# Patient Record
Sex: Female | Born: 1940 | Race: White | Hispanic: No | Smoking: Never smoker
Health system: Southern US, Community
[De-identification: ages and names within clinical notes are randomized; demographics above are authoritative.]

## PROBLEM LIST (undated history)

## (undated) DIAGNOSIS — Z973 Presence of spectacles and contact lenses: Secondary | ICD-10-CM

## (undated) DIAGNOSIS — E119 Type 2 diabetes mellitus without complications: Secondary | ICD-10-CM

## (undated) DIAGNOSIS — R519 Headache, unspecified: Secondary | ICD-10-CM

## (undated) DIAGNOSIS — K5909 Other constipation: Secondary | ICD-10-CM

## (undated) DIAGNOSIS — G8929 Other chronic pain: Secondary | ICD-10-CM

## (undated) DIAGNOSIS — N8110 Cystocele, unspecified: Secondary | ICD-10-CM

## (undated) DIAGNOSIS — J4540 Moderate persistent asthma, uncomplicated: Secondary | ICD-10-CM

## (undated) DIAGNOSIS — K449 Diaphragmatic hernia without obstruction or gangrene: Secondary | ICD-10-CM

## (undated) DIAGNOSIS — N812 Incomplete uterovaginal prolapse: Secondary | ICD-10-CM

## (undated) DIAGNOSIS — K219 Gastro-esophageal reflux disease without esophagitis: Secondary | ICD-10-CM

## (undated) DIAGNOSIS — R51 Headache: Secondary | ICD-10-CM

## (undated) DIAGNOSIS — M1990 Unspecified osteoarthritis, unspecified site: Secondary | ICD-10-CM

## (undated) DIAGNOSIS — Z9109 Other allergy status, other than to drugs and biological substances: Secondary | ICD-10-CM

## (undated) DIAGNOSIS — F32A Depression, unspecified: Secondary | ICD-10-CM

## (undated) DIAGNOSIS — S22080A Wedge compression fracture of T11-T12 vertebra, initial encounter for closed fracture: Secondary | ICD-10-CM

## (undated) DIAGNOSIS — J3089 Other allergic rhinitis: Secondary | ICD-10-CM

## (undated) DIAGNOSIS — J45909 Unspecified asthma, uncomplicated: Secondary | ICD-10-CM

## (undated) DIAGNOSIS — F329 Major depressive disorder, single episode, unspecified: Secondary | ICD-10-CM

## (undated) DIAGNOSIS — Z8669 Personal history of other diseases of the nervous system and sense organs: Secondary | ICD-10-CM

## (undated) DIAGNOSIS — T7840XA Allergy, unspecified, initial encounter: Secondary | ICD-10-CM

## (undated) DIAGNOSIS — E785 Hyperlipidemia, unspecified: Secondary | ICD-10-CM

## (undated) DIAGNOSIS — G43909 Migraine, unspecified, not intractable, without status migrainosus: Secondary | ICD-10-CM

## (undated) DIAGNOSIS — M8080XA Other osteoporosis with current pathological fracture, unspecified site, initial encounter for fracture: Secondary | ICD-10-CM

## (undated) DIAGNOSIS — J309 Allergic rhinitis, unspecified: Secondary | ICD-10-CM

## (undated) DIAGNOSIS — I10 Essential (primary) hypertension: Secondary | ICD-10-CM

## (undated) DIAGNOSIS — E039 Hypothyroidism, unspecified: Secondary | ICD-10-CM

## (undated) DIAGNOSIS — H3530 Unspecified macular degeneration: Secondary | ICD-10-CM

## (undated) HISTORY — DX: Gastro-esophageal reflux disease without esophagitis: K21.9

## (undated) HISTORY — PX: LAPAROSCOPY ABDOMEN DIAGNOSTIC: PRO50

## (undated) HISTORY — DX: Depression, unspecified: F32.A

## (undated) HISTORY — DX: Headache, unspecified: R51.9

## (undated) HISTORY — DX: Allergic rhinitis, unspecified: J30.9

## (undated) HISTORY — DX: Type 2 diabetes mellitus without complications: E11.9

## (undated) HISTORY — DX: Allergy, unspecified, initial encounter: T78.40XA

## (undated) HISTORY — DX: Unspecified osteoarthritis, unspecified site: M19.90

## (undated) HISTORY — DX: Hypothyroidism, unspecified: E03.9

## (undated) HISTORY — DX: Headache: R51

## (undated) HISTORY — PX: BACK SURGERY: SHX140

## (undated) HISTORY — DX: Unspecified asthma, uncomplicated: J45.909

## (undated) HISTORY — DX: Other osteoporosis with current pathological fracture, unspecified site, initial encounter for fracture: M80.80XA

## (undated) HISTORY — PX: COLONOSCOPY: SHX174

## (undated) HISTORY — DX: Other chronic pain: G89.29

## (undated) HISTORY — DX: Diaphragmatic hernia without obstruction or gangrene: K44.9

## (undated) HISTORY — DX: Essential (primary) hypertension: I10

## (undated) HISTORY — DX: Other allergy status, other than to drugs and biological substances: Z91.09

## (undated) HISTORY — DX: Hyperlipidemia, unspecified: E78.5

## (undated) HISTORY — DX: Major depressive disorder, single episode, unspecified: F32.9

## (undated) HISTORY — DX: Personal history of other diseases of the nervous system and sense organs: Z86.69

## (undated) HISTORY — PX: EYE SURGERY: SHX253

## (undated) HISTORY — DX: Wedge compression fracture of t11-T12 vertebra, initial encounter for closed fracture: S22.080A

---

## 1960-02-23 HISTORY — PX: TONSILLECTOMY AND ADENOIDECTOMY: SUR1326

## 1976-02-23 HISTORY — PX: DILATION AND CURETTAGE OF UTERUS: SHX78

## 1999-03-16 ENCOUNTER — Encounter: Payer: Self-pay | Admitting: Internal Medicine

## 1999-03-16 ENCOUNTER — Other Ambulatory Visit: Admission: RE | Admit: 1999-03-16 | Discharge: 1999-03-16 | Payer: Self-pay | Admitting: Internal Medicine

## 1999-03-16 ENCOUNTER — Encounter: Admission: RE | Admit: 1999-03-16 | Discharge: 1999-03-16 | Payer: Self-pay | Admitting: Internal Medicine

## 2000-03-17 ENCOUNTER — Encounter: Admission: RE | Admit: 2000-03-17 | Discharge: 2000-03-17 | Payer: Self-pay | Admitting: Internal Medicine

## 2000-03-17 ENCOUNTER — Encounter: Payer: Self-pay | Admitting: Internal Medicine

## 2000-10-21 ENCOUNTER — Encounter: Admission: RE | Admit: 2000-10-21 | Discharge: 2000-10-21 | Payer: Self-pay | Admitting: Internal Medicine

## 2000-10-21 ENCOUNTER — Encounter: Payer: Self-pay | Admitting: Internal Medicine

## 2001-02-22 HISTORY — PX: CATARACT EXTRACTION W/ INTRAOCULAR LENS IMPLANT: SHX1309

## 2001-02-22 HISTORY — PX: CATARACT EXTRACTION: SUR2

## 2001-03-27 ENCOUNTER — Encounter: Payer: Self-pay | Admitting: Internal Medicine

## 2001-03-27 ENCOUNTER — Encounter: Admission: RE | Admit: 2001-03-27 | Discharge: 2001-03-27 | Payer: Self-pay | Admitting: Internal Medicine

## 2002-02-22 DIAGNOSIS — E119 Type 2 diabetes mellitus without complications: Secondary | ICD-10-CM

## 2002-02-22 HISTORY — DX: Type 2 diabetes mellitus without complications: E11.9

## 2002-05-09 ENCOUNTER — Encounter: Admission: RE | Admit: 2002-05-09 | Discharge: 2002-05-09 | Payer: Self-pay | Admitting: Internal Medicine

## 2002-05-09 ENCOUNTER — Encounter: Payer: Self-pay | Admitting: Internal Medicine

## 2002-11-06 ENCOUNTER — Encounter: Admission: RE | Admit: 2002-11-06 | Discharge: 2003-02-04 | Payer: Self-pay | Admitting: Internal Medicine

## 2003-05-14 ENCOUNTER — Encounter: Admission: RE | Admit: 2003-05-14 | Discharge: 2003-05-14 | Payer: Self-pay | Admitting: Internal Medicine

## 2004-06-18 ENCOUNTER — Other Ambulatory Visit: Admission: RE | Admit: 2004-06-18 | Discharge: 2004-06-18 | Payer: Self-pay | Admitting: Internal Medicine

## 2004-07-03 ENCOUNTER — Encounter: Admission: RE | Admit: 2004-07-03 | Discharge: 2004-07-03 | Payer: Self-pay | Admitting: Internal Medicine

## 2005-08-05 ENCOUNTER — Encounter: Admission: RE | Admit: 2005-08-05 | Discharge: 2005-08-05 | Payer: Self-pay | Admitting: Internal Medicine

## 2006-09-23 ENCOUNTER — Encounter: Admission: RE | Admit: 2006-09-23 | Discharge: 2006-09-23 | Payer: Self-pay | Admitting: Internal Medicine

## 2007-06-06 ENCOUNTER — Other Ambulatory Visit: Admission: RE | Admit: 2007-06-06 | Discharge: 2007-06-06 | Payer: Self-pay | Admitting: Internal Medicine

## 2007-09-25 ENCOUNTER — Encounter: Admission: RE | Admit: 2007-09-25 | Discharge: 2007-09-25 | Payer: Self-pay | Admitting: Internal Medicine

## 2007-12-08 ENCOUNTER — Encounter: Admission: RE | Admit: 2007-12-08 | Discharge: 2007-12-08 | Payer: Self-pay | Admitting: Allergy and Immunology

## 2008-01-23 LAB — PULMONARY FUNCTION TEST

## 2008-05-23 HISTORY — PX: OTHER SURGICAL HISTORY: SHX169

## 2008-09-13 ENCOUNTER — Other Ambulatory Visit: Admission: RE | Admit: 2008-09-13 | Discharge: 2008-09-13 | Payer: Self-pay | Admitting: Internal Medicine

## 2008-11-15 ENCOUNTER — Encounter: Admission: RE | Admit: 2008-11-15 | Discharge: 2008-11-15 | Payer: Self-pay | Admitting: Internal Medicine

## 2010-07-30 LAB — LIPID PANEL
Cholesterol: 254 mg/dL — AB (ref 0–200)
Direct LDL: 170
HDL: 44 mg/dL (ref 35–70)
Triglycerides: 129

## 2010-12-16 LAB — VITAMIN D 25 HYDROXY (VIT D DEFICIENCY, FRACTURES): Vit D, 25-Hydroxy: 28.2

## 2010-12-16 LAB — COMPREHENSIVE METABOLIC PANEL
ALT: 18 U/L (ref 7–35)
AST: 20 U/L
Alkaline Phosphatase: 65 U/L
Total Bilirubin: 0.6 mg/dL

## 2010-12-16 LAB — TSH: TSH: 5.91

## 2010-12-16 LAB — FERRITIN: Ferritin: 57.1 ng/mL (ref 9.0–150.0)

## 2010-12-16 LAB — CBC
Hemoglobin: 14.9 g/dL (ref 12.0–16.0)
WBC: 5
platelet count: 146

## 2010-12-16 LAB — VITAMIN B12: Vitamin B12 Deficiency Panel: 496

## 2010-12-17 ENCOUNTER — Other Ambulatory Visit: Payer: Self-pay | Admitting: Family Medicine

## 2010-12-24 ENCOUNTER — Ambulatory Visit
Admission: RE | Admit: 2010-12-24 | Discharge: 2010-12-24 | Disposition: A | Discharge status: Home / Self Care | Payer: Medicare Other | Source: Ambulatory Visit | Attending: Family Medicine | Admitting: Family Medicine

## 2010-12-24 ENCOUNTER — Other Ambulatory Visit: Payer: Self-pay | Admitting: Family Medicine

## 2011-02-23 DIAGNOSIS — Z8719 Personal history of other diseases of the digestive system: Secondary | ICD-10-CM

## 2011-02-23 HISTORY — DX: Personal history of other diseases of the digestive system: Z87.19

## 2011-03-02 ENCOUNTER — Telehealth: Payer: Self-pay | Admitting: Internal Medicine

## 2011-03-16 NOTE — Telephone Encounter (Signed)
Just received these and they have been put on Dr Regino Schultze desk for review. I have advised [name] at [place] of this.

## 2011-04-02 ENCOUNTER — Encounter: Payer: Self-pay | Admitting: Internal Medicine

## 2011-04-21 LAB — HEMOGLOBIN A1C: A1c: 5.9

## 2011-04-21 LAB — GLUCOSE, FASTING: Glucose: 127

## 2011-04-21 LAB — CREATININE, SERUM: Creat: 0.96

## 2011-05-19 ENCOUNTER — Encounter: Payer: Self-pay | Admitting: *Deleted

## 2011-06-01 ENCOUNTER — Ambulatory Visit: Payer: Medicare Other | Admitting: Internal Medicine

## 2011-06-04 ENCOUNTER — Ambulatory Visit (INDEPENDENT_AMBULATORY_CARE_PROVIDER_SITE_OTHER): Payer: Medicare Other | Admitting: Internal Medicine

## 2011-06-04 ENCOUNTER — Encounter: Payer: Self-pay | Admitting: Internal Medicine

## 2011-06-04 VITALS — BP 132/68 | HR 60 | Ht 69.0 in | Wt 179.0 lb

## 2011-06-04 DIAGNOSIS — K219 Gastro-esophageal reflux disease without esophagitis: Secondary | ICD-10-CM

## 2011-06-04 DIAGNOSIS — R1319 Other dysphagia: Secondary | ICD-10-CM

## 2011-06-04 DIAGNOSIS — R933 Abnormal findings on diagnostic imaging of other parts of digestive tract: Secondary | ICD-10-CM

## 2011-06-04 NOTE — Progress Notes (Signed)
Jordan Patterson 10/12/1940 MRN [id number]    History of Present Illness:  This is a 70-year-old white female with chronic cough and difficulty swallowing, predominantly solids such as meat, bread and pasta. This has been going on for several years. She denies odynophagia. She is having food regurgitation at night and burning in her throat. She underwent an upper endoscopy by Jordan Patterson in October 2010 and it was a normal exam. A barium esophagram at that time showed blunted primary esophageal stripping, prominent tertiary contractions, also gastroesophageal reflux to the level of the upper thoracic esophagus. A 13 mm tablet lodged in the upper thoracic esophagus. She was at one point on Nexium 40 mg twice a day but discontinued it. She subsequently underwent an upper GI series in October 2012 which again showed a nonspecific esophageal motility disorder with occasional disruption of primary wave. There were no tertiary contractions. She had a small hiatal hernia and mild smooth narrowing at the GE junction. The 13 mm tablet passed this time without difficulty.   Past Medical History  Diagnosis Date  . Hiatal hernia   . GERD (gastroesophageal reflux disease)   . Hypertension   . Asthma   . Hyperlipidemia   . Depression   . Hypothyroidism   . Osteopenia   . Diabetes mellitus   . Chronic headaches    Past Surgical History  Procedure Date  . Dilation and curettage of uterus 1978  . Tonsillectomy and adenoidectomy 1962  . Laparoscopy abdomen diagnostic     To R/O endometriosis  . Cardiac catheterization     reports that she has never smoked. She has never used smokeless tobacco. She reports that she does not drink alcohol or use illicit drugs. family history includes Diabetes in her father; Hypertension in her mother; and Mitral valve prolapse in her father and mother.  There is no history of Colon cancer. Allergies  Allergen Reactions  . Ace Inhibitors Cough  . Amlodipine Cough  .  Lyrica   . Tegretol (Carbamazepine)     Dizziness, headache  . Wellbutrin (Bupropion Hcl)   . Cymbalta (Duloxetine Hcl) Palpitations    And headaches        Review of Systems: Denies shortness of breath worse meds. No change in bowel habits  The remainder of the 10 point ROS is negative except as outlined in H&P   Physical Exam: General appearance  Well developed, in no distress. Eyes- non icteric. HEENT nontraumatic, normocephalic. Normal voice occasional cough Mouth no lesions, tongue papillated, no cheilosis. Neck supple without adenopathy, thyroid not enlarged, no carotid bruits, no JVD. Lungs Clear to auscultation bilaterally. Cor normal S1, normal S2, regular rhythm, no murmur,  quiet precordium. Abdomen: Soft nontender with normoactive bowel sounds. No scars. Liver edge at costal margin. Rectal: Not done. Extremities no pedal edema. Skin no lesions. Neurological alert and oriented x 3. Psychological normal mood and affect.  Assessment and Plan:  Problem #1 Chronic low-grade dysphagia, predominantly to solids. Barium studies on 2 occasions showed esophageal dysmotility and most recently some tapering of the distal esophagus either due to spasm or mild esophageal stricture.The esophagram does not suggest achalasia She thinks that her symptoms are getting worse and more frequent. We will proceed with an upper endoscopy and esophageal dilation. We will obtain biopsies to rule out Barrett's esophagus. Some of her symptoms are suggestive of LPR. That may have to be evaluated as well. She may eventually have to go back on high-dose PPIs which she was   reluctant to take long-term. I have instructed her on antireflux measures.   06/04/2011 Jordan Patterson  

## 2011-06-04 NOTE — Patient Instructions (Signed)
You have been scheduled for an endoscopy with propofol. Please follow written instructions given to you at your visit today. CC: Dr Merri Brunette

## 2011-06-25 ENCOUNTER — Encounter (HOSPITAL_COMMUNITY)
Admission: RE | Disposition: A | Discharge status: Hospice | Payer: Self-pay | Source: Ambulatory Visit | Attending: Internal Medicine

## 2011-06-25 ENCOUNTER — Ambulatory Visit (HOSPITAL_COMMUNITY)
Admission: RE | Admit: 2011-06-25 | Discharge: 2011-06-25 | Disposition: A | Discharge status: Hospice | Payer: Medicare Other | Source: Ambulatory Visit | Attending: Internal Medicine | Admitting: Internal Medicine

## 2011-06-25 ENCOUNTER — Encounter (HOSPITAL_COMMUNITY): Payer: Self-pay | Admitting: *Deleted

## 2011-06-25 DIAGNOSIS — E785 Hyperlipidemia, unspecified: Secondary | ICD-10-CM | POA: Insufficient documentation

## 2011-06-25 DIAGNOSIS — R1310 Dysphagia, unspecified: Secondary | ICD-10-CM | POA: Insufficient documentation

## 2011-06-25 DIAGNOSIS — R1319 Other dysphagia: Secondary | ICD-10-CM

## 2011-06-25 DIAGNOSIS — J45909 Unspecified asthma, uncomplicated: Secondary | ICD-10-CM | POA: Insufficient documentation

## 2011-06-25 DIAGNOSIS — K449 Diaphragmatic hernia without obstruction or gangrene: Secondary | ICD-10-CM | POA: Insufficient documentation

## 2011-06-25 DIAGNOSIS — E039 Hypothyroidism, unspecified: Secondary | ICD-10-CM | POA: Insufficient documentation

## 2011-06-25 DIAGNOSIS — M949 Disorder of cartilage, unspecified: Secondary | ICD-10-CM | POA: Insufficient documentation

## 2011-06-25 DIAGNOSIS — K219 Gastro-esophageal reflux disease without esophagitis: Secondary | ICD-10-CM | POA: Insufficient documentation

## 2011-06-25 DIAGNOSIS — K224 Dyskinesia of esophagus: Secondary | ICD-10-CM | POA: Insufficient documentation

## 2011-06-25 DIAGNOSIS — M899 Disorder of bone, unspecified: Secondary | ICD-10-CM | POA: Insufficient documentation

## 2011-06-25 DIAGNOSIS — E119 Type 2 diabetes mellitus without complications: Secondary | ICD-10-CM | POA: Insufficient documentation

## 2011-06-25 DIAGNOSIS — I10 Essential (primary) hypertension: Secondary | ICD-10-CM | POA: Insufficient documentation

## 2011-06-25 HISTORY — DX: Unspecified macular degeneration: H35.30

## 2011-06-25 HISTORY — PX: SAVORY DILATION: SHX5439

## 2011-06-25 HISTORY — PX: ESOPHAGOGASTRODUODENOSCOPY: SHX5428

## 2011-06-25 SURGERY — EGD (ESOPHAGOGASTRODUODENOSCOPY)
Anesthesia: Moderate Sedation

## 2011-06-25 MED ORDER — MIDAZOLAM HCL 10 MG/2ML IJ SOLN
INTRAMUSCULAR | Status: DC | PRN
  Administered 2011-06-25 (×2): 2 mg via INTRAVENOUS

## 2011-06-25 MED ORDER — FENTANYL CITRATE 0.05 MG/ML IJ SOLN
INTRAMUSCULAR | Status: AC
  Filled 2011-06-25: qty 2

## 2011-06-25 MED ORDER — SODIUM CHLORIDE 0.9 % IV SOLN
INTRAVENOUS | Status: DC
  Administered 2011-06-25: 08:00:00 via INTRAVENOUS

## 2011-06-25 MED ORDER — MIDAZOLAM HCL 10 MG/2ML IJ SOLN
INTRAMUSCULAR | Status: AC
  Filled 2011-06-25: qty 2

## 2011-06-25 MED ORDER — BUTAMBEN-TETRACAINE-BENZOCAINE 2-2-14 % EX AERO
INHALATION_SPRAY | CUTANEOUS | Status: DC | PRN
  Administered 2011-06-25: 2 via TOPICAL

## 2011-06-25 MED ORDER — METOCLOPRAMIDE HCL 5 MG PO TABS: 5.0000 mg | ORAL_TABLET | Freq: Every evening | ORAL | Status: DC | PRN

## 2011-06-25 MED ORDER — FENTANYL NICU IV SYRINGE 50 MCG/ML
INJECTION | INTRAMUSCULAR | Status: DC | PRN
  Administered 2011-06-25 (×2): 25 ug via INTRAVENOUS

## 2011-06-25 NOTE — Op Note (Signed)
[facility] [address] [place], [place]  [address]  ENDOSCOPY PROCEDURE REPORT  PATIENT:  Jordan Patterson, Jordan Patterson  MR#:  [id number] BIRTHDATE:  Jan 27, 1941, 70 yrs. old  GENDER:  female  ENDOSCOPIST:  Hedwig Morton. [name], MD Referred by:  Merri Brunette, M.D.  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  The EG-[id number] ([id number]) endoscope was introduced through the mouth and advanced to the second portion of the duodenum, without limitations.  The instrument was slowly withdrawn as the mucosa was fully examined. <<PROCEDUREIMAGES>>  nl esophagus (see image7). no evidence of a stricture,  irregular Z-line. With standard forceps, a biopsy was obtained and sent to pathology (see image6). Savary dilation over a guidewire 17 mm dilator passed over the guidewire  Otherwise the examination was normal (see image1, image2, image3, image4, and image5). Retroflexed views revealed no abnormalities.    The scope was then withdrawn from the patient and the procedure completed.  COMPLICATIONS:  None  ENDOSCOPIC IMPRESSION: 1) Nl esophagus 2) Irregular Z-line 3) Otherwise normal examination s/p passage of 17 mm Savary dilator RECOMMENDATIONS: 1) Await biopsy results 2) Anti-reflux regimen to be follow dysphagia caused by dismotility and by reflux, will switch Prilosec to hs dose, also consider Reglan 5 mg hs  REPEAT EXAM:  In 0 year(s)  for.  ______________________________ Hedwig Morton. [name], MD  CC:  n. eSIGNED:   Hedwig Morton. Blonnie Maske at 06/25/2011 09:07 AM  [name], [phone]

## 2011-06-25 NOTE — H&P (View-Only) (Signed)
Jordan Patterson 1940/03/28 MRN [id number]    History of Present Illness:  This is a 71 year old white female with chronic cough and difficulty swallowing, predominantly solids such as meat, bread and pasta. This has been going on for several years. She denies odynophagia. She is having food regurgitation at night and burning in her throat. She underwent an upper endoscopy by Dr. Laural Benes in October 2010 and it was a normal exam. A barium esophagram at that time showed blunted primary esophageal stripping, prominent tertiary contractions, also gastroesophageal reflux to the level of the upper thoracic esophagus. A 13 mm tablet lodged in the upper thoracic esophagus. She was at one point on Nexium 40 mg twice a day but discontinued it. She subsequently underwent an upper GI series in October 2012 which again showed a nonspecific esophageal motility disorder with occasional disruption of primary wave. There were no tertiary contractions. She had a small hiatal hernia and mild smooth narrowing at the GE junction. The 13 mm tablet passed this time without difficulty.   Past Medical History  Diagnosis Date  . Hiatal hernia   . GERD (gastroesophageal reflux disease)   . Hypertension   . Asthma   . Hyperlipidemia   . Depression   . Hypothyroidism   . Osteopenia   . Diabetes mellitus   . Chronic headaches    Past Surgical History  Procedure Date  . Dilation and curettage of uterus 1978  . Tonsillectomy and adenoidectomy 1962  . Laparoscopy abdomen diagnostic     To R/O endometriosis  . Cardiac catheterization     reports that she has never smoked. She has never used smokeless tobacco. She reports that she does not drink alcohol or use illicit drugs. family history includes Diabetes in her father; Hypertension in her mother; and Mitral valve prolapse in her father and mother.  There is no history of Colon cancer. Allergies  Allergen Reactions  . Ace Inhibitors Cough  . Amlodipine Cough  .  Lyrica   . Tegretol (Carbamazepine)     Dizziness, headache  . Wellbutrin (Bupropion Hcl)   . Cymbalta (Duloxetine Hcl) Palpitations    And headaches        Review of Systems: Denies shortness of breath worse meds. No change in bowel habits  The remainder of the 10 point ROS is negative except as outlined in H&P   Physical Exam: General appearance  Well developed, in no distress. Eyes- non icteric. HEENT nontraumatic, normocephalic. Normal voice occasional cough Mouth no lesions, tongue papillated, no cheilosis. Neck supple without adenopathy, thyroid not enlarged, no carotid bruits, no JVD. Lungs Clear to auscultation bilaterally. Cor normal S1, normal S2, regular rhythm, no murmur,  quiet precordium. Abdomen: Soft nontender with normoactive bowel sounds. No scars. Liver edge at costal margin. Rectal: Not done. Extremities no pedal edema. Skin no lesions. Neurological alert and oriented x 3. Psychological normal mood and affect.  Assessment and Plan:  Problem #1 Chronic low-grade dysphagia, predominantly to solids. Barium studies on 2 occasions showed esophageal dysmotility and most recently some tapering of the distal esophagus either due to spasm or mild esophageal stricture.The esophagram does not suggest achalasia She thinks that her symptoms are getting worse and more frequent. We will proceed with an upper endoscopy and esophageal dilation. We will obtain biopsies to rule out Barrett's esophagus. Some of her symptoms are suggestive of LPR. That may have to be evaluated as well. She may eventually have to go back on high-dose PPIs which she was  reluctant to take long-term. I have instructed her on antireflux measures.   06/04/2011 Jordan Patterson

## 2011-06-25 NOTE — Discharge Instructions (Signed)
Start using Omeprazole at bedtime , also start taking Reglan 5 mg at bedtime  Elevate head of bed at night

## 2011-06-25 NOTE — Interval H&P Note (Signed)
History and Physical Interval Note:  06/25/2011 8:39 AM  Jordan Patterson  has presented today for surgery, with the diagnosis of gerd, dysphagia  The various methods of treatment have been discussed with the patient and family. After consideration of risks, benefits and other options for treatment, the patient has consented to  Procedure(s) (LRB): ESOPHAGOGASTRODUODENOSCOPY (EGD) (N/A) SAVORY DILATION (N/A) as a surgical intervention .  The patients' history has been reviewed, patient examined, no change in status, stable for surgery.  I have reviewed the patients' chart and labs.  Questions were answered to the patient's satisfaction.     [name]

## 2011-06-28 ENCOUNTER — Encounter (HOSPITAL_COMMUNITY): Payer: Self-pay | Admitting: Internal Medicine

## 2011-06-29 ENCOUNTER — Encounter: Payer: Self-pay | Admitting: Internal Medicine

## 2011-07-13 ENCOUNTER — Encounter: Payer: Self-pay | Admitting: *Deleted

## 2011-08-03 ENCOUNTER — Ambulatory Visit (INDEPENDENT_AMBULATORY_CARE_PROVIDER_SITE_OTHER): Payer: Medicare Other | Admitting: Internal Medicine

## 2011-08-03 ENCOUNTER — Encounter: Payer: Self-pay | Admitting: Internal Medicine

## 2011-08-03 VITALS — BP 132/64 | HR 60 | Ht 69.0 in | Wt 176.0 lb

## 2011-08-03 DIAGNOSIS — K219 Gastro-esophageal reflux disease without esophagitis: Secondary | ICD-10-CM

## 2011-08-03 NOTE — Progress Notes (Signed)
Jordan Patterson 03/08/1940 MRN [id number]  History of Present Illness:  This is a 71 year old white female with chronic gastroesophageal reflux and nonspecific esophageal dysmotility on a barium esophagram in 2010 as well as on a recent upper GI series done in October 2012. She just underwent an upper endoscopy and esophageal dilatation for dysphagia and was found to have an irregular Z line. Biopsies showed reflux related injury. There were a few eosinophils on the biopsies. She was dilated with a 17 mm dilator. She feels much improved today although not completely well. She takes Prilosec 20 mg every morning but has not tried Reglan 5 mg. There has been occasional dysphagia related to esophageal dysmotility.   Past Medical History  Diagnosis Date  . Hiatal hernia   . GERD (gastroesophageal reflux disease)   . Hypertension   . Asthma   . Hyperlipidemia   . Depression   . Hypothyroidism   . Osteopenia   . Diabetes mellitus   . Chronic headaches   . Macular degeneration    Past Surgical History  Procedure Date  . Dilation and curettage of uterus 1978  . Tonsillectomy and adenoidectomy 1962  . Laparoscopy abdomen diagnostic     To R/O endometriosis  . Esophagogastroduodenoscopy 06/25/2011    Procedure: ESOPHAGOGASTRODUODENOSCOPY (EGD);  Surgeon: Hart Carwin, MD;  Location: [facility];  Service: Endoscopy;  Laterality: N/A;  no Xray  . Savory dilation 06/25/2011    Procedure: SAVORY DILATION;  Surgeon: Hart Carwin, MD;  Location: [facility];  Service: Endoscopy;  Laterality: N/A;    reports that she has never smoked. She has never used smokeless tobacco. She reports that she does not drink alcohol or use illicit drugs. family history includes Diabetes in her father; Hypertension in her mother; and Mitral valve prolapse in her father and mother.  There is no history of Colon cancer. Allergies  Allergen Reactions  . Ace Inhibitors Cough  . Amlodipine Cough  . Pregabalin   .  Tegretol (Carbamazepine)     Dizziness, headache  . Wellbutrin (Bupropion Hcl)   . Cymbalta (Duloxetine Hcl) Palpitations    And headaches        Review of Systems: Negative for odynophagia or shortness of breath or chest pain  The remainder of the 10 point ROS is negative except as outlined in H&P   Physical Exam: General appearance  Well developed, in no distress. Psychological normal mood and affect.  Assessment and Plan:  Problem #1 Chronic gastroesophageal reflux disease with mild esophageal dysmotility and small hiatal hernia. There is no evidence of Barrett's esophagus. She is improved on Prilosec 20 mg daily. She will try Reglan before meals and continue on antireflux measures. We have discussed the possibility of Nissen fundoplication. She is not interested at this time. I suggest she increase Prilosec to 2 a day as needed. I would like to see her in 6 months.  Problem #2 Colorectal screening. I suggest a screening colonoscopy at her convenience.  08/03/2011 [name]

## 2011-08-03 NOTE — Patient Instructions (Addendum)
We have given you samples of Align. This puts good bacteria back into your colon. You should take 1 capsule by mouth once daily. If this works well for you, it can be purchased over the counter. CC: Dr Merri Brunette

## 2011-09-27 ENCOUNTER — Other Ambulatory Visit: Payer: Self-pay | Admitting: *Deleted

## 2011-09-27 MED ORDER — METOCLOPRAMIDE HCL 5 MG PO TABS: 5.0000 mg | ORAL_TABLET | Freq: Every evening | ORAL | Status: DC | PRN

## 2011-10-21 ENCOUNTER — Ambulatory Visit (INDEPENDENT_AMBULATORY_CARE_PROVIDER_SITE_OTHER): Payer: Medicare Other | Admitting: Family Medicine

## 2011-10-21 ENCOUNTER — Encounter: Payer: Self-pay | Admitting: Family Medicine

## 2011-10-21 ENCOUNTER — Ambulatory Visit: Payer: Medicare Other | Admitting: Internal Medicine

## 2011-10-21 VITALS — BP 142/90 | HR 76 | Temp 98.4°F | Ht 69.0 in | Wt 166.8 lb

## 2011-10-21 DIAGNOSIS — M8580 Other specified disorders of bone density and structure, unspecified site: Secondary | ICD-10-CM

## 2011-10-21 DIAGNOSIS — E785 Hyperlipidemia, unspecified: Secondary | ICD-10-CM

## 2011-10-21 DIAGNOSIS — M129 Arthropathy, unspecified: Secondary | ICD-10-CM

## 2011-10-21 DIAGNOSIS — M8080XA Other osteoporosis with current pathological fracture, unspecified site, initial encounter for fracture: Secondary | ICD-10-CM | POA: Insufficient documentation

## 2011-10-21 DIAGNOSIS — M899 Disorder of bone, unspecified: Secondary | ICD-10-CM

## 2011-10-21 DIAGNOSIS — E039 Hypothyroidism, unspecified: Secondary | ICD-10-CM

## 2011-10-21 DIAGNOSIS — F32A Depression, unspecified: Secondary | ICD-10-CM | POA: Insufficient documentation

## 2011-10-21 DIAGNOSIS — E1169 Type 2 diabetes mellitus with other specified complication: Secondary | ICD-10-CM | POA: Insufficient documentation

## 2011-10-21 DIAGNOSIS — I10 Essential (primary) hypertension: Secondary | ICD-10-CM

## 2011-10-21 DIAGNOSIS — F329 Major depressive disorder, single episode, unspecified: Secondary | ICD-10-CM

## 2011-10-21 DIAGNOSIS — K219 Gastro-esophageal reflux disease without esophagitis: Secondary | ICD-10-CM

## 2011-10-21 DIAGNOSIS — K449 Diaphragmatic hernia without obstruction or gangrene: Secondary | ICD-10-CM | POA: Insufficient documentation

## 2011-10-21 DIAGNOSIS — M1990 Unspecified osteoarthritis, unspecified site: Secondary | ICD-10-CM | POA: Insufficient documentation

## 2011-10-21 DIAGNOSIS — E119 Type 2 diabetes mellitus without complications: Secondary | ICD-10-CM | POA: Insufficient documentation

## 2011-10-21 MED ORDER — OLMESARTAN MEDOXOMIL-HCTZ 40-25 MG PO TABS: 1.0000 | ORAL_TABLET | Freq: Every day | ORAL | Status: DC

## 2011-10-21 NOTE — Assessment & Plan Note (Signed)
Sounds like diet controlled. Recheck when returns fasting prior to CPE.

## 2011-10-21 NOTE — Assessment & Plan Note (Signed)
Stable on omeprazole and align.  Continue.

## 2011-10-21 NOTE — Assessment & Plan Note (Signed)
Good control as evidenced by recent blood work. Have requested records from prior PCP. Check A1c next visit. ophtho exam 02/2011.  Foot exam today.

## 2011-10-21 NOTE — Patient Instructions (Signed)
Return at your convenience (approx 3 mo) fasting for blood work, afterwards for [employer] visit. Good to meet you today, call us with quesitons. We will request records from Dr. Katrinka Blazing.

## 2011-10-21 NOTE — Assessment & Plan Note (Signed)
Check TSH next blood draw. 

## 2011-10-21 NOTE — Assessment & Plan Note (Signed)
Chronic, stable. Continue med.  Shows great numbers at home, some lows.  Recently changed from coreg 6.25mg  bid to 3.125mg  bid.

## 2011-10-21 NOTE — Progress Notes (Signed)
Subjective:    Patient ID: Jordan Patterson, female    DOB: Mar 07, 1940, 71 y.o.   MRN: [id number]  HPI CC: new pt to establish  Prior saw Dr. Severiano Gilbert at [facility].  Wanted site closer to home.  DM - 02/2011 - Dr. Sherryle Lis eye exam.  Unsure last A1c.  Brings log of sugars ranging <120, most low 100s.  Pneumonia shot - doesn't think has received.  Does get flu shots.    HTN - benicar HCT and coreg.  Doing well.    Macular degeneration - [facility].  On [employer], losing a few pounds.  Preventative: Last CPE unsure, doesn't think had physical recently.  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  Past histories reviewed and updated if relevant as below. Patient Active Problem List  Diagnosis  . Esophageal reflux  . Other dysphagia   Past Medical History  Diagnosis Date  . Hiatal hernia   . GERD (gastroesophageal reflux disease)     ([place])  . Hypertension   . History of asthma   . Hyperlipidemia     mild, diet controlled  . Depression   . Hypothyroidism   . Osteopenia   . Diabetes type 2, controlled 2004    diet controlled  . Macular degeneration   . Arthritis     hands and knees  . Environmental allergies     dust,mold,mildew  . Hx of migraines   . Chronic headaches    Past Surgical History  Procedure Date  . Dilation and curettage of uterus 1978  . Tonsillectomy and adenoidectomy 1962  . Laparoscopy abdomen diagnostic     To R/O endometriosis  . Esophagogastroduodenoscopy 06/25/2011    Procedure: ESOPHAGOGASTRODUODENOSCOPY (EGD);  Surgeon: Hart Carwin, MD;  Location: [facility];  Service: Endoscopy;  Laterality: N/A;  no Xray  . Savory dilation 06/25/2011    Procedure: SAVORY DILATION;  Surgeon: Hart Carwin, MD;  Location: [facility];  Service: Endoscopy;  Laterality: N/A;  . Cataract extraction 2003    bilateral   History  Substance Use Topics  . Smoking status: Never Smoker   . Smokeless tobacco: Never Used  . Alcohol Use: No   Family History  Problem Relation Age of Onset  . Mitral valve prolapse Father   . Mitral valve prolapse Mother   . Hypertension Mother   . Diabetes Father   . Stroke Mother 73    after valve surgery  . Stroke Paternal Grandfather   . Coronary artery disease Maternal Grandfather   . Cancer Neg Hx    Allergies  Allergen Reactions  . Ace Inhibitors Cough  . Amlodipine Cough  . Pregabalin   . Tegretol (Carbamazepine)     Dizziness, headache  . Wellbutrin (Bupropion Hcl)   . Cymbalta (Duloxetine Hcl) Palpitations    And headaches   Current Outpatient Prescriptions on File Prior to Visit  Medication Sig Dispense Refill  . AMBULATORY NON FORMULARY MEDICATION [facility] As directed      . carvedilol (COREG) 6.25 MG tablet Take 3.125 mg by mouth 2 (two) times daily with a meal.       . Chlorpheniramine Maleate (ALLERGY PO) Take by mouth as directed.      . Cholecalciferol (VITAMIN D-3 PO) Take 4,000 mg by mouth  daily.      . Cinnamon 500 MG TABS Take 1 tablet by mouth 2 (two) times daily.       . Coenzyme Q10 (COQ10) 100 MG CAPS Take 1 capsule by mouth daily.      . cycloSPORINE (RESTASIS) 0.05 % ophthalmic emulsion Place 1 drop into both eyes 2 (two) times daily.       Marland Kitchen levothyroxine (SYNTHROID, LEVOTHROID) 88 MCG tablet Take 88 mcg by mouth daily.      Marland Kitchen loteprednol (LOTEMAX) 0.5 % ophthalmic suspension Apply 1 drop to eye as needed.       Marland Kitchen omeprazole (PRILOSEC OTC) 20 MG tablet Take 20 mg by mouth daily.      Gerarda Fraction Root 500 MG CAPS Take 1 capsule by mouth 2 (two) times daily.      Marland Kitchen DISCONTD: olmesartan-hydrochlorothiazide (BENICAR HCT) 40-25 MG per tablet Take 1 tablet by mouth daily.         Review of Systems  Constitutional:  Negative for fever, chills, activity change, appetite change, fatigue and unexpected weight change.  HENT: Negative for hearing loss and neck pain.   Eyes: Negative for visual disturbance.  Respiratory: Negative for cough, chest tightness, shortness of breath and wheezing.   Cardiovascular: Negative for chest pain, palpitations and leg swelling.  Gastrointestinal: Negative for nausea, vomiting, abdominal pain, diarrhea, constipation, blood in stool and abdominal distention.  Genitourinary: Negative for hematuria and difficulty urinating.  Musculoskeletal: Negative for myalgias and arthralgias.  Skin: Negative for rash.  Neurological: Negative for dizziness, seizures, syncope and headaches.  Hematological: Does not bruise/bleed easily.  Psychiatric/Behavioral: Negative for dysphoric mood. The patient is not nervous/anxious.        Objective:   Physical Exam  Nursing note and vitals reviewed. Constitutional: She is oriented to person, place, and time. She appears well-developed and well-nourished. No distress.  HENT:  Head: Normocephalic and atraumatic.  Right Ear: Hearing, tympanic membrane, external ear and ear canal normal.  Left Ear: Hearing, tympanic membrane, external ear and ear canal normal.  Nose: Nose normal.  Mouth/Throat: Oropharynx is clear and moist. No oropharyngeal exudate.  Eyes: Conjunctivae and EOM are normal. Pupils are equal, round, and reactive to light. No scleral icterus.  Neck: Normal range of motion. Neck supple. Carotid bruit is not present.  Cardiovascular: Normal rate, regular rhythm, normal heart sounds and intact distal pulses.   No murmur heard. Pulses:      Radial pulses are 2+ on the right side, and 2+ on the left side.  Pulmonary/Chest: Effort normal and breath sounds normal. No respiratory distress. She has no wheezes. She has no rales.  Abdominal: Soft. Bowel sounds are normal. She exhibits no distension and no mass. There is no tenderness. There is  no rebound and no guarding.  Musculoskeletal: Normal range of motion. She exhibits no edema.       Diabetic foot exam: Normal inspection No skin breakdown No calluses  Normal DP/PT pulses Normal sensation to light touch and monofilament Nails normal  Lymphadenopathy:    She has no cervical adenopathy.  Neurological: She is alert and oriented to person, place, and time.       CN grossly intact, station and gait intact  Skin: Skin is warm and dry. No rash noted.  Psychiatric: She has a normal mood and affect. Her behavior is normal. Judgment and thought content normal.       Assessment & Plan:

## 2011-10-21 NOTE — Assessment & Plan Note (Signed)
Takes vit D 4000 IU daily. Will request records from Dr. Katrinka Blazing, hopeful for DEXA there.

## 2011-11-07 ENCOUNTER — Encounter: Payer: Self-pay | Admitting: Family Medicine

## 2011-11-10 ENCOUNTER — Encounter: Payer: Self-pay | Admitting: Family Medicine

## 2011-11-15 ENCOUNTER — Other Ambulatory Visit: Payer: Self-pay | Admitting: *Deleted

## 2011-11-15 MED ORDER — LEVOTHYROXINE SODIUM 88 MCG PO TABS: 88.0000 ug | ORAL_TABLET | Freq: Every day | ORAL | Status: DC

## 2012-01-06 ENCOUNTER — Other Ambulatory Visit (INDEPENDENT_AMBULATORY_CARE_PROVIDER_SITE_OTHER): Payer: Medicare Other

## 2012-01-06 DIAGNOSIS — E785 Hyperlipidemia, unspecified: Secondary | ICD-10-CM

## 2012-01-06 DIAGNOSIS — E039 Hypothyroidism, unspecified: Secondary | ICD-10-CM

## 2012-01-06 DIAGNOSIS — E119 Type 2 diabetes mellitus without complications: Secondary | ICD-10-CM

## 2012-01-06 DIAGNOSIS — M8580 Other specified disorders of bone density and structure, unspecified site: Secondary | ICD-10-CM

## 2012-01-06 LAB — LIPID PANEL
Cholesterol: 277 mg/dL — ABNORMAL HIGH (ref 0–200)
HDL: 49.9 mg/dL (ref >39.00)
Total CHOL/HDL Ratio: 6
Triglycerides: 121 mg/dL (ref 0.0–149.0)
VLDL: 24.2 mg/dL (ref 0.0–40.0)

## 2012-01-06 LAB — LDL CHOLESTEROL, DIRECT: Direct LDL: 196.3 mg/dL

## 2012-01-06 LAB — TSH: TSH: 1.66 u[IU]/mL (ref 0.35–5.50)

## 2012-01-06 LAB — HEMOGLOBIN A1C: Hgb A1c MFr Bld: 6.2 % (ref 4.6–6.5)

## 2012-01-06 LAB — COMPREHENSIVE METABOLIC PANEL
ALT: 21 U/L (ref 0–35)
AST: 27 U/L (ref 0–37)
Albumin: 4.5 g/dL (ref 3.5–5.2)
Alkaline Phosphatase: 56 U/L (ref 39–117)
BUN: 25 mg/dL — ABNORMAL HIGH (ref 6–23)
CO2: 27 mEq/L (ref 19–32)
Calcium: 9.1 mg/dL (ref 8.4–10.5)
Chloride: 104 mEq/L (ref 96–112)
Creatinine, Ser: 0.9 mg/dL (ref 0.4–1.2)
GFR: 63.12 mL/min (ref >60.00)
Glucose, Bld: 132 mg/dL — ABNORMAL HIGH (ref 70–99)
Potassium: 4.8 mEq/L (ref 3.5–5.1)
Sodium: 140 mEq/L (ref 135–145)
Total Bilirubin: 0.5 mg/dL (ref 0.3–1.2)
Total Protein: 7.5 g/dL (ref 6.0–8.3)

## 2012-01-06 LAB — MICROALBUMIN / CREATININE URINE RATIO
Creatinine,U: 129.8 mg/dL
Microalb Creat Ratio: 0.9 mg/g (ref 0.0–30.0)
Microalb, Ur: 1.2 mg/dL (ref 0.0–1.9)

## 2012-01-07 LAB — VITAMIN D 25 HYDROXY (VIT D DEFICIENCY, FRACTURES): Vit D, 25-Hydroxy: 77 ng/mL (ref 30–89)

## 2012-01-14 ENCOUNTER — Encounter: Payer: Self-pay | Admitting: Family Medicine

## 2012-01-14 ENCOUNTER — Ambulatory Visit (INDEPENDENT_AMBULATORY_CARE_PROVIDER_SITE_OTHER): Payer: Medicare Other | Admitting: Family Medicine

## 2012-01-14 VITALS — BP 132/78 | HR 84 | Temp 98.1°F | Ht 68.0 in | Wt 169.0 lb

## 2012-01-14 DIAGNOSIS — E785 Hyperlipidemia, unspecified: Secondary | ICD-10-CM

## 2012-01-14 DIAGNOSIS — Z23 Encounter for immunization: Secondary | ICD-10-CM

## 2012-01-14 DIAGNOSIS — I10 Essential (primary) hypertension: Secondary | ICD-10-CM

## 2012-01-14 DIAGNOSIS — E119 Type 2 diabetes mellitus without complications: Secondary | ICD-10-CM

## 2012-01-14 DIAGNOSIS — E039 Hypothyroidism, unspecified: Secondary | ICD-10-CM

## 2012-01-14 DIAGNOSIS — M8580 Other specified disorders of bone density and structure, unspecified site: Secondary | ICD-10-CM

## 2012-01-14 DIAGNOSIS — Z0000 Encounter for general adult medical examination without abnormal findings: Secondary | ICD-10-CM

## 2012-01-14 NOTE — Assessment & Plan Note (Signed)
Poor control with diet.  Pt declines statin today.  Discussed importance of LDL control States she will think about statins in future.  Asks about coQ10.

## 2012-01-14 NOTE — Assessment & Plan Note (Signed)
Lab Results  Component Value Date   TSH 1.66 01/06/2012  good levothyroxine dose.

## 2012-01-14 NOTE — Patient Instructions (Addendum)
Call breast center to inquire about when next mammogram is due. Tetanus shot today. Strongly consider restarting crestor as your bad chol was too high.  Continue healthy diet. Decrease vitamin D to 2000 IU daily. Bring me a copy of living will. Return in 6 months to recheck blood pressure and reassess cough.

## 2012-01-14 NOTE — Assessment & Plan Note (Signed)
Well controlled. Continue treatment - likely prediabetic.

## 2012-01-14 NOTE — Assessment & Plan Note (Signed)
Chronic, stable. Self took off coreg, bp staying stable.  Continue only benicar hct.

## 2012-01-14 NOTE — Assessment & Plan Note (Signed)
rec decrease vit D to 2000 IU dialy.

## 2012-01-14 NOTE — Progress Notes (Signed)
Subjective:    Patient ID: Jordan Patterson, female    DOB: 05-29-1940, 71 y.o.   MRN: [id number]  HPI CC: medicare wellness visit  Continues working 3d/wk at [employer].  Currently with L conjunctival hemorrhage.  Wonders if slightly blurry from this.  Present for 2 days.  H/o macular degeneration and dry eyes.  Chronic cough for last several years.  HTN - stopped carvedilol because didn't feel needed.  Denies BP >140/90 at home.  Taking benicar hctz daily.    HLD - prior on crestor.  LDL 196.  DM - states good control of sugars.   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 Annia Friendly, per pt normal.   Mammogram - last in chart 2009.  Pt states done 2012.  Declines breast exam today. Pap - always normal pap smear/pelvic exams.  Prior received by PCP.  Requests deferred. Dexa 2010 osteopenia.  Taking vit D. Flu shot 11/2011 Pneumovax - declines Tetanus - today. Shingles shot - states had shingles.  Declines shingles shot. Advanced directives: has living will at home.  Daughter is HCPOA.  Denies depression/anxiety.  Sad because lonely after widowed.  Denies anhedonia. No falls in last year.  Hearing screen failed.  Has seen audiologist in past.  Did not need hearing aides at that time.  Told not severe hearing loss. Vision screen failed on L eye.  Medications and allergies reviewed and updated in chart.  Past histories reviewed and updated if relevant as below. Patient Active Problem List  Diagnosis  . Hiatal hernia  . GERD (gastroesophageal reflux disease)  . Hypertension  . Hyperlipidemia  . Depression  . Hypothyroidism  . Osteopenia  . Diabetes type 2, controlled  . Arthritis    Past Medical History  Diagnosis Date  . Hiatal hernia   . GERD (gastroesophageal reflux disease)     [name] EGD - mild esophageal dysmotility and small hiatal hernia  . Hypertension   . History of asthma   . Hyperlipidemia     mild, diet controlled  . Depression   . Hypothyroidism   . Osteopenia   . Diabetes type 2, controlled 2004    diet controlled  . Macular degeneration   . Arthritis     hands and knees  . Environmental allergies     dust,mold,mildew  . Hx of migraines   . Chronic headaches    Past Surgical History  Procedure Date  . Dilation and curettage of uterus 1978  . Tonsillectomy and adenoidectomy 1962  . Laparoscopy abdomen diagnostic     To R/O endometriosis  . Esophagogastroduodenoscopy 06/25/2011    Procedure: ESOPHAGOGASTRODUODENOSCOPY (EGD);  Surgeon: Hart Carwin, MD;  Location: [facility];  Service: Endoscopy;  Laterality: N/A;  no Xray  . Savory dilation 06/25/2011    Procedure: SAVORY DILATION;  Surgeon: Hart Carwin, MD;  Location: [facility];  Service: Endoscopy;  Laterality: N/A;  . Cataract extraction 2003    bilateral  . Dexa 2010    improved (initial DEXA mild osteopenia)  . Colonoscopy    History  Substance Use Topics  . Smoking status: Never Smoker   . Smokeless tobacco: Never Used  . Alcohol Use: No   Family History  Problem  Relation Age of Onset  . Mitral valve prolapse Father   . Mitral valve prolapse Mother   . Hypertension Mother   . Diabetes Father   . Stroke Mother 57    after valve surgery  . Stroke Paternal Grandfather   . Coronary artery disease Maternal Grandfather   . Cancer Neg Hx    Allergies  Allergen Reactions  . Ace Inhibitors Cough  . Amlodipine Cough  . Pregabalin   . Tegretol (Carbamazepine)     Dizziness, headache  . Wellbutrin (Bupropion Hcl)   . Cymbalta (Duloxetine Hcl) Palpitations    And headaches   Current Outpatient Prescriptions on File Prior to Visit  Medication Sig Dispense Refill   . AMBULATORY NON FORMULARY MEDICATION [facility] As directed      . Chlorpheniramine Maleate (ALLERGY PO) Take by mouth as directed.      . Cholecalciferol (VITAMIN D-3 PO) Take 4,000 mg by mouth daily.      . Cinnamon 500 MG TABS Take 1 tablet by mouth 2 (two) times daily.       . Coenzyme Q10 (COQ10) 100 MG CAPS Take 1 capsule by mouth daily.      . cycloSPORINE (RESTASIS) 0.05 % ophthalmic emulsion Place 1 drop into both eyes 2 (two) times daily.       Marland Kitchen levothyroxine (SYNTHROID, LEVOTHROID) 88 MCG tablet Take 1 tablet (88 mcg total) by mouth daily.  30 tablet  3  . loteprednol (LOTEMAX) 0.5 % ophthalmic suspension Apply 1 drop to eye as needed.       Marland Kitchen olmesartan-hydrochlorothiazide (BENICAR HCT) 40-25 MG per tablet Take 1 tablet by mouth daily.  30 tablet  11  . omeprazole (PRILOSEC OTC) 20 MG tablet Take 20 mg by mouth daily.      . Probiotic Product (ALIGN) 4 MG CAPS Take 1 capsule by mouth daily.      Gerarda Fraction Root 500 MG CAPS Take 1 capsule by mouth 2 (two) times daily.         Review of Systems  Constitutional: Negative for fever, chills, activity change, appetite change, fatigue and unexpected weight change.  HENT: Negative for hearing loss and neck pain.   Eyes: Negative for visual disturbance.  Respiratory: Negative for cough (chronic), chest tightness, shortness of breath and wheezing.   Cardiovascular: Negative for chest pain, palpitations and leg swelling.  Gastrointestinal: Negative for nausea, vomiting, abdominal pain, diarrhea, constipation, blood in stool and abdominal distention.  Genitourinary: Negative for hematuria and difficulty urinating.  Musculoskeletal: Negative for myalgias and arthralgias.  Skin: Negative for rash.  Neurological: Negative for dizziness, seizures, syncope and headaches.  Hematological: Negative for adenopathy.  Psychiatric/Behavioral: Negative for dysphoric mood. The patient is not nervous/anxious.        Objective:   Physical Exam   Nursing note and vitals reviewed. Constitutional: She is oriented to person, place, and time. She appears well-developed and well-nourished. No distress.  HENT:  Head: Normocephalic and atraumatic.  Right Ear: Hearing, tympanic membrane, external ear and ear canal normal.  Left Ear: Hearing, tympanic membrane, external ear and ear canal normal.  Nose: Nose normal.  Mouth/Throat: Oropharynx is clear and moist. No oropharyngeal exudate.  Eyes: Conjunctivae normal and EOM are normal. Pupils are equal, round, and reactive to light. No scleral icterus.  Neck: Normal range of motion. Neck supple. Carotid bruit is not present.  Cardiovascular: Normal rate, regular rhythm, normal heart sounds and intact distal pulses.   No murmur heard. Pulses:  Radial pulses are 2+ on the right side, and 2+ on the left side.  Pulmonary/Chest: Effort normal and breath sounds normal. No respiratory distress. She has no wheezes. She has no rales.       Breast exam - declined  Abdominal: Soft. Bowel sounds are normal. She exhibits no distension and no mass. There is no tenderness. There is no rebound and no guarding.  Genitourinary:       declined  Musculoskeletal: Normal range of motion. She exhibits no edema.  Lymphadenopathy:    She has no cervical adenopathy.  Neurological: She is alert and oriented to person, place, and time.       CN grossly intact, station and gait intact  Skin: Skin is warm and dry. No rash noted.  Psychiatric: She has a normal mood and affect. Her behavior is normal. Judgment and thought content normal.       Assessment & Plan:

## 2012-01-14 NOTE — Assessment & Plan Note (Signed)
,  I have personally reviewed the Medicare Annual Wellness questionnaire and have noted 1. The patient's medical and social history 2. Their use of alcohol, tobacco or illicit drugs 3. Their current medications and supplements 4. The patient's functional ability including ADL's, fall risks, home safety risks and hearing or visual impairment. 5. Diet and physical activity 6. Evidence for depression or mood disorders The patients weight, height, BMI have been recorded in the chart.  Hearing and vision has been addressed. I have made referrals, counseling and provided education to the patient based review of the above and I have provided the pt with a written personalized care plan for preventive services. See scanned questionairre. Advanced directives discussed: will bring me copy of living will.  HCPOA would be daughter.  Reviewed preventative protocols and updated unless pt declined.

## 2012-01-14 NOTE — Addendum Note (Signed)
Addended by: Josph Macho A on: 01/14/2012 04:17 PM   Modules accepted: Orders

## 2012-01-16 ENCOUNTER — Encounter: Payer: Self-pay | Admitting: Family Medicine

## 2012-01-21 ENCOUNTER — Ambulatory Visit (INDEPENDENT_AMBULATORY_CARE_PROVIDER_SITE_OTHER): Payer: Medicare Other | Admitting: Family Medicine

## 2012-01-21 ENCOUNTER — Encounter: Payer: Self-pay | Admitting: Family Medicine

## 2012-01-21 VITALS — BP 138/84 | HR 77 | Temp 98.0°F | Wt 169.2 lb

## 2012-01-21 DIAGNOSIS — J219 Acute bronchiolitis, unspecified: Secondary | ICD-10-CM

## 2012-01-21 DIAGNOSIS — J218 Acute bronchiolitis due to other specified organisms: Secondary | ICD-10-CM

## 2012-01-21 MED ORDER — AZITHROMYCIN 250 MG PO TABS: ORAL_TABLET | ORAL | Status: DC

## 2012-01-21 NOTE — Progress Notes (Signed)
Nature conservation officer at [facility] [address] [place] [address] Phone: [phone] Fax: [phone]  Date:  01/21/2012   Name:  Jordan Patterson   DOB:  [birth year]   MRN:  [id number] Gender: female Age: 71 y.o.  PCP:  Eustaquio Boyden, MD  Evaluating MD: Hannah Beat, MD   Chief Complaint: Cough   History of Present Illness:  Jordan Patterson is a 71 y.o. pleasant patient who presents with the following:  Coughing a lot, and started on sat evening and coughed all night on sat night. Tues got some mucinex might help a little, but does not really help a whole lot. Afebrile. No chest pain or shortness of breath. The patient has had a productive cough.  No nausea, vomiting, or diarrhea. No significant rash. No significant headache. No significant sore throat.   Patient Active Problem List  Diagnosis  . Hiatal hernia  . GERD (gastroesophageal reflux disease)  . Hypertension  . Hyperlipidemia  . Depression  . Hypothyroidism  . Osteopenia  . Diabetes type 2, controlled  . Arthritis  . Medicare annual wellness visit, subsequent    Past Medical History  Diagnosis Date  . Hiatal hernia   . GERD (gastroesophageal reflux disease)     [name] EGD - mild esophageal dysmotility and small hiatal hernia  . Hypertension   . History of asthma   . Hyperlipidemia     mild, diet controlled  . Depression   . Hypothyroidism   . Osteopenia   . Diabetes type 2, controlled 2004    diet controlled  . Macular degeneration   . Arthritis     hands and knees  . Environmental allergies     dust,mold,mildew  . Hx of migraines   . Chronic headaches     Past Surgical History  Procedure Date  . Dilation and curettage of uterus 1978  . Tonsillectomy and adenoidectomy 1962  . Laparoscopy abdomen diagnostic     To R/O endometriosis  . Esophagogastroduodenoscopy 06/25/2011    Procedure: ESOPHAGOGASTRODUODENOSCOPY (EGD);  Surgeon: Hart Carwin, MD;  Location: [facility];   Service: Endoscopy;  Laterality: N/A;  no Xray  . Savory dilation 06/25/2011    Procedure: SAVORY DILATION;  Surgeon: Hart Carwin, MD;  Location: [facility];  Service: Endoscopy;  Laterality: N/A;  . Cataract extraction 2003    bilateral  . Dexa 2010    improved (initial DEXA mild osteopenia)  . Colonoscopy     History  Substance Use Topics  . Smoking status: Never Smoker   . Smokeless tobacco: Never Used  . Alcohol Use: No    Family History  Problem Relation Age of Onset  . Mitral valve prolapse Father   . Mitral valve prolapse Mother   . Hypertension Mother   . Diabetes Father   . Stroke Mother 30    after valve surgery  . Stroke Paternal Grandfather   . Coronary artery disease Maternal Grandfather   . Cancer Neg Hx     Allergies  Allergen Reactions  . Ace Inhibitors Cough  . Amlodipine Cough  . Pregabalin   . Tegretol (Carbamazepine)     Dizziness, headache  . Wellbutrin (Bupropion Hcl)   . Cymbalta (Duloxetine Hcl) Palpitations    And headaches    Medication list has been reviewed and updated.  Outpatient Prescriptions Prior to Visit  Medication Sig Dispense Refill  . AMBULATORY NON FORMULARY MEDICATION [facility] As directed      .  Chlorpheniramine Maleate (ALLERGY PO) Take by mouth as directed.      . Cholecalciferol (VITAMIN D-3 PO) Take 4,000 mg by mouth daily.      . Cinnamon 500 MG TABS Take 1 tablet by mouth 2 (two) times daily.       . Coenzyme Q10 (COQ10) 100 MG CAPS Take 1 capsule by mouth daily.      . cycloSPORINE (RESTASIS) 0.05 % ophthalmic emulsion Place 1 drop into both eyes 2 (two) times daily.       Marland Kitchen levothyroxine (SYNTHROID, LEVOTHROID) 88 MCG tablet Take 1 tablet (88 mcg total) by mouth daily.  30 tablet  3  . loteprednol (LOTEMAX) 0.5 % ophthalmic suspension Apply 1 drop to eye as needed.       Marland Kitchen olmesartan-hydrochlorothiazide (BENICAR HCT) 40-25 MG per tablet Take 1 tablet by mouth daily.  30 tablet  11  . omeprazole (PRILOSEC OTC)  20 MG tablet Take 20 mg by mouth daily.      . Probiotic Product (ALIGN) 4 MG CAPS Take 1 capsule by mouth daily.      Gerarda Fraction Root 500 MG CAPS Take 1 capsule by mouth 2 (two) times daily.       Last reviewed on 01/21/2012 11:41 AM by Shon Millet, CMA  Review of Systems:  ROS: GEN: Acute illness details above GI: Tolerating PO intake GU: maintaining adequate hydration and urination Pulm: No SOB Interactive and getting along well at home.  Otherwise, ROS is as per the HPI.   Physical Examination: Filed Vitals:   01/21/12 1138  BP: 138/84  Pulse: 77  Temp: 98 F (36.7 C)  TempSrc: Oral  Weight: 169 lb 4 oz (76.771 kg)    There is no height on file to calculate BMI. Ideal Body Weight:     GEN: A and O x 3. WDWN. NAD.    ENT: Nose clear, ext NML.  No LAD.  No JVD.  TM's clear. Oropharynx clear.  PULM: Normal WOB, no distress. No crackles, wheezes, rhonchi. CV: RRR, no M/G/R, No rubs, No JVD.   EXT: warm and well-perfused, No c/c/e. PSYCH: Pleasant and conversant.   Assessment and Plan:  1. Bronchiolitis, acute     Acute bronchitis: discussed plan of care. Given length of symptoms and overall history, will treat with ABX in this case. Continue with additional supportive care, cough medications, liquids, sleep, steam / vaporizer.  Orders Today:  No orders of the defined types were placed in this encounter.    Updated Medication List: (Includes new medications, updates to list, dose adjustments) Meds ordered this encounter  Medications  . azithromycin (ZITHROMAX) 250 MG tablet    Sig: 2 tabs po on day 1, then 1 tab po for 4 days    Dispense:  6 tablet    Refill:  0    Medications Discontinued: There are no discontinued medications.   Hannah Beat, MD

## 2012-03-09 ENCOUNTER — Encounter: Payer: Self-pay | Admitting: Family Medicine

## 2012-03-09 ENCOUNTER — Ambulatory Visit (INDEPENDENT_AMBULATORY_CARE_PROVIDER_SITE_OTHER): Payer: Medicare Other | Admitting: Family Medicine

## 2012-03-09 VITALS — BP 132/80 | HR 76 | Temp 98.3°F | Wt 169.5 lb

## 2012-03-09 DIAGNOSIS — R05 Cough: Secondary | ICD-10-CM

## 2012-03-09 DIAGNOSIS — R059 Cough, unspecified: Secondary | ICD-10-CM

## 2012-03-09 DIAGNOSIS — J4540 Moderate persistent asthma, uncomplicated: Secondary | ICD-10-CM | POA: Insufficient documentation

## 2012-03-09 MED ORDER — FLUTICASONE PROPIONATE 50 MCG/ACT NA SUSP: 2.0000 | Freq: Every day | NASAL | Status: DC

## 2012-03-09 NOTE — Progress Notes (Signed)
  Subjective:    Patient ID: Jordan Patterson, female    DOB: 1940-02-29, 72 y.o.   MRN: [id number]  HPI CC: cough/allergies?  Several month history of cough.  1 wk ago suffered "allergy attack".  Lots of sneezing, blowing nose, rhinorrhea.  Clear mucous.  Mild nasal congestion.  Occasional productive cough mostly clear, some yellow sputum.  Cough possibly aggravated by food.  Not much PNDrainage.  No fevers/chills, abd pain, nausea/vomiting, indigestion, no endorsed heartburn.  No headache, ear or tooth pain, sinus pressure.  Has started taking zyrtec which helps.  Also taking mucinex DM twice daily.  Seen by Dr. Patsy Lager 6 wks ago with dx bronchitis and treated with zpack, unsure if helped.  On benicar HCT daily as well as omeprazole 20mg  daily for GERD, occasionally bid.  Told has silent reflux.  No sick contacts at home. No smokers at home. No h/o asthma.  Inhalers haven't helped in past for cough. H/o environmental allergies - dust, mold, pollen.  Significant second hand smoke exposure.  CXR done 2009 - hyperinflation.  Past Medical History  Diagnosis Date  . Hiatal hernia   . GERD (gastroesophageal reflux disease)     [name] EGD - mild esophageal dysmotility and small hiatal hernia  . Hypertension   . History of asthma   . Hyperlipidemia     mild, diet controlled  . Depression   . Hypothyroidism   . Osteopenia   . Diabetes type 2, controlled 2004    diet controlled  . Macular degeneration   . Arthritis     hands and knees  . Environmental allergies     dust,mold,mildew  . Hx of migraines   . Chronic headaches      Review of Systems Per HPI    Objective:   Physical Exam  Nursing note and vitals reviewed. Constitutional: She appears well-developed and well-nourished. No distress.  HENT:  Head: Normocephalic and atraumatic.  Right Ear: Hearing, external ear and ear canal normal.  Left Ear: Hearing, external ear and ear canal normal.  Nose: Mucosal edema and  rhinorrhea present.  Mouth/Throat: Uvula is midline, oropharynx is clear and moist and mucous membranes are normal. No oropharyngeal exudate, posterior oropharyngeal edema, posterior oropharyngeal erythema or tonsillar abscesses.       Evidently congested Cerumen in bilateral ear canals  Eyes: Conjunctivae normal and EOM are normal. Pupils are equal, round, and reactive to light. No scleral icterus.       Injected conjunctiva bilaterally  Neck: Normal range of motion. Neck supple.  Cardiovascular: Normal rate, regular rhythm, normal heart sounds and intact distal pulses.   No murmur heard. Pulmonary/Chest: Effort normal and breath sounds normal. No respiratory distress. She has no wheezes. She has no rales.  Lymphadenopathy:    She has no cervical adenopathy.  Skin: Skin is warm and dry. No rash noted.       Assessment & Plan:

## 2012-03-09 NOTE — Patient Instructions (Signed)
I think this is more an allergic cough - continue zyrtec, mucinex, and start nasal saline. I've prescribed nasal steroid and sent to your pharmacy.  watch for nose bleeds on this medicine. Increase omeprazole to 20mg  twice daily for a week to see if any improvement. Let me know if not improving for xray.

## 2012-03-09 NOTE — Assessment & Plan Note (Signed)
Anticipate allergy related.  Discussed this. Treat with INS and continued mucinex/zyrtec and start nasal saline. rec increase prilosec to 20mg  bid for next week to see if improvement with this (?GERD related). Alternatively consider chronic bronchitis (COPD) vs other. If not improved with this, return for CXR.  Pt agrees with plan. A total of 25 minutes were spent face-to-face with the patient during this encounter and over half of that time was spent on counseling and coordination of care

## 2012-03-10 ENCOUNTER — Ambulatory Visit: Payer: Medicare Other | Admitting: Family Medicine

## 2012-03-25 LAB — HM DIABETES EYE EXAM

## 2012-03-28 ENCOUNTER — Encounter: Payer: Self-pay | Admitting: Family Medicine

## 2012-03-28 ENCOUNTER — Ambulatory Visit (INDEPENDENT_AMBULATORY_CARE_PROVIDER_SITE_OTHER)
Admission: RE | Admit: 2012-03-28 | Discharge: 2012-03-28 | Disposition: A | Discharge status: Home / Self Care | Payer: Medicare Other | Source: Ambulatory Visit | Attending: Family Medicine | Admitting: Family Medicine

## 2012-03-28 ENCOUNTER — Ambulatory Visit (INDEPENDENT_AMBULATORY_CARE_PROVIDER_SITE_OTHER): Payer: Medicare Other | Admitting: Family Medicine

## 2012-03-28 VITALS — BP 120/62 | HR 88 | Temp 98.2°F | Wt 170.5 lb

## 2012-03-28 DIAGNOSIS — R059 Cough, unspecified: Secondary | ICD-10-CM

## 2012-03-28 DIAGNOSIS — R05 Cough: Secondary | ICD-10-CM

## 2012-03-28 MED ORDER — BENZONATATE 100 MG PO CAPS: 100.0000 mg | ORAL_CAPSULE | Freq: Two times a day (BID) | ORAL | Status: DC | PRN

## 2012-03-28 NOTE — Patient Instructions (Addendum)
Xray looking ok today. Let's keep an eye on this cough as well as the fever. May use tylenol as needed, push fluids and rest. Continue over the counter cough medicines.  Try tessalon perls for cough - swallow, don't chew Return in 2-3 weeks once cough is better for spirometry (pre and post albuterol). Watch for persistent or worsening fever, cough that's getting more productive or any other concerns.

## 2012-03-28 NOTE — Assessment & Plan Note (Addendum)
Now with new worsening of cough associated with low grade fever last night. Lungs clear, but regardless will check CXR - clear on my read. Persistent cough - ? Asthma vs chronic bronchitis related.  Never smoker.   Check spirometry vs referral to pulm for further eval - pt requests spirometry eval.  Will return in 2-3 wks for this. Anticipate viral URI superimposed on chronic cough - treat with tessalon perls

## 2012-03-28 NOTE — Progress Notes (Signed)
  Subjective:    Patient ID: Jordan Patterson, female    DOB: 12-Aug-1940, 72 y.o.   MRN: [id number]  HPI CC: chills  See prior note for details regarding persistent cough.  Briefly, thought related to GERD vs allergic rhinitis so omeprazole was increased to 20mg  bid as well as continued zyrtec, and flonase.  Did not really help cough.  Yesterday afternoon started with chills, last night Tmax 101.  Persistent cough present.  Over last day cough has worsened.  Minimal production of sputum.  + sneezing and rhinorrhea.  No abd pain, n/v, head congestion, PNdrainage, ear or tooth pain, really no significant chest congestion either.  No body aches or muscle aches.  Self treated with tylenol at 6am.  Still feeling ill.  Did receive flu shot this year.  Works at [employer] so around sick ppl. No smokers at home.  Past Medical History  Diagnosis Date  . Hiatal hernia   . GERD (gastroesophageal reflux disease)     [name] EGD - mild esophageal dysmotility and small hiatal hernia  . Hypertension   . History of asthma   . Hyperlipidemia     mild, diet controlled  . Depression   . Hypothyroidism   . Osteopenia   . Diabetes type 2, controlled 2004    diet controlled  . Macular degeneration   . Arthritis     hands and knees  . Environmental allergies     dust,mold,mildew  . Hx of migraines   . Chronic headaches      Review of Systems Per HPI    Objective:   Physical Exam  Nursing note and vitals reviewed. Constitutional: She appears well-developed and well-nourished. No distress.  HENT:  Head: Normocephalic and atraumatic.  Right Ear: Hearing, tympanic membrane, external ear and ear canal normal.  Left Ear: Hearing, tympanic membrane, external ear and ear canal normal.  Nose: No mucosal edema or rhinorrhea.  Mouth/Throat: Uvula is midline, oropharynx is clear and moist and mucous membranes are normal. No oropharyngeal exudate, posterior oropharyngeal edema, posterior oropharyngeal  erythema or tonsillar abscesses.       Cerumen in canals No significant PNDrainage appreciated  Eyes: Conjunctivae normal and EOM are normal. Pupils are equal, round, and reactive to light. No scleral icterus.  Neck: Normal range of motion. Neck supple.  Cardiovascular: Normal rate, regular rhythm, normal heart sounds and intact distal pulses.   No murmur heard. Pulmonary/Chest: Effort normal and breath sounds normal. No respiratory distress. She has no wheezes. She has no rales.       Lungs clear, slight rhonchi RML Rare cough present on exam  Lymphadenopathy:    She has no cervical adenopathy.  Skin: Skin is warm and dry. No rash noted.       Assessment & Plan:

## 2012-03-29 ENCOUNTER — Encounter: Payer: Self-pay | Admitting: Family Medicine

## 2012-03-29 DIAGNOSIS — R059 Cough, unspecified: Secondary | ICD-10-CM

## 2012-03-29 DIAGNOSIS — R05 Cough: Secondary | ICD-10-CM

## 2012-03-31 ENCOUNTER — Encounter: Payer: Self-pay | Admitting: Family Medicine

## 2012-04-02 ENCOUNTER — Encounter: Payer: Self-pay | Admitting: Family Medicine

## 2012-04-18 ENCOUNTER — Other Ambulatory Visit: Payer: Self-pay | Admitting: *Deleted

## 2012-04-18 ENCOUNTER — Ambulatory Visit: Payer: Medicare Other | Admitting: Family Medicine

## 2012-04-18 MED ORDER — LEVOTHYROXINE SODIUM 88 MCG PO TABS: 88.0000 ug | ORAL_TABLET | Freq: Every day | ORAL | Status: DC

## 2012-04-24 ENCOUNTER — Ambulatory Visit (INDEPENDENT_AMBULATORY_CARE_PROVIDER_SITE_OTHER): Payer: Medicare Other | Admitting: Pulmonary Disease

## 2012-04-24 ENCOUNTER — Encounter: Payer: Self-pay | Admitting: Pulmonary Disease

## 2012-04-24 VITALS — BP 124/82 | HR 80 | Temp 98.2°F | Ht 68.0 in | Wt 172.8 lb

## 2012-04-24 DIAGNOSIS — R053 Chronic cough: Secondary | ICD-10-CM

## 2012-04-24 DIAGNOSIS — R059 Cough, unspecified: Secondary | ICD-10-CM

## 2012-04-24 MED ORDER — ALBUTEROL SULFATE HFA 108 (90 BASE) MCG/ACT IN AERS: 2.0000 | INHALATION_SPRAY | Freq: Four times a day (QID) | RESPIRATORY_TRACT | Status: DC | PRN

## 2012-04-24 MED ORDER — MOMETASONE FURO-FORMOTEROL FUM 100-5 MCG/ACT IN AERO: 2.0000 | INHALATION_SPRAY | Freq: Two times a day (BID) | RESPIRATORY_TRACT | Status: DC

## 2012-04-24 MED ORDER — MONTELUKAST SODIUM 10 MG PO TABS: 10.0000 mg | ORAL_TABLET | Freq: Every day | ORAL | Status: DC

## 2012-04-24 MED ORDER — AEROCHAMBER MV MISC: Status: DC

## 2012-04-24 MED ORDER — FLUTICASONE PROPIONATE 50 MCG/ACT NA SUSP: 2.0000 | Freq: Every day | NASAL | Status: DC

## 2012-04-24 NOTE — Progress Notes (Deleted)
  Subjective:    Patient ID: Jordan Patterson, female    DOB: August 31, 1940, 72 y.o.   MRN: [id number]  HPI    Review of Systems  Constitutional: Negative for appetite change and unexpected weight change.  HENT: Positive for sneezing and trouble swallowing. Negative for ear pain, congestion, sore throat and dental problem.   Respiratory: Positive for cough and shortness of breath.   Cardiovascular: Negative for chest pain, palpitations and leg swelling.  Gastrointestinal: Negative for abdominal pain.  Musculoskeletal: Positive for arthralgias. Negative for joint swelling.  Neurological: Negative for headaches.  Psychiatric/Behavioral: Negative for dysphoric mood. The patient is not nervous/anxious.        Objective:   Physical Exam        Assessment & Plan:

## 2012-04-24 NOTE — Progress Notes (Signed)
Chief Complaint  Patient presents with  . Advice Only    c/o dry cough mostly x 4 years. At times food can make it worse. The cough can occur at anytime.    History of Present Illness: Jordan Patterson is a 72 y.o. female for evaluation of chronic cough.  She has a history of asthma.  She was seen by Dr. Laurette Schimke about 3 years ago.  She was told she is allergic to cats and dust mites.  She has used albuterol nebulizer, and this provides temporary relief of her cough.  She tried flovent and advair before.  She has never been on singulair.  She has not had recent PFT's.  Her daughters has allergy problems also.  Her cough has been present for years.  This happens mostly during the day.  She has trouble speaking without developing an urge to cough.  She has noticed that drinking tea helps her cough.  She gets a runny nose with clear drainage.  She tends to have more problem with her allergies in the Fall.  She has a history of reflux.  She has noticed her cough is worse after eating sometimes.  She denies globus sensation or burning/bitter taste in her mouth.  She takes a PPI and tried increasing the dose for one month w/o improvement in her cough.  She was seen previously by Dr. [name] with GI.  She had an esophageal stricture which was dilated.  She thinks her cough was worse after this.   She works as a Diplomatic Services operational officer at [employer].  She has noticed that her cough is worse when she works in certain older buildings.  She thinks she has an allergy to mold.  She is from [place].  She denies any recent travel.  There is no history of exposure to tuberculosis or history of pneumonia.  She has a Emergency planning/management officer.  She never smoked, but both her father and father-in-law were heavy smokers.  She was on prednisone years ago for recurrent episodes of bronchitis.  Tests:   MIKKI ZIFF  has a past medical history of Hiatal hernia; GERD (gastroesophageal reflux disease); Hypertension; History of asthma;  Hyperlipidemia; Depression; Hypothyroidism; Osteopenia; Diabetes type 2, controlled (2004); Macular degeneration; Arthritis; Environmental allergies; migraines; and Chronic headaches.  CYNDEL GRIFFEY  has past surgical history that includes Dilation and curettage of uterus (1978); Tonsillectomy and adenoidectomy (1962); Laparoscopy abdomen diagnostic; Esophagogastroduodenoscopy (06/25/2011); Savory dilation (06/25/2011); Cataract extraction (2003); dexa (2010); and Colonoscopy.  Prior to Admission medications   Medication Sig Start Date End Date Taking? Authorizing Provider  AMBULATORY NON FORMULARY MEDICATION [facility] As directed   Yes Historical Provider, MD  cetirizine (ZYRTEC) 10 MG tablet Take 10 mg by mouth daily.   Yes Historical Provider, MD  Cholecalciferol (VITAMIN D-3 PO) Take 4,000 mg by mouth daily.   Yes Historical Provider, MD  Cinnamon 500 MG TABS Take 1 tablet by mouth 2 (two) times daily.    Yes Historical Provider, MD  Coenzyme Q10 (COQ10) 100 MG CAPS Take 1 capsule by mouth daily.   Yes Historical Provider, MD  cycloSPORINE (RESTASIS) 0.05 % ophthalmic emulsion Place 1 drop into both eyes 2 (two) times daily.    Yes Historical Provider, MD  levothyroxine (SYNTHROID, LEVOTHROID) 88 MCG tablet Take 1 tablet (88 mcg total) by mouth daily. 04/18/12  Yes Eustaquio Boyden, MD  loteprednol (LOTEMAX) 0.5 % ophthalmic suspension Apply 1 drop to eye as needed.    Yes Historical  Provider, MD  olmesartan-hydrochlorothiazide (BENICAR HCT) 40-25 MG per tablet Take 1 tablet by mouth daily. 10/21/11  Yes Eustaquio Boyden, MD  omeprazole (PRILOSEC OTC) 20 MG tablet Take 20 mg by mouth daily.   Yes Historical Provider, MD  Probiotic Product (ALIGN) 4 MG CAPS Take 1 capsule by mouth daily.   Yes Historical Provider, MD  Valerian Root 500 MG CAPS Take 1 capsule by mouth 2 (two) times daily.   Yes Historical Provider, MD    Allergies  Allergen Reactions  . Ace Inhibitors Cough  . Amlodipine Cough   . Pregabalin   . Tegretol (Carbamazepine)     Dizziness, headache  . Wellbutrin (Bupropion Hcl)   . Cymbalta (Duloxetine Hcl) Palpitations    And headaches    family history includes Coronary artery disease in her maternal grandfather; Diabetes in her father; Hypertension in her mother; Mitral valve prolapse in her father and mother; Stroke in her paternal grandfather; and Stroke (age of onset: 73) in her mother.  There is no history of Cancer.   reports that she has never smoked. She has never used smokeless tobacco. She reports that she does not drink alcohol or use illicit drugs.  Review of Systems  Constitutional: Negative for appetite change and unexpected weight change.  HENT: Positive for sneezing and trouble swallowing. Negative for ear pain, congestion, sore throat and dental problem.   Respiratory: Positive for cough and shortness of breath.   Cardiovascular: Negative for chest pain, palpitations and leg swelling.  Gastrointestinal: Negative for abdominal pain.  Musculoskeletal: Positive for arthralgias. Negative for joint swelling.  Neurological: Negative for headaches.  Psychiatric/Behavioral: Negative for dysphoric mood. The patient is not nervous/anxious.    Physical Exam:  General - No distress ENT - No sinus tenderness, no oral exudate, no LAN, no thyromegaly, TM clear, pupils equal/reactive Cardiac - s1s2 regular, no murmur, pulses symmetric Chest - No wheeze/rales/dullness, good air entry, normal respiratory excursion Back - No focal tenderness Abd - Soft, non-tender, no organomegaly, + bowel sounds Ext - No edema Neuro - Normal strength, cranial nerves intact Skin - No rashes Psych - Normal mood, and behavior   03/28/2012  *RADIOLOGY REPORT*   Clinical Data: History of fever.  History of chronic coughing.   CHEST - 2 VIEW  Comparison: 04/23/2009.   Findings: Cardiac silhouette is normal size and shape.  Mediastinal and hilar contours appear stable.  No  pulmonary infiltrates or masses are evident.  There is slight flattening of the diaphragm on lateral image consistent with minimal hyperinflation.  There is minimal degenerative spondylosis.  There is slightly osteopenic appearance of bones.   IMPRESSION: Minimal hyperinflation configuration with slight flattening of the diaphragm on lateral image.  No pulmonary edema, pneumonia, or pleural effusion is evident.    Original Report Authenticated By: Onalee Hua Call     Lab Results  Component Value Date   WBC 5.0 12/16/2010   HGB 14.9 12/16/2010    Lab Results  Component Value Date   CREATININE 0.9 01/06/2012   BUN 25* 01/06/2012   NA 140 01/06/2012   K 4.8 01/06/2012   CL 104 01/06/2012   CO2 27 01/06/2012    Lab Results  Component Value Date   ALT 21 01/06/2012   AST 27 01/06/2012   ALKPHOS 56 01/06/2012   BILITOT 0.5 01/06/2012    Lab Results  Component Value Date   TSH 1.66 01/06/2012     Assessment/Plan:  Coralyn Helling, MD [facility]/Sleep Pager:  [phone] 04/24/2012, 9:33 AM

## 2012-04-24 NOTE — Patient Instructions (Addendum)
Continue using saline spray (nasal irrigation) for your sinuses daily Flonase two sprays each nostril daily >> use after using saline nasal spray Continue zyrtec daily Singulair 10 mg pill nightly Dulera two puffs twice per day, and rinse mouth after each use Proair two puffs as needed for cough, wheeze, or chest congestion Use dulera and proair with spacer device Will schedule breathing test (PFT) Will get records from Dr. Laurette Schimke Follow up in 4 weeks

## 2012-04-25 ENCOUNTER — Encounter: Payer: Self-pay | Admitting: Pulmonary Disease

## 2012-04-25 NOTE — Assessment & Plan Note (Signed)
She has chronic cough.  She has history of second hand tobacco exposure.  She has hyperinflation on recent chest xray, and has a history of allergies with post-nasal drip, and asthma.  I suspect her cough is related to these two issues.  I explained to her how these are interrelated with regard to her cough.  For her sinuses and post-nasal drip I have advised the following:   Continue using saline spray (nasal irrigation) for your sinuses daily   Flonase two sprays each nostril daily >> use after using saline nasal spray   Continue zyrtec daily  For her asthma I have advised the following:   Singulair 10 mg pill nightly (this should help with her allergies also)   Dulera two puffs twice per day, and rinse mouth after each use   Proair two puffs as needed for cough, wheeze, or chest congestion   Use dulera and proair with spacer device  I have explained to her that she needs to be persistent with these therapies for at least several weeks, if not longer, to determine benefit.  Will schedule PFT's, and get records from Dr. Laurette Schimke.  Have also discussed importance of environmental control.  My suspicion that her reflux is contributing to her cough is lower.  However, if she is not improved with the above regimen, then she may need f/u with Dr. [name].

## 2012-05-15 ENCOUNTER — Encounter: Payer: Self-pay | Admitting: Pulmonary Disease

## 2012-05-15 DIAGNOSIS — J309 Allergic rhinitis, unspecified: Secondary | ICD-10-CM | POA: Insufficient documentation

## 2012-05-15 DIAGNOSIS — H1010 Acute atopic conjunctivitis, unspecified eye: Secondary | ICD-10-CM | POA: Insufficient documentation

## 2012-06-01 ENCOUNTER — Ambulatory Visit (INDEPENDENT_AMBULATORY_CARE_PROVIDER_SITE_OTHER): Payer: Medicare Other | Admitting: Pulmonary Disease

## 2012-06-01 ENCOUNTER — Encounter: Payer: Self-pay | Admitting: Pulmonary Disease

## 2012-06-01 VITALS — BP 142/86 | HR 84 | Temp 98.8°F | Ht 68.0 in | Wt 176.0 lb

## 2012-06-01 DIAGNOSIS — R059 Cough, unspecified: Secondary | ICD-10-CM

## 2012-06-01 DIAGNOSIS — R05 Cough: Secondary | ICD-10-CM

## 2012-06-01 DIAGNOSIS — J309 Allergic rhinitis, unspecified: Secondary | ICD-10-CM

## 2012-06-01 DIAGNOSIS — J449 Chronic obstructive pulmonary disease, unspecified: Secondary | ICD-10-CM

## 2012-06-01 DIAGNOSIS — H1013 Acute atopic conjunctivitis, bilateral: Secondary | ICD-10-CM

## 2012-06-01 DIAGNOSIS — R053 Chronic cough: Secondary | ICD-10-CM

## 2012-06-01 DIAGNOSIS — H1010 Acute atopic conjunctivitis, unspecified eye: Secondary | ICD-10-CM

## 2012-06-01 MED ORDER — FLUTICASONE PROPIONATE 50 MCG/ACT NA SUSP: 2.0000 | Freq: Every day | NASAL | Status: DC

## 2012-06-01 MED ORDER — MOMETASONE FURO-FORMOTEROL FUM 100-5 MCG/ACT IN AERO: 2.0000 | INHALATION_SPRAY | Freq: Two times a day (BID) | RESPIRATORY_TRACT | Status: DC

## 2012-06-01 NOTE — Progress Notes (Signed)
PFT done today. 

## 2012-06-01 NOTE — Patient Instructions (Signed)
Continue dulera two puffs twice per day Continue singulair 10 mg nightly Continue flonase two sprays each nostril daily Continue zyrtec 10 mg daily as needed for allergies Proair as needed for cough, wheeze, or chest congestion Follow up in 4 months

## 2012-06-01 NOTE — Progress Notes (Signed)
Chief Complaint  Patient presents with  . Follow-up    w/ PFT. Pt states her cough has slightly improved. It is sti;ll a dry cough. Pt had not used dulera x 2 days when she ran out of sample    History of Present Illness: Jordan Patterson is a 72 y.o. female with chronic cough related to allergic rhinitis, post-nasal drip, and asthma.  She is here with her daughter to review her PFT's.  She feels that using flonase and dulera have helped.  She ran out off these, and was holding off on refills until she had follow up visit.  She has noticed her cough and breathing have been worse w/o these medicines.  She was not using albuterol much, but has been using several times per day since stopping dulera.  She had trouble with cough after using dulera when she first started with this >> this has since gotten better, and she is tolerating dulera better.  Tests: Allergy test 12/08/07 >> Perennial rye 2+, Black walnut pollen 3+, Tree mix 4+, Dust mite 4+, Cat/Dog 4+ PFT 06/01/12 >> FEV1 1.88 (80%), FEV1% 66, TLC 5.63 (100%), RV 134%, DLCO 78%, +BD   BREA COLESON  has a past medical history of Hiatal hernia; GERD (gastroesophageal reflux disease); Hypertension; History of asthma; Hyperlipidemia; Depression; Hypothyroidism; Osteopenia; Diabetes type 2, controlled (2004); Macular degeneration; Arthritis; Environmental allergies; migraines; and Chronic headaches.  JONITA HIROTA  has past surgical history that includes Dilation and curettage of uterus (1978); Tonsillectomy and adenoidectomy (1962); Laparoscopy abdomen diagnostic; Esophagogastroduodenoscopy (06/25/2011); Savory dilation (06/25/2011); Cataract extraction (2003); dexa (2010); and Colonoscopy.  Prior to Admission medications   Medication Sig Start Date End Date Taking? Authorizing Provider  AMBULATORY NON FORMULARY MEDICATION [facility] As directed   Yes Historical Provider, MD  cetirizine (ZYRTEC) 10 MG tablet Take 10 mg by mouth daily.   Yes  Historical Provider, MD  Cholecalciferol (VITAMIN D-3 PO) Take 4,000 mg by mouth daily.   Yes Historical Provider, MD  Cinnamon 500 MG TABS Take 1 tablet by mouth 2 (two) times daily.    Yes Historical Provider, MD  Coenzyme Q10 (COQ10) 100 MG CAPS Take 1 capsule by mouth daily.   Yes Historical Provider, MD  cycloSPORINE (RESTASIS) 0.05 % ophthalmic emulsion Place 1 drop into both eyes 2 (two) times daily.    Yes Historical Provider, MD  levothyroxine (SYNTHROID, LEVOTHROID) 88 MCG tablet Take 1 tablet (88 mcg total) by mouth daily. 04/18/12  Yes Eustaquio Boyden, MD  loteprednol (LOTEMAX) 0.5 % ophthalmic suspension Apply 1 drop to eye as needed.    Yes Historical Provider, MD  olmesartan-hydrochlorothiazide (BENICAR HCT) 40-25 MG per tablet Take 1 tablet by mouth daily. 10/21/11  Yes Eustaquio Boyden, MD  omeprazole (PRILOSEC OTC) 20 MG tablet Take 20 mg by mouth daily.   Yes Historical Provider, MD  Probiotic Product (ALIGN) 4 MG CAPS Take 1 capsule by mouth daily.   Yes Historical Provider, MD  Valerian Root 500 MG CAPS Take 1 capsule by mouth 2 (two) times daily.   Yes Historical Provider, MD    Allergies  Allergen Reactions  . Ace Inhibitors Cough  . Amlodipine Cough  . Pregabalin   . Tegretol (Carbamazepine)     Dizziness, headache  . Wellbutrin (Bupropion Hcl)   . Cymbalta (Duloxetine Hcl) Palpitations    And headaches     Physical Exam:  General - No distress ENT - No sinus tenderness, no oral exudate, no LAN Cardiac -  s1s2 regular, no murmur Chest - No wheeze/rales/dullness Back - No focal tenderness Abd - Soft, non-tender Ext - No edema Neuro - Normal strength Skin - No rashes Psych - Normal mood, and behavior  Assessment/Plan:  Coralyn Helling, MD [facility]/Sleep Pager:  [phone] 06/01/2012, 10:20 AM

## 2012-06-01 NOTE — Assessment & Plan Note (Signed)
Will refill flonase.  She is to continue zyrtec, and singulair.  If her symptoms worsen, then may need to consider additional allergy testing.

## 2012-06-01 NOTE — Assessment & Plan Note (Signed)
She has mild obstruction, air trapping, and positive bronchodilator response on PFT.  She reports symptomatic benefit from inhaler therapy.  Will refill her dulera, and use with spacer device.  She is to continue singulair and as needed proair.

## 2012-06-05 ENCOUNTER — Encounter: Payer: Self-pay | Admitting: Pulmonary Disease

## 2012-06-05 ENCOUNTER — Other Ambulatory Visit: Payer: Self-pay

## 2012-06-07 ENCOUNTER — Encounter: Payer: Self-pay | Admitting: Family Medicine

## 2012-06-08 NOTE — Telephone Encounter (Signed)
Can we call pt to schedule appt?

## 2012-06-14 ENCOUNTER — Encounter: Payer: Self-pay | Admitting: Family Medicine

## 2012-06-14 ENCOUNTER — Ambulatory Visit (INDEPENDENT_AMBULATORY_CARE_PROVIDER_SITE_OTHER): Payer: Medicare Other | Admitting: Family Medicine

## 2012-06-14 VITALS — BP 128/86 | HR 64 | Temp 97.9°F | Wt 174.8 lb

## 2012-06-14 DIAGNOSIS — R5383 Other fatigue: Secondary | ICD-10-CM | POA: Insufficient documentation

## 2012-06-14 DIAGNOSIS — R5381 Other malaise: Secondary | ICD-10-CM

## 2012-06-14 LAB — CBC WITH DIFFERENTIAL/PLATELET
Basophils Absolute: 0 10*3/uL (ref 0.0–0.1)
Basophils Relative: 0.6 % (ref 0.0–3.0)
Eosinophils Absolute: 0.1 10*3/uL (ref 0.0–0.7)
Eosinophils Relative: 1.4 % (ref 0.0–5.0)
HCT: 39.9 % (ref 36.0–46.0)
Hemoglobin: 13.6 g/dL (ref 12.0–15.0)
Lymphocytes Relative: 35.2 % (ref 12.0–46.0)
Lymphs Abs: 2.1 10*3/uL (ref 0.7–4.0)
MCHC: 34 g/dL (ref 30.0–36.0)
MCV: 90.9 fl (ref 78.0–100.0)
Monocytes Absolute: 0.5 10*3/uL (ref 0.1–1.0)
Monocytes Relative: 7.9 % (ref 3.0–12.0)
Neutro Abs: 3.3 10*3/uL (ref 1.4–7.7)
Neutrophils Relative %: 54.9 % (ref 43.0–77.0)
Platelets: 155 10*3/uL (ref 150.0–400.0)
RBC: 4.39 Mil/uL (ref 3.87–5.11)
RDW: 14 % (ref 11.5–14.6)
WBC: 5.9 10*3/uL (ref 4.5–10.5)

## 2012-06-14 LAB — BASIC METABOLIC PANEL
BUN: 21 mg/dL (ref 6–23)
CO2: 27 mEq/L (ref 19–32)
Calcium: 9.2 mg/dL (ref 8.4–10.5)
Chloride: 100 mEq/L (ref 96–112)
Creatinine, Ser: 0.9 mg/dL (ref 0.4–1.2)
GFR: 69 mL/min (ref >60.00)
Glucose, Bld: 116 mg/dL — ABNORMAL HIGH (ref 70–99)
Potassium: 4.2 mEq/L (ref 3.5–5.1)
Sodium: 135 mEq/L (ref 135–145)

## 2012-06-14 LAB — VITAMIN B12: Vitamin B-12: 373 pg/mL (ref 211–911)

## 2012-06-14 LAB — TSH: TSH: 2.55 u[IU]/mL (ref 0.35–5.50)

## 2012-06-14 LAB — MAGNESIUM: Magnesium: 2.1 mg/dL (ref 1.5–2.5)

## 2012-06-14 MED ORDER — LOSARTAN POTASSIUM-HCTZ 100-25 MG PO TABS: 1.0000 | ORAL_TABLET | Freq: Every day | ORAL | Status: DC

## 2012-06-14 NOTE — Progress Notes (Signed)
  Subjective:    Patient ID: Jordan Patterson, female    DOB: [birth year], 72 y.o.   MRN: [id number]  HPI CC: fatigue, vision changes  Saw Dr. Craige Cotta - asthma aggravated with allergies.  Started on inhalers (dulera and albuterol) and flonase nasal steroid.  Over last several weeks, feeling "totally wiped out", no energy, trouble sleeping 2/2 leg cramps.  Cramping mainly at night time.  Wonders if this is due to blood pressure medication.  On benicar hct 40/25mg  for at least 1 year.   Some sleep maintenance insomnia.    Noticing vision changes - decreased ability to see smaller print.  Denies visual field loss.  Last saw ophtho 02/2012.  Sees yearly.  Uses corrective bifocal lenses.  Snellen normal today.  Past Medical History  Diagnosis Date  . Hiatal hernia   . GERD (gastroesophageal reflux disease)     [name] EGD - mild esophageal dysmotility and small hiatal hernia  . Hypertension   . History of asthma   . Hyperlipidemia     mild, diet controlled  . Depression   . Hypothyroidism   . Osteopenia   . Diabetes type 2, controlled 2004    diet controlled  . Macular degeneration   . Arthritis     hands and knees  . Environmental allergies     dust,mold,mildew  . Hx of migraines   . Chronic headaches      Review of Systems     Objective:   Physical Exam  Nursing note and vitals reviewed. Constitutional: She appears well-developed and well-nourished. No distress.  HENT:  Head: Normocephalic and atraumatic.  Mouth/Throat: Oropharynx is clear and moist. No oropharyngeal exudate.  Eyes: Conjunctivae and EOM are normal. Pupils are equal, round, and reactive to light. No scleral icterus.  Neck: Normal range of motion. Neck supple. No thyromegaly present.  Cardiovascular: Normal rate, regular rhythm, normal heart sounds and intact distal pulses.   No murmur heard. Pulmonary/Chest: Effort normal and breath sounds normal. No respiratory distress. She has no wheezes. She has no rales.   Musculoskeletal: She exhibits no edema.  Skin: Skin is warm and dry. No rash noted. No pallor.  Psychiatric: She has a normal mood and affect.      Assessment & Plan:

## 2012-06-14 NOTE — Patient Instructions (Addendum)
Lets change from benicar hct to losartan hct - new medicine sent to pharmacy. Let's draw blood work today to check on other reversible causes of fatigue. Monitor vision - If progressive schedule eye exam.

## 2012-06-14 NOTE — Assessment & Plan Note (Signed)
Possibly benicar related - will do trial of losartan hct. Will also check for reversible causes of fatigue today. Pt agrees with plan.  For vision changes - snellen normal today. Suggested she schedule eye appt to check near vision, >1 yr since saw optometrist.  Saw macular specialist 02/2012.Marland Kitchen

## 2012-06-15 ENCOUNTER — Other Ambulatory Visit: Payer: Self-pay | Admitting: Family Medicine

## 2012-06-15 MED ORDER — TRIAMTERENE-HCTZ 37.5-25 MG PO CAPS: 1.0000 | ORAL_CAPSULE | ORAL | Status: DC

## 2012-07-14 ENCOUNTER — Encounter: Payer: Self-pay | Admitting: Family Medicine

## 2012-07-14 ENCOUNTER — Ambulatory Visit (INDEPENDENT_AMBULATORY_CARE_PROVIDER_SITE_OTHER): Payer: Medicare Other | Admitting: Family Medicine

## 2012-07-14 VITALS — BP 170/96 | HR 84 | Temp 97.3°F | Wt 175.8 lb

## 2012-07-14 DIAGNOSIS — R252 Cramp and spasm: Secondary | ICD-10-CM

## 2012-07-14 DIAGNOSIS — I10 Essential (primary) hypertension: Secondary | ICD-10-CM

## 2012-07-14 LAB — POTASSIUM: Potassium: 3.9 mEq/L (ref 3.5–5.3)

## 2012-07-14 NOTE — Addendum Note (Signed)
Addended by: Alvina Chou on: 07/14/2012 04:21 PM   Modules accepted: Orders

## 2012-07-14 NOTE — Patient Instructions (Signed)
Potassium checked today. Let's keep track of blood pressures- one in the morning and one later in the day.  If consistently >140/90, let me know. No changes to meds today.

## 2012-07-14 NOTE — Assessment & Plan Note (Signed)
Chronic.  Good control at home but only checking in am, here in office today elevated, along with HA's. On maxzide in am - I encouraged her to check BP later in the day - if consistently elevated >140/90, will likely restart benicar - at noon for better daytime coverage. Check potassium for h/o muscle cramps

## 2012-07-14 NOTE — Progress Notes (Signed)
  Subjective:    Patient ID: Jordan Patterson, female    DOB: 1940-03-24, 72 y.o.   MRN: [id number]  HPI CC: med refill  Saw Dr. Craige Cotta - asthma aggravated with allergies. Started on inhalers (dulera and albuterol) and flonase nasal steroid.  Cough improving.  HTN - benicar hct changed to maxzide 2/2 concern for fatigue.  Fatigue has improved, but noticing increased headaches - taking tylenol daily.  Also increased muscle cramping. No vision changes, CP/tightness, SOB, leg swelling. BP Readings from Last 3 Encounters:  07/14/12 144/92  06/14/12 128/86  06/01/12 142/86  bp at home have been running 120/70 (brings log) but only checks first thing in the mornings.  Past Medical History  Diagnosis Date  . Hiatal hernia   . GERD (gastroesophageal reflux disease)     [name] EGD - mild esophageal dysmotility and small hiatal hernia  . Hypertension   . History of asthma   . Hyperlipidemia     mild, diet controlled  . Depression   . Hypothyroidism   . Osteopenia   . Diabetes type 2, controlled 2004    diet controlled  . Macular degeneration   . Arthritis     hands and knees  . Environmental allergies     dust,mold,mildew  . Hx of migraines   . Chronic headaches      Review of Systems Per HPI    Objective:   Physical Exam  Nursing note and vitals reviewed. Constitutional: She appears well-developed and well-nourished. No distress.  HENT:  Head: Normocephalic and atraumatic.  Mouth/Throat: Oropharynx is clear and moist. No oropharyngeal exudate.  Eyes: Conjunctivae and EOM are normal. Pupils are equal, round, and reactive to light. No scleral icterus.  Neck: Normal range of motion. Neck supple.  Cardiovascular: Normal rate, regular rhythm, normal heart sounds and intact distal pulses.   No murmur heard. Pulmonary/Chest: Effort normal and breath sounds normal. No respiratory distress. She has no wheezes. She has no rales.       Assessment & Plan:

## 2012-07-17 ENCOUNTER — Encounter: Payer: Self-pay | Admitting: Pulmonary Disease

## 2012-07-17 ENCOUNTER — Encounter: Payer: Self-pay | Admitting: Family Medicine

## 2012-07-18 NOTE — Telephone Encounter (Signed)
Dr. Craige Cotta,  I am experiencing severe cramps especially in my legs and I have done some research on the [facility] and  believe it is the cause. Is there something else I can use b/c it seems to be helping but I cannot endure  these cramps they are affecting my sleep.  Also, may I stop using the nasal spray until next allergy season - I have ballooned up by 10 #'s since I say you.  Thanks, Jordan Patterson   LMOM TCB x2 Jordan Patterson was started at 3.3.14 ov Was reported doing well at last ov 4.10.14

## 2012-07-18 NOTE — Telephone Encounter (Signed)
LMTCBx1.Larenz Frasier, CMA  

## 2012-07-19 ENCOUNTER — Telehealth: Payer: Self-pay | Admitting: Pulmonary Disease

## 2012-07-19 NOTE — Telephone Encounter (Signed)
Pt returning call from 5.27.14 mychart message: [facility] MESSAGE REPORT Message [[id number]]    From [name]   To Coralyn Helling, MD [P [phone]   Composed 07/17/2012 8:52 PM   For Delivery On 07/17/2012 8:52 PM   Subject Non-Urgent Medical Question   Message Type Patient Medical Advice Request   Read Status Y   Message Body Dr. Craige Cotta,  I am experiencing severe cramps especially in my legs and I have done some research on the [facility] and  believe it is the cause. Is there something else I can use b/c it seems to be helping but I cannot endure  these cramps they are affecting my sleep.  Also, may I stop using the nasal spray until next allergy season - I have ballooned up by 10 #'s since I say you.  Thanks, Jordan Patterson     Please see that document for additional information.

## 2012-07-19 NOTE — Telephone Encounter (Signed)
LMOM x 1 

## 2012-07-19 NOTE — Telephone Encounter (Signed)
Pt returned call in 5.28.14 phone note Will copy mychart message from patient and close

## 2012-07-19 NOTE — Telephone Encounter (Signed)
LMTCBx1.Terria Deschepper, CMA  

## 2012-07-19 NOTE — Telephone Encounter (Signed)
Pt is returning triage's call & asked to be reached at [phone].  Jordan Patterson

## 2012-07-20 NOTE — Telephone Encounter (Signed)
She can try stopping dulera and flonase.  She should continue singulair, zyrtec, and proair.  She should call back if her breathing or allergies get worse after change to her medicines.

## 2012-07-20 NOTE — Telephone Encounter (Signed)
Spoke with pt  She states that approx 6 wks after starting dulera she started having cramping in legs The cramps only bother her at night, and have become so bothersome she is not sleeping well She states that she wants to stop dulera b/c she read that this is a side effect of med She states also wants to stop the flonase since this is a steroid  She states does not like taking steroid med b/c it causes her to gain wt VS, please advise thanks! Allergies  Allergen Reactions  . Ace Inhibitors Cough  . Amlodipine Cough  . Pregabalin   . Tegretol (Carbamazepine)     Dizziness, headache  . Wellbutrin (Bupropion Hcl)   . Cymbalta (Duloxetine Hcl) Palpitations    And headaches

## 2012-07-20 NOTE — Telephone Encounter (Signed)
Pt advised. Jennifer Castillo, CMA  

## 2012-07-23 ENCOUNTER — Encounter: Payer: Self-pay | Admitting: Family Medicine

## 2012-07-24 NOTE — Telephone Encounter (Signed)
See BP log from patient

## 2012-07-27 ENCOUNTER — Encounter: Payer: Self-pay | Admitting: Family Medicine

## 2012-08-14 ENCOUNTER — Other Ambulatory Visit: Payer: Self-pay | Admitting: Family Medicine

## 2012-08-22 ENCOUNTER — Encounter: Payer: Self-pay | Admitting: Family Medicine

## 2012-08-22 DIAGNOSIS — J449 Chronic obstructive pulmonary disease, unspecified: Secondary | ICD-10-CM

## 2012-08-22 DIAGNOSIS — H1010 Acute atopic conjunctivitis, unspecified eye: Secondary | ICD-10-CM

## 2012-08-31 ENCOUNTER — Other Ambulatory Visit: Payer: Self-pay

## 2012-09-25 ENCOUNTER — Encounter: Payer: Self-pay | Admitting: Family Medicine

## 2012-09-27 ENCOUNTER — Ambulatory Visit (INDEPENDENT_AMBULATORY_CARE_PROVIDER_SITE_OTHER): Payer: Medicare Other | Admitting: Pulmonary Disease

## 2012-09-27 ENCOUNTER — Encounter: Payer: Self-pay | Admitting: Pulmonary Disease

## 2012-09-27 VITALS — BP 112/70 | HR 77 | Temp 97.9°F | Ht 68.5 in | Wt 181.0 lb

## 2012-09-27 DIAGNOSIS — J449 Chronic obstructive pulmonary disease, unspecified: Secondary | ICD-10-CM

## 2012-09-27 NOTE — Assessment & Plan Note (Signed)
Much of this is likely related to her allergies.  She is to continue spiriva, singulair, and proair.  She will follow up with Dr. Elkton Callas to assess for allergy shots.    She is reluctant to used steroid inhalers >> I explained to her that steroid inhalers are best option for treating asthma.  Since she will likely be seeing Dr. Fillmore Callas on a frequent basis with her allergy shots, I have suggested she could decrease the frequency of when she is seen in pulmonary office.

## 2012-09-27 NOTE — Progress Notes (Signed)
Chief Complaint  Patient presents with  . Follow-up    No change in breathing since last OV. Increased cough and sob--mostly in AM and while eating.     CC: [name]  History of Present Illness: Jordan Patterson is a 72 y.o. female with chronic cough related to allergic rhinitis, post-nasal drip, and asthma.   She was previously on dulera.  She had to stop this because she had leg cramps >> her cramps went away after stopping dulera.    She is reluctant to use any steroid inhalers (either for her sinuses or her lungs) >> she is concerned these will cause weight gain.  She has been using albuterol bid and this helps.    She was seen recently by Dr. Allen Callas, and will likely be started on allergy shots with in the next month.  She was also started on spiriva by Dr. [place] Callas >> she is not sure if this is helping.  She still has cough and wheeze with chest congestion >> this happens mostly in the morning.  She still has her pet cat.  She was advised by Dr. Funkley Callas to get an Epi-pen.  Tests: Allergy test 12/08/07 >> Perennial rye 2+, Black walnut pollen 3+, Tree mix 4+, Dust mite 4+, Cat/Dog 4+ PFT 06/01/12 >> FEV1 1.88 (80%), FEV1% 66, TLC 5.63 (100%), RV 134%, DLCO 78%, +BD   Jordan Patterson  has a past medical history of Hiatal hernia; GERD (gastroesophageal reflux disease); Hypertension; Extrinsic asthma; Hyperlipidemia; Depression; Hypothyroidism; Osteopenia; Diabetes type 2, controlled (2004); Macular degeneration; Arthritis; Environmental allergies; migraines; Chronic headaches; and Allergic rhinitis.  Jordan Patterson  has past surgical history that includes Dilation and curettage of uterus (1978); Tonsillectomy and adenoidectomy (1962); Laparoscopy abdomen diagnostic; Esophagogastroduodenoscopy (06/25/2011); Savory dilation (06/25/2011); Cataract extraction (2003); dexa (2010); and Colonoscopy.  Prior to Admission medications   Medication Sig Start Date End Date Taking? Authorizing Provider   AMBULATORY NON FORMULARY MEDICATION [facility] As directed   Yes Historical Provider, MD  cetirizine (ZYRTEC) 10 MG tablet Take 10 mg by mouth daily.   Yes Historical Provider, MD  Cholecalciferol (VITAMIN D-3 PO) Take 4,000 mg by mouth daily.   Yes Historical Provider, MD  Cinnamon 500 MG TABS Take 1 tablet by mouth 2 (two) times daily.    Yes Historical Provider, MD  Coenzyme Q10 (COQ10) 100 MG CAPS Take 1 capsule by mouth daily.   Yes Historical Provider, MD  cycloSPORINE (RESTASIS) 0.05 % ophthalmic emulsion Place 1 drop into both eyes 2 (two) times daily.    Yes Historical Provider, MD  levothyroxine (SYNTHROID, LEVOTHROID) 88 MCG tablet Take 1 tablet (88 mcg total) by mouth daily. 04/18/12  Yes Eustaquio Boyden, MD  loteprednol (LOTEMAX) 0.5 % ophthalmic suspension Apply 1 drop to eye as needed.    Yes Historical Provider, MD  olmesartan-hydrochlorothiazide (BENICAR HCT) 40-25 MG per tablet Take 1 tablet by mouth daily. 10/21/11  Yes Eustaquio Boyden, MD  omeprazole (PRILOSEC OTC) 20 MG tablet Take 20 mg by mouth daily.   Yes Historical Provider, MD  Probiotic Product (ALIGN) 4 MG CAPS Take 1 capsule by mouth daily.   Yes Historical Provider, MD  Valerian Root 500 MG CAPS Take 1 capsule by mouth 2 (two) times daily.   Yes Historical Provider, MD    Allergies  Allergen Reactions  . Ace Inhibitors Cough  . Amlodipine Cough  . Pregabalin   . Tegretol (Carbamazepine)     Dizziness, headache  . Wellbutrin (Bupropion  Hcl)   . Cymbalta (Duloxetine Hcl) Palpitations    And headaches     Physical Exam:  General - No distress ENT - No sinus tenderness, no oral exudate, no LAN Cardiac - s1s2 regular, no murmur Chest - No wheeze/rales/dullness Back - No focal tenderness Abd - Soft, non-tender Ext - No edema Neuro - Normal strength Skin - No rashes Psych - Normal mood, and behavior  Assessment/Plan:  Jordan Helling, MD [facility] Pager:   [phone] 09/27/2012, 4:43 PM

## 2012-09-27 NOTE — Patient Instructions (Signed)
Follow up in 1 year.

## 2012-10-22 ENCOUNTER — Encounter: Payer: Self-pay | Admitting: Family Medicine

## 2012-10-22 ENCOUNTER — Encounter: Payer: Self-pay | Admitting: Pulmonary Disease

## 2012-10-24 ENCOUNTER — Telehealth: Payer: Self-pay | Admitting: Pulmonary Disease

## 2012-10-24 NOTE — Telephone Encounter (Signed)
I [id number] to speak with the pt to see if her cough is productive, is it worse, better same? How many times is she using rescue inhaler, etc? Carron Curie, CMA

## 2012-10-24 NOTE — Telephone Encounter (Addendum)
Please advise pt that steroid inhalers would likely be best option for control of her asthma.  I am aware she has reluctance to use steroid inhalers due to leg cramps from dulera, and concern for weight gain.  If she is willing, then could prescribe low dose of inhaled steroid with least amount of systemic absorption to determine if this improves her symptoms.  Otherwise she should discuss further with Dr. [employer] Callas about whether she is candidate for allergy shots or xolair to control her asthma.

## 2012-10-24 NOTE — Telephone Encounter (Signed)
We received this message from MyChart:  Dr. Craige Cotta, Dr. Hardy Callas did the test to measure the nitric oxide in my airways. The FeNO number is 38 ppb. The nurse who did the test said the number is a bit high. I am wondering if any of my medications  need to be adjusted. I am having tightness in the airway and still coughing, the rescue inhaler is a  very temporary help. I cannot tell that the Spiriva is helping.   Dr Craige Cotta - please advise. Thanks.

## 2012-10-25 NOTE — Telephone Encounter (Signed)
My Chart message has been sent to the patient.

## 2012-10-25 NOTE — Telephone Encounter (Signed)
Returning call can be reached at [phone].Raylene Everts

## 2012-10-26 NOTE — Telephone Encounter (Signed)
Patient e-mail was not sent to pt. She called back. I made her aware of VS recs from phone note 10/25/12. She stated she will discuss options with Dr. [place] Callas. She does not want to try the inhaled steriods. Nothing further needed

## 2012-11-28 ENCOUNTER — Encounter: Payer: Self-pay | Admitting: Family Medicine

## 2012-11-28 ENCOUNTER — Other Ambulatory Visit: Payer: Self-pay

## 2012-11-28 DIAGNOSIS — Z1231 Encounter for screening mammogram for malignant neoplasm of breast: Secondary | ICD-10-CM

## 2012-11-30 ENCOUNTER — Ambulatory Visit
Admission: RE | Admit: 2012-11-30 | Discharge: 2012-11-30 | Disposition: A | Discharge status: Home / Self Care | Payer: Medicare Other | Source: Ambulatory Visit

## 2012-11-30 DIAGNOSIS — Z1231 Encounter for screening mammogram for malignant neoplasm of breast: Secondary | ICD-10-CM

## 2012-12-14 ENCOUNTER — Encounter: Payer: Medicare Other | Admitting: Family Medicine

## 2012-12-14 ENCOUNTER — Other Ambulatory Visit: Payer: Self-pay | Admitting: Family Medicine

## 2012-12-28 ENCOUNTER — Other Ambulatory Visit: Payer: Self-pay

## 2013-01-01 ENCOUNTER — Encounter: Payer: Self-pay | Admitting: Family Medicine

## 2013-01-08 ENCOUNTER — Other Ambulatory Visit: Payer: Medicare Other

## 2013-01-09 ENCOUNTER — Other Ambulatory Visit: Payer: Self-pay | Admitting: Family Medicine

## 2013-01-09 ENCOUNTER — Other Ambulatory Visit (INDEPENDENT_AMBULATORY_CARE_PROVIDER_SITE_OTHER): Payer: Medicare Other

## 2013-01-09 DIAGNOSIS — E039 Hypothyroidism, unspecified: Secondary | ICD-10-CM

## 2013-01-09 DIAGNOSIS — I10 Essential (primary) hypertension: Secondary | ICD-10-CM

## 2013-01-09 DIAGNOSIS — R5381 Other malaise: Secondary | ICD-10-CM

## 2013-01-09 DIAGNOSIS — E785 Hyperlipidemia, unspecified: Secondary | ICD-10-CM

## 2013-01-09 DIAGNOSIS — E119 Type 2 diabetes mellitus without complications: Secondary | ICD-10-CM

## 2013-01-09 DIAGNOSIS — R5383 Other fatigue: Secondary | ICD-10-CM

## 2013-01-09 LAB — BASIC METABOLIC PANEL
BUN: 15 mg/dL (ref 6–23)
CO2: 29 mEq/L (ref 19–32)
Calcium: 9.2 mg/dL (ref 8.4–10.5)
Chloride: 102 mEq/L (ref 96–112)
Creatinine, Ser: 0.9 mg/dL (ref 0.4–1.2)
GFR: 67.98 mL/min (ref >60.00)
Glucose, Bld: 136 mg/dL — ABNORMAL HIGH (ref 70–99)
Potassium: 4.3 mEq/L (ref 3.5–5.1)
Sodium: 138 mEq/L (ref 135–145)

## 2013-01-09 LAB — TSH: TSH: 2.72 u[IU]/mL (ref 0.35–5.50)

## 2013-01-09 LAB — LIPID PANEL
Cholesterol: 232 mg/dL — ABNORMAL HIGH (ref 0–200)
HDL: 42.7 mg/dL (ref >39.00)
Total CHOL/HDL Ratio: 5
Triglycerides: 249 mg/dL — ABNORMAL HIGH (ref 0.0–149.0)
VLDL: 49.8 mg/dL — ABNORMAL HIGH (ref 0.0–40.0)

## 2013-01-09 LAB — HEMOGLOBIN A1C: Hgb A1c MFr Bld: 6.8 % — ABNORMAL HIGH (ref 4.6–6.5)

## 2013-01-09 LAB — LDL CHOLESTEROL, DIRECT: Direct LDL: 151.3 mg/dL

## 2013-01-10 ENCOUNTER — Ambulatory Visit (INDEPENDENT_AMBULATORY_CARE_PROVIDER_SITE_OTHER): Payer: Medicare Other | Admitting: Internal Medicine

## 2013-01-10 ENCOUNTER — Encounter: Payer: Self-pay | Admitting: Internal Medicine

## 2013-01-10 VITALS — BP 142/86 | HR 72 | Ht 68.5 in | Wt 183.0 lb

## 2013-01-10 DIAGNOSIS — K219 Gastro-esophageal reflux disease without esophagitis: Secondary | ICD-10-CM

## 2013-01-10 DIAGNOSIS — R1319 Other dysphagia: Secondary | ICD-10-CM

## 2013-01-10 LAB — MICROALBUMIN / CREATININE URINE RATIO
Creatinine,U: 49.6 mg/dL
Microalb Creat Ratio: 1 mg/g (ref 0.0–30.0)
Microalb, Ur: 0.5 mg/dL (ref 0.0–1.9)

## 2013-01-10 MED ORDER — OMEPRAZOLE 40 MG PO CPDR: 40.0000 mg | DELAYED_RELEASE_CAPSULE | Freq: Two times a day (BID) | ORAL | Status: DC

## 2013-01-10 NOTE — Progress Notes (Signed)
Jordan Patterson [birth year] MRN [id number]   History of Present Illness:  This is a 72 year old white female with progressive heartburn and difficulty in swallowing. She has a chronic cough and suspected LPR. 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 On the last endoscopy in May 2013, there was no stricture, but after passing 17 mm Savary dilators her symptoms improved. She was on omeprazole 20 mg twice a day which she has reduced to 20 mg once a day. She has been followed by Dr. Craige Cotta for allergic rhinitis and asthma. She is on multiple inhalers. Patient follows reflux measures. She has gained some weight. Dysphagia occurs only intermittently and mostly to solid foods. She usually has to get up and walk around in order for the food to pass.   Past Medical History  Diagnosis Date  . Hiatal hernia   . GERD (gastroesophageal reflux disease)     Jordan Patterson) EGD - mild esophageal dysmotility and small hiatal hernia  . Hypertension   . Extrinsic asthma   . Hyperlipidemia     mild, diet controlled  . Depression   . Hypothyroidism   . Osteopenia   . Diabetes type 2, controlled 2004    diet controlled  . Macular degeneration   . Arthritis     hands and knees  . Environmental allergies     dust,mold,mildew  . Hx of migraines   . Chronic headaches   . Allergic rhinitis    Past Surgical History  Procedure Laterality Date  . Dilation and curettage of uterus  1978  . Tonsillectomy and adenoidectomy  1962  . Laparoscopy abdomen diagnostic      To R/O endometriosis  . Esophagogastroduodenoscopy  06/25/2011    Procedure: ESOPHAGOGASTRODUODENOSCOPY (EGD);  Surgeon: Hart Carwin, MD;  Location: [facility];  Service: Endoscopy;  Laterality: N/A;  no Xray  . Savory dilation  06/25/2011    Procedure: SAVORY DILATION;  Surgeon: Hart Carwin, MD;  Location: [facility];  Service: Endoscopy;  Laterality: N/A;  . Cataract extraction  2003    bilateral  . Dexa  2010    improved (initial DEXA mild osteopenia)  . Colonoscopy      reports that she has never smoked. She has never used smokeless tobacco. She reports that she does not drink alcohol or use illicit drugs. family history includes Coronary artery disease in her maternal grandfather; Diabetes in her father; Hypertension in her mother; Mitral valve prolapse in her father and mother; Stroke in her paternal grandfather; Stroke (age of onset: 66) in her mother. There is no history of Cancer. Allergies  Allergen Reactions  . Ace Inhibitors Cough  . Amlodipine Cough  . Pregabalin   . Tegretol [Carbamazepine]     Dizziness, headache  . Wellbutrin [Bupropion Hcl]   . Cymbalta [Duloxetine Hcl] Palpitations    And headaches        Review of Systems: Denies chest pain or shortness of breath change in bowel habits  The remainder of the 10 point ROS is negative except as outlined in H&P   Physical Exam: General appearance  Well developed, in no distress. Eyes- non icteric. HEENT nontraumatic, normocephalic. Mouth no lesions, tongue papillated, no cheilosis. Neck supple without adenopathy, thyroid not enlarged, no carotid bruits, no JVD. Lungs Clear to auscultation bilaterally. No wheezes or rales, occasional cough. Cor normal  S1, normal S2, regular rhythm, no murmur,  quiet precordium. Abdomen: Soft, nontender. Rectal: Not done. Extremities no pedal edema. Skin no lesions. Neurological alert and oriented x 3. Psychological normal mood and affect.  Assessment and Plan:  Problem #50 72 year old, white female with chronic gastroesophageal reflux, esophageal dysmotility and dysphagia which responded to esophageal dilation although no stricture was seen on endoscopy. I think we will first increase her therapy in order to control the reflux . She will start Prilosec  40 mg twice a day and follow strict antireflux measures. If she does not improve after 4 weeks, we will proceed with an upper endoscopy and possible dilation. She agrees with the plan.   01/10/2013 [name]

## 2013-01-10 NOTE — Patient Instructions (Addendum)
We have sent the following medications to your pharmacy for you to pick up at your convenience: Omeprazole 40 mg twice daily.  Please contact us in 2-3 weeks to update [place] on your status. Call us at [phone] and ask to speak to [facility] or use mychart. Should your condition not improve, it may be necessary to schedule you for an endoscopy.  CC: Dr Sharen Hones

## 2013-01-15 ENCOUNTER — Other Ambulatory Visit: Payer: Self-pay | Admitting: Family Medicine

## 2013-01-15 ENCOUNTER — Encounter: Payer: Medicare Other | Admitting: Family Medicine

## 2013-01-16 ENCOUNTER — Other Ambulatory Visit: Payer: Self-pay | Admitting: *Deleted

## 2013-01-16 MED ORDER — ALBUTEROL SULFATE HFA 108 (90 BASE) MCG/ACT IN AERS: 2.0000 | INHALATION_SPRAY | Freq: Four times a day (QID) | RESPIRATORY_TRACT | Status: DC | PRN

## 2013-01-22 ENCOUNTER — Ambulatory Visit (INDEPENDENT_AMBULATORY_CARE_PROVIDER_SITE_OTHER): Payer: Medicare Other | Admitting: Family Medicine

## 2013-01-22 ENCOUNTER — Encounter: Payer: Self-pay | Admitting: Family Medicine

## 2013-01-22 VITALS — BP 128/74 | HR 84 | Temp 98.0°F | Ht 68.0 in | Wt 183.0 lb

## 2013-01-22 DIAGNOSIS — Z0000 Encounter for general adult medical examination without abnormal findings: Secondary | ICD-10-CM

## 2013-01-22 DIAGNOSIS — I10 Essential (primary) hypertension: Secondary | ICD-10-CM

## 2013-01-22 DIAGNOSIS — M899 Disorder of bone, unspecified: Secondary | ICD-10-CM

## 2013-01-22 DIAGNOSIS — K219 Gastro-esophageal reflux disease without esophagitis: Secondary | ICD-10-CM

## 2013-01-22 DIAGNOSIS — E785 Hyperlipidemia, unspecified: Secondary | ICD-10-CM

## 2013-01-22 DIAGNOSIS — M8580 Other specified disorders of bone density and structure, unspecified site: Secondary | ICD-10-CM

## 2013-01-22 DIAGNOSIS — E039 Hypothyroidism, unspecified: Secondary | ICD-10-CM

## 2013-01-22 DIAGNOSIS — E119 Type 2 diabetes mellitus without complications: Secondary | ICD-10-CM

## 2013-01-22 DIAGNOSIS — Z1211 Encounter for screening for malignant neoplasm of colon: Secondary | ICD-10-CM

## 2013-01-22 NOTE — Assessment & Plan Note (Signed)
Chronic, stable. Continue maxizde

## 2013-01-22 NOTE — Assessment & Plan Note (Signed)
I have personally reviewed the Medicare Annual Wellness questionnaire and have noted 1. The patient's medical and social history 2. Their use of alcohol, tobacco or illicit drugs 3. Their current medications and supplements 4. The patient's functional ability including ADL's, fall risks, home safety risks and hearing or visual impairment. 5. Diet and physical activity 6. Evidence for depression or mood disorders The patients weight, height, BMI have been recorded in the chart.  Hearing and vision has been addressed. I have made referrals, counseling and provided education to the patient based review of the above and I have provided the pt with a written personalized care plan for preventive services. See scanned questionairre. Advanced directives discussed: pt will bring me copy.  Daughter is HCPOA  Reviewed preventative protocols and updated unless pt declined.

## 2013-01-22 NOTE — Assessment & Plan Note (Signed)
-   stable on current regimen 

## 2013-01-22 NOTE — Patient Instructions (Signed)
Sign release form from [facility] for flexible sigmoidoscopy from 2007 and for bone density scan from [place]. Stool kit today. Call your insurace about the shingles shot to see if it is covered or how much it would cost and where is cheaper (here or pharmacy).  If you want to receive here, call for nurse visit. Think about pneumonia shot. Bring me copy of advanced directive to update your chart.

## 2013-01-22 NOTE — Assessment & Plan Note (Signed)
Seems to be improving on increased PPI dose.

## 2013-01-22 NOTE — Progress Notes (Signed)
Subjective:    Patient ID: Jordan Patterson, female    DOB: [birth year], 72 y.o.   MRN: [id number]  HPI CC: medicare wellness visit, subsequent  Wt Readings from Last 3 Encounters:  01/22/13 183 lb (83.008 kg)  01/10/13 183 lb (83.008 kg)  09/27/12 181 lb (82.101 kg)  Weight gain noted - walking on treadmill or outside 3x/wk for 15-20 min.  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 Annia Friendly, per pt normal.  Stool kit today. Mammogram - last in chart 2009. Last 11/2012 normal.  Declines breast exam today.  Pap - always normal pap smear/pelvic exams. Prior received by PCP. May consider seeing OBGYN for this at [facility]. Dexa 2010 osteopenia. Taking vit D.  Flu shot done Pneumovax - 2007 Tetanus - today Shingles shot - states had shingles. Declines shingles shot Advanced directives: has living will at home. Daughter is HCPOA.  Hearing screen borderline. Has seen audiologist in past. Did not need hearing aides at that time. Told not severe hearing loss.  Vision screen passed today Denies depression/anxiety. Sad because lonely after widowed. Denies anhedonia.  No falls in last year.   Medications and allergies reviewed and updated in chart.  Past histories reviewed and updated if relevant as below. Patient Active Problem List   Diagnosis Date Noted  . Fatigue 06/14/2012  . Allergic conjunctivitis and rhinitis 05/15/2012  . 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 Medicare annual wellness visit, subsequent 01/14/2012  . Hiatal hernia   . GERD (gastroesophageal reflux disease)   . Hypertension   . Hyperlipidemia   . Depression   . Hypothyroidism   . Osteopenia   . Diabetes type 2, controlled   . Arthritis    Past Medical History  Diagnosis Date    . Hiatal hernia   . GERD (gastroesophageal reflux disease)     [name] EGD - mild esophageal dysmotility and small hiatal hernia  . Hypertension   . Extrinsic asthma   . Hyperlipidemia     mild, diet controlled  . Depression   . Hypothyroidism   . Osteopenia   . Diabetes type 2, controlled 2004    diet controlled  . Macular degeneration   . Arthritis     hands and knees  . Environmental allergies     dust,mold,mildew  . Hx of migraines   . Chronic headaches   . Allergic rhinitis    Past Surgical History  Procedure Laterality Date  . Dilation and curettage of uterus  1978  . Tonsillectomy and adenoidectomy  1962  . Laparoscopy abdomen diagnostic      To R/O endometriosis  . Esophagogastroduodenoscopy  06/25/2011    Procedure: ESOPHAGOGASTRODUODENOSCOPY (EGD);  Surgeon: Hart Carwin, MD;  Location: [facility];  Service: Endoscopy;  Laterality: N/A;  no Xray  . Savory dilation  06/25/2011    Procedure: SAVORY DILATION;  Surgeon: Hart Carwin, MD;  Location: [facility];  Service: Endoscopy;  Laterality: N/A;  . Cataract extraction  2003    bilateral  . Dexa  2010    improved (initial DEXA mild osteopenia)  . Colonoscopy     History  Substance Use Topics  . Smoking status: Never Smoker   . Smokeless tobacco: Never Used  . Alcohol Use: No  Family History  Problem Relation Age of Onset  . Mitral valve prolapse Father   . Mitral valve prolapse Mother   . Hypertension Mother   . Diabetes Father   . Stroke Mother 89    after valve surgery  . Stroke Paternal Grandfather   . Coronary artery disease Maternal Grandfather   . Cancer Neg Hx    Allergies  Allergen Reactions  . Ace Inhibitors Cough  . Amlodipine Cough  . Pregabalin   . Tegretol [Carbamazepine]     Dizziness, headache  . Wellbutrin [Bupropion Hcl]   . Cymbalta [Duloxetine Hcl] Palpitations    And headaches   Current Outpatient Prescriptions on File Prior to Visit  Medication Sig Dispense Refill   . albuterol (PROAIR HFA) 108 (90 BASE) MCG/ACT inhaler Inhale 2 puffs into the lungs every 6 (six) hours as needed for wheezing or shortness of breath.  1 Inhaler  5  . AMBULATORY NON FORMULARY MEDICATION [facility] As directed      . Cinnamon 500 MG TABS Take 1 tablet by mouth 2 (two) times daily.       . Coenzyme Q10 (COQ10) 100 MG CAPS Take 1 capsule by mouth daily.      Marland Kitchen levocetirizine (XYZAL) 5 MG tablet Take 5 mg by mouth every evening.      . montelukast (SINGULAIR) 10 MG tablet Take 1 tablet (10 mg total) by mouth at bedtime.  30 tablet  5  . Multiple Vitamins-Minerals (MULTIVITAMIN PO) Take 1 tablet by mouth daily.      . NON FORMULARY Saline irrigation 2-3 times daily      . NON FORMULARY Allergy Injections bi-weekly      . omeprazole (PRILOSEC) 40 MG capsule Take 1 capsule (40 mg total) by mouth 2 (two) times daily.  60 capsule  10  . Probiotic Product (ALIGN) 4 MG CAPS Take 1 capsule by mouth daily.      Marland Kitchen Spacer/Aero-Holding Chambers (AEROCHAMBER MV) inhaler Use as instructed  1 each  0  . SYNTHROID 88 MCG tablet TAKE 1 TABLET BY MOUTH DAILY  30 tablet  3  . triamterene-hydrochlorothiazide (DYAZIDE) 37.5-25 MG per capsule TAKE ONE CAPSULE BY MOUTH EACH MORNING  30 capsule  3  . Valerian Root 500 MG CAPS Take 1 capsule by mouth 2 (two) times daily.       No current facility-administered medications on file prior to visit.     Review of Systems  Constitutional: Negative for fever, chills, activity change, appetite change, fatigue and unexpected weight change.  HENT: Negative for hearing loss.   Eyes: Negative for visual disturbance.  Respiratory: Negative for cough, chest tightness, shortness of breath and wheezing.   Cardiovascular: Negative for chest pain, palpitations and leg swelling.  Gastrointestinal: Negative for nausea, vomiting, abdominal pain, diarrhea, constipation, blood in stool and abdominal distention.  Genitourinary: Negative for hematuria and difficulty  urinating.  Musculoskeletal: Negative for arthralgias, myalgias and neck pain.  Skin: Negative for rash.  Neurological: Negative for dizziness, seizures, syncope and headaches.  Hematological: Negative for adenopathy. Does not bruise/bleed easily.  Psychiatric/Behavioral: Negative for dysphoric mood. The patient is not nervous/anxious.        Objective:   Physical Exam  Nursing note and vitals reviewed. Constitutional: She is oriented to person, place, and time. She appears well-developed and well-nourished. No distress.  HENT:  Head: Normocephalic and atraumatic.  Right Ear: External ear normal.  Left Ear: External ear normal.  Nose: Nose normal.  Mouth/Throat:  Oropharynx is clear and moist. No oropharyngeal exudate.  Eyes: Conjunctivae and EOM are normal. Pupils are equal, round, and reactive to light. No scleral icterus.  Neck: Normal range of motion. Neck supple. Carotid bruit is not present. No thyromegaly present.  Cardiovascular: Normal rate, regular rhythm, normal heart sounds and intact distal pulses.   No murmur heard. Pulses:      Radial pulses are 2+ on the right side, and 2+ on the left side.  Pulmonary/Chest: Effort normal and breath sounds normal. No respiratory distress. She has no wheezes. She has no rales.  Abdominal: Soft. Bowel sounds are normal. She exhibits no distension and no mass. There is no tenderness. There is no rebound and no guarding.  Musculoskeletal: Normal range of motion. She exhibits no edema.  Diabetic foot exam: Normal inspection No skin breakdown No calluses  Normal DP/PT pulses Normal sensation to light tough and monofilament Nails normal, except right great toenail onychomycotic  Lymphadenopathy:    She has no cervical adenopathy.  Neurological: She is alert and oriented to person, place, and time.  CN grossly intact, station and gait intact  Skin: Skin is warm and dry. No rash noted.  Psychiatric: She has a normal mood and affect. Her  behavior is normal. Judgment and thought content normal.       Assessment & Plan:

## 2013-01-22 NOTE — Assessment & Plan Note (Signed)
Reviewed increasing trend of A1c, discussed weight loss by increased exercise.

## 2013-01-22 NOTE — Assessment & Plan Note (Signed)
I have requested latest dexa records from [facility] - ?2002, may need DEXA updated.

## 2013-01-22 NOTE — Progress Notes (Signed)
Pre-visit discussion using our clinic review tool. No additional management support is needed unless otherwise documented below in the visit note.  

## 2013-01-22 NOTE — Assessment & Plan Note (Signed)
Reviewed #s with patient, recheck in 6 mo

## 2013-01-25 ENCOUNTER — Encounter: Payer: Self-pay | Admitting: Family Medicine

## 2013-02-13 ENCOUNTER — Other Ambulatory Visit: Payer: Medicare Other

## 2013-02-13 DIAGNOSIS — Z1211 Encounter for screening for malignant neoplasm of colon: Secondary | ICD-10-CM

## 2013-02-13 LAB — FECAL OCCULT BLOOD, IMMUNOCHEMICAL: Fecal Occult Bld: NEGATIVE

## 2013-02-24 ENCOUNTER — Encounter: Payer: Self-pay | Admitting: Family Medicine

## 2013-02-26 ENCOUNTER — Encounter: Payer: Self-pay | Admitting: Family Medicine

## 2013-02-26 ENCOUNTER — Other Ambulatory Visit: Payer: Self-pay | Admitting: *Deleted

## 2013-02-26 DIAGNOSIS — E119 Type 2 diabetes mellitus without complications: Secondary | ICD-10-CM

## 2013-02-26 MED ORDER — GLUCOSE BLOOD VI STRP: ORAL_STRIP | Status: DC

## 2013-02-27 ENCOUNTER — Other Ambulatory Visit: Payer: Self-pay | Admitting: *Deleted

## 2013-02-27 DIAGNOSIS — E119 Type 2 diabetes mellitus without complications: Secondary | ICD-10-CM

## 2013-02-27 MED ORDER — GLUCOSE BLOOD VI STRP: ORAL_STRIP | Status: DC

## 2013-03-12 ENCOUNTER — Encounter: Payer: Self-pay | Admitting: Family Medicine

## 2013-03-19 ENCOUNTER — Ambulatory Visit (INDEPENDENT_AMBULATORY_CARE_PROVIDER_SITE_OTHER): Payer: Medicare Other | Admitting: Family Medicine

## 2013-03-19 ENCOUNTER — Encounter: Payer: Self-pay | Admitting: Family Medicine

## 2013-03-19 VITALS — BP 126/82 | HR 80 | Temp 98.3°F | Wt 185.2 lb

## 2013-03-19 DIAGNOSIS — R05 Cough: Secondary | ICD-10-CM

## 2013-03-19 DIAGNOSIS — R059 Cough, unspecified: Secondary | ICD-10-CM

## 2013-03-19 DIAGNOSIS — R058 Other specified cough: Secondary | ICD-10-CM | POA: Insufficient documentation

## 2013-03-19 MED ORDER — BENZONATATE 100 MG PO CAPS: 100.0000 mg | ORAL_CAPSULE | Freq: Three times a day (TID) | ORAL | Status: DC | PRN

## 2013-03-19 NOTE — Progress Notes (Signed)
   Subjective:    Patient ID: Jordan HorsemanJean C Rodino, female    DOB: 18-Apr-1940, 73 y.o.   MRN: [id number]  HPI CC: "I can't get rid of this cough"  Cough started 3d ago after she visited aunt at nursing home.  Cough keeping her up at night and during the day as well. Dry nagging cough. + headache and soreness in chest from cough.  No fevers/chills, cough not productive, no congestion, dyspnea or wheezing.  No myalgias.  Highly allergic to dust mites.  Has been taking allergy shots since 09/2012 Also using xyzal and singulair and albuterol inhaler.  Has been taking tylenol and benadryl every 4 hours No sick contacts at home. No smokers at home.  Past Medical History  Diagnosis Date  . Hiatal hernia   . GERD (gastroesophageal reflux disease)     [name]([facility]) EGD - mild esophageal dysmotility and small hiatal hernia  . Hypertension   . Extrinsic asthma   . Hyperlipidemia     mild, diet controlled  . Depression   . Hypothyroidism   . Osteopenia   . Diabetes type 2, controlled 2004    diet controlled  . Macular degeneration   . Arthritis     hands and knees  . Environmental allergies     dust,mold,mildew  . Hx of migraines   . Chronic headaches   . Allergic rhinitis      Review of Systems Per HPI    Objective:   Physical Exam  Nursing note and vitals reviewed. Constitutional: She appears well-developed and well-nourished. No distress.  HENT:  Head: Normocephalic and atraumatic.  Right Ear: Hearing, tympanic membrane, external ear and ear canal normal.  Left Ear: Hearing, tympanic membrane, external ear and ear canal normal.  Nose: Mucosal edema present. No rhinorrhea. Right sinus exhibits no maxillary sinus tenderness and no frontal sinus tenderness. Left sinus exhibits no maxillary sinus tenderness and no frontal sinus tenderness.  Mouth/Throat: Uvula is midline, oropharynx is clear and moist and mucous membranes are normal. No oropharyngeal exudate, posterior oropharyngeal edema,  posterior oropharyngeal erythema or tonsillar abscesses.  Nasal mucosal irritation R>L  Eyes: Conjunctivae and EOM are normal. Pupils are equal, round, and reactive to light. No scleral icterus.  Neck: Normal range of motion. Neck supple.  Cardiovascular: Normal rate, regular rhythm, normal heart sounds and intact distal pulses.   No murmur heard. Pulmonary/Chest: Effort normal and breath sounds normal. No respiratory distress. She has no wheezes. She has no rales.  Lymphadenopathy:    She has no cervical adenopathy.  Skin: Skin is warm and dry. No rash noted.       Assessment & Plan:

## 2013-03-19 NOTE — Progress Notes (Signed)
Pre-visit discussion using our clinic review tool. No additional management support is needed unless otherwise documented below in the visit note.  

## 2013-03-19 NOTE — Assessment & Plan Note (Signed)
After possible exposure to dust mites in h/o high allergies to this. Anticipate allergic rhinitis related cough. rec increased nasal saline and start tessalon perls as pt has had good response to this in past. Update if sxs persist or fail to improve.

## 2013-03-19 NOTE — Patient Instructions (Signed)
I do think this is an allergic cough. Continue meds as up to now. Continue nasal saline  Start tessalon perls.  Let me know if persistent cough or worsening.

## 2013-03-21 ENCOUNTER — Encounter: Payer: Self-pay | Admitting: Family Medicine

## 2013-03-21 MED ORDER — GUAIFENESIN-CODEINE 100-10 MG/5ML PO SYRP: 5.0000 mL | ORAL_SOLUTION | Freq: Two times a day (BID) | ORAL | Status: DC | PRN

## 2013-03-21 NOTE — Telephone Encounter (Signed)
plz call pt to offer codeine cough syrup for night time for cough if desired by pt.

## 2013-03-22 NOTE — Telephone Encounter (Signed)
Spoke with patient. She wanted to hold off on cough syrup for now. She said tessalon perls are working good at night. She will call me tomorrow if she changes her mind.

## 2013-03-25 LAB — HM DIABETES EYE EXAM

## 2013-04-15 ENCOUNTER — Other Ambulatory Visit: Payer: Self-pay | Admitting: Family Medicine

## 2013-04-21 ENCOUNTER — Encounter: Payer: Self-pay | Admitting: Pulmonary Disease

## 2013-04-23 MED ORDER — MONTELUKAST SODIUM 10 MG PO TABS: 10.0000 mg | ORAL_TABLET | Freq: Every day | ORAL | Status: DC

## 2013-05-16 ENCOUNTER — Other Ambulatory Visit: Payer: Self-pay | Admitting: Family Medicine

## 2013-06-19 ENCOUNTER — Other Ambulatory Visit: Payer: Self-pay | Admitting: Obstetrics and Gynecology

## 2013-06-19 ENCOUNTER — Other Ambulatory Visit (HOSPITAL_COMMUNITY)
Admission: RE | Admit: 2013-06-19 | Discharge: 2013-06-19 | Disposition: A | Discharge status: Home / Self Care | Payer: Medicare Other | Source: Ambulatory Visit | Attending: Obstetrics and Gynecology | Admitting: Obstetrics and Gynecology

## 2013-06-19 DIAGNOSIS — Z124 Encounter for screening for malignant neoplasm of cervix: Secondary | ICD-10-CM | POA: Insufficient documentation

## 2013-06-19 DIAGNOSIS — Z1151 Encounter for screening for human papillomavirus (HPV): Secondary | ICD-10-CM | POA: Insufficient documentation

## 2013-07-15 ENCOUNTER — Encounter: Payer: Self-pay | Admitting: Family Medicine

## 2013-07-19 ENCOUNTER — Encounter: Payer: Self-pay | Admitting: Family Medicine

## 2013-07-19 ENCOUNTER — Ambulatory Visit (INDEPENDENT_AMBULATORY_CARE_PROVIDER_SITE_OTHER): Payer: Medicare Other | Admitting: Family Medicine

## 2013-07-19 VITALS — BP 148/80 | HR 66 | Temp 98.4°F | Wt 183.0 lb

## 2013-07-19 DIAGNOSIS — M129 Arthropathy, unspecified: Secondary | ICD-10-CM

## 2013-07-19 DIAGNOSIS — R5381 Other malaise: Secondary | ICD-10-CM

## 2013-07-19 DIAGNOSIS — M949 Disorder of cartilage, unspecified: Secondary | ICD-10-CM

## 2013-07-19 DIAGNOSIS — E039 Hypothyroidism, unspecified: Secondary | ICD-10-CM

## 2013-07-19 DIAGNOSIS — M899 Disorder of bone, unspecified: Secondary | ICD-10-CM

## 2013-07-19 DIAGNOSIS — M8580 Other specified disorders of bone density and structure, unspecified site: Secondary | ICD-10-CM

## 2013-07-19 DIAGNOSIS — R5383 Other fatigue: Secondary | ICD-10-CM

## 2013-07-19 DIAGNOSIS — M1990 Unspecified osteoarthritis, unspecified site: Secondary | ICD-10-CM

## 2013-07-19 DIAGNOSIS — K219 Gastro-esophageal reflux disease without esophagitis: Secondary | ICD-10-CM

## 2013-07-19 DIAGNOSIS — E119 Type 2 diabetes mellitus without complications: Secondary | ICD-10-CM

## 2013-07-19 DIAGNOSIS — I10 Essential (primary) hypertension: Secondary | ICD-10-CM

## 2013-07-19 LAB — CBC WITH DIFFERENTIAL/PLATELET
Basophils Absolute: 0 10*3/uL (ref 0.0–0.1)
Basophils Relative: 0.6 % (ref 0.0–3.0)
Eosinophils Absolute: 0.1 10*3/uL (ref 0.0–0.7)
Eosinophils Relative: 2.6 % (ref 0.0–5.0)
HCT: 39 % (ref 36.0–46.0)
Hemoglobin: 13.4 g/dL (ref 12.0–15.0)
Lymphocytes Relative: 36.9 % (ref 12.0–46.0)
Lymphs Abs: 1.9 10*3/uL (ref 0.7–4.0)
MCHC: 34.2 g/dL (ref 30.0–36.0)
MCV: 92.4 fl (ref 78.0–100.0)
Monocytes Absolute: 0.3 10*3/uL (ref 0.1–1.0)
Monocytes Relative: 6.4 % (ref 3.0–12.0)
Neutro Abs: 2.7 10*3/uL (ref 1.4–7.7)
Neutrophils Relative %: 53.5 % (ref 43.0–77.0)
Platelets: 157 10*3/uL (ref 150.0–400.0)
RBC: 4.22 Mil/uL (ref 3.87–5.11)
RDW: 13.6 % (ref 11.5–15.5)
WBC: 5.1 10*3/uL (ref 4.0–10.5)

## 2013-07-19 LAB — BASIC METABOLIC PANEL
BUN: 16 mg/dL (ref 6–23)
CO2: 28 mEq/L (ref 19–32)
Calcium: 9.2 mg/dL (ref 8.4–10.5)
Chloride: 100 mEq/L (ref 96–112)
Creatinine, Ser: 0.9 mg/dL (ref 0.4–1.2)
GFR: 62.08 mL/min (ref >60.00)
Glucose, Bld: 120 mg/dL — ABNORMAL HIGH (ref 70–99)
Potassium: 3.9 mEq/L (ref 3.5–5.1)
Sodium: 137 mEq/L (ref 135–145)

## 2013-07-19 LAB — TSH: TSH: 1.02 u[IU]/mL (ref 0.35–4.50)

## 2013-07-19 LAB — VITAMIN B12: Vitamin B-12: 497 pg/mL (ref 211–911)

## 2013-07-19 LAB — URIC ACID: Uric Acid, Serum: 6.7 mg/dL (ref 2.4–7.0)

## 2013-07-19 LAB — MICROALBUMIN / CREATININE URINE RATIO
Creatinine,U: 38.6 mg/dL
Microalb Creat Ratio: 3.6 mg/g (ref 0.0–30.0)
Microalb, Ur: 1.4 mg/dL (ref 0.0–1.9)

## 2013-07-19 LAB — T4, FREE: Free T4: 0.9 ng/dL (ref 0.60–1.60)

## 2013-07-19 LAB — HEMOGLOBIN A1C: Hgb A1c MFr Bld: 6.9 % — ABNORMAL HIGH (ref 4.6–6.5)

## 2013-07-19 MED ORDER — ONETOUCH ULTRA SYSTEM W/DEVICE KIT: 1.0000 | PACK | Freq: Once | Status: DC

## 2013-07-19 NOTE — Assessment & Plan Note (Signed)
Stable on omeprazole 40mg  daily, with extra 2nd if needed.

## 2013-07-19 NOTE — Assessment & Plan Note (Signed)
Presumed osteo but evidence of chronic bursitis and some dactylitis today - nontender. Check uric acid level today.

## 2013-07-19 NOTE — Assessment & Plan Note (Signed)
Recheck today with endorsed fatigue.

## 2013-07-19 NOTE — Assessment & Plan Note (Signed)
Recheck A1c and microalb.

## 2013-07-19 NOTE — Progress Notes (Signed)
BP 148/80  Pulse 66  Temp(Src) 98.4 F (36.9 C) (Tympanic)  Wt 183 lb (83.008 kg)  SpO2 98%   CC: 6 mo f/u   Subjective:    Patient ID: Jordan Patterson, female    DOB: 05/09/1940, 73 y.o.   MRN: [id number]  HPI: Jordan Patterson is a 73 y.o. female presenting on 07/19/2013 for Follow-up   DM - diet controlled. Last A1c 6.8%. Due for recheck. No paresthesias, no hypoglycemic sxs. Eye exam 03/2013. Pneumovax 2007. Foot exam today.  Fatigue worse over last several weeks - stays tired. Worse in mid afternoon. This is unusual for her. Works 3d/wk. Has been going to sleep 9pm to get on average 8-9 hours. Restorative sleep. No snoring or PNDyspnea. Weight gain noted as well (10lbs in last year). Would like thyroid checked.   Hypothyroidism - takes brand synthroid 88mcg daily 30 min prior to breakfast.  Lab Results  Component Value Date   VITAMINB12 373 06/14/2012  last Vit D 77 (12/2011)  Wt Readings from Last 3 Encounters:  07/19/13 183 lb (83.008 kg)  03/19/13 185 lb 4 oz (84.029 kg)  01/22/13 183 lb (83.008 kg)   Body mass index is 27.83 kg/(m^2).  Relevant past medical, surgical, family and social history reviewed and updated as indicated.  Allergies and medications reviewed and updated. Current Outpatient Prescriptions on File Prior to Visit  Medication Sig  . albuterol (PROAIR HFA) 108 (90 BASE) MCG/ACT inhaler Inhale 2 puffs into the lungs every 6 (six) hours as needed for wheezing or shortness of breath.  . Cinnamon 500 MG TABS Take 1 tablet by mouth 2 (two) times daily.   . Coenzyme Q10 (COQ10) 100 MG CAPS Take 1 capsule by mouth daily.  Marland Kitchen. glucose blood (FREESTYLE LITE) test strip Use to check blood sugar 2-3 times daily as directed.  Dx:  250.00  . levocetirizine (XYZAL) 5 MG tablet Take 5 mg by mouth every evening.  . montelukast (SINGULAIR) 10 MG tablet Take 1 tablet (10 mg total) by mouth at bedtime.  . Multiple Vitamins-Minerals (MULTIVITAMIN PO) Take 1 tablet by mouth  daily.  Marland Kitchen. omeprazole (PRILOSEC) 40 MG capsule Take 1 capsule (40 mg total) by mouth 2 (two) times daily.  . Probiotic Product (ALIGN) 4 MG CAPS Take 1 capsule by mouth daily.  Marland Kitchen. SYNTHROID 88 MCG tablet TAKE 1 TABLET BY MOUTH DAILY  . triamterene-hydrochlorothiazide (DYAZIDE) 37.5-25 MG per capsule TAKE ONE CAPSULE BY MOUTH EVERY MORNING  . Valerian Root 500 MG CAPS Take 1 capsule by mouth 2 (two) times daily.   No current facility-administered medications on file prior to visit.    Review of Systems Per HPI unless specifically indicated above    Objective:    BP 148/80  Pulse 66  Temp(Src) 98.4 F (36.9 C) (Tympanic)  Wt 183 lb (83.008 kg)  SpO2 98%  Physical Exam  Nursing note and vitals reviewed. Constitutional: She appears well-developed and well-nourished. No distress.  HENT:  Mouth/Throat: Oropharynx is clear and moist. No oropharyngeal exudate.  Eyes: Conjunctivae and EOM are normal. Pupils are equal, round, and reactive to light. No scleral icterus.  Neck: Normal range of motion. Neck supple. No thyromegaly present.  Cardiovascular: Normal rate, regular rhythm, normal heart sounds and intact distal pulses.   No murmur heard. Pulmonary/Chest: Effort normal and breath sounds normal. No respiratory distress. She has no wheezes. She has no rales.  Musculoskeletal: She exhibits no edema.  Diabetic foot exam: Normal inspection No  skin breakdown Early calluses on tips of digits Normal DP pulses Normal sensation to light touch and monofilament Nails normal  L 2nd toe swollen nontender, no erythema or warmth. R suprapatellar bursal swelling without pain or erythema or warmth  Lymphadenopathy:    She has no cervical adenopathy.       Assessment & Plan:   Problem List Items Addressed This Visit   Osteopenia     Await results of dexa done yesterday.    Hypothyroidism     Recheck today with endorsed fatigue.    Relevant Orders      TSH      T4, Free   Hypertension      Chronic, stable. Continue meds.    GERD (gastroesophageal reflux disease)     Stable on omeprazole 40mg  daily, with extra 2nd if needed.    Fatigue - Primary     Check for reversible causes of fatigue.    Relevant Orders      Vitamin B12      CBC with Differential      Basic metabolic panel      TSH      T4, Free   Diabetes type 2, controlled     Recheck A1c and microalb.    Relevant Orders      Hemoglobin A1c      Microalbumin / creatinine urine ratio      HM DIABETES FOOT EXAM (Completed)   Arthritis     Presumed osteo but evidence of chronic bursitis and some dactylitis today - nontender. Check uric acid level today.    Relevant Orders      Uric acid       Follow up plan: Return in about 6 months (around 01/19/2014), or if symptoms worsen or fail to improve, for annual exam, prior fasting for blood work.

## 2013-07-19 NOTE — Progress Notes (Signed)
Pre visit review using our clinic review tool, if applicable. No additional management support is needed unless otherwise documented below in the visit note. 

## 2013-07-19 NOTE — Patient Instructions (Addendum)
Blood work today. Glucose meter sent to pharmacy We will call you with results. Return in 6 months for wellness exam.

## 2013-07-19 NOTE — Assessment & Plan Note (Signed)
Chronic, stable. Continue meds. 

## 2013-07-19 NOTE — Assessment & Plan Note (Signed)
Await results of dexa done yesterday.

## 2013-07-19 NOTE — Assessment & Plan Note (Signed)
Check for reversible causes of fatigue.  

## 2013-07-22 ENCOUNTER — Encounter: Payer: Self-pay | Admitting: Family Medicine

## 2013-07-26 ENCOUNTER — Encounter: Payer: Self-pay | Admitting: Family Medicine

## 2013-07-27 ENCOUNTER — Other Ambulatory Visit: Payer: Self-pay | Admitting: *Deleted

## 2013-07-27 MED ORDER — GLUCOSE BLOOD VI STRP: ORAL_STRIP | Status: DC

## 2013-08-18 ENCOUNTER — Encounter: Payer: Self-pay | Admitting: Family Medicine

## 2013-10-15 ENCOUNTER — Encounter: Payer: Self-pay | Admitting: Pulmonary Disease

## 2013-10-15 ENCOUNTER — Ambulatory Visit (INDEPENDENT_AMBULATORY_CARE_PROVIDER_SITE_OTHER): Payer: Medicare Other | Admitting: Pulmonary Disease

## 2013-10-15 VITALS — BP 112/78 | HR 77 | Ht 68.5 in | Wt 183.0 lb

## 2013-10-15 DIAGNOSIS — J309 Allergic rhinitis, unspecified: Secondary | ICD-10-CM

## 2013-10-15 DIAGNOSIS — R05 Cough: Secondary | ICD-10-CM

## 2013-10-15 DIAGNOSIS — H1013 Acute atopic conjunctivitis, bilateral: Secondary | ICD-10-CM

## 2013-10-15 DIAGNOSIS — H1010 Acute atopic conjunctivitis, unspecified eye: Secondary | ICD-10-CM

## 2013-10-15 DIAGNOSIS — R059 Cough, unspecified: Secondary | ICD-10-CM

## 2013-10-15 DIAGNOSIS — J449 Chronic obstructive pulmonary disease, unspecified: Secondary | ICD-10-CM

## 2013-10-15 MED ORDER — ALBUTEROL SULFATE HFA 108 (90 BASE) MCG/ACT IN AERS: 2.0000 | INHALATION_SPRAY | Freq: Four times a day (QID) | RESPIRATORY_TRACT | Status: DC | PRN

## 2013-10-15 MED ORDER — EPINEPHRINE 0.3 MG/0.3ML IJ SOAJ
0.3000 mg | Freq: Once | INTRAMUSCULAR | Status: DC
Start: 2013-10-15 — End: 2015-02-27

## 2013-10-15 NOTE — Assessment & Plan Note (Signed)
She has improved since starting allergy shots.  She would like to consolidate her care within [facility].  Will arrange for her to have follow up with Dr. Jetty Duhamel.  Explained that he could then assume management of her asthma with COPD.

## 2013-10-15 NOTE — Patient Instructions (Addendum)
Will arrange for referral to Dr. Jetty Duhamel for allergy assessment and pulmonary follow up Will change proair to ventolin

## 2013-10-15 NOTE — Progress Notes (Signed)
Chief Complaint  Patient presents with  . Follow-up    Pt denies any breathing issues since last OV. Pt reports starting allergy vaccines after last OV--interested in switching to our allergy lab.     History of Present Illness: Jordan Patterson is a 73 y.o. female with chronic cough related to allergic rhinitis, post-nasal drip, GERD and asthma.  She has been doing better.  She has been on allergy shots since her last visit and these have helped.  She also had her omeprazole increased to BID by GI >> this helped, and now she only takes a second dose when she feels like she is having more trouble.  She has noticed a metallic taste in her mouth for past few weeks >> she was told by her pharmacist that this could be related to proair.  She has been followed by Dr. [name] for her allergies >> she thought his practice was affiliated with [facility].  She wants to switch to have all her care through [facility].   Tests: Allergy test 12/08/07 >> Perennial rye 2+, Black walnut pollen 3+, Tree mix 4+, Dust mite 4+, Cat/Dog 4+ PFT 06/01/12 >> FEV1 1.88 (80%), FEV1% 66, TLC 5.63 (100%), RV 134%, DLCO 78%, +BD   PMHx, PSHx, Medications, Allergies, Fhx, Shx reviewed.  Physical Exam:  General - No distress ENT - No sinus tenderness, no oral exudate, no LAN Cardiac - s1s2 regular, no murmur Chest - No wheeze/rales/dullness Back - No focal tenderness Abd - Soft, non-tender Ext - No edema Neuro - Normal strength Skin - No rashes Psych - Normal mood, and behavior  Assessment/Plan:  Coralyn Helling, MD [place] [facility] Pager:  [phone] 10/15/2013, 5:03 PM

## 2013-10-15 NOTE — Assessment & Plan Note (Signed)
Related to asthma/COPD, allergies, and GERD.  Improved compared to last visit.

## 2013-10-15 NOTE — Assessment & Plan Note (Signed)
Stable.  She is to continue singulair.  She is reluctant to use inhaled steroids.  Will change her from proair to ventolin to see if this improves her sensation of metallic taste.

## 2013-10-31 ENCOUNTER — Other Ambulatory Visit: Payer: Self-pay | Admitting: Pulmonary Disease

## 2013-10-31 MED ORDER — MONTELUKAST SODIUM 10 MG PO TABS: 10.0000 mg | ORAL_TABLET | Freq: Every day | ORAL | Status: DC

## 2013-11-05 ENCOUNTER — Encounter: Payer: Self-pay | Admitting: Family Medicine

## 2013-11-08 ENCOUNTER — Ambulatory Visit: Payer: Medicare Other | Admitting: Family Medicine

## 2013-11-12 ENCOUNTER — Ambulatory Visit (INDEPENDENT_AMBULATORY_CARE_PROVIDER_SITE_OTHER): Payer: Medicare Other | Admitting: Family Medicine

## 2013-11-12 ENCOUNTER — Encounter: Payer: Self-pay | Admitting: Family Medicine

## 2013-11-12 VITALS — BP 118/76 | HR 74 | Temp 97.8°F | Wt 182.0 lb

## 2013-11-12 DIAGNOSIS — R439 Unspecified disturbances of smell and taste: Secondary | ICD-10-CM

## 2013-11-12 DIAGNOSIS — R432 Parageusia: Secondary | ICD-10-CM | POA: Insufficient documentation

## 2013-11-12 NOTE — Patient Instructions (Addendum)
I'd suggest stopping similasan AG. Blood work today. Trial off align.

## 2013-11-12 NOTE — Progress Notes (Signed)
BP 118/76  Pulse 74  Temp(Src) 97.8 F (36.6 C) (Oral)  Wt 182 lb (82.555 kg)  SpO2 96%   CC: dysgeusia Subjective:    Patient ID: Jordan Patterson, female    DOB: 26-Nov-1940, 73 y.o.   MRN: [id number]  HPI: Jordan Patterson is a 73 y.o. female presenting on 11/12/2013 for bad taste in mouth   "Terrible taste in mouth" for last 3 months. Describes constant metallic taste without improvement. Has discussed with dentist, with pulm, and with myself without resolution over last few months. Over mychart we discussed this and she has tried increasing omeprazole to  bid for several weeks - this didn't help bad taste. GERD overall stable. No sinus congestion/pressure/infection sxs. Continues xyzal, singulair, allergy shots and recently restarted nasacort INS.  Recently changed from restasis to similasan AG homeopathic eye drops (over last 6+ months) for chronic dry eyes. Has upcoming appt with ophtho.  Wonders about loss of smell (ongoing issue) - no tremors or imbalance or memory trouble, ?thyroid med related (has been on brand synthroid for years), ?zinc deficiency, ?sjogren' syndrome related.  No trouble swallowing, no h/o thrush.   Relevant past medical, surgical, family and social history reviewed and updated as indicated.  Allergies and medications reviewed and updated. Current Outpatient Prescriptions on File Prior to Visit  Medication Sig  . albuterol (VENTOLIN HFA) 108 (90 BASE) MCG/ACT inhaler Inhale 2 puffs into the lungs every 6 (six) hours as needed for wheezing or shortness of breath.  . Cinnamon 500 MG TABS Take 1 tablet by mouth 2 (two) times daily.   . Coenzyme Q10 (COQ10) 100 MG CAPS Take 1 capsule by mouth daily.  Marland Kitchen EPINEPHrine 0.3 mg/0.3 mL IJ SOAJ injection Inject 0.3 mLs (0.3 mg total) into the muscle once.  Marland Kitchen glucose blood test strip ONE TOUCH ULTRA MINI Use as instructed to check sugar 2-3 daily. Dx: 250.00  . levocetirizine (XYZAL) 5 MG tablet Take 5 mg by mouth every  evening.  . montelukast (SINGULAIR) 10 MG tablet Take 1 tablet (10 mg total) by mouth at bedtime.  . Multiple Vitamins-Minerals (MULTIVITAMIN PO) Take 1 tablet by mouth daily.  . Multiple Vitamins-Minerals (VISION VITAMINS) TABS Take by mouth daily.  Marland Kitchen omeprazole (PRILOSEC) 40 MG capsule Take 1 capsule (40 mg total) by mouth 2 (two) times daily.  . Probiotic Product (ALIGN) 4 MG CAPS Take 1 capsule by mouth daily.  Marland Kitchen SYNTHROID 88 MCG tablet TAKE 1 TABLET BY MOUTH DAILY  . triamcinolone (NASACORT ALLERGY 24HR) 55 MCG/ACT AERO nasal inhaler Place 2 sprays into the nose as needed.   . triamterene-hydrochlorothiazide (DYAZIDE) 37.5-25 MG per capsule TAKE ONE CAPSULE BY MOUTH EVERY MORNING  . Valerian Root 500 MG CAPS Take 1 capsule by mouth 2 (two) times daily.   No current facility-administered medications on file prior to visit.    Review of Systems Per HPI unless specifically indicated above    Objective:    BP 118/76  Pulse 74  Temp(Src) 97.8 F (36.6 C) (Oral)  Wt 182 lb (82.555 kg)  SpO2 96%  Physical Exam  Nursing note and vitals reviewed. Constitutional: She appears well-developed and well-nourished. No distress.  HENT:  Head: Normocephalic and atraumatic.  Nose: Mucosal edema (pallor present) present. No rhinorrhea. Right sinus exhibits no maxillary sinus tenderness and no frontal sinus tenderness. Left sinus exhibits no maxillary sinus tenderness and no frontal sinus tenderness.  Mouth/Throat: Uvula is midline, oropharynx is clear and moist and mucous membranes  are normal. No oropharyngeal exudate, posterior oropharyngeal edema, posterior oropharyngeal erythema or tonsillar abscesses.  Somewhat erythematous oropharynx and smooth tongue  Eyes: Conjunctivae and EOM are normal. Pupils are equal, round, and reactive to light. No scleral icterus.  Neck: Normal range of motion. Neck supple. No thyromegaly present.  No salivary gland enlargement  Lymphadenopathy:    She has no  cervical adenopathy.  Skin: Skin is warm and dry. No rash noted.   Lab Results  Component Value Date   VITAMINB12 497 07/19/2013   Lab Results  Component Value Date   TSH 1.02 07/19/2013       Assessment & Plan:   Problem List Items Addressed This Visit   Dysgeusia - Primary     In h/o GERD and severe allergic rhinitis, however currently under appropriate treatment for AR (xyzal, singulair, INS and allergy shots) and failed trial of omeprazole  bid for several weeks Check zinc and anti-SSA/SSB today. Consider referral to ENT if labwork unrevealing. If no results found, consider klonopin treatment?    Relevant Orders      Zinc      Sjogren's syndrome antibods(ssa + ssb)       Follow up plan: Return if symptoms worsen or fail to improve.

## 2013-11-12 NOTE — Assessment & Plan Note (Signed)
In h/o GERD and severe allergic rhinitis, however currently under appropriate treatment for AR (xyzal, singulair, INS and allergy shots) and failed trial of omeprazole  bid for several weeks Check zinc and anti-SSA/SSB today. Consider referral to ENT if labwork unrevealing. If no results found, consider klonopin treatment?

## 2013-11-12 NOTE — Progress Notes (Signed)
Pre visit review using our clinic review tool, if applicable. No additional management support is needed unless otherwise documented below in the visit note. 

## 2013-11-13 LAB — SJOGREN'S SYNDROME ANTIBODS(SSA + SSB)
SSA (Ro) (ENA) Antibody, IgG: <1
SSB (La) (ENA) Antibody, IgG: <1

## 2013-11-15 ENCOUNTER — Other Ambulatory Visit: Payer: Self-pay | Admitting: Family Medicine

## 2013-11-16 LAB — ZINC: Zinc: 63 ug/dL (ref 60–130)

## 2013-11-20 ENCOUNTER — Encounter: Payer: Self-pay | Admitting: Internal Medicine

## 2013-11-21 ENCOUNTER — Encounter: Payer: Self-pay | Admitting: Family Medicine

## 2013-11-29 ENCOUNTER — Ambulatory Visit (INDEPENDENT_AMBULATORY_CARE_PROVIDER_SITE_OTHER): Payer: Medicare Other | Admitting: Internal Medicine

## 2013-11-29 ENCOUNTER — Encounter: Payer: Self-pay | Admitting: Internal Medicine

## 2013-11-29 VITALS — BP 130/82 | HR 78 | Ht 68.5 in | Wt 185.2 lb

## 2013-11-29 DIAGNOSIS — J309 Allergic rhinitis, unspecified: Principal | ICD-10-CM

## 2013-11-29 DIAGNOSIS — H1013 Acute atopic conjunctivitis, bilateral: Secondary | ICD-10-CM

## 2013-11-29 DIAGNOSIS — J449 Chronic obstructive pulmonary disease, unspecified: Secondary | ICD-10-CM

## 2013-11-29 NOTE — Patient Instructions (Signed)
Please bring from Dr Lyla SonSharma's office your current vaccine vials and the shot records showing what is in your vials and what they have been giving you.  Ask your primary physician what to do about pneumonia vaccine. We want to keep records inthis system correct as to what vaccinations you have had.   Please call as needed

## 2013-11-29 NOTE — Progress Notes (Signed)
11/29/13- 89 yoF never smoker followed here by Dr Craige CottaSood FOLLOW FOR:  Allergy consult; has been seeing Dr. [place] CallasSharma, but wanted to find someone closer to her home. Referred to Dr. Maple HudsonYoung by Dr. Craige CottaSood History of allergic rhinitis/hay fever which was originally worse in the fall but is now perennial. Allergy skin testing 09/19/2012 by Dr. Collene MaresSharma-positive for dust mite, cockroach, tree pollens, grass pollens, cat She began allergy vaccine in August of 2014 and feels that has helped, with no adverse reactions. She anticipates continuing allergy vaccine therapy. She has not had adequate control with Nasacort, Xyzal,  Her asthma is managed by Dr. Craige CottaSood with albuterol , Singulair.  She denies history of adverse reactions to insect stings or specific foods and does not have a significant history of cutaneous allergy. GERD is managed with omeprazole twice daily but she still is aware of reflux events.  Prior to Admission medications   Medication Sig Start Date End Date Taking? Authorizing Provider  albuterol (VENTOLIN HFA) 108 (90 BASE) MCG/ACT inhaler Inhale 2 puffs into the lungs every 6 (six) hours as needed for wheezing or shortness of breath. 10/15/13  Yes Coralyn HellingVineet Sood, MD  Coenzyme Q10 (COQ10) 100 MG CAPS Take 1 capsule by mouth daily.   Yes Historical Provider, MD  EPINEPHrine 0.3 mg/0.3 mL IJ SOAJ injection Inject 0.3 mLs (0.3 mg total) into the muscle once. 10/15/13  Yes Coralyn HellingVineet Sood, MD  Garlic (GARLIQUE PO) Take 1 tablet by mouth daily.   Yes Historical Provider, MD  glucose blood test strip ONE TOUCH ULTRA MINI Use as instructed to check sugar 2-3 daily. Dx: 250.00 07/27/13  Yes Eustaquio BoydenJavier Gutierrez, MD  levocetirizine (XYZAL) 5 MG tablet Take 5 mg by mouth every evening.   Yes Historical Provider, MD  Magnesium 250 MG TABS Take 250 mg by mouth daily.   Yes Historical Provider, MD  Melatonin 5 MG TABS Take 5 mg by mouth daily.   Yes Historical Provider, MD  Misc Natural Products (OSTEO BI-FLEX ADV DOUBLE ST PO)  Take by mouth 2 (two) times daily.   Yes Historical Provider, MD  montelukast (SINGULAIR) 10 MG tablet Take 1 tablet (10 mg total) by mouth at bedtime. 10/31/13  Yes Coralyn HellingVineet Sood, MD  Multiple Vitamins-Minerals (MULTIVITAMIN PO) Take 1 tablet by mouth daily.   Yes Historical Provider, MD  Multiple Vitamins-Minerals (VISION VITAMINS) TABS Take by mouth daily.   Yes Historical Provider, MD  omeprazole (PRILOSEC) 40 MG capsule Take 1 capsule (40 mg total) by mouth 2 (two) times daily. 01/10/13  Yes Hart Carwinora M Brodie, MD  Polyvinyl Alcohol-Povidone (REFRESH OP) Apply to eye daily.   Yes Historical Provider, MD  Probiotic Product (ALIGN) 4 MG CAPS Take 1 capsule by mouth daily.   Yes Historical Provider, MD  SYNTHROID 88 MCG tablet TAKE 1 TABLET BY MOUTH DAILY   Yes Eustaquio BoydenJavier Gutierrez, MD  triamcinolone (NASACORT ALLERGY 24HR) 55 MCG/ACT AERO nasal inhaler Place 2 sprays into the nose as needed.    Yes Historical Provider, MD  triamterene-hydrochlorothiazide (DYAZIDE) 37.5-25 MG per capsule TAKE ONE CAPSULE BY MOUTH EACH MORNING 11/15/13  Yes Eustaquio BoydenJavier Gutierrez, MD   Past Medical History  Diagnosis Date  . Hiatal hernia   . GERD (gastroesophageal reflux disease)     [name]([place]) EGD - mild esophageal dysmotility and small hiatal hernia  . Hypertension   . Extrinsic asthma   . Hyperlipidemia     mild, diet controlled  . Depression   . Hypothyroidism   . Osteopenia   .  Diabetes type 2, controlled 2004    diet controlled  . Macular degeneration   . Arthritis     hands and knees - ?osteo  . Environmental allergies     dust,mold,mildew  . Hx of migraines   . Chronic headaches   . Allergic rhinitis    Past Surgical History  Procedure Laterality Date  . Dilation and curettage of uterus  1978  . Tonsillectomy and adenoidectomy  1962  . Laparoscopy abdomen diagnostic      To R/O endometriosis  . Esophagogastroduodenoscopy  06/25/2011    Procedure: ESOPHAGOGASTRODUODENOSCOPY (EGD);  Surgeon: Hart Carwin,  MD;  Location: [facility];  Service: Endoscopy;  Laterality: N/A;  no Xray  . Savory dilation  06/25/2011    Procedure: SAVORY DILATION;  Surgeon: Hart Carwin, MD;  Location: [facility];  Service: Endoscopy;  Laterality: N/A;  . Cataract extraction  2003    bilateral  . Dexa  05/2008    improved (initial DEXA 2007 mild osteopenia)  . Colonoscopy     Family History  Problem Relation Age of Onset  . Mitral valve prolapse Father   . Mitral valve prolapse Mother   . Hypertension Mother   . Diabetes Father   . Stroke Mother 23    after valve surgery  . Stroke Paternal Grandfather   . Coronary artery disease Maternal Grandfather   . Cancer Neg Hx    History   Social History  . Marital Status: Widowed    Spouse Name: N/A    Number of Children: 3  . Years of Education: N/A   Occupational History  . Retired    Social History Main Topics  . Smoking status: Never Smoker   . Smokeless tobacco: Never Used  . Alcohol Use: No  . Drug Use: No  . Sexual Activity: Not on file   Other Topics Concern  . 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 HEENT:   No-  headaches, difficulty swallowing, tooth/dental problems, sore throat,       +sneezing, itching, ear ache, nasal congestion, post nasal drip,  CV:  No-   chest pain, orthopnea, PND, swelling in lower extremities, anasarca,                                  dizziness, palpitations Resp: No-   shortness of breath with exertion or at rest.              No-   productive cough,  No non-productive cough,  No- coughing up of blood.              No-    change in color of mucus.+ wheezing.   Skin: No-   rash or lesions. GI:  +  heartburn, indigestion, No-abdominal pain, nausea, vomiting, diarrhea,                 change in bowel habits, loss of appetite GU: No-   dysuria, change in color of urine,  no urgency or frequency.  No- flank pain. MS:  No-   joint pain or swelling.  No- decreased range of motion.  No- back pain. Neuro-     nothing unusual Psych:  No- change in mood or affect. No depression or anxiety.  No memory loss.  OBJ- Physical Exam General- Alert, Oriented, Affect-appropriate, Distress- none acute Skin- rash-none, lesions- none, excoriation- none Lymphadenopathy- none Head- atraumatic            Eyes- Gross vision intact, PERRLA, conjunctivae and secretions clear            Ears- Hearing, canals-normal            Nose- Clear, no-Septal dev, mucus, polyps, erosion, perforation             Throat- Mallampati II , mucosa clear , drainage+white, tonsils- atrophic Neck- flexible , trachea midline, no stridor , thyroid nl, carotid no bruit Chest - symmetrical excursion , unlabored           Heart/CV- RRR , no murmur , no gallop  , no rub, nl s1 s2                           - JVD- none , edema- none, stasis changes- none, varices- none           Lung- clear to P&A, wheeze- none, cough- none , dullness-none, rub- none           Chest wall-  Abd- tender-no, distended-no, bowel sounds-present, HSM- no Br/ Gen/ Rectal- Not done, not indicated Extrem- cyanosis- none, clubbing, none, atrophy- none, strength- nl Neuro- grossly intact to observation

## 2013-11-29 NOTE — Assessment & Plan Note (Signed)
Allergic component, perennial, to environmental allergens including her cat. She wants to continue allergy vaccine begun one year ago by Dr. [place] CallasSharma but transfer management to our office. Risk benefit considerations discussed with her carefully. Plan-she'll bring her remaining allergy vaccine and current instructions to us. When this vaccine supply is used up, we will mix here to continue. Environmental precautions including management of her cat.

## 2013-11-29 NOTE — Assessment & Plan Note (Addendum)
Managed by Dr. Craige CottaSood

## 2013-12-07 ENCOUNTER — Other Ambulatory Visit: Payer: Self-pay

## 2013-12-11 ENCOUNTER — Ambulatory Visit: Payer: Medicare Other

## 2013-12-11 ENCOUNTER — Telehealth: Payer: Self-pay

## 2013-12-11 NOTE — Telephone Encounter (Signed)
[facility](Allergy med Milas GainVial). Asked Kim CMA and she is not aware of allergy vial. Spoke with Tammy again and she realized she called the wrong office.

## 2013-12-20 ENCOUNTER — Ambulatory Visit (INDEPENDENT_AMBULATORY_CARE_PROVIDER_SITE_OTHER): Payer: Medicare Other

## 2013-12-20 DIAGNOSIS — J309 Allergic rhinitis, unspecified: Secondary | ICD-10-CM

## 2013-12-25 ENCOUNTER — Ambulatory Visit (INDEPENDENT_AMBULATORY_CARE_PROVIDER_SITE_OTHER): Payer: Medicare Other

## 2013-12-25 DIAGNOSIS — J309 Allergic rhinitis, unspecified: Secondary | ICD-10-CM

## 2014-01-01 ENCOUNTER — Ambulatory Visit (INDEPENDENT_AMBULATORY_CARE_PROVIDER_SITE_OTHER): Payer: Medicare Other

## 2014-01-01 DIAGNOSIS — J309 Allergic rhinitis, unspecified: Secondary | ICD-10-CM

## 2014-01-08 ENCOUNTER — Ambulatory Visit (INDEPENDENT_AMBULATORY_CARE_PROVIDER_SITE_OTHER): Payer: Medicare Other

## 2014-01-08 DIAGNOSIS — J309 Allergic rhinitis, unspecified: Secondary | ICD-10-CM

## 2014-01-15 ENCOUNTER — Ambulatory Visit (INDEPENDENT_AMBULATORY_CARE_PROVIDER_SITE_OTHER): Payer: Medicare Other

## 2014-01-15 DIAGNOSIS — J309 Allergic rhinitis, unspecified: Secondary | ICD-10-CM

## 2014-01-19 ENCOUNTER — Other Ambulatory Visit: Payer: Self-pay | Admitting: Family Medicine

## 2014-01-19 DIAGNOSIS — E119 Type 2 diabetes mellitus without complications: Secondary | ICD-10-CM

## 2014-01-19 DIAGNOSIS — I10 Essential (primary) hypertension: Secondary | ICD-10-CM

## 2014-01-19 DIAGNOSIS — E785 Hyperlipidemia, unspecified: Secondary | ICD-10-CM

## 2014-01-21 ENCOUNTER — Other Ambulatory Visit (INDEPENDENT_AMBULATORY_CARE_PROVIDER_SITE_OTHER): Payer: Medicare Other

## 2014-01-21 DIAGNOSIS — E119 Type 2 diabetes mellitus without complications: Secondary | ICD-10-CM

## 2014-01-21 DIAGNOSIS — E785 Hyperlipidemia, unspecified: Secondary | ICD-10-CM

## 2014-01-21 DIAGNOSIS — I10 Essential (primary) hypertension: Secondary | ICD-10-CM

## 2014-01-21 LAB — BASIC METABOLIC PANEL
BUN: 17 mg/dL (ref 6–23)
CO2: 27 mEq/L (ref 19–32)
Calcium: 8.8 mg/dL (ref 8.4–10.5)
Chloride: 99 mEq/L (ref 96–112)
Creatinine, Ser: 0.9 mg/dL (ref 0.4–1.2)
GFR: 66.03 mL/min (ref >60.00)
Glucose, Bld: 143 mg/dL — ABNORMAL HIGH (ref 70–99)
Potassium: 4.2 mEq/L (ref 3.5–5.1)
Sodium: 136 mEq/L (ref 135–145)

## 2014-01-21 LAB — LIPID PANEL
Cholesterol: 284 mg/dL — ABNORMAL HIGH (ref 0–200)
HDL: 42.5 mg/dL (ref >39.00)
LDL Cholesterol: 203 mg/dL — ABNORMAL HIGH (ref 0–99)
NonHDL: 241.5
Total CHOL/HDL Ratio: 7
Triglycerides: 191 mg/dL — ABNORMAL HIGH (ref 0.0–149.0)
VLDL: 38.2 mg/dL (ref 0.0–40.0)

## 2014-01-21 LAB — HEMOGLOBIN A1C: Hgb A1c MFr Bld: 7.2 % — ABNORMAL HIGH (ref 4.6–6.5)

## 2014-01-22 ENCOUNTER — Ambulatory Visit (INDEPENDENT_AMBULATORY_CARE_PROVIDER_SITE_OTHER): Payer: Medicare Other

## 2014-01-22 DIAGNOSIS — J309 Allergic rhinitis, unspecified: Secondary | ICD-10-CM

## 2014-01-24 ENCOUNTER — Ambulatory Visit (INDEPENDENT_AMBULATORY_CARE_PROVIDER_SITE_OTHER): Payer: Medicare Other | Admitting: Family Medicine

## 2014-01-24 ENCOUNTER — Encounter: Payer: Self-pay | Admitting: Family Medicine

## 2014-01-24 VITALS — BP 126/84 | HR 88 | Temp 98.3°F | Ht 68.0 in | Wt 185.0 lb

## 2014-01-24 DIAGNOSIS — E785 Hyperlipidemia, unspecified: Secondary | ICD-10-CM

## 2014-01-24 DIAGNOSIS — Z0000 Encounter for general adult medical examination without abnormal findings: Secondary | ICD-10-CM

## 2014-01-24 DIAGNOSIS — I10 Essential (primary) hypertension: Secondary | ICD-10-CM

## 2014-01-24 DIAGNOSIS — Z1211 Encounter for screening for malignant neoplasm of colon: Secondary | ICD-10-CM

## 2014-01-24 DIAGNOSIS — E119 Type 2 diabetes mellitus without complications: Secondary | ICD-10-CM

## 2014-01-24 DIAGNOSIS — Z7189 Other specified counseling: Secondary | ICD-10-CM

## 2014-01-24 DIAGNOSIS — E039 Hypothyroidism, unspecified: Secondary | ICD-10-CM

## 2014-01-24 DIAGNOSIS — K219 Gastro-esophageal reflux disease without esophagitis: Secondary | ICD-10-CM

## 2014-01-24 DIAGNOSIS — M8580 Other specified disorders of bone density and structure, unspecified site: Secondary | ICD-10-CM

## 2014-01-24 MED ORDER — ATORVASTATIN CALCIUM 40 MG PO TABS: 40.0000 mg | ORAL_TABLET | Freq: Every day | ORAL | Status: DC

## 2014-01-24 NOTE — Assessment & Plan Note (Signed)
Reviewed A1c elevated at 7.2%. Start statin, return in 4 mo for recheck. Discussed avoiding added sugars in diet.

## 2014-01-24 NOTE — Patient Instructions (Addendum)
Pass by lab to pick up stool kit. Sugar and cholesterol were too high. Start atorvastatin 17m nightly Return in 3-4 months for follow up diabetes and cholesterol (prior fasting for labs). Good to see you today, call [place] questions.

## 2014-01-24 NOTE — Progress Notes (Signed)
Pre visit review using our clinic review tool, if applicable. No additional management support is needed unless otherwise documented below in the visit note. 

## 2014-01-24 NOTE — Assessment & Plan Note (Signed)
Pt states she had dexa done 06/2013. Will search for report and bring for our records.

## 2014-01-24 NOTE — Assessment & Plan Note (Signed)
Chronic, stable. Continue maxzide. 

## 2014-01-24 NOTE — Assessment & Plan Note (Signed)
Lab Results  Component Value Date   TSH 1.02 07/19/2013

## 2014-01-24 NOTE — Assessment & Plan Note (Signed)

## 2014-01-24 NOTE — Assessment & Plan Note (Signed)
Preventative protocols reviewed and updated unless pt declined. Discussed healthy diet and lifestyle.  

## 2014-01-24 NOTE — Assessment & Plan Note (Signed)
Reviewed #s with patient- LDL high at 200+. Start atorvastatin 40mg  daily. Did not tolerate crestor previously.

## 2014-01-24 NOTE — Assessment & Plan Note (Signed)
Continue PPI BID 

## 2014-01-24 NOTE — Progress Notes (Signed)
BP 126/84 mmHg  Pulse 88  Temp(Src) 98.3 F (36.8 C) (Oral)  Ht '5\' 8"'  (1.727 m)  Wt 185 lb (83.915 kg)  BMI 28.14 kg/m2   CC: medicare wellness visit  Subjective:    Patient ID: Jordan Patterson, female    DOB: 1940-07-04, 73 y.o.   MRN: [id number]  HPI: Jordan Patterson is a 73 y.o. female presenting on 01/24/2014 for Annual Exam   Dysgeusia - slowly improving - biotene has helped.  Hearing screen borderline. Has seen audiologist in past. Did not need hearing aides at that time. Told not severe hearing loss. Vision exam with eye doctor (03/2013) followed for macular degeneration. Denies depression/anxiety. Sad because lonely after widowed. Denies anhedonia.  No falls in last year.   Preventative: Colonoscopy - per pt around 2007 with Dr. Norwood Levo, per pt normal. Stool kit today. Mammogram - last in chart 2009. Last 11/2012 normal. Declines breast exam today. Requests Q2 yr mammograms Pap - always normal pap smear/pelvic exams. Established with Dr Esaw Grandchild with [facility], normal pelvic exam. Dexa 2010 osteopenia. Taking vit D. Pt states had dexa 07/2013 - will bring [place] records, thinks done at [facility] Flu shot done Pneumovax - 2007, prevnar - declines Tetanus - 2013 Shingles shot - states had shingles. Declines shingles shot. Advanced directives: has living will at home. Daughter is HCPOA. In chart (02/2013) Seat belt and sunscreen use discussed.  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 Interim medical history since our last visit reviewed. Allergies and medications reviewed and updated.  Current Outpatient Prescriptions on File Prior to Visit  Medication Sig  . albuterol (VENTOLIN HFA) 108 (90 BASE)  MCG/ACT inhaler Inhale 2 puffs into the lungs every 6 (six) hours as needed for wheezing or shortness of breath.  . Coenzyme Q10 (COQ10) 100 MG CAPS Take 1 capsule by mouth daily.  Marland Kitchen EPINEPHrine 0.3 mg/0.3 mL IJ SOAJ injection Inject 0.3 mLs (0.3 mg total) into the muscle once.  . Garlic (GARLIQUE PO) Take 1 tablet by mouth daily.  Marland Kitchen glucose blood test strip ONE TOUCH ULTRA MINI Use as instructed to check sugar 2-3 daily. Dx: 250.00  . levocetirizine (XYZAL) 5 MG tablet Take 5 mg by mouth every evening.  . Magnesium 250 MG TABS Take 250 mg by mouth daily.  . Melatonin 5 MG TABS Take 5 mg by mouth daily.  . Misc Natural Products (OSTEO BI-FLEX ADV DOUBLE ST PO) Take by mouth 2 (two) times daily.  . montelukast (SINGULAIR) 10 MG tablet Take 1 tablet (10 mg total) by mouth at bedtime.  . Multiple Vitamins-Minerals (MULTIVITAMIN PO) Take 1 tablet by mouth daily.  . Multiple Vitamins-Minerals (VISION VITAMINS) TABS Take by mouth daily.  Marland Kitchen omeprazole (PRILOSEC) 40 MG capsule Take 1 capsule (40 mg total) by mouth 2 (two) times daily.  . Polyvinyl Alcohol-Povidone (REFRESH OP) Apply to eye daily.  Marland Kitchen SYNTHROID 88 MCG tablet TAKE 1 TABLET BY MOUTH DAILY  . triamcinolone (NASACORT ALLERGY 24HR) 55 MCG/ACT AERO nasal inhaler Place 2 sprays into the nose as needed.   . triamterene-hydrochlorothiazide (DYAZIDE) 37.5-25 MG per capsule TAKE ONE CAPSULE BY MOUTH EACH MORNING   No current facility-administered medications on file prior to visit.    Review  of Systems  Constitutional: Negative for fever, chills, activity change, appetite change, fatigue and unexpected weight change.  HENT: Negative for hearing loss.   Eyes: Negative for visual disturbance.  Respiratory: Negative for cough, chest tightness, shortness of breath and wheezing.   Cardiovascular: Negative for chest pain, palpitations and leg swelling.  Gastrointestinal: Negative for nausea, vomiting, abdominal pain, diarrhea, constipation, blood in  stool and abdominal distention.  Genitourinary: Negative for hematuria and difficulty urinating.  Musculoskeletal: Negative for myalgias, arthralgias and neck pain.  Skin: Negative for rash.  Neurological: Negative for dizziness, seizures, syncope and headaches.  Hematological: Negative for adenopathy. Does not bruise/bleed easily.  Psychiatric/Behavioral: Negative for dysphoric mood. The patient is not nervous/anxious.    Per HPI unless specifically indicated above     Objective:    BP 126/84 mmHg  Pulse 88  Temp(Src) 98.3 F (36.8 C) (Oral)  Ht '5\' 8"'  (1.727 m)  Wt 185 lb (83.915 kg)  BMI 28.14 kg/m2  Wt Readings from Last 3 Encounters:  01/24/14 185 lb (83.915 kg)  11/29/13 185 lb 3.2 oz (84.006 kg)  11/12/13 182 lb (82.555 kg)    Physical Exam  Constitutional: She is oriented to person, place, and time. She appears well-developed and well-nourished. No distress.  HENT:  Head: Normocephalic and atraumatic.  Right Ear: Hearing, tympanic membrane, external ear and ear canal normal.  Left Ear: Hearing, tympanic membrane, external ear and ear canal normal.  Nose: Nose normal.  Mouth/Throat: Uvula is midline, oropharynx is clear and moist and mucous membranes are normal. No oropharyngeal exudate, posterior oropharyngeal edema or posterior oropharyngeal erythema.  Eyes: Conjunctivae and EOM are normal. Pupils are equal, round, and reactive to light. No scleral icterus.  Neck: Normal range of motion. Neck supple. Carotid bruit is not present. No thyromegaly present.  Cardiovascular: Normal rate, regular rhythm, normal heart sounds and intact distal pulses.   No murmur heard. Pulses:      Radial pulses are 2+ on the right side, and 2+ on the left side.  Pulmonary/Chest: Effort normal and breath sounds normal. No respiratory distress. She has no wheezes. She has no rales.  Breast - per GYN  Abdominal: Soft. Bowel sounds are normal. She exhibits no distension and no mass. There is no  tenderness. There is no rebound and no guarding.  Genitourinary:  GYN - per OBGYN  Musculoskeletal: Normal range of motion. She exhibits no edema.  Lymphadenopathy:    She has no cervical adenopathy.  Neurological: She is alert and oriented to person, place, and time.  CN grossly intact, station and gait intact Recall 3/3  Calculation 5/ 5serial 3s  Skin: Skin is warm and dry. No rash noted.  Psychiatric: She has a normal mood and affect. Her behavior is normal. Judgment and thought content normal.  Nursing note and vitals reviewed.  Results for orders placed or performed in visit on 01/21/14  Lipid panel  Result Value Ref Range   Cholesterol 284 (H) 0 - 200 mg/dL   Triglycerides 191.0 (H) 0.0 - 149.0 mg/dL   HDL 42.50 >39.00 mg/dL   VLDL 38.2 0.0 - 40.0 mg/dL   LDL Cholesterol 203 (H) 0 - 99 mg/dL   Total CHOL/HDL Ratio 7    NonHDL 241.50   Hemoglobin A1c  Result Value Ref Range   Hgb A1c MFr Bld 7.2 (H) 4.6 - 6.5 %  Basic metabolic panel  Result Value Ref Range   Sodium 136 135 - 145 mEq/L   Potassium 4.2  3.5 - 5.1 mEq/L   Chloride 99 96 - 112 mEq/L   CO2 27 19 - 32 mEq/L   Glucose, Bld 143 (H) 70 - 99 mg/dL   BUN 17 6 - 23 mg/dL   Creatinine, Ser 0.9 0.4 - 1.2 mg/dL   Calcium 8.8 8.4 - 10.5 mg/dL   GFR 66.03 >60.00 mL/min      Assessment & Plan:   Problem List Items Addressed This Visit    Osteopenia    Pt states she had dexa done 06/2013. Will search for report and bring for our records.    Medicare annual wellness visit, subsequent - Primary    I have personally reviewed the Medicare Annual Wellness questionnaire and have noted 1. The patient's medical and social history 2. Their use of alcohol, tobacco or illicit drugs 3. Their current medications and supplements 4. The patient's functional ability including ADL's, fall risks, home safety risks and hearing or visual impairment. 5. Diet and physical activity 6. Evidence for depression or mood disorders The  patients weight, height, BMI have been recorded in the chart.  Hearing and vision has been addressed. I have made referrals, counseling and provided education to the patient based review of the above and I have provided the pt with a written personalized care plan for preventive services. Provider list updated - see scanned questionairre.  Reviewed preventative protocols and updated unless pt declined.    Hypothyroidism    Lab Results  Component Value Date   TSH 1.02 07/19/2013      Hypertension    Chronic, stable. Continue maxzide.    Relevant Medications      atorvastatin (LIPITOR) tablet   Hyperlipidemia    Reviewed #s with patient- LDL high at 200+. Start atorvastatin 37m daily. Did not tolerate crestor previously.    Relevant Medications      atorvastatin (LIPITOR) tablet   Health maintenance examination    Preventative protocols reviewed and updated unless pt declined. Discussed healthy diet and lifestyle.     GERD (gastroesophageal reflux disease)    Continue PPI BID.    Diabetes type 2, controlled    Reviewed A1c elevated at 7.2%. Start statin, return in 4 mo for recheck. Discussed avoiding added sugars in diet.    Relevant Medications      atorvastatin (LIPITOR) tablet   Advanced care planning/counseling discussion    Advanced directives: has living will at home. Daughter is HCPOA. In chart (02/2013)     Other Visit Diagnoses    Special screening for malignant neoplasms, colon        Relevant Orders       Fecal occult blood, imunochemical        Follow up plan: Return in about 4 months (around 05/26/2014), or as needed, for follow up visit.

## 2014-01-24 NOTE — Assessment & Plan Note (Signed)
Advanced directives: has living will at home. Daughter is HCPOA. In chart (02/2013)

## 2014-01-25 ENCOUNTER — Telehealth: Payer: Self-pay | Admitting: Family Medicine

## 2014-01-25 NOTE — Telephone Encounter (Signed)
emmi emailed °

## 2014-01-28 ENCOUNTER — Encounter: Payer: Self-pay | Admitting: Family Medicine

## 2014-01-28 DIAGNOSIS — E785 Hyperlipidemia, unspecified: Secondary | ICD-10-CM

## 2014-01-28 NOTE — Telephone Encounter (Signed)
Please see Mychart message from patient.  

## 2014-01-29 ENCOUNTER — Ambulatory Visit (INDEPENDENT_AMBULATORY_CARE_PROVIDER_SITE_OTHER): Payer: Medicare Other

## 2014-01-29 DIAGNOSIS — J309 Allergic rhinitis, unspecified: Secondary | ICD-10-CM

## 2014-02-05 ENCOUNTER — Ambulatory Visit (INDEPENDENT_AMBULATORY_CARE_PROVIDER_SITE_OTHER): Payer: Medicare Other

## 2014-02-05 ENCOUNTER — Encounter: Payer: Self-pay | Admitting: Internal Medicine

## 2014-02-05 ENCOUNTER — Ambulatory Visit (INDEPENDENT_AMBULATORY_CARE_PROVIDER_SITE_OTHER): Payer: Medicare Other | Admitting: Internal Medicine

## 2014-02-05 VITALS — BP 154/90 | HR 88 | Ht 68.5 in | Wt 185.0 lb

## 2014-02-05 DIAGNOSIS — H1013 Acute atopic conjunctivitis, bilateral: Secondary | ICD-10-CM

## 2014-02-05 DIAGNOSIS — J309 Allergic rhinitis, unspecified: Secondary | ICD-10-CM

## 2014-02-05 DIAGNOSIS — J4520 Mild intermittent asthma, uncomplicated: Secondary | ICD-10-CM

## 2014-02-05 NOTE — Progress Notes (Signed)
11/29/13- [age] yoF never smoker followed here by Dr Craige CottaSood FOLLOW FOR:  Allergy consult; has been seeing Dr. [place] CallasSharma, but wanted to find someone closer to her home. Referred to Dr. Maple HudsonYoung by Dr. Craige CottaSood History of allergic rhinitis/hay fever which was originally worse in the fall but is now perennial. Allergy skin testing 09/19/2012 by Dr. Collene MaresSharma-positive for dust mite, cockroach, tree pollens, grass pollens, cat She began allergy vaccine in August of 2014 and feels that has helped, with no adverse reactions. She anticipates continuing allergy vaccine therapy. She has not had adequate control with Nasacort, Xyzal,  Her asthma is managed by Dr. Craige CottaSood with albuterol , Singulair.  She denies history of adverse reactions to insect stings or specific foods and does not have a significant history of cutaneous allergy. GERD is managed with omeprazole twice daily but she still is aware of reflux events.  02/05/14-73 yoF never smoker followed here by Dr Craige CottaSood FOLLOWS FOR: Pt states she is doing well overall with allergies. Continues allergy vaccine. Denies any wheeze. Has a rescue inhaler, not needed. Using Nasacort. Feeling some pressure right ear without loss of hearing.  ROS-see HPI Constitutional:   No-   weight loss, night sweats, fevers, chills, fatigue, lassitude. HEENT:   No-  headaches, difficulty swallowing, tooth/dental problems, sore throat,       +sneezing, itching, ear ache, nasal congestion, post nasal drip,  CV:  No-   chest pain, orthopnea, PND, swelling in lower extremities, anasarca,                                  dizziness, palpitations Resp: No-   shortness of breath with exertion or at rest.              No-   productive cough,  No non-productive cough,  No- coughing up of blood.              No-   change in color of mucus.+ wheezing.   Skin: No-   rash or lesions. GI:  +  heartburn, indigestion, No-abdominal pain, nausea, vomiting, GU:  MS:  No-   joint pain or swelling.   Neuro-      nothing unusual Psych:  No- change in mood or affect. No depression or anxiety.  No memory loss.  OBJ- Physical Exam General- Alert, Oriented, Affect-appropriate, Distress- none acute Skin- rash-none, lesions- none, excoriation- none Lymphadenopathy- none Head- atraumatic            Eyes- Gross vision intact, PERRLA, conjunctivae and secretions clear            Ears- + cerumen right ear            Nose- Clear, no-Septal dev, mucus, polyps, erosion, perforation             Throat- Mallampati II , mucosa clear , drainage+white, tonsils- atrophic Neck- flexible , trachea midline, no stridor , thyroid nl, carotid no bruit Chest - symmetrical excursion , unlabored           Heart/CV- RRR , no murmur , no gallop  , no rub, nl s1 s2                           - JVD- none , edema- none, stasis changes- none, varices- none           Lung- clear to P&A, wheeze-  none, cough- none , dullness-none, rub- none           Chest wall-  Abd-  Br/ Gen/ Rectal- Not done, not indicated Extrem- cyanosis- none, clubbing, none, atrophy- none, strength- nl Neuro- grossly intact to observation

## 2014-02-05 NOTE — Patient Instructions (Signed)
An otc ear wax kit can help clean your ear.  Ok to continue present meds - call for refills as needed  We can continue allergy  Vaccine  Please call as needed

## 2014-02-07 ENCOUNTER — Other Ambulatory Visit: Payer: Self-pay | Admitting: *Deleted

## 2014-02-07 MED ORDER — OMEPRAZOLE 40 MG PO CPDR: 40.0000 mg | DELAYED_RELEASE_CAPSULE | Freq: Two times a day (BID) | ORAL | Status: DC

## 2014-02-07 NOTE — Assessment & Plan Note (Signed)
She never smoked and I'm not sure she has significant fixed obstructive disease so asthma may be a better diagnosis. This would be mild intermittent well-controlled.

## 2014-02-07 NOTE — Assessment & Plan Note (Signed)
She continues allergy vaccine through our office now with no problems, and believes it is helpful. Uses Nasacort appropriately.

## 2014-02-12 ENCOUNTER — Ambulatory Visit (INDEPENDENT_AMBULATORY_CARE_PROVIDER_SITE_OTHER): Payer: Medicare Other

## 2014-02-12 DIAGNOSIS — J309 Allergic rhinitis, unspecified: Secondary | ICD-10-CM

## 2014-02-19 ENCOUNTER — Ambulatory Visit (INDEPENDENT_AMBULATORY_CARE_PROVIDER_SITE_OTHER): Payer: Medicare Other

## 2014-02-19 DIAGNOSIS — J309 Allergic rhinitis, unspecified: Secondary | ICD-10-CM

## 2014-02-26 ENCOUNTER — Ambulatory Visit (INDEPENDENT_AMBULATORY_CARE_PROVIDER_SITE_OTHER): Payer: Medicare Other

## 2014-02-26 DIAGNOSIS — J309 Allergic rhinitis, unspecified: Secondary | ICD-10-CM

## 2014-03-05 ENCOUNTER — Ambulatory Visit (INDEPENDENT_AMBULATORY_CARE_PROVIDER_SITE_OTHER): Payer: Medicare Other

## 2014-03-05 DIAGNOSIS — J309 Allergic rhinitis, unspecified: Secondary | ICD-10-CM

## 2014-03-08 ENCOUNTER — Other Ambulatory Visit: Payer: Self-pay | Admitting: *Deleted

## 2014-03-08 MED ORDER — OMEPRAZOLE 40 MG PO CPDR: 40.0000 mg | DELAYED_RELEASE_CAPSULE | Freq: Two times a day (BID) | ORAL | Status: DC

## 2014-03-12 ENCOUNTER — Ambulatory Visit (INDEPENDENT_AMBULATORY_CARE_PROVIDER_SITE_OTHER): Payer: Medicare Other

## 2014-03-12 DIAGNOSIS — J309 Allergic rhinitis, unspecified: Secondary | ICD-10-CM

## 2014-03-19 ENCOUNTER — Ambulatory Visit: Payer: Medicare Other

## 2014-03-20 ENCOUNTER — Ambulatory Visit (INDEPENDENT_AMBULATORY_CARE_PROVIDER_SITE_OTHER): Payer: Medicare Other

## 2014-03-20 DIAGNOSIS — J309 Allergic rhinitis, unspecified: Secondary | ICD-10-CM

## 2014-03-26 ENCOUNTER — Ambulatory Visit (INDEPENDENT_AMBULATORY_CARE_PROVIDER_SITE_OTHER): Payer: Medicare Other

## 2014-03-26 ENCOUNTER — Encounter: Payer: Self-pay | Admitting: Internal Medicine

## 2014-03-26 ENCOUNTER — Ambulatory Visit (INDEPENDENT_AMBULATORY_CARE_PROVIDER_SITE_OTHER): Payer: Medicare Other | Admitting: Internal Medicine

## 2014-03-26 ENCOUNTER — Encounter: Payer: Self-pay | Admitting: Family Medicine

## 2014-03-26 VITALS — BP 126/72 | HR 80 | Ht 68.5 in | Wt 188.2 lb

## 2014-03-26 DIAGNOSIS — J309 Allergic rhinitis, unspecified: Secondary | ICD-10-CM

## 2014-03-26 DIAGNOSIS — K219 Gastro-esophageal reflux disease without esophagitis: Secondary | ICD-10-CM

## 2014-03-26 MED ORDER — OMEPRAZOLE 40 MG PO CPDR: 40.0000 mg | DELAYED_RELEASE_CAPSULE | Freq: Two times a day (BID) | ORAL | Status: DC

## 2014-03-26 NOTE — Patient Instructions (Addendum)
We have sent the following medications to your pharmacy for you to pick up at your convenience: Prilosec  It has been recommended to you by your physician that you have a(n) colonoscopy completed. Per your request, we did not schedule the procedure(s) today. Please contact our office at [phone] should you decide to have the procedure completed.  I appreciate the opportunity to care for you. Dr Sharen HonesGutierrez

## 2014-03-26 NOTE — Progress Notes (Signed)
Jordan HorsemanJean C Patterson [birth year] [id number]  Note: This dictation was prepared with Dragon digital system. Any transcriptional errors that result from this procedure are unintentional.   History of Present Illness: This is a 74 year old white female who returns for refill of Prilosec 40 mg twice a day. She has a history of small hiatal hernia, gastroesophageal reflux and LPR. She is currently under excellent control. She has occasional dysphagia. 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 17 mm Savary dilators passed without resistance. She has never had a colonoscopy.    Past Medical History  Diagnosis Date  . Hiatal hernia   . GERD (gastroesophageal reflux disease)     [name](Khyra Viscuso) EGD - mild esophageal dysmotility and small hiatal hernia  . Hypertension   . Extrinsic asthma   . Hyperlipidemia     mild, diet controlled  . Depression   . Hypothyroidism   . Osteopenia   . Diabetes type 2, controlled 2004    diet controlled  . Macular degeneration   . Arthritis     hands and knees - ?osteo  . Environmental allergies     dust,mold,mildew  . Hx of migraines   . Chronic headaches   . Allergic rhinitis     Past Surgical History  Procedure Laterality Date  . Dilation and curettage of uterus  1978  . Tonsillectomy and adenoidectomy  1962  . Laparoscopy abdomen diagnostic      To R/O endometriosis  . Esophagogastroduodenoscopy  06/25/2011    Procedure: ESOPHAGOGASTRODUODENOSCOPY (EGD);  Surgeon: Hart Carwinora M Rexanne Inocencio, MD;  [facility];  Service: Endoscopy;  Laterality: N/A;  no Xray  . Savory dilation  06/25/2011    Procedure: SAVORY DILATION;  Surgeon: Hart Carwinora M Genell Thede, MD;  Location: [facility];  Service: Endoscopy;  Laterality: N/A;  . Cataract extraction  2003    bilateral  . Dexa  05/2008    improved (initial DEXA 2007 mild osteopenia)   . Colonoscopy      Allergies  Allergen Reactions  . Ace Inhibitors Cough  . Amlodipine Cough  . Crestor [Rosuvastatin] Other (See Comments)    myalgias  . Pregabalin   . Tegretol [Carbamazepine]     Dizziness, headache  . Wellbutrin [Bupropion Hcl]   . Cymbalta [Duloxetine Hcl] Palpitations    And headaches    Family history and social history have been reviewed.  Review of Systems:   The remainder of the 10 point ROS is negative except as outlined in the H&P  Physical Exam: General Appearance Well developed, in no distress Skin No lesions Neurological Alert and oriented x 3 Psychological Normal mood and affect  Assessment and Plan:   74 year old white female with controlled  gastroesophageal reflux disease . No LPR or stricture. We will refill Prilosec 40 mg twice a day and she will continue to follow strict antireflux measures. She has gained 5 pounds since last year and  he have discussed the increasing exercise Check B12 level  when she sees Dr Sharen HonesGutierrez next time.   Colorectal screening. Patient had a flexible sigmoidoscopy in the past but does not want to have a full colonoscopy. I have advised   her that the colorectal screening is recommended every 10 years and that she should consider screening colonoscopy. As an alternative she could have a barium enema or virtual colonoscopy  [name] (<PARAMETER> error)@

## 2014-03-28 ENCOUNTER — Other Ambulatory Visit: Payer: Self-pay | Admitting: Family Medicine

## 2014-03-28 DIAGNOSIS — R5382 Chronic fatigue, unspecified: Secondary | ICD-10-CM

## 2014-03-28 DIAGNOSIS — E039 Hypothyroidism, unspecified: Secondary | ICD-10-CM

## 2014-03-28 DIAGNOSIS — E119 Type 2 diabetes mellitus without complications: Secondary | ICD-10-CM

## 2014-03-28 DIAGNOSIS — K219 Gastro-esophageal reflux disease without esophagitis: Secondary | ICD-10-CM

## 2014-03-28 DIAGNOSIS — E785 Hyperlipidemia, unspecified: Secondary | ICD-10-CM

## 2014-03-28 DIAGNOSIS — I10 Essential (primary) hypertension: Secondary | ICD-10-CM

## 2014-04-02 ENCOUNTER — Ambulatory Visit (INDEPENDENT_AMBULATORY_CARE_PROVIDER_SITE_OTHER): Payer: Medicare Other

## 2014-04-02 DIAGNOSIS — J309 Allergic rhinitis, unspecified: Secondary | ICD-10-CM

## 2014-04-09 ENCOUNTER — Ambulatory Visit: Payer: Medicare Other

## 2014-04-10 ENCOUNTER — Ambulatory Visit (INDEPENDENT_AMBULATORY_CARE_PROVIDER_SITE_OTHER): Payer: Medicare Other

## 2014-04-10 DIAGNOSIS — J309 Allergic rhinitis, unspecified: Secondary | ICD-10-CM

## 2014-04-14 ENCOUNTER — Other Ambulatory Visit: Payer: Self-pay | Admitting: Family Medicine

## 2014-04-15 ENCOUNTER — Other Ambulatory Visit: Payer: Medicare Other

## 2014-04-15 DIAGNOSIS — Z1211 Encounter for screening for malignant neoplasm of colon: Secondary | ICD-10-CM

## 2014-04-15 LAB — FECAL OCCULT BLOOD, GUAIAC: Fecal Occult Blood: NEGATIVE

## 2014-04-15 LAB — FECAL OCCULT BLOOD, IMMUNOCHEMICAL: Fecal Occult Bld: NEGATIVE

## 2014-04-16 ENCOUNTER — Ambulatory Visit (INDEPENDENT_AMBULATORY_CARE_PROVIDER_SITE_OTHER): Payer: Medicare Other

## 2014-04-16 ENCOUNTER — Encounter: Payer: Self-pay | Admitting: *Deleted

## 2014-04-16 DIAGNOSIS — J309 Allergic rhinitis, unspecified: Secondary | ICD-10-CM

## 2014-04-23 ENCOUNTER — Ambulatory Visit (INDEPENDENT_AMBULATORY_CARE_PROVIDER_SITE_OTHER): Payer: Medicare Other

## 2014-04-23 DIAGNOSIS — J309 Allergic rhinitis, unspecified: Secondary | ICD-10-CM

## 2014-04-23 LAB — HM DIABETES EYE EXAM

## 2014-04-25 ENCOUNTER — Telehealth: Payer: Self-pay | Admitting: Internal Medicine

## 2014-04-25 NOTE — Telephone Encounter (Signed)
Pt. Is currently taking vac from [facility]: 1:100. Pt. Has 3 vials #1 has DM-CR in it,#2 T-G weekly,#3 Cat 1cc monthly. (All are 1:100) Where do we start her  on our vac.? Please advise. I'm off tomorrow,but Lillia AbedLindsay will be here. Thanks,  Hewlett-Packardammy Scott

## 2014-04-26 NOTE — Telephone Encounter (Signed)
Paper script left with allergy lab

## 2014-04-29 NOTE — Telephone Encounter (Signed)
I put the info and rx in the computer and made up vac.. It will be ready when pt. Comes in for her shot.

## 2014-05-01 ENCOUNTER — Ambulatory Visit (INDEPENDENT_AMBULATORY_CARE_PROVIDER_SITE_OTHER): Payer: Medicare Other

## 2014-05-01 DIAGNOSIS — J309 Allergic rhinitis, unspecified: Secondary | ICD-10-CM

## 2014-05-02 ENCOUNTER — Ambulatory Visit (INDEPENDENT_AMBULATORY_CARE_PROVIDER_SITE_OTHER): Payer: Medicare Other

## 2014-05-02 DIAGNOSIS — J309 Allergic rhinitis, unspecified: Secondary | ICD-10-CM

## 2014-05-07 ENCOUNTER — Ambulatory Visit (INDEPENDENT_AMBULATORY_CARE_PROVIDER_SITE_OTHER): Payer: Medicare Other

## 2014-05-07 DIAGNOSIS — J309 Allergic rhinitis, unspecified: Secondary | ICD-10-CM

## 2014-05-14 ENCOUNTER — Ambulatory Visit (INDEPENDENT_AMBULATORY_CARE_PROVIDER_SITE_OTHER): Payer: Medicare Other

## 2014-05-14 ENCOUNTER — Other Ambulatory Visit: Payer: Self-pay | Admitting: Family Medicine

## 2014-05-14 DIAGNOSIS — J309 Allergic rhinitis, unspecified: Secondary | ICD-10-CM | POA: Diagnosis not present

## 2014-05-20 ENCOUNTER — Telehealth: Payer: Self-pay | Admitting: Internal Medicine

## 2014-05-20 MED ORDER — LEVOCETIRIZINE DIHYDROCHLORIDE 5 MG PO TABS: 5.0000 mg | ORAL_TABLET | Freq: Every evening | ORAL | Status: DC

## 2014-05-20 NOTE — Telephone Encounter (Signed)
Spoke with the pt  She is asking for refill on Xyzal  Rx was sent to pharm  Nothing further needed

## 2014-05-21 ENCOUNTER — Ambulatory Visit (INDEPENDENT_AMBULATORY_CARE_PROVIDER_SITE_OTHER): Payer: Medicare Other

## 2014-05-21 DIAGNOSIS — J309 Allergic rhinitis, unspecified: Secondary | ICD-10-CM | POA: Diagnosis not present

## 2014-05-23 ENCOUNTER — Other Ambulatory Visit (INDEPENDENT_AMBULATORY_CARE_PROVIDER_SITE_OTHER): Payer: Medicare Other

## 2014-05-23 DIAGNOSIS — K219 Gastro-esophageal reflux disease without esophagitis: Secondary | ICD-10-CM | POA: Diagnosis not present

## 2014-05-23 DIAGNOSIS — E039 Hypothyroidism, unspecified: Secondary | ICD-10-CM

## 2014-05-23 DIAGNOSIS — E119 Type 2 diabetes mellitus without complications: Secondary | ICD-10-CM | POA: Diagnosis not present

## 2014-05-23 DIAGNOSIS — R5382 Chronic fatigue, unspecified: Secondary | ICD-10-CM | POA: Diagnosis not present

## 2014-05-23 DIAGNOSIS — E785 Hyperlipidemia, unspecified: Secondary | ICD-10-CM

## 2014-05-23 LAB — VITAMIN B12: Vitamin B-12: 403 pg/mL (ref 211–911)

## 2014-05-23 LAB — LIPID PANEL
Cholesterol: 256 mg/dL — ABNORMAL HIGH (ref 0–200)
HDL: 41.1 mg/dL (ref >39.00)
NonHDL: 214.9
Total CHOL/HDL Ratio: 6
Triglycerides: 250 mg/dL — ABNORMAL HIGH (ref 0.0–149.0)
VLDL: 50 mg/dL — ABNORMAL HIGH (ref 0.0–40.0)

## 2014-05-23 LAB — COMPREHENSIVE METABOLIC PANEL
ALT: 20 U/L (ref 0–35)
AST: 21 U/L (ref 0–37)
Albumin: 4.1 g/dL (ref 3.5–5.2)
Alkaline Phosphatase: 62 U/L (ref 39–117)
BUN: 16 mg/dL (ref 6–23)
CO2: 29 mEq/L (ref 19–32)
Calcium: 9.4 mg/dL (ref 8.4–10.5)
Chloride: 101 mEq/L (ref 96–112)
Creatinine, Ser: 0.91 mg/dL (ref 0.40–1.20)
GFR: 64.3 mL/min (ref >60.00)
Glucose, Bld: 141 mg/dL — ABNORMAL HIGH (ref 70–99)
Potassium: 4.5 mEq/L (ref 3.5–5.1)
Sodium: 137 mEq/L (ref 135–145)
Total Bilirubin: 0.4 mg/dL (ref 0.2–1.2)
Total Protein: 6.9 g/dL (ref 6.0–8.3)

## 2014-05-23 LAB — LDL CHOLESTEROL, DIRECT: Direct LDL: 154 mg/dL

## 2014-05-23 LAB — HEMOGLOBIN A1C: Hgb A1c MFr Bld: 7.1 % — ABNORMAL HIGH (ref 4.6–6.5)

## 2014-05-23 LAB — TSH: TSH: 4.85 u[IU]/mL — ABNORMAL HIGH (ref 0.35–4.50)

## 2014-05-23 NOTE — Addendum Note (Signed)
Addended by: Alvina ChouWALSH, TERRI J on: 05/23/2014 09:38 AM   Modules accepted: Orders

## 2014-05-28 LAB — MICROALBUMIN / CREATININE URINE RATIO
Creatinine,U: 52.2 mg/dL
Microalb Creat Ratio: 1.3 mg/g (ref 0.0–30.0)
Microalb, Ur: <0.7 mg/dL (ref 0.0–1.9)

## 2014-05-28 NOTE — Addendum Note (Signed)
Addended by: Baldomero LamyHAVERS, NATASHA C on: 05/28/2014 09:23 AM   Modules accepted: Orders

## 2014-05-29 ENCOUNTER — Ambulatory Visit (INDEPENDENT_AMBULATORY_CARE_PROVIDER_SITE_OTHER): Payer: Medicare Other

## 2014-05-29 DIAGNOSIS — J309 Allergic rhinitis, unspecified: Secondary | ICD-10-CM | POA: Diagnosis not present

## 2014-05-30 ENCOUNTER — Ambulatory Visit (INDEPENDENT_AMBULATORY_CARE_PROVIDER_SITE_OTHER): Payer: Medicare Other | Admitting: Family Medicine

## 2014-05-30 ENCOUNTER — Encounter: Payer: Self-pay | Admitting: Family Medicine

## 2014-05-30 ENCOUNTER — Ambulatory Visit (INDEPENDENT_AMBULATORY_CARE_PROVIDER_SITE_OTHER)
Admission: RE | Admit: 2014-05-30 | Discharge: 2014-05-30 | Disposition: A | Discharge status: Home / Self Care | Payer: Medicare Other | Source: Ambulatory Visit | Attending: Family Medicine | Admitting: Family Medicine

## 2014-05-30 ENCOUNTER — Ambulatory Visit: Payer: Medicare Other | Admitting: Family Medicine

## 2014-05-30 VITALS — BP 160/96 | HR 96 | Temp 98.2°F | Wt 182.5 lb

## 2014-05-30 DIAGNOSIS — M25561 Pain in right knee: Secondary | ICD-10-CM | POA: Diagnosis not present

## 2014-05-30 DIAGNOSIS — E785 Hyperlipidemia, unspecified: Secondary | ICD-10-CM

## 2014-05-30 DIAGNOSIS — E119 Type 2 diabetes mellitus without complications: Secondary | ICD-10-CM | POA: Diagnosis not present

## 2014-05-30 DIAGNOSIS — I10 Essential (primary) hypertension: Secondary | ICD-10-CM | POA: Diagnosis not present

## 2014-05-30 DIAGNOSIS — E039 Hypothyroidism, unspecified: Secondary | ICD-10-CM

## 2014-05-30 MED ORDER — METFORMIN HCL 500 MG PO TABS: 500.0000 mg | ORAL_TABLET | Freq: Every day | ORAL | Status: DC

## 2014-05-30 NOTE — Assessment & Plan Note (Signed)
Anticipate meniscal irritation vs arthritis - check xrays today. Continue knee brace, ice and provided with exercises for meniscal tear. If no better, refer to PT. Pt agrees with plan.

## 2014-05-30 NOTE — Assessment & Plan Note (Signed)
Remains hesitant for statin given prior intolerance to crestor. Aware of health risks of not lowering chol levels in diabetic patient. Pt will research zetia and notify me if desires to try this. Continue to discuss less potent statins, but currently declines.

## 2014-05-30 NOTE — Progress Notes (Signed)
Pre visit review using our clinic review tool, if applicable. No additional management support is needed unless otherwise documented below in the visit note. 

## 2014-05-30 NOTE — Assessment & Plan Note (Signed)
Reviewed trend in A1c's  Start metformin 500mg  daily with breakfast, discussed side effects to monitor for.

## 2014-05-30 NOTE — Patient Instructions (Addendum)
Look into zetia (ezetimibe) for cholesterol because your levels remain high Start metformin 500mg  with breakfast every day. Take tylenol 500mg  2-3 times daily instead of ibuprofen. Start aspirin 81mg  MWF. For knee - xray today. Knee exercises today. If no improvement let us know for PT referral. May continue ice, brace, and tylenol Call me or send me blood pressure log from home in 1 week. If persistently elevated we will add second blood pressure medicine. Return as needed or in 4 months for follow up.

## 2014-05-30 NOTE — Assessment & Plan Note (Signed)
bp elevated today - pt will monitor at home and update me with #s in 1 wk to recheck. If persistently elevated at home, start ARB. Asxs, reliable patient.

## 2014-05-30 NOTE — Progress Notes (Signed)
BP 160/96 mmHg  Pulse 96  Temp(Src) 98.2 F (36.8 C) (Oral)  Wt 182 lb 8 oz (82.781 kg)   CC: 4 mo f/u visit  Subjective:    Patient ID: Jordan HorsemanJean C Schendel, female    DOB: 1941/01/31, 74 y.o.   MRN: [id number]  HPI: Jordan Patterson is a 74 y.o. female presenting on 05/30/2014 for Follow-up and Knee Pain   HLD - LDL recently found to be >200. Atorvastatin 40mg  daily was started but she did not take this medication. Aware of risk of stroke. Due for f/u labs. Prior did not tolerate crestor - myalgias.   DM - h/o diet controlled diabetes. Latest A1c elevated. Regularly does not check sugars. Compliant with antihyperglycemic regimen which includes: diet controlled.  Denies hypoglycemic symptoms.  Denies paresthesias. Last diabetic eye exam 03/2013 - upcoming appointment.  Pneumovax: 2007.  Prevnar: DUE (declines).  Lab Results  Component Value Date   HGBA1C 7.1* 05/23/2014   Diabetic Foot Exam - Simple   Simple Foot Form  Diabetic Foot exam was performed with the following findings:  Yes 05/30/2014 11:09 AM  Visual Inspection  No deformities, no ulcerations, no other skin breakdown bilaterally:  Yes  Sensation Testing  Intact to touch and monofilament testing bilaterally:  Yes  Pulse Check  Posterior Tibialis and Dorsalis pulse intact bilaterally:  Yes  Comments  calluses      HTN - Compliant with current antihypertensive regimen of triamterene/hctz 37.5/25 daily. Does not regularly check blood pressures at home. No low blood pressure readings or symptoms of dizziness/syncope. Denies HA, vision changes, CP/tightness, SOB. Mild leg swelling.   R knee pain - 5 mo h/o this. Day after thanksgiving was putting something into kitchen cabinet, fell to floor on right knee. Thought bruise but pain persists. Has seen chiropractor - rec steel support brace which helps. Persistent pain lateral knee below kneecap. Pain worse with prolonged standing/ weight bearing. Better with activity. No instability or  locking. No significant trouble going up and down stairs.  Hypothyroid - on synthroid brand 88mcg daily for last several years.  Relevant past medical, surgical, family and social history reviewed and updated as indicated. Interim medical history since our last visit reviewed. Allergies and medications reviewed and updated. Current Outpatient Prescriptions on File Prior to Visit  Medication Sig  . albuterol (VENTOLIN HFA) 108 (90 BASE) MCG/ACT inhaler Inhale 2 puffs into the lungs every 6 (six) hours as needed for wheezing or shortness of breath.  . Coenzyme Q10 (COQ10) 100 MG CAPS Take 1 capsule by mouth daily.  Marland Kitchen. EPINEPHrine 0.3 mg/0.3 mL IJ SOAJ injection Inject 0.3 mLs (0.3 mg total) into the muscle once.  . Garlic (GARLIQUE PO) Take 1 tablet by mouth daily.  Marland Kitchen. glucose blood test strip ONE TOUCH ULTRA MINI Use as instructed to check sugar 2-3 daily. Dx: 250.00  . levocetirizine (XYZAL) 5 MG tablet Take 1 tablet (5 mg total) by mouth every evening.  . Magnesium 250 MG TABS Take 250 mg by mouth daily.  . Melatonin 5 MG TABS Take 5 mg by mouth daily.  . Misc Natural Products (OSTEO BI-FLEX ADV DOUBLE ST PO) Take by mouth 2 (two) times daily.  . montelukast (SINGULAIR) 10 MG tablet Take 1 tablet (10 mg total) by mouth at bedtime.  . Multiple Vitamins-Minerals (MULTIVITAMIN PO) Take 1 tablet by mouth daily.  . Multiple Vitamins-Minerals (VISION VITAMINS) TABS Take by mouth daily.  Marland Kitchen. omeprazole (PRILOSEC) 40 MG capsule Take 1 capsule (  40 mg total) by mouth 2 (two) times daily.  . Polyvinyl Alcohol-Povidone (REFRESH OP) Apply to eye daily.  Marland Kitchen SYNTHROID 88 MCG tablet TAKE 1 TABLET BY MOUTH DAILY  . triamcinolone (NASACORT ALLERGY 24HR) 55 MCG/ACT AERO nasal inhaler Place 2 sprays into the nose as needed.   . triamterene-hydrochlorothiazide (DYAZIDE) 37.5-25 MG per capsule TAKE ONE CAPSULE BY MOUTH EVERY MORNING   No current facility-administered medications on file prior to visit.    Review  of Systems Per HPI unless specifically indicated above     Objective:    BP 160/96 mmHg  Pulse 96  Temp(Src) 98.2 F (36.8 C) (Oral)  Wt 182 lb 8 oz (82.781 kg)  Wt Readings from Last 3 Encounters:  05/30/14 182 lb 8 oz (82.781 kg)  03/26/14 188 lb 3.2 oz (85.367 kg)  02/05/14 185 lb (83.915 kg)    Physical Exam  Constitutional: She appears well-developed and well-nourished. No distress.  HENT:  Head: Normocephalic and atraumatic.  Right Ear: External ear normal.  Left Ear: External ear normal.  Nose: Nose normal.  Mouth/Throat: Oropharynx is clear and moist. No oropharyngeal exudate.  Eyes: Conjunctivae and EOM are normal. Pupils are equal, round, and reactive to light. No scleral icterus.  Neck: Normal range of motion. Neck supple.  Cardiovascular: Normal rate, regular rhythm, normal heart sounds and intact distal pulses.   No murmur heard. Pulmonary/Chest: Effort normal and breath sounds normal. No respiratory distress. She has no wheezes. She has no rales.  Musculoskeletal: She exhibits no edema.  See HPI for foot exam if done L knee WNL R Knee exam: +Small patellar effusion No pain with palpation of knee landmarks. FROM in flex/extension with minimal crepitus. No popliteal fullness. Neg drawer test. +Painful with mcmurray test. No pain with valgus/varus stress. No PFgrind. No abnormal patellar mobility.  Lymphadenopathy:    She has no cervical adenopathy.  Skin: Skin is warm and dry. No rash noted.  Psychiatric: She has a normal mood and affect.  Nursing note and vitals reviewed.  Results for orders placed or performed in visit on 05/23/14  Vitamin B12  Result Value Ref Range   Vitamin B-12 403 211 - 911 pg/mL  Lipid panel  Result Value Ref Range   Cholesterol 256 (H) 0 - 200 mg/dL   Triglycerides 811.9 (H) 0.0 - 149.0 mg/dL   HDL 14.78 >29.56 mg/dL   VLDL 21.3 (H) 0.0 - 08.6 mg/dL   Total CHOL/HDL Ratio 6    NonHDL 214.90   Comprehensive metabolic  panel  Result Value Ref Range   Sodium 137 135 - 145 mEq/L   Potassium 4.5 3.5 - 5.1 mEq/L   Chloride 101 96 - 112 mEq/L   CO2 29 19 - 32 mEq/L   Glucose, Bld 141 (H) 70 - 99 mg/dL   BUN 16 6 - 23 mg/dL   Creatinine, Ser 5.78 0.40 - 1.20 mg/dL   Total Bilirubin 0.4 0.2 - 1.2 mg/dL   Alkaline Phosphatase 62 39 - 117 U/L   AST 21 0 - 37 U/L   ALT 20 0 - 35 U/L   Total Protein 6.9 6.0 - 8.3 g/dL   Albumin 4.1 3.5 - 5.2 g/dL   Calcium 9.4 8.4 - 46.9 mg/dL   GFR 62.95 >28.41 mL/min  TSH  Result Value Ref Range   TSH 4.85 (H) 0.35 - 4.50 uIU/mL  Hemoglobin A1c  Result Value Ref Range   Hgb A1c MFr Bld 7.1 (H) 4.6 - 6.5 %  LDL cholesterol, direct  Result Value Ref Range   Direct LDL 154.0 mg/dL  Microalbumin / creatinine urine ratio  Result Value Ref Range   Microalb, Ur <0.7 0.0 - 1.9 mg/dL   Creatinine,U 40.9 mg/dL   Microalb Creat Ratio 1.3 0.0 - 30.0 mg/g      Assessment & Plan:   Problem List Items Addressed This Visit    Right knee pain    Anticipate meniscal irritation vs arthritis - check xrays today. Continue knee brace, ice and provided with exercises for meniscal tear. If no better, refer to PT. Pt agrees with plan.      Relevant Orders   DG Knee Complete 4 Views Right   Hypothyroidism    Asxs. Monitor sxs for now, recheck TSH next visit.      Hypertension    bp elevated today - pt will monitor at home and update me with #s in 1 wk to recheck. If persistently elevated at home, start ARB. Asxs, reliable patient.      Relevant Medications   aspirin EC 81 MG tablet   Hyperlipidemia    Remains hesitant for statin given prior intolerance to crestor. Aware of health risks of not lowering chol levels in diabetic patient. Pt will research zetia and notify me if desires to try this. Continue to discuss less potent statins, but currently declines.      Relevant Medications   aspirin EC 81 MG tablet   Diabetes type 2, controlled - Primary    Reviewed trend in  A1c's  Start metformin  daily with breakfast, discussed side effects to monitor for.      Relevant Medications   metFORMIN (GLUCOPHAGE) tablet   aspirin EC 81 MG tablet       Follow up plan: Return in about 4 months (around 09/29/2014), or as needed, for follow up visit.

## 2014-05-30 NOTE — Assessment & Plan Note (Signed)
Asxs. Monitor sxs for now, recheck TSH next visit.

## 2014-06-04 DIAGNOSIS — H43813 Vitreous degeneration, bilateral: Secondary | ICD-10-CM | POA: Insufficient documentation

## 2014-06-04 DIAGNOSIS — H35033 Hypertensive retinopathy, bilateral: Secondary | ICD-10-CM | POA: Insufficient documentation

## 2014-06-04 DIAGNOSIS — H353190 Nonexudative age-related macular degeneration, unspecified eye, stage unspecified: Secondary | ICD-10-CM | POA: Insufficient documentation

## 2014-06-04 DIAGNOSIS — Z961 Presence of intraocular lens: Secondary | ICD-10-CM | POA: Insufficient documentation

## 2014-06-05 ENCOUNTER — Ambulatory Visit (INDEPENDENT_AMBULATORY_CARE_PROVIDER_SITE_OTHER): Payer: Medicare Other

## 2014-06-05 DIAGNOSIS — J309 Allergic rhinitis, unspecified: Secondary | ICD-10-CM

## 2014-06-11 ENCOUNTER — Encounter: Payer: Self-pay | Admitting: Family Medicine

## 2014-06-11 ENCOUNTER — Ambulatory Visit (INDEPENDENT_AMBULATORY_CARE_PROVIDER_SITE_OTHER): Payer: Medicare Other

## 2014-06-11 ENCOUNTER — Encounter: Payer: Self-pay | Admitting: Internal Medicine

## 2014-06-11 DIAGNOSIS — J309 Allergic rhinitis, unspecified: Secondary | ICD-10-CM

## 2014-06-11 NOTE — Telephone Encounter (Signed)
LMTCB to get more details on cough

## 2014-06-11 NOTE — Telephone Encounter (Signed)
Please see Mychart message.

## 2014-06-12 ENCOUNTER — Telehealth: Payer: Self-pay | Admitting: Internal Medicine

## 2014-06-12 NOTE — Telephone Encounter (Signed)
CY - please advise. Thanks! 

## 2014-06-12 NOTE — Telephone Encounter (Signed)
Duplicate message. MyChart message has been sent back to the pt regarding this.

## 2014-06-12 NOTE — Telephone Encounter (Signed)
Mild cough ok as long as asthma meds control it. Pollen levels will be high for another month. Ok to take the aspirin under the supervision of her PCP. Notice if bleeding tendency gets much worse. BP is not an issue- continue to manage with PCP

## 2014-06-13 MED ORDER — LOSARTAN POTASSIUM 25 MG PO TABS: 25.0000 mg | ORAL_TABLET | Freq: Every day | ORAL | Status: DC

## 2014-06-13 NOTE — Telephone Encounter (Signed)
Please see Mychart message.

## 2014-06-14 ENCOUNTER — Encounter: Payer: Self-pay | Admitting: Family Medicine

## 2014-06-17 ENCOUNTER — Encounter: Payer: Self-pay | Admitting: Family Medicine

## 2014-06-18 ENCOUNTER — Ambulatory Visit (INDEPENDENT_AMBULATORY_CARE_PROVIDER_SITE_OTHER): Payer: Medicare Other

## 2014-06-18 DIAGNOSIS — J309 Allergic rhinitis, unspecified: Secondary | ICD-10-CM

## 2014-06-19 ENCOUNTER — Encounter: Payer: Self-pay | Admitting: Internal Medicine

## 2014-06-20 ENCOUNTER — Encounter: Payer: Self-pay | Admitting: Family Medicine

## 2014-06-20 ENCOUNTER — Ambulatory Visit (INDEPENDENT_AMBULATORY_CARE_PROVIDER_SITE_OTHER): Payer: Medicare Other | Admitting: Family Medicine

## 2014-06-20 VITALS — BP 142/84 | HR 66 | Temp 98.1°F | Wt 183.4 lb

## 2014-06-20 DIAGNOSIS — M25561 Pain in right knee: Secondary | ICD-10-CM

## 2014-06-20 DIAGNOSIS — I10 Essential (primary) hypertension: Secondary | ICD-10-CM

## 2014-06-20 NOTE — Progress Notes (Signed)
BP 142/84 mmHg  Pulse 66  Temp(Src) 98.1 F (36.7 C) (Oral)  Wt 183 lb 6.4 oz (83.19 kg)   CC: discuss xray  Subjective:    Patient ID: Jordan Patterson, female    DOB: 1940/05/22, 74 y.o.   MRN: [id number]  HPI: Jordan Patterson is a 74 y.o. female presenting on 06/20/2014 for Arthritis   Seen here 05/30/2014 with R knee pain ongoing over last 5-6 months - anticipated meniscal injury vs arthritis and xrays returned with mild osteoarthritis/spurs. She started taking tylenol but was worried this raised her blood pressure. Decided to stop tylenol and aleve because was worried with elevated readings. Persistent knee pain R lateral knee. She has been doing exercises which have been helpful. Interested in formal therapy  Family stress - SIL in hospital.   Brings log of blood pressures - 120-140/70-80s, HR 70s.   Relevant past medical, surgical, family and social history reviewed and updated as indicated. Interim medical history since our last visit reviewed. Allergies and medications reviewed and updated. Current Outpatient Prescriptions on File Prior to Visit  Medication Sig  . acetaminophen (TYLENOL) 500 MG tablet Take 500 mg by mouth 3 (three) times daily as needed.  Marland Kitchen albuterol (VENTOLIN HFA) 108 (90 BASE) MCG/ACT inhaler Inhale 2 puffs into the lungs every 6 (six) hours as needed for wheezing or shortness of breath.  Marland Kitchen aspirin EC 81 MG tablet Take 81 mg by mouth every Monday, Wednesday, and Friday.  . Coenzyme Q10 (COQ10) 100 MG CAPS Take 1 capsule by mouth daily.  Marland Kitchen EPINEPHrine 0.3 mg/0.3 mL IJ SOAJ injection Inject 0.3 mLs (0.3 mg total) into the muscle once.  . Garlic (GARLIQUE PO) Take 1 tablet by mouth daily.  Marland Kitchen glucose blood test strip ONE TOUCH ULTRA MINI Use as instructed to check sugar 2-3 daily. Dx: 250.00  . levocetirizine (XYZAL) 5 MG tablet Take 1 tablet (5 mg total) by mouth every evening.  Marland Kitchen losartan (COZAAR) 25 MG tablet Take 1 tablet (25 mg total) by mouth daily.  .  Magnesium 250 MG TABS Take 250 mg by mouth daily.  . Melatonin 5 MG TABS Take 5 mg by mouth daily.  . metFORMIN (GLUCOPHAGE) 500 MG tablet Take 1 tablet (500 mg total) by mouth daily with breakfast.  . Misc Natural Products (OSTEO BI-FLEX ADV DOUBLE ST PO) Take by mouth 2 (two) times daily.  . montelukast (SINGULAIR) 10 MG tablet Take 1 tablet (10 mg total) by mouth at bedtime.  . Multiple Vitamins-Minerals (MULTIVITAMIN PO) Take 1 tablet by mouth daily.  . Multiple Vitamins-Minerals (VISION VITAMINS) TABS Take by mouth daily.  . NON FORMULARY Allergy injections  . omeprazole (PRILOSEC) 40 MG capsule Take 1 capsule (40 mg total) by mouth 2 (two) times daily.  . Polyvinyl Alcohol-Povidone (REFRESH OP) Apply to eye daily.  Marland Kitchen SYNTHROID 88 MCG tablet TAKE 1 TABLET BY MOUTH DAILY  . triamcinolone (NASACORT ALLERGY 24HR) 55 MCG/ACT AERO nasal inhaler Place 2 sprays into the nose as needed.   . triamterene-hydrochlorothiazide (DYAZIDE) 37.5-25 MG per capsule TAKE ONE CAPSULE BY MOUTH EVERY MORNING   No current facility-administered medications on file prior to visit.    Review of Systems Per HPI unless specifically indicated above     Objective:    BP 142/84 mmHg  Pulse 66  Temp(Src) 98.1 F (36.7 C) (Oral)  Wt 183 lb 6.4 oz (83.19 kg)  Wt Readings from Last 3 Encounters:  06/20/14 183 lb 6.4 oz (83.19  kg)  05/30/14 182 lb 8 oz (82.781 kg)  03/26/14 188 lb 3.2 oz (85.367 kg)    Physical Exam  Constitutional: She appears well-developed and well-nourished. No distress.  Musculoskeletal: She exhibits no edema.  B knees with crepitus. R Knee exam: No deformity on inspection. Mild pain with palpation of lateral knee Mild effusion/swelling noted. FROM in flex and some limited in extension with marked crepitus. No popliteal fullness. Neg drawer test. Uncomfortable with mcmurray test. No pain with valgus/varus stress. No PFgrind. No abnormal patellar mobility.  Skin: Skin is warm and  dry. No rash noted.  Mild varicosities  Nursing note and vitals reviewed.   RIGHT KNEE - COMPLETE 4+ VIEW COMPARISON: None. FINDINGS: The bones of the right knee are adequately mineralized. There is beaking of the tibial spines. The joint spaces are preserved. There are tiny spurs arising from the superior and inferior articular margins of the patella. There is a small joint effusion. There is no acute or old fracture nor dislocation.  IMPRESSION: There are mild osteoarthritic changes involving all 3 joint compartments without significant joint space narrowing. There is no acute bony abnormality. Electronically Signed  By: David SwazilandJordan  On: 05/30/2014 12:42    Assessment & Plan:  A total of 25 minutes were spent face-to-face with the patient during this encounter and over half of that time was spent on counseling and coordination of care Problem List Items Addressed This Visit    Right knee pain - Primary    Known mild osteoarthritis as well as anticipated mild lateral meniscal injury after fall onto kneecap. Discussed pros/cons of steroid injection . Pt declines for now. Will refer to formal PT program and pt will update me with effect. If no better with this, will return for steroid injection or notify us for referral to ortho. Discussed tylenol 500mg  TID scheduled.      Relevant Orders   Ambulatory referral to Physical Therapy   Hypertension    BP better today off NSAID, anticipate pain related. Pt declines losartan. Continue maxzide.          Follow up plan: Return if symptoms worsen or fail to improve.

## 2014-06-20 NOTE — Assessment & Plan Note (Signed)
BP better today off NSAID, anticipate pain related. Pt declines losartan. Continue maxzide.

## 2014-06-20 NOTE — Patient Instructions (Signed)
Pass by Marion's office to schedule physical therapy. Tylenol 500mg  three times daily. Update us with effect.

## 2014-06-20 NOTE — Assessment & Plan Note (Signed)
Known mild osteoarthritis as well as anticipated mild lateral meniscal injury after fall onto kneecap. Discussed pros/cons of steroid injection . Pt declines for now. Will refer to formal PT program and pt will update me with effect. If no better with this, will return for steroid injection or notify us for referral to ortho. Discussed tylenol 500mg  TID scheduled.

## 2014-06-20 NOTE — Progress Notes (Signed)
Pre visit review using our clinic review tool, if applicable. No additional management support is needed unless otherwise documented below in the visit note. 

## 2014-06-26 ENCOUNTER — Ambulatory Visit (INDEPENDENT_AMBULATORY_CARE_PROVIDER_SITE_OTHER): Payer: Medicare Other

## 2014-06-26 DIAGNOSIS — J309 Allergic rhinitis, unspecified: Secondary | ICD-10-CM

## 2014-06-28 ENCOUNTER — Encounter: Payer: Self-pay | Admitting: Family Medicine

## 2014-06-28 ENCOUNTER — Ambulatory Visit (INDEPENDENT_AMBULATORY_CARE_PROVIDER_SITE_OTHER): Payer: Medicare Other

## 2014-06-28 ENCOUNTER — Encounter: Payer: Self-pay | Admitting: Internal Medicine

## 2014-06-28 DIAGNOSIS — J309 Allergic rhinitis, unspecified: Secondary | ICD-10-CM | POA: Diagnosis not present

## 2014-06-29 NOTE — Telephone Encounter (Signed)
See mychart message - plz fax latest office visit and xray report [facility] Challengerattn [facility] on Monday. Thanks.

## 2014-07-02 ENCOUNTER — Ambulatory Visit (INDEPENDENT_AMBULATORY_CARE_PROVIDER_SITE_OTHER): Payer: Medicare Other

## 2014-07-02 DIAGNOSIS — J309 Allergic rhinitis, unspecified: Secondary | ICD-10-CM

## 2014-07-08 ENCOUNTER — Encounter: Payer: Self-pay | Admitting: Family Medicine

## 2014-07-08 ENCOUNTER — Ambulatory Visit (INDEPENDENT_AMBULATORY_CARE_PROVIDER_SITE_OTHER)
Admission: RE | Admit: 2014-07-08 | Discharge: 2014-07-08 | Disposition: A | Discharge status: Home / Self Care | Payer: Medicare Other | Source: Ambulatory Visit | Attending: Family Medicine | Admitting: Family Medicine

## 2014-07-08 ENCOUNTER — Ambulatory Visit (INDEPENDENT_AMBULATORY_CARE_PROVIDER_SITE_OTHER): Payer: Medicare Other | Admitting: Family Medicine

## 2014-07-08 ENCOUNTER — Encounter: Payer: Self-pay | Admitting: Internal Medicine

## 2014-07-08 VITALS — BP 150/80 | HR 90 | Temp 98.6°F | Ht 68.0 in | Wt 183.5 lb

## 2014-07-08 DIAGNOSIS — W19XXXA Unspecified fall, initial encounter: Secondary | ICD-10-CM | POA: Diagnosis not present

## 2014-07-08 DIAGNOSIS — M545 Low back pain, unspecified: Secondary | ICD-10-CM

## 2014-07-08 DIAGNOSIS — I10 Essential (primary) hypertension: Secondary | ICD-10-CM

## 2014-07-08 MED ORDER — CYCLOBENZAPRINE HCL 10 MG PO TABS: 5.0000 mg | ORAL_TABLET | Freq: Three times a day (TID) | ORAL | Status: DC | PRN

## 2014-07-08 NOTE — Progress Notes (Signed)
Subjective:    Patient ID: Jordan Patterson, female    DOB: [birth year], 74 y.o.   MRN: [id number]  HPI Here for c/o back pain   Had a fall Friday midday -- stepped back off a ladder and landed in a twisted position  Hurt her back - more sore than "pain" - feels like a pulled muscle  No rad to either leg and no numbness or weakness  Fell on cement floor with a rug   Hurts across both sides of low back   Then stayed at home and kept moving - walking and twisting and getting work done  [employer] some tylenol  Not a lot of relief   Getting up and down is terrible  Once in good position she can rest   Has osteopenia   Patient Active Problem List   Diagnosis Date Noted  . Right knee pain 05/30/2014  . Advanced care planning/counseling discussion 01/24/2014  . Health maintenance examination 01/24/2014  . Dysgeusia 11/12/2013  . Cough 03/19/2013  . Fatigue 06/14/2012  . Allergic conjunctivitis and rhinitis 05/15/2012  . Asthma, mild intermittent, well-controlled 03/09/2012  . Medicare annual wellness visit, subsequent 01/14/2012  . Hiatal hernia   . GERD (gastroesophageal reflux disease)   . Hypertension   . Hyperlipidemia   . Depression   . Hypothyroidism   . Osteopenia   . Diabetes type 2, controlled   . Arthritis    Past Medical History  Diagnosis Date  . Hiatal hernia   . GERD (gastroesophageal reflux disease)     [name] EGD - mild esophageal dysmotility and small hiatal hernia  . Hypertension   . Extrinsic asthma   . Hyperlipidemia     mild, diet controlled  . Depression   . Hypothyroidism   . Osteopenia   . Diabetes type 2, controlled 2004    diet controlled  . Macular degeneration   . Arthritis     hands and knees - ?osteo  . Environmental allergies     dust,mold,mildew  . Hx of migraines   . Chronic headaches   . Allergic rhinitis    Past Surgical History  Procedure Laterality Date  . Dilation and curettage of uterus  1978  .  Tonsillectomy and adenoidectomy  1962  . Laparoscopy abdomen diagnostic      To R/O endometriosis  . Esophagogastroduodenoscopy  06/25/2011    Procedure: ESOPHAGOGASTRODUODENOSCOPY (EGD);  Surgeon: Hart Carwin, MD;  Location: [facility];  Service: Endoscopy;  Laterality: N/A;  no Xray  . Savory dilation  06/25/2011    Procedure: SAVORY DILATION;  Surgeon: Hart Carwin, MD;  Location: [facility];  Service: Endoscopy;  Laterality: N/A;  . Cataract extraction  2003    bilateral  . Dexa  05/2008    improved (initial DEXA 2007 mild osteopenia)  . Colonoscopy     History  Substance Use Topics  . Smoking status: Never Smoker   . Smokeless tobacco: Never Used  . Alcohol Use: No   Family History  Problem Relation Age of Onset  . Mitral valve prolapse Father   . Mitral valve prolapse Mother   . Hypertension Mother   . Diabetes Father   . Stroke Mother 78    after valve surgery  . Stroke Paternal Grandfather   . Coronary artery disease Maternal Grandfather   . Cancer Neg Hx    Allergies  Allergen Reactions  . Ace Inhibitors Cough  . Amlodipine Cough  . Crestor [  Rosuvastatin] Other (See Comments)    myalgias  . Pregabalin   . Tegretol [Carbamazepine]     Dizziness, headache  . Wellbutrin [Bupropion Hcl]   . Cymbalta [Duloxetine Hcl] Palpitations    And headaches   Current Outpatient Prescriptions on File Prior to Visit  Medication Sig Dispense Refill  . acetaminophen (TYLENOL) 500 MG tablet Take 500 mg by mouth 3 (three) times daily as needed.    Marland Kitchen. albuterol (VENTOLIN HFA) 108 (90 BASE) MCG/ACT inhaler Inhale 2 puffs into the lungs every 6 (six) hours as needed for wheezing or shortness of breath. 1 Inhaler 5  . aspirin EC 81 MG tablet Take 81 mg by mouth every Monday, Wednesday, and Friday.    . Coenzyme Q10 (COQ10) 100 MG CAPS Take 1 capsule by mouth daily.    Marland Kitchen. EPINEPHrine 0.3 mg/0.3 mL IJ SOAJ injection Inject 0.3 mLs (0.3 mg total) into the muscle once. 1 Device 1  .  Garlic (GARLIQUE PO) Take 1 tablet by mouth daily.    Marland Kitchen. glucose blood test strip ONE TOUCH ULTRA MINI Use as instructed to check sugar 2-3 daily. Dx: 250.00 100 each 3  . levocetirizine (XYZAL) 5 MG tablet Take 1 tablet (5 mg total) by mouth every evening. 30 tablet 5  . losartan (COZAAR) 25 MG tablet Take 1 tablet (25 mg total) by mouth daily. 30 tablet 6  . Magnesium 250 MG TABS Take 250 mg by mouth daily.    . Melatonin 5 MG TABS Take 5 mg by mouth daily.    . metFORMIN (GLUCOPHAGE) 500 MG tablet Take 1 tablet (500 mg total) by mouth daily with breakfast. 90 tablet 3  . Misc Natural Products (OSTEO BI-FLEX ADV DOUBLE ST PO) Take by mouth 2 (two) times daily.    . montelukast (SINGULAIR) 10 MG tablet Take 1 tablet (10 mg total) by mouth at bedtime. 30 tablet 5  . Multiple Vitamins-Minerals (MULTIVITAMIN PO) Take 1 tablet by mouth daily.    . Multiple Vitamins-Minerals (VISION VITAMINS) TABS Take by mouth daily.    . NON FORMULARY Allergy injections    . omeprazole (PRILOSEC) 40 MG capsule Take 1 capsule (40 mg total) by mouth 2 (two) times daily. 60 capsule 11  . Polyvinyl Alcohol-Povidone (REFRESH OP) Apply to eye daily.    Marland Kitchen. SYNTHROID 88 MCG tablet TAKE 1 TABLET BY MOUTH DAILY 30 tablet 6  . triamcinolone (NASACORT ALLERGY 24HR) 55 MCG/ACT AERO nasal inhaler Place 2 sprays into the nose as needed.     . triamterene-hydrochlorothiazide (DYAZIDE) 37.5-25 MG per capsule TAKE ONE CAPSULE BY MOUTH EVERY MORNING 30 capsule 6   No current facility-administered medications on file prior to visit.       Review of Systems    Review of Systems  Constitutional: Negative for fever, appetite change, fatigue and unexpected weight change.  Eyes: Negative for pain and visual disturbance.  Respiratory: Negative for cough and shortness of breath.   Cardiovascular: Negative for cp or palpitations    Gastrointestinal: Negative for nausea, diarrhea and constipation.  Genitourinary: Negative for urgency  and frequency.  Skin: Negative for pallor or rash   MSK pos for lumbar back pain , neg for joint swelling  Neurological: Negative for weakness, light-headedness, numbness and headaches.  Hematological: Negative for adenopathy. Does not bruise/bleed easily.  Psychiatric/Behavioral: Negative for dysphoric mood. The patient is not nervous/anxious.      Objective:   Physical Exam  Constitutional: She appears well-developed and well-nourished. No distress.  overwt and well app  Eyes: Conjunctivae and EOM are normal. Pupils are equal, round, and reactive to light.  Neck: Normal range of motion. Neck supple.  Cardiovascular: Normal rate and regular rhythm.   Musculoskeletal: She exhibits tenderness. She exhibits no edema.       Lumbar back: She exhibits decreased range of motion, tenderness, bony tenderness and spasm. She exhibits no edema and no deformity.  Tender over mid lumbar vertebrae and lumbar musculature bilaterally  Flex 30 deg and ext 20 deg  Nl lateral flex bilat  Neg SLR No neuro abnormalities     Lymphadenopathy:    She has no cervical adenopathy.  Neurological: She is alert. She has normal reflexes. No cranial nerve deficit. She exhibits normal muscle tone. She displays a negative Romberg sign. Coordination and gait normal.  Slow and steady gait   Skin: Skin is warm and dry. No rash noted. No erythema. No pallor.  Psychiatric: She has a normal mood and affect.          Assessment & Plan:   Problem List Items Addressed This Visit    Fall    From low on a ladder-resulting in back pain  Disc fall prev in the home and she has sworn to stay off ladders  Understands change in balance and inc in fall risk with age and certain meds       Hypertension    bp is up today but pt is in pain from a back injury  She will watch that and f/u with her PCP if not improved this week        Low back pain - Primary    After a mild fall from a ladder  Midline pain with some mid  lumbar tenderness and hx of Openia  Xray now  Flexeril for spasm with caution of sedation  Aleve with food for pain and inflammation Continue slow walking and ice /heat      Relevant Medications   cyclobenzaprine (FLEXERIL) 10 MG tablet   Other Relevant Orders   DG Lumbar Spine Complete

## 2014-07-08 NOTE — Assessment & Plan Note (Signed)
bp is up today but pt is in pain from a back injury  She will watch that and f/u with her PCP if not improved this week

## 2014-07-08 NOTE — Assessment & Plan Note (Signed)
From low on a ladder-resulting in back pain  Disc fall prev in the home and she has sworn to stay off ladders  Understands change in balance and inc in fall risk with age and certain meds

## 2014-07-08 NOTE — Assessment & Plan Note (Signed)
After a mild fall from a ladder  Midline pain with some mid lumbar tenderness and hx of Openia  Xray now  Flexeril for spasm with caution of sedation  Aleve with food for pain and inflammation Continue slow walking and ice /heat

## 2014-07-08 NOTE — Progress Notes (Signed)
Pre visit review using our clinic review tool, if applicable. No additional management support is needed unless otherwise documented below in the visit note. 

## 2014-07-08 NOTE — Patient Instructions (Addendum)
Continue intermittent ice and heat on your back for 10 minutes at a time  Keep moving - slow walking is the best  Try flexeril 1/2 to 1 pill up to three times daily -careful of sedation  Aleve 1-2 pills with a meal up to three times daily  Xray now - we will get back to you with results   Keep an eye on your blood pressure -goal of 140/90 or below -if not going down follow up with Dr GReece Agar

## 2014-07-09 ENCOUNTER — Ambulatory Visit: Payer: Medicare Other

## 2014-07-16 ENCOUNTER — Ambulatory Visit (INDEPENDENT_AMBULATORY_CARE_PROVIDER_SITE_OTHER): Payer: Medicare Other

## 2014-07-16 DIAGNOSIS — J309 Allergic rhinitis, unspecified: Secondary | ICD-10-CM

## 2014-07-17 ENCOUNTER — Encounter: Payer: Self-pay | Admitting: Family Medicine

## 2014-07-18 ENCOUNTER — Telehealth: Payer: Self-pay | Admitting: Family Medicine

## 2014-07-18 DIAGNOSIS — M545 Low back pain, unspecified: Secondary | ICD-10-CM

## 2014-07-18 DIAGNOSIS — S22080A Wedge compression fracture of T11-T12 vertebra, initial encounter for closed fracture: Secondary | ICD-10-CM

## 2014-07-18 DIAGNOSIS — Z8781 Personal history of (healed) traumatic fracture: Secondary | ICD-10-CM | POA: Insufficient documentation

## 2014-07-18 DIAGNOSIS — S22080S Wedge compression fracture of T11-T12 vertebra, sequela: Secondary | ICD-10-CM

## 2014-07-18 HISTORY — DX: Wedge compression fracture of T11-T12 vertebra, initial encounter for closed fracture: S22.080A

## 2014-07-18 HISTORY — DX: Personal history of (healed) traumatic fracture: Z87.81

## 2014-07-18 MED ORDER — CALCITONIN (SALMON) 200 UNIT/ACT NA SOLN: 1.0000 | Freq: Every day | NASAL | Status: DC

## 2014-07-18 NOTE — Telephone Encounter (Signed)
Ortho consult for back pain  See mychart note

## 2014-07-23 ENCOUNTER — Encounter: Payer: Self-pay | Admitting: Family Medicine

## 2014-07-23 ENCOUNTER — Ambulatory Visit (INDEPENDENT_AMBULATORY_CARE_PROVIDER_SITE_OTHER): Payer: Medicare Other

## 2014-07-23 DIAGNOSIS — J309 Allergic rhinitis, unspecified: Secondary | ICD-10-CM | POA: Diagnosis not present

## 2014-07-24 DIAGNOSIS — M8080XA Other osteoporosis with current pathological fracture, unspecified site, initial encounter for fracture: Secondary | ICD-10-CM

## 2014-07-24 DIAGNOSIS — M810 Age-related osteoporosis without current pathological fracture: Secondary | ICD-10-CM

## 2014-07-24 HISTORY — DX: Other osteoporosis with current pathological fracture, unspecified site, initial encounter for fracture: M80.80XA

## 2014-07-24 HISTORY — DX: Age-related osteoporosis without current pathological fracture: M81.0

## 2014-07-25 ENCOUNTER — Encounter: Payer: Self-pay | Admitting: Family Medicine

## 2014-07-26 ENCOUNTER — Encounter: Payer: Self-pay | Admitting: Family Medicine

## 2014-07-29 MED ORDER — LOSARTAN POTASSIUM 50 MG PO TABS: 50.0000 mg | ORAL_TABLET | Freq: Every day | ORAL | Status: DC

## 2014-07-29 MED ORDER — LOSARTAN POTASSIUM 25 MG PO TABS: 25.0000 mg | ORAL_TABLET | Freq: Every day | ORAL | Status: DC

## 2014-07-29 MED ORDER — NAPROXEN 500 MG PO TABS: 500.0000 mg | ORAL_TABLET | Freq: Two times a day (BID) | ORAL | Status: DC | PRN

## 2014-07-29 NOTE — Telephone Encounter (Signed)
Lab Results  Component Value Date   CREATININE 0.91 05/23/2014

## 2014-07-29 NOTE — Addendum Note (Signed)
Addended by: Eustaquio BoydenGUTIERREZ, Alexandru Moorer on: 07/29/2014 02:10 PM   Modules accepted: Orders

## 2014-07-29 NOTE — Telephone Encounter (Signed)
Please see Mychart message from patient.  

## 2014-07-30 ENCOUNTER — Ambulatory Visit (INDEPENDENT_AMBULATORY_CARE_PROVIDER_SITE_OTHER): Payer: Medicare Other

## 2014-07-30 DIAGNOSIS — J309 Allergic rhinitis, unspecified: Secondary | ICD-10-CM | POA: Diagnosis not present

## 2014-08-06 ENCOUNTER — Encounter: Payer: Self-pay | Admitting: Family Medicine

## 2014-08-06 ENCOUNTER — Ambulatory Visit: Payer: Medicare Other

## 2014-08-06 ENCOUNTER — Ambulatory Visit (INDEPENDENT_AMBULATORY_CARE_PROVIDER_SITE_OTHER): Payer: Medicare Other | Admitting: Family Medicine

## 2014-08-06 VITALS — BP 154/84 | HR 96 | Temp 98.2°F | Wt 183.5 lb

## 2014-08-06 DIAGNOSIS — M8580 Other specified disorders of bone density and structure, unspecified site: Secondary | ICD-10-CM

## 2014-08-06 DIAGNOSIS — H918X1 Other specified hearing loss, right ear: Secondary | ICD-10-CM

## 2014-08-06 DIAGNOSIS — H6121 Impacted cerumen, right ear: Secondary | ICD-10-CM

## 2014-08-06 DIAGNOSIS — M4854XS Collapsed vertebra, not elsewhere classified, thoracic region, sequela of fracture: Secondary | ICD-10-CM | POA: Diagnosis not present

## 2014-08-06 DIAGNOSIS — S22080S Wedge compression fracture of T11-T12 vertebra, sequela: Secondary | ICD-10-CM

## 2014-08-06 MED ORDER — TRAMADOL HCL 50 MG PO TABS: 50.0000 mg | ORAL_TABLET | Freq: Three times a day (TID) | ORAL | Status: DC | PRN

## 2014-08-06 NOTE — Assessment & Plan Note (Signed)
Irrigation performed today. 

## 2014-08-06 NOTE — Progress Notes (Signed)
Pre visit review using our clinic review tool, if applicable. No additional management support is needed unless otherwise documented below in the visit note. 

## 2014-08-06 NOTE — Assessment & Plan Note (Signed)
Discussed with patient. Latest DEXA 2010.

## 2014-08-06 NOTE — Assessment & Plan Note (Addendum)
Reviewed imaging reports from our chart as well as imaging report she brings. Moderate compression fracture T12 unclear if acute or chronic by report. Pain not improving as would be expected. Pt interested in further evaluation and referral to neurosurgery - will order thoracic MRI and refer. In interim, continue calcitonin nasal spray, naprosyn 500mg  bid and tramadol for breakthrough pain. Sedation precautions discussed. Continue back brace use. Pt/daughter agree with plan.

## 2014-08-06 NOTE — Progress Notes (Signed)
BP 154/84 mmHg  Pulse 96  Temp(Src) 98.2 F (36.8 C) (Oral)  Wt 183 lb 8 oz (83.235 kg)   CC: f/u back pain  Subjective:    Patient ID: Jordan Patterson, female    DOB: 1940/09/18, 74 y.o.   MRN: [id number]  HPI: Jordan Patterson is a 74 y.o. female presenting on 08/06/2014 for Back Pain   Seen 07/08/2014 with lower back pain when she slipped off ladder and injured back on 5/13. Xray at that time showed mild T12 compression fracture. Treated with flexeril and aleve. After no significant improvement, referred to orthopedist to discuss kyphoplasty. She decided not to see orthopedist. Has instead been seeing chiropractor. Persistent 8/10 pain with only minimal improvement in last month. Using back brace.  Flexeril ineffective. Naprosyn helps some.  Salmon calcitonin not effective with pain control.   Planning beach trip this Saturday   Known osteopenia.  Also with 1 wk h/o R hearing loss without pain or congestion or fever.  Relevant past medical, surgical, family and social history reviewed and updated as indicated. Interim medical history since our last visit reviewed. Allergies and medications reviewed and updated. Current Outpatient Prescriptions on File Prior to Visit  Medication Sig  . acetaminophen (TYLENOL) 500 MG tablet Take 500 mg by mouth 3 (three) times daily as needed.  Marland Kitchen albuterol (VENTOLIN HFA) 108 (90 BASE) MCG/ACT inhaler Inhale 2 puffs into the lungs every 6 (six) hours as needed for wheezing or shortness of breath.  . calcitonin, salmon, (MIACALCIN/FORTICAL) 200 UNIT/ACT nasal spray Place 1 spray into alternate nostrils daily.  . Coenzyme Q10 (COQ10) 100 MG CAPS Take 1 capsule by mouth daily.  Marland Kitchen EPINEPHrine 0.3 mg/0.3 mL IJ SOAJ injection Inject 0.3 mLs (0.3 mg total) into the muscle once.  . Garlic (GARLIQUE PO) Take 1 tablet by mouth daily.  Marland Kitchen glucose blood test strip ONE TOUCH ULTRA MINI Use as instructed to check sugar 2-3 daily. Dx: 250.00  . levocetirizine (XYZAL)  5 MG tablet Take 1 tablet (5 mg total) by mouth every evening.  Marland Kitchen losartan (COZAAR) 25 MG tablet Take 1 tablet (25 mg total) by mouth daily.  . Magnesium 250 MG TABS Take 250 mg by mouth daily.  . Melatonin 5 MG TABS Take 5 mg by mouth daily.  . metFORMIN (GLUCOPHAGE) 500 MG tablet Take 1 tablet (500 mg total) by mouth daily with breakfast.  . Misc Natural Products (OSTEO BI-FLEX ADV DOUBLE ST PO) Take by mouth 2 (two) times daily.  . montelukast (SINGULAIR) 10 MG tablet Take 1 tablet (10 mg total) by mouth at bedtime.  . Multiple Vitamins-Minerals (MULTIVITAMIN PO) Take 1 tablet by mouth daily.  . Multiple Vitamins-Minerals (VISION VITAMINS) TABS Take by mouth daily.  . naproxen (NAPROSYN) 500 MG tablet Take 1 tablet (500 mg total) by mouth 2 (two) times daily as needed. Take with food  . NON FORMULARY Allergy injections  . omeprazole (PRILOSEC) 40 MG capsule Take 1 capsule (40 mg total) by mouth 2 (two) times daily.  . Polyvinyl Alcohol-Povidone (REFRESH OP) Apply to eye daily.  Marland Kitchen SYNTHROID 88 MCG tablet TAKE 1 TABLET BY MOUTH DAILY  . triamcinolone (NASACORT ALLERGY 24HR) 55 MCG/ACT AERO nasal inhaler Place 2 sprays into the nose as needed.   . triamterene-hydrochlorothiazide (DYAZIDE) 37.5-25 MG per capsule TAKE ONE CAPSULE BY MOUTH EVERY MORNING  . aspirin EC 81 MG tablet Take 81 mg by mouth every Monday, Wednesday, and Friday.   No current facility-administered medications  on file prior to visit.    Review of Systems Per HPI unless specifically indicated above     Objective:    BP 154/84 mmHg  Pulse 96  Temp(Src) 98.2 F (36.8 C) (Oral)  Wt 183 lb 8 oz (83.235 kg)  Wt Readings from Last 3 Encounters:  08/06/14 183 lb 8 oz (83.235 kg)  07/08/14 183 lb 8 oz (83.235 kg)  06/20/14 183 lb 6.4 oz (83.19 kg)    Physical Exam  Constitutional: She appears well-developed and well-nourished. No distress.  HENT:  Right Ear: External ear normal. Decreased hearing is noted.  Left  Ear: Hearing, tympanic membrane, external ear and ear canal normal.  Deep soft cerumen impaction R canal  Musculoskeletal: She exhibits no edema.  Tender to palpation midline at T12 and R paraspinous mm tenderness to palpation.  Nursing note and vitals reviewed.    LUMBAR SPINE - COMPLETE 4+ VIEW COMPARISON: None. FINDINGS: The lumbar vertebral bodies are preserved in height. Mild loss of height anteriorly of T12 is suspected. The lumbar pedicles and transverse processes are intact. There is mild multilevel disc space narrowing. The observed portions of the sacrum are unremarkable. There is no spondylolisthesis. There is facet joint hypertrophy at L4-5 and at L5-S1. IMPRESSION: There is mild anterior wedge compression of T12 with loss of height of approximately 10%. There is mild degenerative facet joint change and disc space narrowing at multiple lumbar levels. Electronically Signed  By: David Swaziland M.D.  On: 07/08/2014 13:22    Assessment & Plan:  Over 25 minutes were spent face-to-face with the patient during this encounter and >50% of that time was spent on counseling and coordination of care Problem List Items Addressed This Visit    Compression fracture of T12 vertebra - Primary    Reviewed imaging reports from our chart as well as imaging report she brings. Moderate compression fracture T12 unclear if acute or chronic by report. Pain not improving as would be expected. Pt interested in further evaluation and referral to neurosurgery - will order thoracic MRI and refer. In interim, continue calcitonin nasal spray, naprosyn  bid and tramadol for breakthrough pain. Sedation precautions discussed. Continue back brace use. Pt/daughter agree with plan.      Relevant Orders   Ambulatory referral to Neurosurgery   MR Thoracic Spine Wo Contrast   Hearing loss of right ear due to cerumen impaction    Irrigation performed today.      Osteopenia    Discussed with patient.  Latest DEXA 2010.           Follow up plan: Return as needed.

## 2014-08-06 NOTE — Patient Instructions (Addendum)
Let's order MRI and refer you to spine doctor for further evaluation - pass by Marion's office for this. Continue naprosyn and try tramadol for breakthrough pain. Continue nasal spray. Ear irrigation on right performed today.

## 2014-08-07 ENCOUNTER — Telehealth: Payer: Self-pay | Admitting: Family Medicine

## 2014-08-07 ENCOUNTER — Ambulatory Visit: Payer: Medicare Other

## 2014-08-07 MED ORDER — DIAZEPAM 5 MG PO TABS: 5.0000 mg | ORAL_TABLET | Freq: Two times a day (BID) | ORAL | Status: DC | PRN

## 2014-08-07 NOTE — Telephone Encounter (Signed)
Rx called in as directed and patient notified via Mychart.  

## 2014-08-07 NOTE — Telephone Encounter (Signed)
pt requested med for MRI - plz phone in. Have someone drive her to and from MRI

## 2014-08-08 ENCOUNTER — Ambulatory Visit (INDEPENDENT_AMBULATORY_CARE_PROVIDER_SITE_OTHER): Payer: Medicare Other

## 2014-08-08 DIAGNOSIS — J309 Allergic rhinitis, unspecified: Secondary | ICD-10-CM | POA: Diagnosis not present

## 2014-08-15 ENCOUNTER — Other Ambulatory Visit: Payer: Self-pay | Admitting: Pulmonary Disease

## 2014-08-15 MED ORDER — MONTELUKAST SODIUM 10 MG PO TABS: 10.0000 mg | ORAL_TABLET | Freq: Every day | ORAL | Status: DC

## 2014-08-16 ENCOUNTER — Encounter: Payer: Self-pay | Admitting: Internal Medicine

## 2014-08-18 ENCOUNTER — Other Ambulatory Visit: Payer: Medicare Other

## 2014-08-19 ENCOUNTER — Other Ambulatory Visit: Payer: Self-pay

## 2014-08-19 ENCOUNTER — Ambulatory Visit: Payer: Medicare Other

## 2014-08-20 ENCOUNTER — Ambulatory Visit (INDEPENDENT_AMBULATORY_CARE_PROVIDER_SITE_OTHER): Payer: Medicare Other

## 2014-08-20 DIAGNOSIS — J309 Allergic rhinitis, unspecified: Secondary | ICD-10-CM

## 2014-08-21 ENCOUNTER — Other Ambulatory Visit: Payer: Medicare Other

## 2014-08-21 ENCOUNTER — Ambulatory Visit
Admission: RE | Admit: 2014-08-21 | Discharge: 2014-08-21 | Disposition: A | Discharge status: Home / Self Care | Payer: Medicare Other | Source: Ambulatory Visit | Attending: Family Medicine | Admitting: Family Medicine

## 2014-08-21 DIAGNOSIS — S22080S Wedge compression fracture of T11-T12 vertebra, sequela: Secondary | ICD-10-CM

## 2014-08-27 ENCOUNTER — Telehealth: Payer: Self-pay | Admitting: Internal Medicine

## 2014-08-27 ENCOUNTER — Ambulatory Visit (INDEPENDENT_AMBULATORY_CARE_PROVIDER_SITE_OTHER): Payer: Medicare Other

## 2014-08-27 DIAGNOSIS — J309 Allergic rhinitis, unspecified: Secondary | ICD-10-CM

## 2014-08-27 NOTE — Telephone Encounter (Signed)
Opened in error

## 2014-08-29 ENCOUNTER — Encounter (HOSPITAL_COMMUNITY): Payer: Self-pay | Admitting: *Deleted

## 2014-08-29 ENCOUNTER — Other Ambulatory Visit: Payer: Self-pay | Admitting: Neurosurgery

## 2014-08-29 NOTE — Progress Notes (Signed)
Pt stated that Dr. Newell CoralNudelman told her to eat a light breakfast at 6 AM tomorrow and to take her BP meds because her "BP was so high today". She states it was 170/90. I asked her if the lower number has ever been over a hundred and she stated no. I talked with Revonda StandardAllison, [place] and she in talked with Dr. Okey Dupreose, anesthesiologist. He was concerned about her taking her BP meds and did not want her taking them. I called her back and gave her this information and explained to her that the Losartan can cause low blood pressure during surgery and the Triamterene/HCTZ can cause dehydration if she is not eating or drinking. She voiced understanding. I also instructed her to come in 3 hours prior to surgery instead of 2.5 hours. I told her that if her BP was high, this would give the anesthesiologist time to help correct it. Again, she voiced understanding.

## 2014-08-30 ENCOUNTER — Observation Stay (HOSPITAL_COMMUNITY): Payer: Medicare Other

## 2014-08-30 ENCOUNTER — Encounter (HOSPITAL_COMMUNITY): Payer: Self-pay | Admitting: *Deleted

## 2014-08-30 ENCOUNTER — Ambulatory Visit (HOSPITAL_COMMUNITY): Payer: Medicare Other | Admitting: Vascular Surgery

## 2014-08-30 ENCOUNTER — Encounter (HOSPITAL_COMMUNITY)
Admission: RE | Disposition: A | Discharge status: Home / Self Care | Payer: Self-pay | Source: Ambulatory Visit | Attending: Neurosurgery

## 2014-08-30 ENCOUNTER — Observation Stay (HOSPITAL_COMMUNITY)
Admission: RE | Admit: 2014-08-30 | Discharge: 2014-08-31 | Disposition: A | Discharge status: Home / Self Care | Payer: Medicare Other | Source: Ambulatory Visit | Attending: Neurosurgery | Admitting: Neurosurgery

## 2014-08-30 DIAGNOSIS — F329 Major depressive disorder, single episode, unspecified: Secondary | ICD-10-CM | POA: Diagnosis not present

## 2014-08-30 DIAGNOSIS — M4854XA Collapsed vertebra, not elsewhere classified, thoracic region, initial encounter for fracture: Secondary | ICD-10-CM | POA: Diagnosis present

## 2014-08-30 DIAGNOSIS — E119 Type 2 diabetes mellitus without complications: Secondary | ICD-10-CM | POA: Diagnosis not present

## 2014-08-30 DIAGNOSIS — H3530 Unspecified macular degeneration: Secondary | ICD-10-CM | POA: Insufficient documentation

## 2014-08-30 DIAGNOSIS — J4520 Mild intermittent asthma, uncomplicated: Secondary | ICD-10-CM | POA: Diagnosis not present

## 2014-08-30 DIAGNOSIS — E785 Hyperlipidemia, unspecified: Secondary | ICD-10-CM | POA: Insufficient documentation

## 2014-08-30 DIAGNOSIS — Z8249 Family history of ischemic heart disease and other diseases of the circulatory system: Secondary | ICD-10-CM | POA: Insufficient documentation

## 2014-08-30 DIAGNOSIS — E039 Hypothyroidism, unspecified: Secondary | ICD-10-CM | POA: Diagnosis not present

## 2014-08-30 DIAGNOSIS — W19XXXA Unspecified fall, initial encounter: Secondary | ICD-10-CM | POA: Diagnosis not present

## 2014-08-30 DIAGNOSIS — K219 Gastro-esophageal reflux disease without esophagitis: Secondary | ICD-10-CM | POA: Insufficient documentation

## 2014-08-30 DIAGNOSIS — I10 Essential (primary) hypertension: Secondary | ICD-10-CM | POA: Diagnosis not present

## 2014-08-30 DIAGNOSIS — S22000A Wedge compression fracture of unspecified thoracic vertebra, initial encounter for closed fracture: Secondary | ICD-10-CM

## 2014-08-30 HISTORY — PX: KYPHOPLASTY: SHX5884

## 2014-08-30 LAB — BASIC METABOLIC PANEL
Anion gap: 12 (ref 5–15)
BUN: 16 mg/dL (ref 6–20)
CO2: 24 mmol/L (ref 22–32)
Calcium: 9.4 mg/dL (ref 8.9–10.3)
Chloride: 104 mmol/L (ref 101–111)
Creatinine, Ser: 0.74 mg/dL (ref 0.44–1.00)
GFR calc Af Amer: >60 mL/min (ref >60)
GFR calc non Af Amer: >60 mL/min (ref >60)
Glucose, Bld: 108 mg/dL — ABNORMAL HIGH (ref 65–99)
Potassium: 4.1 mmol/L (ref 3.5–5.1)
Sodium: 140 mmol/L (ref 135–145)

## 2014-08-30 LAB — CBC
HCT: 39.1 % (ref 36.0–46.0)
Hemoglobin: 13.4 g/dL (ref 12.0–15.0)
MCH: 31.1 pg (ref 26.0–34.0)
MCHC: 34.3 g/dL (ref 30.0–36.0)
MCV: 90.7 fL (ref 78.0–100.0)
Platelets: 170 10*3/uL (ref 150–400)
RBC: 4.31 MIL/uL (ref 3.87–5.11)
RDW: 13.4 % (ref 11.5–15.5)
WBC: 6.1 10*3/uL (ref 4.0–10.5)

## 2014-08-30 LAB — SURGICAL PCR SCREEN
MRSA, PCR: NEGATIVE
Staphylococcus aureus: NEGATIVE

## 2014-08-30 LAB — GLUCOSE, CAPILLARY
Glucose-Capillary: 97 mg/dL (ref 65–99)
Glucose-Capillary: 123 mg/dL — ABNORMAL HIGH (ref 65–99)

## 2014-08-30 SURGERY — KYPHOPLASTY
Anesthesia: General | Site: Spine Thoracic

## 2014-08-30 MED ORDER — MENTHOL 3 MG MT LOZG: 1.0000 | LOZENGE | OROMUCOSAL | Status: DC | PRN

## 2014-08-30 MED ORDER — ACETAMINOPHEN 10 MG/ML IV SOLN
INTRAVENOUS | Status: AC
  Administered 2014-08-30: 1000 mg via INTRAVENOUS
  Filled 2014-08-30: qty 100

## 2014-08-30 MED ORDER — CEFAZOLIN SODIUM-DEXTROSE 2-3 GM-% IV SOLR
INTRAVENOUS | Status: AC
  Filled 2014-08-30: qty 50

## 2014-08-30 MED ORDER — HYDROCODONE-ACETAMINOPHEN 5-325 MG PO TABS: 1.0000 | ORAL_TABLET | ORAL | Status: DC | PRN

## 2014-08-30 MED ORDER — ROCURONIUM BROMIDE 100 MG/10ML IV SOLN
INTRAVENOUS | Status: DC | PRN
  Administered 2014-08-30: 50 mg via INTRAVENOUS

## 2014-08-30 MED ORDER — ACETAMINOPHEN 325 MG PO TABS: 650.0000 mg | ORAL_TABLET | ORAL | Status: DC | PRN

## 2014-08-30 MED ORDER — SODIUM CHLORIDE 0.9 % IJ SOLN: 3.0000 mL | INTRAMUSCULAR | Status: DC | PRN

## 2014-08-30 MED ORDER — MUPIROCIN 2 % EX OINT
TOPICAL_OINTMENT | CUTANEOUS | Status: AC
  Filled 2014-08-30: qty 22

## 2014-08-30 MED ORDER — TRIAMCINOLONE ACETONIDE 55 MCG/ACT NA AERO
1.0000 | INHALATION_SPRAY | Freq: Every day | NASAL | Status: DC | PRN
  Filled 2014-08-30: qty 10.8

## 2014-08-30 MED ORDER — ALBUTEROL SULFATE (2.5 MG/3ML) 0.083% IN NEBU: 2.5000 mg | INHALATION_SOLUTION | Freq: Four times a day (QID) | RESPIRATORY_TRACT | Status: DC | PRN

## 2014-08-30 MED ORDER — MAGNESIUM HYDROXIDE 400 MG/5ML PO SUSP: 30.0000 mL | Freq: Every day | ORAL | Status: DC | PRN

## 2014-08-30 MED ORDER — FENTANYL CITRATE (PF) 100 MCG/2ML IJ SOLN
25.0000 ug | INTRAMUSCULAR | Status: DC | PRN
Start: 2014-08-30 — End: 2014-08-31

## 2014-08-30 MED ORDER — ONDANSETRON HCL 4 MG PO TABS: 4.0000 mg | ORAL_TABLET | Freq: Four times a day (QID) | ORAL | Status: DC | PRN

## 2014-08-30 MED ORDER — CEFAZOLIN SODIUM-DEXTROSE 2-3 GM-% IV SOLR
2.0000 g | INTRAVENOUS | Status: AC
  Administered 2014-08-30: 2 g via INTRAVENOUS

## 2014-08-30 MED ORDER — LIDOCAINE-EPINEPHRINE 1 %-1:100000 IJ SOLN
INTRAMUSCULAR | Status: DC | PRN
  Administered 2014-08-30: 7.5 mL

## 2014-08-30 MED ORDER — MEPERIDINE HCL 25 MG/ML IJ SOLN
6.2500 mg | INTRAMUSCULAR | Status: DC | PRN
Start: 2014-08-30 — End: 2014-08-31

## 2014-08-30 MED ORDER — LACTATED RINGERS IV SOLN
INTRAVENOUS | Status: DC | PRN
  Administered 2014-08-30: 18:00:00 via INTRAVENOUS

## 2014-08-30 MED ORDER — FENTANYL CITRATE (PF) 250 MCG/5ML IJ SOLN
INTRAMUSCULAR | Status: AC
  Filled 2014-08-30: qty 5

## 2014-08-30 MED ORDER — BISACODYL 10 MG RE SUPP: 10.0000 mg | Freq: Every day | RECTAL | Status: DC | PRN

## 2014-08-30 MED ORDER — LEVOCETIRIZINE DIHYDROCHLORIDE 5 MG PO TABS: 5.0000 mg | ORAL_TABLET | Freq: Every evening | ORAL | Status: DC

## 2014-08-30 MED ORDER — ALUM & MAG HYDROXIDE-SIMETH 200-200-20 MG/5ML PO SUSP: 30.0000 mL | Freq: Four times a day (QID) | ORAL | Status: DC | PRN

## 2014-08-30 MED ORDER — FENTANYL CITRATE (PF) 100 MCG/2ML IJ SOLN
INTRAMUSCULAR | Status: DC | PRN
  Administered 2014-08-30 (×2): 100 ug via INTRAVENOUS
  Administered 2014-08-30: 50 ug via INTRAVENOUS

## 2014-08-30 MED ORDER — ONDANSETRON HCL 4 MG/2ML IJ SOLN
INTRAMUSCULAR | Status: AC
  Filled 2014-08-30: qty 2

## 2014-08-30 MED ORDER — OXYCODONE-ACETAMINOPHEN 5-325 MG PO TABS: 1.0000 | ORAL_TABLET | ORAL | Status: DC | PRN

## 2014-08-30 MED ORDER — MEPERIDINE HCL 25 MG/ML IJ SOLN: 6.2500 mg | INTRAMUSCULAR | Status: DC | PRN

## 2014-08-30 MED ORDER — KCL IN DEXTROSE-NACL 20-5-0.45 MEQ/L-%-% IV SOLN
INTRAVENOUS | Status: DC
  Filled 2014-08-30 (×3): qty 1000

## 2014-08-30 MED ORDER — METFORMIN HCL 500 MG PO TABS
500.0000 mg | ORAL_TABLET | Freq: Every day | ORAL | Status: DC
  Administered 2014-08-31: 500 mg via ORAL
  Filled 2014-08-30 (×2): qty 1

## 2014-08-30 MED ORDER — GLYCOPYRROLATE 0.2 MG/ML IJ SOLN
INTRAMUSCULAR | Status: DC | PRN
  Administered 2014-08-30: .8 mg via INTRAVENOUS

## 2014-08-30 MED ORDER — HYDROXYZINE HCL 25 MG PO TABS: 50.0000 mg | ORAL_TABLET | ORAL | Status: DC | PRN

## 2014-08-30 MED ORDER — SODIUM CHLORIDE 0.9 % IJ SOLN: 3.0000 mL | Freq: Two times a day (BID) | INTRAMUSCULAR | Status: DC

## 2014-08-30 MED ORDER — PROPOFOL 10 MG/ML IV BOLUS
INTRAVENOUS | Status: DC | PRN
Start: 2014-08-30 — End: 2014-08-30
  Administered 2014-08-30: 200 mg via INTRAVENOUS

## 2014-08-30 MED ORDER — SODIUM CHLORIDE 0.9 % IV SOLN: 250.0000 mL | INTRAVENOUS | Status: DC

## 2014-08-30 MED ORDER — LOSARTAN POTASSIUM 25 MG PO TABS
25.0000 mg | ORAL_TABLET | Freq: Every day | ORAL | Status: DC
  Filled 2014-08-30 (×2): qty 1

## 2014-08-30 MED ORDER — TRIAMTERENE-HCTZ 37.5-25 MG PO CAPS
1.0000 | ORAL_CAPSULE | Freq: Every morning | ORAL | Status: DC
  Filled 2014-08-30: qty 1

## 2014-08-30 MED ORDER — DIAZEPAM 5 MG PO TABS: 5.0000 mg | ORAL_TABLET | Freq: Two times a day (BID) | ORAL | Status: DC | PRN

## 2014-08-30 MED ORDER — ACETAMINOPHEN 650 MG RE SUPP: 650.0000 mg | RECTAL | Status: DC | PRN

## 2014-08-30 MED ORDER — CALCITONIN (SALMON) 200 UNIT/ACT NA SOLN
1.0000 | Freq: Every day | NASAL | Status: DC
  Filled 2014-08-30: qty 3.7

## 2014-08-30 MED ORDER — HYDROXYZINE HCL 50 MG/ML IM SOLN
50.0000 mg | INTRAMUSCULAR | Status: DC | PRN
  Filled 2014-08-30: qty 1

## 2014-08-30 MED ORDER — PHENYLEPHRINE HCL 10 MG/ML IJ SOLN
INTRAMUSCULAR | Status: DC | PRN
  Administered 2014-08-30: 80 ug via INTRAVENOUS

## 2014-08-30 MED ORDER — LIDOCAINE HCL (CARDIAC) 20 MG/ML IV SOLN
INTRAVENOUS | Status: DC | PRN
  Administered 2014-08-30: 80 mg via INTRAVENOUS

## 2014-08-30 MED ORDER — PROMETHAZINE HCL 25 MG/ML IJ SOLN: 6.2500 mg | INTRAMUSCULAR | Status: DC | PRN

## 2014-08-30 MED ORDER — NEOSTIGMINE METHYLSULFATE 10 MG/10ML IV SOLN
INTRAVENOUS | Status: DC | PRN
  Administered 2014-08-30: 5 mg via INTRAVENOUS

## 2014-08-30 MED ORDER — KETOROLAC TROMETHAMINE 30 MG/ML IJ SOLN
15.0000 mg | Freq: Four times a day (QID) | INTRAMUSCULAR | Status: DC
  Administered 2014-08-31 (×2): 15 mg via INTRAVENOUS
  Filled 2014-08-30 (×6): qty 1

## 2014-08-30 MED ORDER — PROPOFOL 10 MG/ML IV BOLUS
INTRAVENOUS | Status: AC
  Filled 2014-08-30: qty 20

## 2014-08-30 MED ORDER — ONDANSETRON HCL 4 MG/2ML IJ SOLN
INTRAMUSCULAR | Status: DC | PRN
  Administered 2014-08-30: 4 mg via INTRAVENOUS

## 2014-08-30 MED ORDER — PROMETHAZINE HCL 25 MG/ML IJ SOLN
6.2500 mg | INTRAMUSCULAR | Status: DC | PRN
Start: 2014-08-30 — End: 2014-08-30

## 2014-08-30 MED ORDER — LEVOTHYROXINE SODIUM 88 MCG PO TABS
88.0000 ug | ORAL_TABLET | Freq: Every day | ORAL | Status: DC
  Administered 2014-08-31: 88 ug via ORAL
  Filled 2014-08-30 (×2): qty 1

## 2014-08-30 MED ORDER — ALBUTEROL SULFATE HFA 108 (90 BASE) MCG/ACT IN AERS: 2.0000 | INHALATION_SPRAY | Freq: Four times a day (QID) | RESPIRATORY_TRACT | Status: DC | PRN

## 2014-08-30 MED ORDER — LACTATED RINGERS IV SOLN
INTRAVENOUS | Status: DC
  Administered 2014-08-30: 50 mL/h via INTRAVENOUS

## 2014-08-30 MED ORDER — KETOROLAC TROMETHAMINE 30 MG/ML IJ SOLN
15.0000 mg | Freq: Once | INTRAMUSCULAR | Status: AC
  Administered 2014-08-30: 15 mg via INTRAVENOUS
  Filled 2014-08-30: qty 1

## 2014-08-30 MED ORDER — BUPIVACAINE HCL (PF) 0.25 % IJ SOLN
INTRAMUSCULAR | Status: DC | PRN
  Administered 2014-08-30: 7.5 mL

## 2014-08-30 MED ORDER — MUPIROCIN 2 % EX OINT
1.0000 "application " | TOPICAL_OINTMENT | Freq: Once | CUTANEOUS | Status: AC
  Administered 2014-08-30: 1 via TOPICAL

## 2014-08-30 MED ORDER — CYCLOBENZAPRINE HCL 10 MG PO TABS
10.0000 mg | ORAL_TABLET | Freq: Three times a day (TID) | ORAL | Status: DC | PRN
  Administered 2014-08-30: 10 mg via ORAL
  Filled 2014-08-30: qty 1

## 2014-08-30 MED ORDER — PHENOL 1.4 % MT LIQD: 1.0000 | OROMUCOSAL | Status: DC | PRN

## 2014-08-30 MED ORDER — ONDANSETRON HCL 4 MG/2ML IJ SOLN: 4.0000 mg | Freq: Four times a day (QID) | INTRAMUSCULAR | Status: DC | PRN

## 2014-08-30 MED ORDER — LORATADINE 10 MG PO TABS
5.0000 mg | ORAL_TABLET | Freq: Every evening | ORAL | Status: DC
  Filled 2014-08-30 (×2): qty 0.5

## 2014-08-30 MED ORDER — MORPHINE SULFATE 4 MG/ML IJ SOLN: 4.0000 mg | INTRAMUSCULAR | Status: DC | PRN

## 2014-08-30 MED ORDER — HYDROMORPHONE HCL 1 MG/ML IJ SOLN: 0.2500 mg | INTRAMUSCULAR | Status: DC | PRN

## 2014-08-30 MED ORDER — MONTELUKAST SODIUM 10 MG PO TABS
10.0000 mg | ORAL_TABLET | Freq: Every day | ORAL | Status: DC
  Administered 2014-08-30: 10 mg via ORAL
  Filled 2014-08-30 (×2): qty 1

## 2014-08-30 SURGICAL SUPPLY — 43 items
ADH SKN CLS APL DERMABOND .7 (GAUZE/BANDAGES/DRESSINGS) ×1
BANDAGE ADH SHEER 1  50/CT (GAUZE/BANDAGES/DRESSINGS) ×5 IMPLANT
BLADE CLIPPER SURG (BLADE) IMPLANT
BLADE SURG 11 STRL SS (BLADE) ×2 IMPLANT
CEMENT BONE KYPHX HV R (Orthopedic Implant) ×1 IMPLANT
CEMENT KYPHON C01A KIT/MIXER (Cement) ×1 IMPLANT
CONT SPEC 4OZ CLIKSEAL STRL BL (MISCELLANEOUS) ×3 IMPLANT
DECANTER SPIKE VIAL GLASS SM (MISCELLANEOUS) ×2 IMPLANT
DERMABOND ADVANCED (GAUZE/BANDAGES/DRESSINGS) ×1
DERMABOND ADVANCED .7 DNX12 (GAUZE/BANDAGES/DRESSINGS) IMPLANT
DRAPE C-ARM 42X72 X-RAY (DRAPES) ×2 IMPLANT
DRAPE INCISE IOBAN 66X45 STRL (DRAPES) ×2 IMPLANT
DRAPE LAPAROTOMY 100X72X124 (DRAPES) ×2 IMPLANT
DRAPE PROXIMA HALF (DRAPES) ×2 IMPLANT
DURAPREP 26ML APPLICATOR (WOUND CARE) ×2 IMPLANT
GAUZE SPONGE 4X4 16PLY XRAY LF (GAUZE/BANDAGES/DRESSINGS) ×2 IMPLANT
GLOVE BIOGEL PI IND STRL 8 (GLOVE) ×1 IMPLANT
GLOVE BIOGEL PI INDICATOR 8 (GLOVE) ×2
GLOVE ECLIPSE 7.5 STRL STRAW (GLOVE) ×3 IMPLANT
GLOVE EXAM NITRILE LRG STRL (GLOVE) ×1 IMPLANT
GLOVE EXAM NITRILE MD LF STRL (GLOVE) ×1 IMPLANT
GLOVE EXAM NITRILE XL STR (GLOVE) IMPLANT
GLOVE EXAM NITRILE XS STR PU (GLOVE) IMPLANT
GOWN STRL REUS W/ TWL LRG LVL3 (GOWN DISPOSABLE) IMPLANT
GOWN STRL REUS W/ TWL XL LVL3 (GOWN DISPOSABLE) IMPLANT
GOWN STRL REUS W/TWL 2XL LVL3 (GOWN DISPOSABLE) IMPLANT
GOWN STRL REUS W/TWL LRG LVL3 (GOWN DISPOSABLE)
GOWN STRL REUS W/TWL XL LVL3 (GOWN DISPOSABLE)
KIT BASIN OR (CUSTOM PROCEDURE TRAY) ×2 IMPLANT
KIT ROOM TURNOVER OR (KITS) ×2 IMPLANT
NDL HYPO 25X1 1.5 SAFETY (NEEDLE) ×1 IMPLANT
NEEDLE HYPO 25X1 1.5 SAFETY (NEEDLE) ×2 IMPLANT
NS IRRIG 1000ML POUR BTL (IV SOLUTION) ×2 IMPLANT
PACK SURGICAL SETUP 50X90 (CUSTOM PROCEDURE TRAY) ×2 IMPLANT
PAD ARMBOARD 7.5X6 YLW CONV (MISCELLANEOUS) ×7 IMPLANT
SPECIMEN JAR SMALL (MISCELLANEOUS) IMPLANT
SUT VIC AB 3-0 SH 8-18 (SUTURE) ×2 IMPLANT
SUT VIC AB 4-0 P-3 18X BRD (SUTURE) ×1 IMPLANT
SUT VIC AB 4-0 P3 18 (SUTURE) ×2
SYR CONTROL 10ML LL (SYRINGE) ×4 IMPLANT
TOWEL OR 17X24 6PK STRL BLUE (TOWEL DISPOSABLE) ×2 IMPLANT
TOWEL OR 17X26 10 PK STRL BLUE (TOWEL DISPOSABLE) ×2 IMPLANT
TRAY KYPHOPAK 15/3 ONESTEP 1ST (MISCELLANEOUS) ×1 IMPLANT

## 2014-08-30 NOTE — H&P (Signed)
Subjective: Patient is a 74 y.o. right-handed white female who is admitted for treatment of T12 compression fracture.  Patient fell nearly 2 months ago and has had persistent pain in the mid to upper lumbar spine. She had x-ray 3 days after she injured herself which showed a T12 compression fracture. She was prescribed naproxen and muscle relaxants, but did use ibuprofen. She's had persistent pain and went to see her chiropractor who recommended an MRI. This confirmed the compression fracture. Subsequent x-ray shows progressive loss of height of the T12 vertebra. At this point she has disabling pain, he can stand for no more than 5 minutes. The pain is somewhat relieved by sitting, she can lie on her left side, but cannot lay flat on her back because of pain. She is admitted now for a T12 kyphoplasty.    Patient Active Problem List   Diagnosis Date Noted  . Hearing loss of right ear due to cerumen impaction 08/06/2014  . Compression fracture of T12 vertebra 07/18/2014  . Fall 07/08/2014  . Right knee pain 05/30/2014  . Advanced care planning/counseling discussion 01/24/2014  . Health maintenance examination 01/24/2014  . Dysgeusia 11/12/2013  . Cough 03/19/2013  . Fatigue 06/14/2012  . Allergic conjunctivitis and rhinitis 05/15/2012  . Asthma, mild intermittent, well-controlled 03/09/2012  . Medicare annual wellness visit, subsequent 01/14/2012  . Hiatal hernia   . GERD (gastroesophageal reflux disease)   . Hypertension   . Hyperlipidemia   . Depression   . Hypothyroidism   . Osteopenia   . Diabetes type 2, controlled   . Arthritis    Past Medical History  Diagnosis Date  . Hiatal hernia   . GERD (gastroesophageal reflux disease)     [name] EGD - mild esophageal dysmotility and small hiatal hernia  . Hypertension   . Extrinsic asthma   . Hyperlipidemia     mild, diet controlled  . Hypothyroidism   . Osteopenia   . Diabetes type 2, controlled 2004    diet controlled  .  Macular degeneration   . Arthritis     hands and knees - ?osteo  . Environmental allergies     dust,mold,mildew  . Hx of migraines   . Chronic headaches   . Allergic rhinitis   . Depression     pt denies    Past Surgical History  Procedure Laterality Date  . Dilation and curettage of uterus  1978  . Tonsillectomy and adenoidectomy  1962  . Laparoscopy abdomen diagnostic      To R/O endometriosis  . Esophagogastroduodenoscopy  06/25/2011    Procedure: ESOPHAGOGASTRODUODENOSCOPY (EGD);  Surgeon: Hart Carwin, MD;  Location: [facility];  Service: Endoscopy;  Laterality: N/A;  no Xray  . Savory dilation  06/25/2011    Procedure: SAVORY DILATION;  Surgeon: Hart Carwin, MD;  Location: [facility];  Service: Endoscopy;  Laterality: N/A;  . Cataract extraction  2003    bilateral with lens implant  . Dexa  05/2008    improved (initial DEXA 2007 mild osteopenia)  . Colonoscopy      Prescriptions prior to admission  Medication Sig Dispense Refill Last Dose  . albuterol (VENTOLIN HFA) 108 (90 BASE) MCG/ACT inhaler Inhale 2 puffs into the lungs every 6 (six) hours as needed for wheezing or shortness of breath. 1 Inhaler 5 2 months ago  . calcitonin, salmon, (MIACALCIN/FORTICAL) 200 UNIT/ACT nasal spray Place 1 spray into alternate nostrils daily. 3.7 mL 11 08/30/2014 at 530  . Coenzyme Q10 (  COQ10) 100 MG CAPS Take 100 mg by mouth daily.    08/29/2014 at Unknown time  . diazepam (VALIUM) 5 MG tablet Take 1 tablet (5 mg total) by mouth every 12 (twelve) hours as needed for anxiety. (Patient taking differently: Take 5 mg by mouth See admin instructions. Take one tablet (5 mg) prior to MRI for anxiety) 4 tablet 0 08/21/2014  . EPINEPHrine 0.3 mg/0.3 mL IJ SOAJ injection Inject 0.3 mLs (0.3 mg total) into the muscle once. (Patient taking differently: Inject 0.3 mg into the muscle as needed (allergic reaction). ) 1 Device 1 never used  . Garlic (GARLIQUE PO) Take 1 tablet by mouth daily.   08/29/2014 at  Unknown time  . Glucosamine-Chondroitin (OSTEO BI-FLEX REGULAR STRENGTH PO) Take 2 tablets by mouth daily with supper.   08/29/2014 at Unknown time  . glucose blood test strip ONE TOUCH ULTRA MINI Use as instructed to check sugar 2-3 daily. Dx: 250.00 100 each 3 08/29/2014 at Unknown time  . levocetirizine (XYZAL) 5 MG tablet Take 1 tablet (5 mg total) by mouth every evening. (Patient taking differently: Take 5 mg by mouth at bedtime. ) 30 tablet 5 08/29/2014 at Unknown time  . losartan (COZAAR) 25 MG tablet Take 1 tablet (25 mg total) by mouth daily. 30 tablet 6 08/29/2014 at Unknown time  . Magnesium 250 MG TABS Take 250 mg by mouth daily.   08/29/2014 at Unknown time  . Melatonin 5 MG TABS Take 5 mg by mouth at bedtime.    08/29/2014 at Unknown time  . metFORMIN (GLUCOPHAGE) 500 MG tablet Take 1 tablet (500 mg total) by mouth daily with breakfast. 90 tablet 3 08/30/2014 at Unknown time  . montelukast (SINGULAIR) 10 MG tablet Take 1 tablet (10 mg total) by mouth at bedtime. 30 tablet 4 08/29/2014 at Unknown time  . Multiple Vitamin (MULTIVITAMIN WITH MINERALS) TABS tablet Take 1 tablet by mouth daily.   08/29/2014 at Unknown time  . Multiple Vitamins-Minerals (EYE VITAMINS) CAPS Take 1 capsule by mouth daily.   08/29/2014 at Unknown time  . naproxen sodium (ANAPROX) 220 MG tablet Take 440 mg by mouth at bedtime.   08/29/2014 at Unknown time  . NON FORMULARY Allergy injections   08/27/2014  . omeprazole (PRILOSEC) 40 MG capsule Take 1 capsule (40 mg total) by mouth 2 (two) times daily. 60 capsule 11 08/30/2014 at am  . Polyvinyl Alcohol-Povidone (REFRESH OP) Place 1 drop into both eyes daily.    08/30/2014 at am  . SYNTHROID 88 MCG tablet TAKE 1 TABLET BY MOUTH DAILY 30 tablet 6 08/30/2014 at Unknown time  . traMADol (ULTRAM) 50 MG tablet Take 1 tablet (50 mg total) by mouth every 8 (eight) hours as needed. (Patient taking differently: Take 50 mg by mouth daily. ) 50 tablet 0 08/29/2014 at Unknown time  . triamcinolone (NASACORT  ALLERGY 24HR) 55 MCG/ACT AERO nasal inhaler Place 1 spray into both nostrils daily as needed (congestion).    3 months ago  . triamterene-hydrochlorothiazide (DYAZIDE) 37.5-25 MG per capsule TAKE ONE CAPSULE BY MOUTH EVERY MORNING 30 capsule 6 08/29/2014 at Unknown time  . naproxen (NAPROSYN) 500 MG tablet Take 1 tablet (500 mg total) by mouth 2 (two) times daily as needed. Take with food (Patient not taking: Reported on 08/30/2014) 40 tablet 0 Not Taking at Unknown time   Allergies  Allergen Reactions  . Ace Inhibitors Cough  . Amlodipine Cough  . Crestor [Rosuvastatin] Other (See Comments)    myalgias  .  Pregabalin Other (See Comments)    Unknown reaction  . Tegretol [Carbamazepine] Other (See Comments)    Dizziness, headache  . Wellbutrin [Bupropion Hcl] Other (See Comments)    Unknown reaction  . Cymbalta [Duloxetine Hcl] Palpitations and Other (See Comments)    headaches    History  Substance Use Topics  . Smoking status: Never Smoker   . Smokeless tobacco: Never Used  . Alcohol Use: No    Family History  Problem Relation Age of Onset  . Mitral valve prolapse Father   . Mitral valve prolapse Mother   . Hypertension Mother   . Diabetes Father   . Stroke Mother 2575    after valve surgery  . Stroke Paternal Grandfather   . Coronary artery disease Maternal Grandfather   . Cancer Neg Hx      Review of Systems A comprehensive review of systems was negative.  Objective: Vital signs in last 24 hours: Temp:  [98.7 F (37.1 C)] 98.7 F (37.1 C) (07/08 1607) Pulse Rate:  [90] 90 (07/08 1607) Resp:  [18] 18 (07/08 1607) BP: (195)/(87) 195/87 mmHg (07/08 1607) SpO2:  [98 %] 98 % (07/08 1607) Weight:  [82.555 kg (182 lb)] 82.555 kg (182 lb) (07/08 1607)  EXAM: Patient well-developed well-nourished white female in no acute distress. Lungs are clear to auscultation , the patient has symmetrical respiratory excursion. Heart has a regular rate and rhythm normal S1 and S2 no murmur.    Abdomen is soft nontender nondistended bowel sounds are present. Extremity examination shows no clubbing cyanosis or edema. Motor examination shows 5 over 5 strength in the lower extremities including the iliopsoas quadriceps dorsiflexor extensor hallicus  longus and plantar flexor bilaterally. Sensation is intact to pinprick in the distal lower extremities. Reflexes are symmetrical bilaterally. No pathologic reflexes are present. Patient has a normal gait and stance.   Data Review:CBC    Component Value Date/Time   WBC 6.1 08/30/2014 1613   RBC 4.31 08/30/2014 1613   HGB 13.4 08/30/2014 1613   HCT 39.1 08/30/2014 1613   PLT 170 08/30/2014 1613   MCV 90.7 08/30/2014 1613   MCH 31.1 08/30/2014 1613   MCHC 34.3 08/30/2014 1613   RDW 13.4 08/30/2014 1613   LYMPHSABS 1.9 07/19/2013 1052   MONOABS 0.3 07/19/2013 1052   EOSABS 0.1 07/19/2013 1052   BASOSABS 0.0 07/19/2013 1052                          BMET    Component Value Date/Time   NA 137 05/23/2014 0849   K 4.5 05/23/2014 0849   CL 101 05/23/2014 0849   CO2 29 05/23/2014 0849   GLUCOSE 141* 05/23/2014 0849   BUN 16 05/23/2014 0849   CREATININE 0.91 05/23/2014 0849   CREATININE 0.96 04/21/2011   CALCIUM 9.4 05/23/2014 0849     Assessment/Plan: T12 compression fracture with progressive loss of vertebral body height and persistent disabling pain who is admitted now for a T12 kyphoplasty. I've discussed with the patient the nature of his condition, the nature the surgical procedure, the typical length of surgery, hospital stay, and overall recuperation. We discussed limitations postoperatively. I discussed risks of surgery including risks of infection, bleeding, possibly need for transfusion, the risk of nerve root dysfunction with pain, weakness, numbness, or paresthesias, the risk of dural tear and CSF leakage and possible need for further surgery, and the risk of anesthetic complications including myocardial infarction, stroke,  pneumonia,  and death. Understanding all this the patient does wish to proceed with surgery and is admitted for such.   Hewitt Shorts, MD 08/30/2014 4:50 PM

## 2014-08-30 NOTE — Anesthesia Preprocedure Evaluation (Addendum)
Anesthesia Evaluation  Patient identified by MRN, date of birth, ID band Patient awake    Reviewed: Allergy & Precautions, NPO status , Patient's Chart, lab work & pertinent test results, reviewed documented beta blocker date and time   Airway Mallampati: II  TM Distance: >3 FB Neck ROM: Full    Dental no notable dental hx. (+) Teeth Intact, Dental Advisory Given   Pulmonary asthma (inhalers, PFT 2009 FVC FEV1-25 67%) ,  breath sounds clear to auscultation  Pulmonary exam normal       Cardiovascular hypertension, Pt. on medications Normal cardiovascular examRhythm:Regular Rate:Normal     Neuro/Psych  Headaches, Depression    GI/Hepatic hiatal hernia, GERD-  Medicated,  Endo/Other  diabetes, Type 2Hypothyroidism   Renal/GU      Musculoskeletal  (+) Arthritis -,   Abdominal (+)  Abdomen: soft.    Peds  Hematology   Anesthesia Other Findings   Reproductive/Obstetrics                        Anesthesia Physical Anesthesia Plan  ASA: III  Anesthesia Plan: General   Post-op Pain Management:    Induction: Intravenous  Airway Management Planned: Oral ETT  Additional Equipment:   Intra-op Plan:   Post-operative Plan: Extubation in OR  Informed Consent: I have reviewed the patients History and Physical, chart, labs and discussed the procedure including the risks, benefits and alternatives for the proposed anesthesia with the patient or authorized representative who has indicated his/her understanding and acceptance.   Dental advisory given  Plan Discussed with: CRNA, Anesthesiologist and Surgeon  Anesthesia Plan Comments:       Anesthesia Quick Evaluation

## 2014-08-30 NOTE — Anesthesia Postprocedure Evaluation (Signed)
  Anesthesia Post-op Note  Patient: Jordan HorsemanJean C Bjorkman  Procedure(s) Performed: Procedure(s): THORACIC TWELVE KYPHOPLASTY (N/A)  Patient Location: PACU  Anesthesia Type: General   Level of Consciousness: awake, alert  and oriented  Airway and Oxygen Therapy: Patient Spontanous Breathing  Post-op Pain: mild  Post-op Assessment: Post-op Vital signs reviewed  Post-op Vital Signs: Reviewed  Last Vitals:  Filed Vitals:   08/30/14 2004  BP:   Pulse: 88  Temp: 36.5 C  Resp: 12    Complications: No apparent anesthesia complications

## 2014-08-30 NOTE — Op Note (Signed)
08/30/2014  7:16 PM  PATIENT:  Jordan Patterson  74 y.o. female  PRE-OPERATIVE DIAGNOSIS:  T12 compression fracture  POST-OPERATIVE DIAGNOSIS:  T12 compression fracture  PROCEDURE:  Procedure(s): THORACIC TWELVE KYPHOPLASTY  SURGEON:  Surgeon(s): Shirlean Kellyobert Nudelman, MD  ANESTHESIA:   general  EBL:  Nil  BLOOD ADMINISTERED:none  COUNT: Correct per nursing staff  DICTATION: Patient was brought to the operating room, placed under general endotracheal anesthesia. AP and lateral C-arm fluoroscopy units were set up, and the T12 vertebra identified. The thoracolumbar region posteriorly was prepped with DuraPrep, and draped in a sterile fashion. The C-arm fluoroscopy units were also draped in a sterile fashion. The entry points to approach the posterior lateral aspect of the T12 pedicles were identified bilaterally.  The skin and subcutaneous tissue were infiltrated with local anesthetic with epinephrine. 3 mm incisions were made on either side at the entry points. The cannulated trocar was passed down to the posterior lateral entry point of the pedicles, on each side. Each trocar was passed through the pedicle with C-arm fluoroscopic guidance into the vertebral body on either side. We then removed the inner trocar, leaving the outer cannula. Using the drill we created a passage through the vertebral body, to the ventral wall. The balloon expanders were placed in the vertebral body on either side, and gently inflated and expanded under C-arm fluoroscopic guidance. Once good compaction was achieved bilaterally, the balloon expanders were deflated, and we began to fill the cavities that had been created with methylmethacrylate. We filled from one side to the other, using C-arm fluoroscopic guidance, until good filling of the cavity, and good interdigitation with the bone was achieved bilaterally. A total of 3.25 cc of methylmethacrylate was introduced on each side, for a total of 6.5 cc. We then carefully  removed each of the cannulas, using C-arm for guidance. Final imaging showed good filling of the cavity, with no extravasation. Each of the incisions was closed with a single 3-0 undyed Vicryl subcuticular suture, and the Dermabond. Following surgery the patient was turned back to a supine position, to be reversed and the anesthetic, extubated, and transferred to the recovery room for further care.   PLAN OF CARE: Admit for overnight observation  PATIENT DISPOSITION:  PACU - hemodynamically stable.   Delay start of Pharmacological VTE agent (>24hrs) due to surgical blood loss or risk of bleeding:  yes

## 2014-08-30 NOTE — Anesthesia Procedure Notes (Signed)
Procedure Name: Intubation Date/Time: 08/30/2014 6:25 PM Performed by: Gavin PoundLOWDER, Zarah Carbon J Pre-anesthesia Checklist: Patient identified, Timeout performed, Emergency Drugs available, Suction available and Patient being monitored Patient Re-evaluated:Patient Re-evaluated prior to inductionOxygen Delivery Method: Circle system utilized Preoxygenation: Pre-oxygenation with 100% oxygen Intubation Type: IV induction Ventilation: Mask ventilation without difficulty Laryngoscope Size: Mac and 3 Grade View: Grade II Tube type: Oral Tube size: 7.5 mm Number of attempts: 1 Placement Confirmation: ETT inserted through vocal cords under direct vision,  breath sounds checked- equal and bilateral and positive ETCO2 Secured at: 22 cm Tube secured with: Tape Dental Injury: Teeth and Oropharynx as per pre-operative assessment

## 2014-08-30 NOTE — Transfer of Care (Signed)
Immediate Anesthesia Transfer of Care Note  Patient: Jordan Patterson  Procedure(s) Performed: Procedure(s): THORACIC TWELVE KYPHOPLASTY (N/A)  Patient Location: PACU  Anesthesia Type:General  Level of Consciousness: awake and alert   Airway & Oxygen Therapy: Patient Spontanous Breathing and Patient connected to face mask oxygen  Post-op Assessment: Report given to RN and Post -op Vital signs reviewed and stable  Post vital signs: Reviewed and stable  Last Vitals:  Filed Vitals:   08/30/14 1607  BP: 195/87  Pulse: 90  Temp: 37.1 C  Resp: 18    Complications: No apparent anesthesia complications

## 2014-08-31 ENCOUNTER — Encounter: Payer: Self-pay | Admitting: Family Medicine

## 2014-08-31 DIAGNOSIS — M4854XA Collapsed vertebra, not elsewhere classified, thoracic region, initial encounter for fracture: Secondary | ICD-10-CM | POA: Diagnosis not present

## 2014-08-31 LAB — GLUCOSE, CAPILLARY
Glucose-Capillary: 148 mg/dL — ABNORMAL HIGH (ref 65–99)
Glucose-Capillary: 109 mg/dL — ABNORMAL HIGH (ref 65–99)

## 2014-08-31 NOTE — Progress Notes (Signed)
Pt doing well. Pt and daughter given D/C instructions with verbal understanding. Pt's incision is closed with dermabond and has no evidence of infection. Pt's IV was removed prior to D/C. Pt D/C'd home via walking @ 0920 per MD order. Pt is stable @ D/C and has no other needs at this time. Rema FendtAshley Armonii Sieh, RN

## 2014-08-31 NOTE — Discharge Summary (Signed)
Physician Discharge Summary  Patient ID: Roxy HorsemanJean C Cupo MRN: [id number] DOB/AGE: 1940/05/02 74 y.o.  Admit date: 08/30/2014 Discharge date: 08/31/2014  Admission Diagnoses:  T12 compression fracture  Discharge Diagnoses:  T12 compression fracture Active Problems:   T12 compression fracture   Discharged Condition: good  Hospital Course: Patient admitted underwent a T12 kyphoplasty. Postoperatively she is an excellent relief of her back pain. She is up and active ambulation in the halls. She is asking to be discharged to home. She has been given instructions regarding wound care and activities following discharge. She is scheduled for follow-up with me in 3 weeks with x-rays at the office.  Discharge Exam: Blood pressure 159/86, pulse 76, temperature 98.4 F (36.9 C), temperature source Oral, resp. rate 16, height 5\' 8"  (1.727 m), weight 82.555 kg (182 lb), SpO2 96 %.  Disposition: Home    Medication List    TAKE these medications        albuterol 108 (90 BASE) MCG/ACT inhaler  Commonly known as:  VENTOLIN HFA  Inhale 2 puffs into the lungs every 6 (six) hours as needed for wheezing or shortness of breath.     calcitonin (salmon) 200 UNIT/ACT nasal spray  Commonly known as:  MIACALCIN/FORTICAL  Place 1 spray into alternate nostrils daily.     CoQ10 100 MG Caps  Take 100 mg by mouth daily.     diazepam 5 MG tablet  Commonly known as:  VALIUM  Take 1 tablet (5 mg total) by mouth every 12 (twelve) hours as needed for anxiety.     EPINEPHrine 0.3 mg/0.3 mL Soaj injection  Commonly known as:  EPI-PEN  Inject 0.3 mLs (0.3 mg total) into the muscle once.     EYE VITAMINS Caps  Take 1 capsule by mouth daily.     GARLIQUE PO  Take 1 tablet by mouth daily.     glucose blood test strip  ONE TOUCH ULTRA MINI Use as instructed to check sugar 2-3 daily. Dx: 250.00     levocetirizine 5 MG tablet  Commonly known as:  XYZAL  Take 1 tablet (5 mg total) by mouth every evening.     losartan 25 MG tablet  Commonly known as:  COZAAR  Take 1 tablet (25 mg total) by mouth daily.     Magnesium 250 MG Tabs  Take 250 mg by mouth daily.     Melatonin 5 MG Tabs  Take 5 mg by mouth at bedtime.     metFORMIN 500 MG tablet  Commonly known as:  GLUCOPHAGE  Take 1 tablet (500 mg total) by mouth daily with breakfast.     montelukast 10 MG tablet  Commonly known as:  SINGULAIR  Take 1 tablet (10 mg total) by mouth at bedtime.     multivitamin with minerals Tabs tablet  Take 1 tablet by mouth daily.     naproxen 500 MG tablet  Commonly known as:  NAPROSYN  Take 1 tablet (500 mg total) by mouth 2 (two) times daily as needed. Take with food     naproxen sodium 220 MG tablet  Commonly known as:  ANAPROX  Take 440 mg by mouth at bedtime.     NASACORT ALLERGY 24HR 55 MCG/ACT Aero nasal inhaler  Generic drug:  triamcinolone  Place 1 spray into both nostrils daily as needed (congestion).     NON FORMULARY  Allergy injections     omeprazole 40 MG capsule  Commonly known as:  PRILOSEC  Take 1 capsule (  40 mg total) by mouth 2 (two) times daily.     OSTEO BI-FLEX REGULAR STRENGTH PO  Take 2 tablets by mouth daily with supper.     REFRESH OP  Place 1 drop into both eyes daily.     SYNTHROID 88 MCG tablet  Generic drug:  levothyroxine  TAKE 1 TABLET BY MOUTH DAILY     traMADol 50 MG tablet  Commonly known as:  ULTRAM  Take 1 tablet (50 mg total) by mouth every 8 (eight) hours as needed.     triamterene-hydrochlorothiazide 37.5-25 MG per capsule  Commonly known as:  DYAZIDE  TAKE ONE CAPSULE BY MOUTH EVERY MORNING         Signed: NUDELMAN,ROBERT W 08/31/2014, 8:58 AM

## 2014-08-31 NOTE — Discharge Instructions (Signed)
Wound Care °Leave incision open to air. °You may shower. °Do not scrub directly on incision.  °Do not put any creams, lotions, or ointments on incision. °Activity °Walk each and every day, increasing distance each day. °No lifting greater than 5 lbs.  Avoid bending, arching, and twisting. °No driving for 2 weeks; may ride as a passenger locally. °If provided with back brace, wear when out of bed.  It is not necessary to wear in bed. °Diet °Resume your normal diet.  °Return to Work °Will be discussed at you follow up appointment. °Call Your Doctor If Any of These Occur °Redness, drainage, or swelling at the wound.  °Temperature greater than 101 degrees. °Severe pain not relieved by pain medication. °Incision starts to come apart. °Follow Up Appt °Call today for appointment in 3 weeks ([phone]) or for problems.  If you have any hardware placed in your spine, you will need an x-ray before your appointment. ° ° °Balloon Kyphoplasty °Balloon kyphoplasty is a procedure in which orthopedic balloons are used to gently raise a collapsed vertebral body in an attempt to alleviate pain. This condition is also called a vertebral compression fracture, or VCF. Most often the cause of this collapse is due to osteoporosis of the vertebral body, which makes the bone susceptible to minor trauma. Osteoporosis is a condition that comes on with aging. Osteoporosis is due to a loss of mineral from the bone. This causes a softening of the bones. The diagnosis of these fractures is usually made with X-rays or magnetic resonance imaging (MRI). Some advantages of this procedure are: °· Pain relief. °· Restoration of vertebral body height. °· Correction of spinal deformity. °· Relatively low complication rate. °The procedure is usually done by orthopedic surgeons, neurosurgeons, interventional radiologists, and interventional neuroradiologists who specialize in treating the spine with balloon kyphoplasty.  °This procedure is not useful  for: °· Patients with young, healthy bones or those who have sustained a vertebral body fracture or collapse in a major accident. °· Patients with spinal curvature such as scoliosis or kyphosis that is due to causes other than osteoporosis. °· Patients who suffer from spinal stenosis or herniated discs with nerve or spinal cord compression and loss of neurological function not associated with a vertebral compression fracture. °· Patients with known metastatic disease of the spine. °THE BENEFITS OF BALLOON KYPHOPLASTY INCLUDE: °· Reduction in back pain. If there is pain due to the procedure, it will typically lessen within two weeks. °· Improved quality of life. °· Improved mobility (you can get around better). °· Improved ability to perform activities of daily living. °PROCEDURE  °The kyphoplasty procedure involves the use of a balloon to restore the vertebral body height and shape. This is followed by placement of bone cement to strengthen it. The procedure may be done under intravenous sedation, local anesthetic, or general anesthetic. The patient lies face-down on the operating room table. X-ray machines are used to show the collapsed bones.  °The surgeon makes two small incisions. A tube is then inserted into the center of the vertebral body. Through this tube, balloons are placed in the vertebral body. Then the balloons are inflated. This creates a cavity. This pushes the bone back toward its normal height and shape.  °Once the cavity is created, the surgeon removes the inflatable balloon. The cement is mixed and used to fill the cavity in a slow and controlled fashion. The cement hardens. Then the surgeon takes out the tubes. The incisions are closed with a single   stitch. Patients usually go home the same day. Patients can go back to all normal activities of daily living as soon as possible. There are no restrictions.  °RISKS AND COMPLICATIONS °As with any surgery, there are potential risks. Although the  procedure is designed to minimize risks, complications may occur. Be sure to discuss the risks with your caregiver. Balloon kyphoplasty is not right for all patients. Complications may require more treatments. Complications that can occur include: °· The usual risks of local or general anesthetics apply. These risks depend on the patient's overall health. °· Heart attack (myocardial infarction). °· Stroke (cerebrovascular accident). °· A blockage in the lung (pulmonary embolism - there is a very small chance of the cement traveling to lungs). °· There is a small risk of the bone cement leaking from within the boundaries of the vertebral body. In most cases, this rare event does not cause any problems. However, if the cement does leak it may cause: °¨ Pain. °¨ Altered sensation. °¨ Very rarely, paralysis. °¨ Should the cement leak further, more significant surgery may be needed to stop the irritation of the nerves or spinal cord. °· In very rare circumstances, the cement may irritate or damage the spinal cord or nerves. °· Heart stops beating (cardiac arrest). °· Excessive bleeding (hemorrhage). °· Infection (there is a small chance of the cement block becoming infected at the time of surgery or even years later). °Document Released: 01/15/2004 Document Revised: 06/25/2013 Document Reviewed: 08/20/2008 °Exit[employer]® Patient Information ©2015 [employer]. This information is not intended to replace advice given to you by your health care provider. Make sure you discuss any questions you have with your health care provider. ° °

## 2014-09-02 ENCOUNTER — Telehealth: Payer: Self-pay | Admitting: Internal Medicine

## 2014-09-02 ENCOUNTER — Encounter (HOSPITAL_COMMUNITY): Payer: Self-pay | Admitting: Neurosurgery

## 2014-09-02 NOTE — Telephone Encounter (Signed)
I called [name] and told her [name] it was okay for her to get her allergy shot 09/03/14. She already has an appt.. Nothing further needed.

## 2014-09-02 NOTE — Telephone Encounter (Signed)
Yes, no problem if she feels well enough to get here

## 2014-09-02 NOTE — Telephone Encounter (Signed)
Pt states that she has a surgical procedure on Friday 08/30/14 and was placed under general anesthesia - Kyphoplasty Pt is wanting to know if she can still get her allergy vaccine tomorrow 09/03/14. Please advise Dr Maple HudsonYoung. Thanks.

## 2014-09-03 ENCOUNTER — Ambulatory Visit (INDEPENDENT_AMBULATORY_CARE_PROVIDER_SITE_OTHER): Payer: Medicare Other

## 2014-09-03 DIAGNOSIS — J309 Allergic rhinitis, unspecified: Secondary | ICD-10-CM | POA: Diagnosis not present

## 2014-09-09 ENCOUNTER — Ambulatory Visit (INDEPENDENT_AMBULATORY_CARE_PROVIDER_SITE_OTHER): Payer: Medicare Other

## 2014-09-09 DIAGNOSIS — J309 Allergic rhinitis, unspecified: Secondary | ICD-10-CM

## 2014-09-11 ENCOUNTER — Ambulatory Visit: Payer: Medicare Other

## 2014-09-16 ENCOUNTER — Ambulatory Visit (INDEPENDENT_AMBULATORY_CARE_PROVIDER_SITE_OTHER): Payer: Medicare Other

## 2014-09-16 DIAGNOSIS — J309 Allergic rhinitis, unspecified: Secondary | ICD-10-CM

## 2014-09-23 ENCOUNTER — Ambulatory Visit (INDEPENDENT_AMBULATORY_CARE_PROVIDER_SITE_OTHER): Payer: Medicare Other

## 2014-09-23 DIAGNOSIS — J309 Allergic rhinitis, unspecified: Secondary | ICD-10-CM

## 2014-09-30 ENCOUNTER — Ambulatory Visit: Payer: Medicare Other

## 2014-10-02 ENCOUNTER — Ambulatory Visit (INDEPENDENT_AMBULATORY_CARE_PROVIDER_SITE_OTHER): Payer: Medicare Other

## 2014-10-02 DIAGNOSIS — J309 Allergic rhinitis, unspecified: Secondary | ICD-10-CM | POA: Diagnosis not present

## 2014-10-03 ENCOUNTER — Encounter: Payer: Self-pay | Admitting: Family Medicine

## 2014-10-03 ENCOUNTER — Telehealth: Payer: Self-pay | Admitting: *Deleted

## 2014-10-03 ENCOUNTER — Ambulatory Visit (INDEPENDENT_AMBULATORY_CARE_PROVIDER_SITE_OTHER): Payer: Medicare Other | Admitting: Family Medicine

## 2014-10-03 VITALS — BP 160/80 | HR 96 | Temp 98.3°F | Wt 181.0 lb

## 2014-10-03 DIAGNOSIS — K219 Gastro-esophageal reflux disease without esophagitis: Secondary | ICD-10-CM | POA: Diagnosis not present

## 2014-10-03 DIAGNOSIS — E119 Type 2 diabetes mellitus without complications: Secondary | ICD-10-CM | POA: Diagnosis not present

## 2014-10-03 DIAGNOSIS — M8580 Other specified disorders of bone density and structure, unspecified site: Secondary | ICD-10-CM

## 2014-10-03 DIAGNOSIS — I10 Essential (primary) hypertension: Secondary | ICD-10-CM | POA: Diagnosis not present

## 2014-10-03 DIAGNOSIS — S22080S Wedge compression fracture of T11-T12 vertebra, sequela: Secondary | ICD-10-CM

## 2014-10-03 DIAGNOSIS — E039 Hypothyroidism, unspecified: Secondary | ICD-10-CM | POA: Diagnosis not present

## 2014-10-03 DIAGNOSIS — E785 Hyperlipidemia, unspecified: Secondary | ICD-10-CM

## 2014-10-03 DIAGNOSIS — M4854XS Collapsed vertebra, not elsewhere classified, thoracic region, sequela of fracture: Secondary | ICD-10-CM

## 2014-10-03 DIAGNOSIS — R05 Cough: Secondary | ICD-10-CM

## 2014-10-03 DIAGNOSIS — M1990 Unspecified osteoarthritis, unspecified site: Secondary | ICD-10-CM

## 2014-10-03 DIAGNOSIS — R059 Cough, unspecified: Secondary | ICD-10-CM

## 2014-10-03 LAB — T4, FREE: Free T4: 1.03 ng/dL (ref 0.60–1.60)

## 2014-10-03 LAB — HEMOGLOBIN A1C: Hgb A1c MFr Bld: 6.6 % — ABNORMAL HIGH (ref 4.6–6.5)

## 2014-10-03 LAB — TSH: TSH: 4.69 u[IU]/mL — ABNORMAL HIGH (ref 0.35–4.50)

## 2014-10-03 MED ORDER — LOSARTAN POTASSIUM 50 MG PO TABS: 50.0000 mg | ORAL_TABLET | Freq: Every day | ORAL | Status: DC

## 2014-10-03 NOTE — Assessment & Plan Note (Signed)
Chronic.  With recent addition of metformin, will check A1c today.

## 2014-10-03 NOTE — Patient Instructions (Addendum)
Blood work today. We will order bone density scan. Increase losartan to  daily (double up until you run out then new dose will be at pharmacy). Return as needed or in 4 months for medicare wellness visit. Good to see you today, call us with questions

## 2014-10-03 NOTE — Assessment & Plan Note (Signed)
Chronic, deteriorated. Increase losartan to  daily. Continue maxzide.

## 2014-10-03 NOTE — Assessment & Plan Note (Signed)
Discussed NSAID use.

## 2014-10-03 NOTE — Progress Notes (Signed)
BP 160/80 mmHg  Pulse 96  Temp(Src) 98.3 F (36.8 C) (Oral)  Wt 181 lb (82.101 kg)   CC: f/u visit  Subjective:    Patient ID: Jordan Patterson, female    DOB: April 06, 1940, 74 y.o.   MRN: [id number]  HPI: Jordan Patterson is a 74 y.o. female presenting on 10/03/2014 for Follow-up   T12 compression fracture - s/p kyphoplasty by Dr Newell Coral with improvement in pain. Continues to see chiropractor. Pt tried gabapentin  but made her too woozy. States vitamin D was normal. Dry hacking cough since surgery. Known GERD. Allergic rhinitis controlled with multiple meds and 2 allergy shots.   DM - regularly does check sugars and running well.  Compliant with antihyperglycemic regimen which includes: metformin  daily. Denies low sugars or hypoglycemic symptoms.  Denies paresthesias. Last diabetic eye exam 04/2014 per patient.  Pneumovax: 2007.  Prevnar: declined previously.  Lab Results  Component Value Date   HGBA1C 7.1* 05/23/2014   Diabetic Foot Exam - Simple   Simple Foot Form  Diabetic Foot exam was performed with the following findings:  Yes 10/03/2014 10:40 AM  Visual Inspection  No deformities, no ulcerations, no other skin breakdown bilaterally:  Yes  Sensation Testing  Intact to touch and monofilament testing bilaterally:  Yes  Pulse Check  Posterior Tibialis and Dorsalis pulse intact bilaterally:  Yes  Comments       HTN - Compliant with current antihypertensive regimen of maxzide 37.5/25mg  daily and losartain  daily.  Does check blood pressures at home: brings log of 140-170/80s.  No low blood pressure readings or symptoms of dizziness/syncope.  Denies HA, vision changes, CP/tightness, SOB, leg swelling.    HLD - hesitant for statins because of intolerance to crestor. Last visit she was to look into zetia. Declines.   Hypothyroid - on Synthroid brand daily. Lab Results  Component Value Date   TSH 4.85* 05/23/2014     Relevant past medical, surgical, family and  social history reviewed and updated as indicated. Interim medical history since our last visit reviewed. Allergies and medications reviewed and updated. Current Outpatient Prescriptions on File Prior to Visit  Medication Sig  . albuterol (VENTOLIN HFA) 108 (90 BASE) MCG/ACT inhaler Inhale 2 puffs into the lungs every 6 (six) hours as needed for wheezing or shortness of breath.  . calcitonin, salmon, (MIACALCIN/FORTICAL) 200 UNIT/ACT nasal spray Place 1 spray into alternate nostrils daily.  . Coenzyme Q10 (COQ10) 100 MG CAPS Take 100 mg by mouth daily.   Marland Kitchen EPINEPHrine 0.3 mg/0.3 mL IJ SOAJ injection Inject 0.3 mLs (0.3 mg total) into the muscle once. (Patient taking differently: Inject 0.3 mg into the muscle as needed (allergic reaction). )  . Garlic (GARLIQUE PO) Take 1 tablet by mouth daily.  . Glucosamine-Chondroitin (OSTEO BI-FLEX REGULAR STRENGTH PO) Take 2 tablets by mouth daily with supper.  Marland Kitchen glucose blood test strip ONE TOUCH ULTRA MINI Use as instructed to check sugar 2-3 daily. Dx: 250.00  . levocetirizine (XYZAL) 5 MG tablet Take 1 tablet (5 mg total) by mouth every evening. (Patient taking differently: Take 5 mg by mouth at bedtime. )  . Magnesium 250 MG TABS Take 250 mg by mouth daily.  . Melatonin 5 MG TABS Take 5 mg by mouth at bedtime.   . metFORMIN (GLUCOPHAGE) 500 MG tablet Take 1 tablet (500 mg total) by mouth daily with breakfast.  . montelukast (SINGULAIR) 10 MG tablet Take 1 tablet (10 mg total) by mouth  at bedtime.  . Multiple Vitamin (MULTIVITAMIN WITH MINERALS) TABS tablet Take 1 tablet by mouth daily.  . Multiple Vitamins-Minerals (EYE VITAMINS) CAPS Take 1 capsule by mouth daily.  . naproxen sodium (ANAPROX) 220 MG tablet Take 440 mg by mouth at bedtime.  . NON FORMULARY Allergy injections  . omeprazole (PRILOSEC) 40 MG capsule Take 1 capsule (40 mg total) by mouth 2 (two) times daily.  . Polyvinyl Alcohol-Povidone (REFRESH OP) Place 1 drop into both eyes daily.   Marland Kitchen  SYNTHROID 88 MCG tablet TAKE 1 TABLET BY MOUTH DAILY  . triamcinolone (NASACORT ALLERGY 24HR) 55 MCG/ACT AERO nasal inhaler Place 1 spray into both nostrils daily as needed (congestion).   . triamterene-hydrochlorothiazide (DYAZIDE) 37.5-25 MG per capsule TAKE ONE CAPSULE BY MOUTH EVERY MORNING   No current facility-administered medications on file prior to visit.    Review of Systems Per HPI unless specifically indicated above     Objective:    BP 160/80 mmHg  Pulse 96  Temp(Src) 98.3 F (36.8 C) (Oral)  Wt 181 lb (82.101 kg)  Wt Readings from Last 3 Encounters:  10/03/14 181 lb (82.101 kg)  08/30/14 182 lb (82.555 kg)  08/06/14 183 lb 8 oz (83.235 kg)    Physical Exam  Constitutional: She appears well-developed and well-nourished. No distress.  HENT:  Head: Normocephalic and atraumatic.  Right Ear: External ear normal.  Left Ear: External ear normal.  Nose: Nose normal.  Mouth/Throat: Oropharynx is clear and moist. No oropharyngeal exudate.  Eyes: Conjunctivae and EOM are normal. Pupils are equal, round, and reactive to light. No scleral icterus.  Neck: Normal range of motion. Neck supple. No thyromegaly present.  Cardiovascular: Normal rate, regular rhythm, normal heart sounds and intact distal pulses.   No murmur heard. Pulmonary/Chest: Effort normal and breath sounds normal. No respiratory distress. She has no wheezes. She has no rales.  Musculoskeletal: She exhibits no edema.  See HPI for foot exam if done  Lymphadenopathy:    She has no cervical adenopathy.  Skin: Skin is warm and dry. No rash noted.  Psychiatric: She has a normal mood and affect.  Nursing note and vitals reviewed.  Results for orders placed or performed in visit on 10/03/14  HM DIABETES EYE EXAM  Result Value Ref Range   HM Diabetic Eye Exam No Retinopathy No Retinopathy      Assessment & Plan:   Problem List Items Addressed This Visit    GERD (gastroesophageal reflux disease)    Continue  PPI BID.      Hypertension    Chronic, deteriorated. Increase losartan to 50mg  daily. Continue maxzide.       Relevant Medications   losartan (COZAAR) 50 MG tablet   Hyperlipidemia    Continues to decline statin as well as zetia.       Relevant Medications   losartan (COZAAR) 50 MG tablet   Hypothyroidism    Check TFTs today.      Relevant Orders   TSH   T4, free   Osteopenia - Primary    With compression fracture. Recheck DEXA today.      Relevant Orders   DG Bone Density   Diabetes type 2, controlled    Chronic.  With recent addition of metformin, will check A1c today.      Relevant Medications   losartan (COZAAR) 50 MG tablet   Other Relevant Orders   Hemoglobin A1c   Osteoarthritis    Discussed NSAID use.  Cough    ?GERD vs recent surgery related.      Compression fracture of T12 vertebra    Continues salmon calcitonin nasal spray. Check DEXA.          Follow up plan: Return in about 4 months (around 02/02/2015), or as needed, for medicare wellness visit.

## 2014-10-03 NOTE — Assessment & Plan Note (Signed)
Continues salmon calcitonin nasal spray. Check DEXA.

## 2014-10-03 NOTE — Progress Notes (Signed)
Pre visit review using our clinic review tool, if applicable. No additional management support is needed unless otherwise documented below in the visit note. 

## 2014-10-03 NOTE — Assessment & Plan Note (Signed)
Check TFTs today

## 2014-10-03 NOTE — Assessment & Plan Note (Signed)
With compression fracture. Recheck DEXA today.

## 2014-10-03 NOTE — Assessment & Plan Note (Signed)
?  GERD vs recent surgery related.

## 2014-10-03 NOTE — Assessment & Plan Note (Signed)
Continues to decline statin as well as zetia.

## 2014-10-03 NOTE — Telephone Encounter (Signed)
Patient asked about the cough she has had since the intubation from her surgery. She said it was mentioned during her visit, but nothing else was ever discussed. What should she do? Should she be concerned? How long before it goes away?

## 2014-10-03 NOTE — Assessment & Plan Note (Signed)
Continue PPI BID 

## 2014-10-04 ENCOUNTER — Other Ambulatory Visit: Payer: Self-pay | Admitting: Family Medicine

## 2014-10-04 ENCOUNTER — Encounter: Payer: Self-pay | Admitting: Family Medicine

## 2014-10-04 DIAGNOSIS — E039 Hypothyroidism, unspecified: Secondary | ICD-10-CM

## 2014-10-04 MED ORDER — SYNTHROID 100 MCG PO TABS: 100.0000 ug | ORAL_TABLET | Freq: Every day | ORAL | Status: DC

## 2014-10-04 NOTE — Telephone Encounter (Signed)
Pt notified of Dr. Nicanor Alcon comments/recommendations and verbalized understanding. Pt said that she doesn't want to try tessalon Rx she will just keep using cough drops and if sxs don't improve she will update [place]

## 2014-10-04 NOTE — Telephone Encounter (Signed)
Regarding cough - I do think this may be from the recent surgery - update me if persistent cough over next 3-4 weeks. Try cough drops for this. Let me know if she'd like to try a medicine for this (tessalon perls).

## 2014-10-07 ENCOUNTER — Other Ambulatory Visit: Payer: Self-pay

## 2014-10-07 DIAGNOSIS — Z1231 Encounter for screening mammogram for malignant neoplasm of breast: Secondary | ICD-10-CM

## 2014-10-08 ENCOUNTER — Ambulatory Visit: Payer: Medicare Other

## 2014-10-09 ENCOUNTER — Ambulatory Visit (INDEPENDENT_AMBULATORY_CARE_PROVIDER_SITE_OTHER): Payer: Medicare Other

## 2014-10-09 DIAGNOSIS — J309 Allergic rhinitis, unspecified: Secondary | ICD-10-CM | POA: Diagnosis not present

## 2014-10-10 ENCOUNTER — Encounter: Payer: Self-pay | Admitting: Internal Medicine

## 2014-10-11 ENCOUNTER — Encounter: Payer: Self-pay | Admitting: *Deleted

## 2014-10-11 ENCOUNTER — Ambulatory Visit
Admission: RE | Admit: 2014-10-11 | Discharge: 2014-10-11 | Disposition: A | Discharge status: Home / Self Care | Payer: Medicare Other | Source: Ambulatory Visit | Attending: Family Medicine | Admitting: Family Medicine

## 2014-10-11 ENCOUNTER — Ambulatory Visit
Admission: RE | Admit: 2014-10-11 | Discharge: 2014-10-11 | Disposition: A | Discharge status: Home / Self Care | Payer: Medicare Other | Source: Ambulatory Visit

## 2014-10-11 DIAGNOSIS — Z1231 Encounter for screening mammogram for malignant neoplasm of breast: Secondary | ICD-10-CM

## 2014-10-11 DIAGNOSIS — M8580 Other specified disorders of bone density and structure, unspecified site: Secondary | ICD-10-CM

## 2014-10-11 LAB — HM MAMMOGRAPHY: HM Mammogram: NORMAL

## 2014-10-12 ENCOUNTER — Encounter: Payer: Self-pay | Admitting: Family Medicine

## 2014-10-16 ENCOUNTER — Ambulatory Visit (INDEPENDENT_AMBULATORY_CARE_PROVIDER_SITE_OTHER): Payer: Medicare Other

## 2014-10-16 DIAGNOSIS — J309 Allergic rhinitis, unspecified: Secondary | ICD-10-CM

## 2014-10-23 ENCOUNTER — Ambulatory Visit (INDEPENDENT_AMBULATORY_CARE_PROVIDER_SITE_OTHER): Payer: Medicare Other

## 2014-10-23 DIAGNOSIS — J309 Allergic rhinitis, unspecified: Secondary | ICD-10-CM

## 2014-10-24 HISTORY — PX: US ECHOCARDIOGRAPHY: HXRAD669

## 2014-10-30 ENCOUNTER — Ambulatory Visit (INDEPENDENT_AMBULATORY_CARE_PROVIDER_SITE_OTHER): Payer: Medicare Other

## 2014-10-30 DIAGNOSIS — J309 Allergic rhinitis, unspecified: Secondary | ICD-10-CM | POA: Diagnosis not present

## 2014-11-01 ENCOUNTER — Encounter: Payer: Self-pay | Admitting: Family Medicine

## 2014-11-06 ENCOUNTER — Ambulatory Visit (INDEPENDENT_AMBULATORY_CARE_PROVIDER_SITE_OTHER): Payer: Medicare Other

## 2014-11-06 ENCOUNTER — Encounter: Payer: Self-pay | Admitting: Internal Medicine

## 2014-11-06 DIAGNOSIS — Z23 Encounter for immunization: Secondary | ICD-10-CM | POA: Diagnosis not present

## 2014-11-06 DIAGNOSIS — J309 Allergic rhinitis, unspecified: Secondary | ICD-10-CM | POA: Diagnosis not present

## 2014-11-12 ENCOUNTER — Other Ambulatory Visit: Payer: Self-pay | Admitting: Internal Medicine

## 2014-11-13 ENCOUNTER — Ambulatory Visit (INDEPENDENT_AMBULATORY_CARE_PROVIDER_SITE_OTHER): Payer: Medicare Other

## 2014-11-13 DIAGNOSIS — J309 Allergic rhinitis, unspecified: Secondary | ICD-10-CM

## 2014-11-15 ENCOUNTER — Encounter: Payer: Self-pay | Admitting: Family Medicine

## 2014-11-17 NOTE — Telephone Encounter (Signed)
plz call Monday to schedule appointment.

## 2014-11-18 ENCOUNTER — Encounter: Payer: Self-pay | Admitting: Family Medicine

## 2014-11-18 ENCOUNTER — Ambulatory Visit (INDEPENDENT_AMBULATORY_CARE_PROVIDER_SITE_OTHER): Payer: Medicare Other | Admitting: Family Medicine

## 2014-11-18 VITALS — BP 144/88 | HR 80 | Temp 97.6°F | Wt 182.8 lb

## 2014-11-18 DIAGNOSIS — R0789 Other chest pain: Secondary | ICD-10-CM

## 2014-11-18 DIAGNOSIS — E039 Hypothyroidism, unspecified: Secondary | ICD-10-CM

## 2014-11-18 DIAGNOSIS — I10 Essential (primary) hypertension: Secondary | ICD-10-CM

## 2014-11-18 DIAGNOSIS — S22080S Wedge compression fracture of T11-T12 vertebra, sequela: Secondary | ICD-10-CM

## 2014-11-18 DIAGNOSIS — R079 Chest pain, unspecified: Secondary | ICD-10-CM | POA: Diagnosis not present

## 2014-11-18 DIAGNOSIS — M4854XS Collapsed vertebra, not elsewhere classified, thoracic region, sequela of fracture: Secondary | ICD-10-CM

## 2014-11-18 DIAGNOSIS — E119 Type 2 diabetes mellitus without complications: Secondary | ICD-10-CM | POA: Diagnosis not present

## 2014-11-18 DIAGNOSIS — E785 Hyperlipidemia, unspecified: Secondary | ICD-10-CM

## 2014-11-18 DIAGNOSIS — K219 Gastro-esophageal reflux disease without esophagitis: Secondary | ICD-10-CM

## 2014-11-18 NOTE — Telephone Encounter (Signed)
Appt scheduled

## 2014-11-18 NOTE — Assessment & Plan Note (Addendum)
EKG with poor R wave progression - check Echo and refer to cards for further eval in setting of chest discomfort. Encouraged starting statin - pt prefers to discuss with cards. Advised she restart aspirin. This could possibly be related to worsening indigestion in h/o known GERD - rec continue omeprazole  bid, add zantac nightly in the interim. Also rec stop aleve, take tylenol for back pain. Pt agrees with plan.  EKG - NSR rate 80, normal axis, intervals, no acute ST/T changes, but poor R wave progression anteriorly, LAA.

## 2014-11-18 NOTE — Assessment & Plan Note (Signed)
Chronic, stable. Continue losartan 50mg daily.  

## 2014-11-18 NOTE — Progress Notes (Signed)
Pre visit review using our clinic review tool, if applicable. No additional management support is needed unless otherwise documented below in the visit note. 

## 2014-11-18 NOTE — Assessment & Plan Note (Signed)
Chronic, stable on low dose metformin. Continue.

## 2014-11-18 NOTE — Assessment & Plan Note (Addendum)
?  etiology of chest discomfort. Continue PPI BID, add zantac, stop NSAID.

## 2014-11-18 NOTE — Patient Instructions (Addendum)
Pass by [facility]'s office for referral to Dr Garnette Scheuermann and echocardiogram (ultrasound of heart). Consider statin medication. Start baby aspirin daily. Avoid naproxyn, take tylenol instead.  Consider zantac or pepcid at night time.

## 2014-11-18 NOTE — Assessment & Plan Note (Signed)
Thyroid recently increased. Due to recheck TSH in 1-2 wks.

## 2014-11-18 NOTE — Assessment & Plan Note (Signed)
Stays sore. Slowly returning to work. rec tylenol instead of NSAIDs until cleared by cards.

## 2014-11-18 NOTE — Progress Notes (Signed)
BP 144/88 mmHg  Pulse 80  Temp(Src) 97.6 F (36.4 C) (Oral)  Wt 182 lb 12 oz (82.895 kg)   CC: chest discomfort  Subjective:    Patient ID: Jordan Patterson, female    DOB: [birth year], 74 y.o.   MRN: [id number]  HPI: Jordan Patterson is a 74 y.o. female with HLD, HTN, asthma, GERD with HH and controlled diabetes presenting on 11/18/2014 for Chest Pain   Last week chest pain started - persistent for several days. 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 Last saw Dr [name] 03/2014 - stable, continue PPI BID. She has been regular with omeprazole  bid. Discomfort not associated with dyspnea or nausea. Could last up to several hours before it went away. Not exertional, not relieved by rest.   Overall sedentary lifestyle but does walk twice daily 1 mile at a time. Chest discomfort occasionally noticed when walking.  No fmhx CAD.  HLD off statin.  Controlled hypertensive on losartan  daily.  Controlled diabetic on metformin  daily.   She did see cardiologist Dr Garnette Scheuermann years ago - but mainly to evaluate for MVP given family history.  H/o T12 vertebral compression fracture s/p kyphoplasty 08/2014. Returned to work last week, 2 hours per day - easing back in. Looking into posture brace.  Not currently taking aleve.   Relevant past medical, surgical, family and social history reviewed and updated as indicated. Interim medical history since our last visit reviewed. Allergies and medications reviewed and updated. Current Outpatient Prescriptions on File Prior to Visit  Medication Sig  . albuterol (VENTOLIN HFA) 108 (90 BASE) MCG/ACT inhaler Inhale 2 puffs into the lungs every 6 (six) hours as needed for wheezing or shortness of breath.  . calcitonin, salmon, (MIACALCIN/FORTICAL) 200 UNIT/ACT nasal spray Place 1 spray into alternate nostrils daily.  . Coenzyme Q10 (COQ10) 100 MG CAPS Take 100 mg by mouth daily.   Marland Kitchen EPINEPHrine  0.3 mg/0.3 mL IJ SOAJ injection Inject 0.3 mLs (0.3 mg total) into the muscle once. (Patient taking differently: Inject 0.3 mg into the muscle as needed (allergic reaction). )  . Garlic (GARLIQUE PO) Take 1 tablet by mouth daily.  . Glucosamine-Chondroitin (OSTEO BI-FLEX REGULAR STRENGTH PO) Take 2 tablets by mouth daily with supper.  Marland Kitchen glucose blood test strip ONE TOUCH ULTRA MINI Use as instructed to check sugar 2-3 daily. Dx: 250.00  . levocetirizine (XYZAL) 5 MG tablet TAKE ONE TABLET BY MOUTH EACH EVENING  . losartan (COZAAR) 50 MG tablet Take 1 tablet (50 mg total) by mouth daily.  . Magnesium 250 MG TABS Take 250 mg by mouth daily.  . Melatonin 5 MG TABS Take 5 mg by mouth at bedtime.   . metFORMIN (GLUCOPHAGE) 500 MG tablet Take 1 tablet (500 mg total) by mouth daily with breakfast.  . montelukast (SINGULAIR) 10 MG tablet Take 1 tablet (10 mg total) by mouth at bedtime.  . Multiple Vitamin (MULTIVITAMIN WITH MINERALS) TABS tablet Take 1 tablet by mouth daily.  . Multiple Vitamins-Minerals (EYE VITAMINS) CAPS Take 1 capsule by mouth daily.  . NON FORMULARY Allergy injections  . NONFORMULARY OR COMPOUNDED ITEM Allergy Vaccine 1:50 Given at [facility]  . omeprazole (PRILOSEC) 40 MG capsule Take 1 capsule (40 mg total) by mouth 2 (two) times daily.  . Polyvinyl Alcohol-Povidone (REFRESH OP) Place 1 drop into both eyes daily.   Marland Kitchen SYNTHROID 100 MCG tablet Take 1 tablet (100  mcg total) by mouth daily before breakfast.  . triamcinolone (NASACORT ALLERGY 24HR) 55 MCG/ACT AERO nasal inhaler Place 1 spray into both nostrils daily as needed (congestion).   . triamterene-hydrochlorothiazide (DYAZIDE) 37.5-25 MG per capsule TAKE ONE CAPSULE BY MOUTH EVERY MORNING  . Vitamin D, Cholecalciferol, 400 UNITS CAPS Take 1 capsule by mouth daily.  . naproxen sodium (ANAPROX) 220 MG tablet Take 440 mg by mouth at bedtime.   No current facility-administered medications on file prior to visit.     Review of Systems Per HPI unless specifically indicated above     Objective:    BP 144/88 mmHg  Pulse 80  Temp(Src) 97.6 F (36.4 C) (Oral)  Wt 182 lb 12 oz (82.895 kg)  Wt Readings from Last 3 Encounters:  11/18/14 182 lb 12 oz (82.895 kg)  10/03/14 181 lb (82.101 kg)  08/30/14 182 lb (82.555 kg)    Physical Exam  Constitutional: She appears well-developed and well-nourished. No distress.  HENT:  Mouth/Throat: Oropharynx is clear and moist. No oropharyngeal exudate.  Cardiovascular: Normal rate, regular rhythm, normal heart sounds and intact distal pulses.   No murmur heard. Pulmonary/Chest: Effort normal and breath sounds normal. No respiratory distress. She has no wheezes. She has no rales.  Abdominal: Soft. Bowel sounds are normal. She exhibits no distension and no mass. There is no tenderness. There is no rebound and no guarding.  Musculoskeletal: She exhibits no edema.  Lymphadenopathy:    She has no cervical adenopathy.  Skin: Skin is warm and dry. No rash noted.  Psychiatric: She has a normal mood and affect.  Nursing note and vitals reviewed.  Results for orders placed or performed in visit on 10/11/14  [facility] MAMMOGRAPHY  Result Value Ref Range   [facility] Mammogram Normal Birads 1-Repeat 1 year    Lab Results  Component Value Date   TSH 4.69* 10/03/2014   Lab Results  Component Value Date   HGBA1C 6.6* 10/03/2014       Assessment & Plan:   Problem List Items Addressed This Visit    Hypothyroidism    Thyroid recently increased. Due to recheck TSH in 1-2 wks.      Hypertension    Chronic, stable. Continue losartan  daily.      Hyperlipidemia    Discussed starting lipitor vs lower potency statin. Currently declines statin/zetia due to side effect concerns (intolerance to crestor). To discuss with cards.       GERD (gastroesophageal reflux disease)    ?etiology of chest discomfort. Continue PPI BID, add zantac, stop NSAID.      Diabetes type 2,  controlled    Chronic, stable on low dose metformin. Continue.      Compression fracture of T12 vertebra    Stays sore. Slowly returning to work. rec tylenol instead of NSAIDs until cleared by cards.      Chest pain - Primary    EKG with poor R wave progression - check Echo and refer to cards for further eval in setting of chest discomfort. Encouraged starting statin - pt prefers to discuss with cards. Advised she restart aspirin. This could possibly be related to worsening indigestion in h/o known GERD - rec continue omeprazole  bid, add zantac nightly in the interim. Also rec stop aleve, take tylenol for back pain. Pt agrees with plan.  EKG - NSR rate 80, normal axis, intervals, no acute ST/T changes, but poor R wave progression anteriorly, LAA.      Relevant Orders  EKG 12-Lead (Completed)   Ambulatory referral to Cardiology   Echocardiogram       Follow up plan: Return if symptoms worsen or fail to improve.

## 2014-11-18 NOTE — Assessment & Plan Note (Signed)
Discussed starting lipitor vs lower potency statin. Currently declines statin/zetia due to side effect concerns (intolerance to crestor). To discuss with cards.

## 2014-11-20 ENCOUNTER — Ambulatory Visit (INDEPENDENT_AMBULATORY_CARE_PROVIDER_SITE_OTHER): Payer: Medicare Other

## 2014-11-20 ENCOUNTER — Telehealth: Payer: Self-pay | Admitting: Pulmonary Disease

## 2014-11-20 DIAGNOSIS — J309 Allergic rhinitis, unspecified: Secondary | ICD-10-CM

## 2014-11-20 MED ORDER — ALBUTEROL SULFATE HFA 108 (90 BASE) MCG/ACT IN AERS: 2.0000 | INHALATION_SPRAY | Freq: Four times a day (QID) | RESPIRATORY_TRACT | Status: DC | PRN

## 2014-11-20 NOTE — Telephone Encounter (Signed)
Called spoke with pt. She needs refill on ventolin. RX sent in. Nothing further needed

## 2014-11-22 ENCOUNTER — Ambulatory Visit (HOSPITAL_COMMUNITY): Payer: Medicare Other | Attending: Cardiovascular Disease

## 2014-11-22 ENCOUNTER — Other Ambulatory Visit: Payer: Self-pay

## 2014-11-22 DIAGNOSIS — I10 Essential (primary) hypertension: Secondary | ICD-10-CM | POA: Insufficient documentation

## 2014-11-22 DIAGNOSIS — I517 Cardiomegaly: Secondary | ICD-10-CM | POA: Insufficient documentation

## 2014-11-22 DIAGNOSIS — E785 Hyperlipidemia, unspecified: Secondary | ICD-10-CM | POA: Diagnosis not present

## 2014-11-22 DIAGNOSIS — I351 Nonrheumatic aortic (valve) insufficiency: Secondary | ICD-10-CM | POA: Diagnosis not present

## 2014-11-22 DIAGNOSIS — R079 Chest pain, unspecified: Secondary | ICD-10-CM | POA: Diagnosis present

## 2014-11-22 DIAGNOSIS — E119 Type 2 diabetes mellitus without complications: Secondary | ICD-10-CM | POA: Insufficient documentation

## 2014-11-23 ENCOUNTER — Encounter: Payer: Self-pay | Admitting: Family Medicine

## 2014-11-25 ENCOUNTER — Telehealth: Payer: Self-pay | Admitting: Internal Medicine

## 2014-11-25 NOTE — Telephone Encounter (Signed)
Allergy Serum Extract Date Mixed: 11/25/2014 Vial: AB Strength: 1:10 Here/Mail/Pick Up: Here Mixed By: Alonia Dibuono, CMA 

## 2014-11-26 ENCOUNTER — Ambulatory Visit (INDEPENDENT_AMBULATORY_CARE_PROVIDER_SITE_OTHER): Payer: Medicare Other

## 2014-11-26 DIAGNOSIS — J309 Allergic rhinitis, unspecified: Secondary | ICD-10-CM | POA: Diagnosis not present

## 2014-11-27 ENCOUNTER — Ambulatory Visit (INDEPENDENT_AMBULATORY_CARE_PROVIDER_SITE_OTHER): Payer: Medicare Other

## 2014-11-27 DIAGNOSIS — J309 Allergic rhinitis, unspecified: Secondary | ICD-10-CM

## 2014-12-04 ENCOUNTER — Ambulatory Visit (INDEPENDENT_AMBULATORY_CARE_PROVIDER_SITE_OTHER): Payer: Medicare Other

## 2014-12-04 DIAGNOSIS — J309 Allergic rhinitis, unspecified: Secondary | ICD-10-CM | POA: Diagnosis not present

## 2014-12-11 ENCOUNTER — Ambulatory Visit (INDEPENDENT_AMBULATORY_CARE_PROVIDER_SITE_OTHER): Payer: Medicare Other

## 2014-12-11 DIAGNOSIS — J309 Allergic rhinitis, unspecified: Secondary | ICD-10-CM | POA: Diagnosis not present

## 2014-12-13 ENCOUNTER — Other Ambulatory Visit: Payer: Self-pay | Admitting: Family Medicine

## 2014-12-18 ENCOUNTER — Ambulatory Visit (INDEPENDENT_AMBULATORY_CARE_PROVIDER_SITE_OTHER): Payer: Medicare Other

## 2014-12-18 DIAGNOSIS — J309 Allergic rhinitis, unspecified: Secondary | ICD-10-CM

## 2014-12-23 ENCOUNTER — Encounter: Payer: Self-pay | Admitting: Interventional Cardiology

## 2014-12-25 ENCOUNTER — Ambulatory Visit (INDEPENDENT_AMBULATORY_CARE_PROVIDER_SITE_OTHER): Payer: Medicare Other

## 2014-12-25 DIAGNOSIS — J309 Allergic rhinitis, unspecified: Secondary | ICD-10-CM | POA: Diagnosis not present

## 2014-12-26 ENCOUNTER — Telehealth: Payer: Self-pay | Admitting: *Deleted

## 2014-12-26 ENCOUNTER — Encounter: Payer: Self-pay | Admitting: Family Medicine

## 2014-12-26 ENCOUNTER — Ambulatory Visit (INDEPENDENT_AMBULATORY_CARE_PROVIDER_SITE_OTHER): Payer: Medicare Other | Admitting: Family Medicine

## 2014-12-26 VITALS — BP 136/82 | HR 84 | Temp 98.6°F | Wt 182.2 lb

## 2014-12-26 DIAGNOSIS — M25561 Pain in right knee: Secondary | ICD-10-CM

## 2014-12-26 MED ORDER — DICLOFENAC SODIUM 1 % TD GEL: 1.0000 "application " | Freq: Three times a day (TID) | TRANSDERMAL | Status: DC

## 2014-12-26 NOTE — Telephone Encounter (Signed)
PA for Volatren Gel in your IN box for completion.

## 2014-12-26 NOTE — Assessment & Plan Note (Addendum)
Anticipate flare of osteoarthritis + lateral meniscal injury. Treat with voltaren topical gel, ice, rest, brace, elevation.  No erythema and good ROM points against gout or infectious cause. Discussed steroid shot option, pt will let me know if desires this if no improvement. If worsening, given longstanding nature of condition, consider MRI to further evaluate lat meniscus. Pt agrees with plan.  Reviewed 05/2014 xray.

## 2014-12-26 NOTE — Progress Notes (Signed)
Pre visit review using our clinic review tool, if applicable. No additional management support is needed unless otherwise documented below in the visit note. 

## 2014-12-26 NOTE — Addendum Note (Signed)
Addended by: Eustaquio BoydenGUTIERREZ, Bracha Frankowski on: 12/26/2014 01:28 PM   Modules accepted: Kipp BroodSmartSet

## 2014-12-26 NOTE — Patient Instructions (Signed)
I am suspicious for knee arthritis or meniscal injury. continue elevation of leg, ice, knee brace, rest.  Treat with voltaren gel two to three times daily to right knee for next 1-2 weeks and update us if not improving - would consider steroid shot.  Continue tylenol 500mg  three times daily.

## 2014-12-26 NOTE — Progress Notes (Signed)
BP 136/82 mmHg  Pulse 84  Temp(Src) 98.6 F (37 C) (Oral)  Wt 182 lb 4 oz (82.668 kg)   CC: check knee  Subjective:    Patient ID: Jordan HorsemanJean C Kinlaw, female    DOB: [birth year], 74 y.o.   MRN: [id number]  HPI: Jordan Patterson is a 74 y.o. female presenting on 12/26/2014 for Knee Pain   2d h/o R knee pain and instability. Knee has not given out or locked in place. Points to lateral knee. Knee pain radiates down to ankle. Denies inciting trauma/injury to knee.  Self treating with ice which helps. Taking tylenol 4 pills daily. Denies swelling of leg. No h/o gout.  She did have injury 12/2014 direct blow to kneecap after short fall, treated with tylenol and PT - improved.  At that time I thought there was mild meniscal injury + xray-proven osteoarthritis.  Relevant past medical, surgical, family and social history reviewed and updated as indicated. Interim medical history since our last visit reviewed. Allergies and medications reviewed and updated. Current Outpatient Prescriptions on File Prior to Visit  Medication Sig  . albuterol (VENTOLIN HFA) 108 (90 BASE) MCG/ACT inhaler Inhale 2 puffs into the lungs every 6 (six) hours as needed for wheezing or shortness of breath.  . calcitonin, salmon, (MIACALCIN/FORTICAL) 200 UNIT/ACT nasal spray Place 1 spray into alternate nostrils daily.  . Coenzyme Q10 (COQ10) 100 MG CAPS Take 100 mg by mouth daily.   Marland Kitchen. EPINEPHrine 0.3 mg/0.3 mL IJ SOAJ injection Inject 0.3 mLs (0.3 mg total) into the muscle once. (Patient taking differently: Inject 0.3 mg into the muscle as needed (allergic reaction). )  . Garlic (GARLIQUE PO) Take 1 tablet by mouth daily.  . Glucosamine-Chondroitin (OSTEO BI-FLEX REGULAR STRENGTH PO) Take 2 tablets by mouth daily with supper.  Marland Kitchen. glucose blood test strip ONE TOUCH ULTRA MINI Use as instructed to check sugar 2-3 daily. Dx: 250.00  . levocetirizine (XYZAL) 5 MG tablet TAKE ONE TABLET BY MOUTH EACH EVENING  . losartan (COZAAR) 50 MG  tablet Take 1 tablet (50 mg total) by mouth daily.  . Magnesium 250 MG TABS Take 250 mg by mouth daily.  . Melatonin 5 MG TABS Take 5 mg by mouth at bedtime.   . metFORMIN (GLUCOPHAGE) 500 MG tablet Take 1 tablet (500 mg total) by mouth daily with breakfast.  . montelukast (SINGULAIR) 10 MG tablet Take 1 tablet (10 mg total) by mouth at bedtime.  . Multiple Vitamin (MULTIVITAMIN WITH MINERALS) TABS tablet Take 1 tablet by mouth daily.  . Multiple Vitamins-Minerals (EYE VITAMINS) CAPS Take 1 capsule by mouth daily.  . naproxen sodium (ANAPROX) 220 MG tablet Take 440 mg by mouth at bedtime.  . NON FORMULARY Allergy injections  . NONFORMULARY OR COMPOUNDED ITEM Allergy Vaccine 1:50 Given at [facility]  . omeprazole (PRILOSEC) 40 MG capsule Take 1 capsule (40 mg total) by mouth 2 (two) times daily.  . Polyvinyl Alcohol-Povidone (REFRESH OP) Place 1 drop into both eyes daily.   Marland Kitchen. SYNTHROID 100 MCG tablet Take 1 tablet (100 mcg total) by mouth daily before breakfast.  . triamcinolone (NASACORT ALLERGY 24HR) 55 MCG/ACT AERO nasal inhaler Place 1 spray into both nostrils daily as needed (congestion).   . triamterene-hydrochlorothiazide (DYAZIDE) 37.5-25 MG capsule TAKE ONE CAPSULE BY MOUTH EVERY MORNING  . Vitamin D, Cholecalciferol, 400 UNITS CAPS Take 1 capsule by mouth daily.   No current facility-administered medications on file prior to visit.    Review of Systems  Per HPI unless specifically indicated in ROS section     Objective:    BP 136/82 mmHg  Pulse 84  Temp(Src) 98.6 F (37 C) (Oral)  Wt 182 lb 4 oz (82.668 kg)  Wt Readings from Last 3 Encounters:  12/26/14 182 lb 4 oz (82.668 kg)  11/18/14 182 lb 12 oz (82.895 kg)  10/03/14 181 lb (82.101 kg)    Physical Exam  Constitutional: She appears well-developed and well-nourished. No distress.  Musculoskeletal: She exhibits edema.  L knee with mild crepitus to extension otherwise normal R Knee exam: No deformity on  inspection. + pain with palpation of lateral knee + effusion/swelling FROM in flex/extension with crepitus. No popliteal fullness. Neg drawer test. + mcmurray test. No pain with valgus/varus stress. No PFgrind. No abnormal patellar mobility.  Skin: Skin is warm and dry. No rash noted. No erythema.  Psychiatric: She has a normal mood and affect.  Nursing note and vitals reviewed.   RIGHT KNEE - COMPLETE 4+ VIEW COMPARISON: None.  FINDINGS:  The bones of the right knee are adequately mineralized. There is beaking of the tibial spines. The joint spaces are preserved. There are tiny spurs arising from the superior and inferior articular margins of the patella. There is a small joint effusion. There is no acute or old fracture nor dislocation. IMPRESSION: There are mild osteoarthritic changes involving all 3 joint compartments without significant joint space narrowing. There is no acute bony abnormality. Electronically Signed  By: David Swaziland  On: 05/30/2014 12:42     Assessment & Plan:   Problem List Items Addressed This Visit    Right knee pain - Primary    Anticipate flare of osteoarthritis + lateral meniscal injury. Treat with voltaren topical gel, ice, rest, brace, elevation.  No erythema and good ROM points against gout or infectious cause. Discussed steroid shot option, pt will let me know if desires this if no improvement. If worsening, given longstanding nature of condition, consider MRI to further evaluate lat meniscus. Pt agrees with plan.          Follow up plan: Return if symptoms worsen or fail to improve.

## 2014-12-26 NOTE — Telephone Encounter (Signed)
Ok to send. Placed in Kims' box

## 2014-12-26 NOTE — Telephone Encounter (Signed)
PA faxed. Will await determination. 

## 2014-12-27 NOTE — Telephone Encounter (Signed)
PA approved. Pharmacy notified 

## 2014-12-31 ENCOUNTER — Encounter: Payer: Self-pay | Admitting: Family Medicine

## 2015-01-01 ENCOUNTER — Ambulatory Visit (INDEPENDENT_AMBULATORY_CARE_PROVIDER_SITE_OTHER): Payer: Medicare Other

## 2015-01-01 DIAGNOSIS — J309 Allergic rhinitis, unspecified: Secondary | ICD-10-CM | POA: Diagnosis not present

## 2015-01-01 NOTE — Telephone Encounter (Signed)
Please see Mychart message.

## 2015-01-01 NOTE — Telephone Encounter (Signed)
plz call to schedule knee injection for patient.

## 2015-01-08 ENCOUNTER — Ambulatory Visit: Payer: Medicare Other

## 2015-01-10 ENCOUNTER — Encounter: Payer: Self-pay | Admitting: Internal Medicine

## 2015-01-13 ENCOUNTER — Telehealth: Payer: Self-pay | Admitting: Pulmonary Disease

## 2015-01-13 NOTE — Telephone Encounter (Signed)
Dr Craige CottaSood, please advise if you want to refill the patient's Singulair or if you would like Dr Maple HudsonYoung to take over refilling since he sees him for allergies? Thanks.

## 2015-01-14 ENCOUNTER — Ambulatory Visit (INDEPENDENT_AMBULATORY_CARE_PROVIDER_SITE_OTHER): Payer: Medicare Other

## 2015-01-14 DIAGNOSIS — J309 Allergic rhinitis, unspecified: Secondary | ICD-10-CM

## 2015-01-15 ENCOUNTER — Encounter: Payer: Self-pay | Admitting: Internal Medicine

## 2015-01-20 NOTE — Telephone Encounter (Addendum)
Pt last seen by Dr Craige CottaSood in 2015 - Pt was referred to Dr Maple HudsonYoung for management of Allergies and Asthma/COPD Pt to follow up with Dr Maple HudsonYoung. Upcoming visit with CY 02/04/15. Refills of Singulair were already sent to pharmacy on 01/13/15 under [facility].  All future refills of this medication need to go to Dr Maple HudsonYoung.  Nothing further needed.

## 2015-01-20 NOTE — Telephone Encounter (Signed)
Please check with pt if she plans to f/u with me or Dr. Maple HudsonYoung.  Then can place refill order under provider she plans to f/u with.

## 2015-01-21 ENCOUNTER — Ambulatory Visit: Payer: Medicare Other

## 2015-01-22 ENCOUNTER — Ambulatory Visit (INDEPENDENT_AMBULATORY_CARE_PROVIDER_SITE_OTHER): Payer: Medicare Other

## 2015-01-22 DIAGNOSIS — J309 Allergic rhinitis, unspecified: Secondary | ICD-10-CM | POA: Diagnosis not present

## 2015-01-29 ENCOUNTER — Ambulatory Visit (INDEPENDENT_AMBULATORY_CARE_PROVIDER_SITE_OTHER): Payer: Medicare Other

## 2015-01-29 DIAGNOSIS — J309 Allergic rhinitis, unspecified: Secondary | ICD-10-CM | POA: Diagnosis not present

## 2015-02-02 ENCOUNTER — Other Ambulatory Visit: Payer: Self-pay | Admitting: Family Medicine

## 2015-02-02 DIAGNOSIS — I10 Essential (primary) hypertension: Secondary | ICD-10-CM

## 2015-02-02 DIAGNOSIS — M8080XD Other osteoporosis with current pathological fracture, unspecified site, subsequent encounter for fracture with routine healing: Secondary | ICD-10-CM

## 2015-02-02 DIAGNOSIS — E039 Hypothyroidism, unspecified: Secondary | ICD-10-CM

## 2015-02-02 DIAGNOSIS — E785 Hyperlipidemia, unspecified: Secondary | ICD-10-CM

## 2015-02-02 DIAGNOSIS — E119 Type 2 diabetes mellitus without complications: Secondary | ICD-10-CM

## 2015-02-03 ENCOUNTER — Other Ambulatory Visit (INDEPENDENT_AMBULATORY_CARE_PROVIDER_SITE_OTHER): Payer: Medicare Other

## 2015-02-03 DIAGNOSIS — E039 Hypothyroidism, unspecified: Secondary | ICD-10-CM

## 2015-02-03 DIAGNOSIS — I10 Essential (primary) hypertension: Secondary | ICD-10-CM

## 2015-02-03 DIAGNOSIS — E119 Type 2 diabetes mellitus without complications: Secondary | ICD-10-CM | POA: Diagnosis not present

## 2015-02-03 DIAGNOSIS — M8080XD Other osteoporosis with current pathological fracture, unspecified site, subsequent encounter for fracture with routine healing: Secondary | ICD-10-CM | POA: Diagnosis not present

## 2015-02-03 DIAGNOSIS — E785 Hyperlipidemia, unspecified: Secondary | ICD-10-CM

## 2015-02-03 LAB — VITAMIN D 25 HYDROXY (VIT D DEFICIENCY, FRACTURES): VITD: 46.14 ng/mL (ref 30.00–100.00)

## 2015-02-03 LAB — COMPREHENSIVE METABOLIC PANEL
ALT: 17 U/L (ref 0–35)
AST: 21 U/L (ref 0–37)
Albumin: 4.2 g/dL (ref 3.5–5.2)
Alkaline Phosphatase: 68 U/L (ref 39–117)
BUN: 18 mg/dL (ref 6–23)
CO2: 29 mEq/L (ref 19–32)
Calcium: 9.5 mg/dL (ref 8.4–10.5)
Chloride: 101 mEq/L (ref 96–112)
Creatinine, Ser: 0.91 mg/dL (ref 0.40–1.20)
GFR: 64.18 mL/min (ref >60.00)
Glucose, Bld: 155 mg/dL — ABNORMAL HIGH (ref 70–99)
Potassium: 4.1 mEq/L (ref 3.5–5.1)
Sodium: 140 mEq/L (ref 135–145)
Total Bilirubin: 0.5 mg/dL (ref 0.2–1.2)
Total Protein: 7 g/dL (ref 6.0–8.3)

## 2015-02-03 LAB — LIPID PANEL
Cholesterol: 256 mg/dL — ABNORMAL HIGH (ref 0–200)
HDL: 44.1 mg/dL (ref >39.00)
LDL Cholesterol: 172 mg/dL — ABNORMAL HIGH (ref 0–99)
NonHDL: 211.68
Total CHOL/HDL Ratio: 6
Triglycerides: 197 mg/dL — ABNORMAL HIGH (ref 0.0–149.0)
VLDL: 39.4 mg/dL (ref 0.0–40.0)

## 2015-02-03 LAB — HEMOGLOBIN A1C: Hgb A1c MFr Bld: 7 % — ABNORMAL HIGH (ref 4.6–6.5)

## 2015-02-03 LAB — TSH: TSH: 3.24 u[IU]/mL (ref 0.35–4.50)

## 2015-02-03 LAB — T4, FREE: Free T4: 1.01 ng/dL (ref 0.60–1.60)

## 2015-02-04 ENCOUNTER — Ambulatory Visit (INDEPENDENT_AMBULATORY_CARE_PROVIDER_SITE_OTHER): Payer: Medicare Other | Admitting: Internal Medicine

## 2015-02-04 ENCOUNTER — Ambulatory Visit (INDEPENDENT_AMBULATORY_CARE_PROVIDER_SITE_OTHER): Payer: Medicare Other

## 2015-02-04 ENCOUNTER — Encounter: Payer: Self-pay | Admitting: Internal Medicine

## 2015-02-04 VITALS — BP 122/80 | HR 86 | Ht 68.5 in | Wt 179.0 lb

## 2015-02-04 DIAGNOSIS — H1013 Acute atopic conjunctivitis, bilateral: Secondary | ICD-10-CM

## 2015-02-04 DIAGNOSIS — J309 Allergic rhinitis, unspecified: Secondary | ICD-10-CM

## 2015-02-04 MED ORDER — LEVALBUTEROL HCL 0.63 MG/3ML IN NEBU
0.6300 mg | INHALATION_SOLUTION | Freq: Once | RESPIRATORY_TRACT | Status: AC
  Administered 2015-02-04: 0.63 mg via RESPIRATORY_TRACT

## 2015-02-04 MED ORDER — METHYLPREDNISOLONE ACETATE 80 MG/ML IJ SUSP
80.0000 mg | Freq: Once | INTRAMUSCULAR | Status: AC
  Administered 2015-02-04: 80 mg via INTRAMUSCULAR

## 2015-02-04 NOTE — Patient Instructions (Addendum)
Neb xop 0.63  Depo 80  Sample Breo Ellipta 100    Inhale 1 puff, then rinse mouth, once daily  We can continue allergy vaccine 1:10 GH another year  Ok to go every 2 weeks- discuss with Tammy  Please call as needed

## 2015-02-04 NOTE — Progress Notes (Signed)
11/29/13- 89 yoF never smoker followed here by Dr Craige CottaSood FOLLOW FOR:  Allergy consult; has been seeing Dr. Metaline CallasSharma, but wanted to find someone closer to her home. Referred to Dr. Maple HudsonYoung by Dr. Craige CottaSood History of allergic rhinitis/hay fever which was originally worse in the fall but is now perennial. Allergy skin testing 09/19/2012 by Dr. Collene MaresSharma-positive for dust mite, cockroach, tree pollens, grass pollens, cat She began allergy vaccine in August of 2014 and feels that has helped, with no adverse reactions. She anticipates continuing allergy vaccine therapy. She has not had adequate control with Nasacort, Xyzal,  Her asthma is managed by Dr. Craige CottaSood with albuterol , Singulair.  She denies history of adverse reactions to insect stings or specific foods and does not have a significant history of cutaneous allergy. GERD is managed with omeprazole twice daily but she still is aware of reflux events.  02/05/14-73 yoF never smoker followed here by Dr Craige CottaSood FOLLOWS FOR: Pt states she is doing well overall with allergies. Continues allergy vaccine. Denies any wheeze. Has a rescue inhaler, not needed. Using Nasacort. Feeling some pressure right ear without loss of hearing.  02/04/15- 74 year old female never smoker followed for allergic rhinitis, asthma, complicated by GERD, hypothyroid, DM 2 Allergy Vaccine FOLLOWS FOR: Pt c/o increased wheezing and coughing. SOB at times. Tolerating allergy vaccines well.     ROS-see HPI Constitutional:   No-   weight loss, night sweats, fevers, chills, fatigue, lassitude. HEENT:   No-  headaches, difficulty swallowing, tooth/dental problems, sore throat,       +sneezing, itching, ear ache, nasal congestion, post nasal drip,  CV:  No-   chest pain, orthopnea, PND, swelling in lower extremities, anasarca,                                                          dizziness, palpitations Resp: No-   shortness of breath with exertion or at rest.              No-   productive  cough,  No non-productive cough,  No- coughing up of blood.              No-   change in color of mucus.+ wheezing.   Skin: No-   rash or lesions. GI:  +  heartburn, indigestion, No-abdominal pain, nausea, vomiting, GU:  MS:  No-   joint pain or swelling.   Neuro-     nothing unusual Psych:  No- change in mood or affect. No depression or anxiety.  No memory loss.  OBJ- Physical Exam General- Alert, Oriented, Affect-appropriate, Distress- none acute Skin- rash-none, lesions- none, excoriation- none Lymphadenopathy- none Head- atraumatic            Eyes- Gross vision intact, PERRLA, conjunctivae and secretions clear            Ears- + cerumen right ear            Nose- Clear, no-Septal dev, mucus, polyps, erosion, perforation             Throat- Mallampati II , mucosa clear , drainage+white, tonsils- atrophic Neck- flexible , trachea midline, no stridor , thyroid nl, carotid no bruit Chest - symmetrical excursion , unlabored           Heart/CV- RRR , no murmur ,  no gallop  , no rub, nl s1 s2                           - JVD- none , edema- none, stasis changes- none, varices- none           Lung- clear to P&A, wheeze- none, cough- none , dullness-none, rub- none           Chest wall-  Abd-  Br/ Gen/ Rectal- Not done, not indicated Extrem- cyanosis- none, clubbing, none, atrophy- none, strength- nl Neuro- grossly intact to observation

## 2015-02-05 ENCOUNTER — Ambulatory Visit: Payer: Medicare Other | Admitting: Interventional Cardiology

## 2015-02-05 ENCOUNTER — Encounter: Payer: Medicare Other | Admitting: Family Medicine

## 2015-02-07 ENCOUNTER — Encounter: Payer: Self-pay | Admitting: Family Medicine

## 2015-02-07 ENCOUNTER — Ambulatory Visit (INDEPENDENT_AMBULATORY_CARE_PROVIDER_SITE_OTHER): Payer: Medicare Other | Admitting: Family Medicine

## 2015-02-07 VITALS — BP 146/82 | HR 68 | Temp 97.9°F | Ht 68.0 in | Wt 179.8 lb

## 2015-02-07 DIAGNOSIS — Z0000 Encounter for general adult medical examination without abnormal findings: Secondary | ICD-10-CM

## 2015-02-07 DIAGNOSIS — Z1211 Encounter for screening for malignant neoplasm of colon: Secondary | ICD-10-CM

## 2015-02-07 DIAGNOSIS — S22080S Wedge compression fracture of T11-T12 vertebra, sequela: Secondary | ICD-10-CM

## 2015-02-07 DIAGNOSIS — I10 Essential (primary) hypertension: Secondary | ICD-10-CM

## 2015-02-07 DIAGNOSIS — E039 Hypothyroidism, unspecified: Secondary | ICD-10-CM

## 2015-02-07 DIAGNOSIS — E785 Hyperlipidemia, unspecified: Secondary | ICD-10-CM

## 2015-02-07 DIAGNOSIS — H9193 Unspecified hearing loss, bilateral: Secondary | ICD-10-CM

## 2015-02-07 DIAGNOSIS — K219 Gastro-esophageal reflux disease without esophagitis: Secondary | ICD-10-CM

## 2015-02-07 DIAGNOSIS — E119 Type 2 diabetes mellitus without complications: Secondary | ICD-10-CM

## 2015-02-07 DIAGNOSIS — J309 Allergic rhinitis, unspecified: Secondary | ICD-10-CM

## 2015-02-07 DIAGNOSIS — M8080XD Other osteoporosis with current pathological fracture, unspecified site, subsequent encounter for fracture with routine healing: Secondary | ICD-10-CM

## 2015-02-07 DIAGNOSIS — Z7189 Other specified counseling: Secondary | ICD-10-CM

## 2015-02-07 DIAGNOSIS — R5382 Chronic fatigue, unspecified: Secondary | ICD-10-CM

## 2015-02-07 DIAGNOSIS — H1013 Acute atopic conjunctivitis, bilateral: Secondary | ICD-10-CM

## 2015-02-07 NOTE — Assessment & Plan Note (Signed)
Preventative protocols reviewed and updated unless pt declined. Discussed healthy diet and lifestyle.  

## 2015-02-07 NOTE — Assessment & Plan Note (Signed)
Chronic, stable. Continue current regimen. 

## 2015-02-07 NOTE — Progress Notes (Signed)
Pre visit review using our clinic review tool, if applicable. No additional management support is needed unless otherwise documented below in the visit note. 

## 2015-02-07 NOTE — Assessment & Plan Note (Signed)
Advanced directives: has living will at home. Daughter is HCPOA. In chart (02/2013) 

## 2015-02-07 NOTE — Assessment & Plan Note (Signed)
Asxs, continues omeprazole 40mg  BID.

## 2015-02-07 NOTE — Assessment & Plan Note (Signed)
Chronic, mildly elevated today. Continue to monitor on losartan 50mg  daily.

## 2015-02-07 NOTE — Patient Instructions (Addendum)
Pass by lab to pick up stool kit. Call to schedule nurse visit for prevnar after the new year.  Touch base with Dr Tamala Julian about cholesterol medicine options. Likely may recommend another statin trial.  You are doing well today.  Return as needed or in 6 months for follow up visit.  Health Maintenance, Female Adopting a healthy lifestyle and getting preventive care can go a long way to promote health and wellness. Talk with your health care provider about what schedule of regular examinations is right for you. This is a good chance for you to check in with your provider about disease prevention and staying healthy. In between checkups, there are plenty of things you can do on your own. Experts have done a lot of research about which lifestyle changes and preventive measures are most likely to keep you healthy. Ask your health care provider for more information. WEIGHT AND DIET  Eat a healthy diet  Be sure to include plenty of vegetables, fruits, low-fat dairy products, and lean protein.  Do not eat a lot of foods high in solid fats, added sugars, or salt.  Get regular exercise. This is one of the most important things you can do for your health.  Most adults should exercise for at least 150 minutes each week. The exercise should increase your heart rate and make you sweat (moderate-intensity exercise).  Most adults should also do strengthening exercises at least twice a week. This is in addition to the moderate-intensity exercise.  Maintain a healthy weight  Body mass index (BMI) is a measurement that can be used to identify possible weight problems. It estimates body fat based on height and weight. Your health care provider can help determine your BMI and help you achieve or maintain a healthy weight.  For females 13 years of age and older:   A BMI below 18.5 is considered underweight.  A BMI of 18.5 to 24.9 is normal.  A BMI of 25 to 29.9 is considered overweight.  A BMI of 30 and  above is considered obese.  Watch levels of cholesterol and blood lipids  You should start having your blood tested for lipids and cholesterol at 74 years of age, then have this test every 5 years.  You may need to have your cholesterol levels checked more often if:  Your lipid or cholesterol levels are high.  You are older than 74 years of age.  You are at high risk for heart disease.  CANCER SCREENING   Lung Cancer  Lung cancer screening is recommended for adults 63-24 years old who are at high risk for lung cancer because of a history of smoking.  A yearly low-dose CT scan of the lungs is recommended for people who:  Currently smoke.  Have quit within the past 15 years.  Have at least a 30-pack-year history of smoking. A pack year is smoking an average of one pack of cigarettes a day for 1 year.  Yearly screening should continue until it has been 15 years since you quit.  Yearly screening should stop if you develop a health problem that would prevent you from having lung cancer treatment.  Breast Cancer  Practice breast self-awareness. This means understanding how your breasts normally appear and feel.  It also means doing regular breast self-exams. Let your health care provider know about any changes, no matter how small.  If you are in your 20s or 30s, you should have a clinical breast exam (CBE) by a health care  provider every 1-3 years as part of a regular health exam.  If you are 40 or older, have a CBE every year. Also consider having a breast X-ray (mammogram) every year.  If you have a family history of breast cancer, talk to your health care provider about genetic screening.  If you are at high risk for breast cancer, talk to your health care provider about having an MRI and a mammogram every year.  Breast cancer gene (BRCA) assessment is recommended for women who have family members with BRCA-related cancers. BRCA-related cancers  include:  Breast.  Ovarian.  Tubal.  Peritoneal cancers.  Results of the assessment will determine the need for genetic counseling and BRCA1 and BRCA2 testing. Cervical Cancer Your health care provider may recommend that you be screened regularly for cancer of the pelvic organs (ovaries, uterus, and vagina). This screening involves a pelvic examination, including checking for microscopic changes to the surface of your cervix (Pap test). You may be encouraged to have this screening done every 3 years, beginning at age 21.  For women ages 30-65, health care providers may recommend pelvic exams and Pap testing every 3 years, or they may recommend the Pap and pelvic exam, combined with testing for human papilloma virus (HPV), every 5 years. Some types of HPV increase your risk of cervical cancer. Testing for HPV may also be done on women of any age with unclear Pap test results.  Other health care providers may not recommend any screening for nonpregnant women who are considered low risk for pelvic cancer and who do not have symptoms. Ask your health care provider if a screening pelvic exam is right for you.  If you have had past treatment for cervical cancer or a condition that could lead to cancer, you need Pap tests and screening for cancer for at least 20 years after your treatment. If Pap tests have been discontinued, your risk factors (such as having a new sexual partner) need to be reassessed to determine if screening should resume. Some women have medical problems that increase the chance of getting cervical cancer. In these cases, your health care provider may recommend more frequent screening and Pap tests. Colorectal Cancer  This type of cancer can be detected and often prevented.  Routine colorectal cancer screening usually begins at 74 years of age and continues through 75 years of age.  Your health care provider may recommend screening at an earlier age if you have risk factors for  colon cancer.  Your health care provider may also recommend using home test kits to check for hidden blood in the stool.  A small camera at the end of a tube can be used to examine your colon directly (sigmoidoscopy or colonoscopy). This is done to check for the earliest forms of colorectal cancer.  Routine screening usually begins at age 50.  Direct examination of the colon should be repeated every 5-10 years through 75 years of age. However, you may need to be screened more often if early forms of precancerous polyps or small growths are found. Skin Cancer  Check your skin from head to toe regularly.  Tell your health care provider about any new moles or changes in moles, especially if there is a change in a mole's shape or color.  Also tell your health care provider if you have a mole that is larger than the size of a pencil eraser.  Always use sunscreen. Apply sunscreen liberally and repeatedly throughout the day.  Protect yourself   by wearing long sleeves, pants, a wide-brimmed hat, and sunglasses whenever you are outside. HEART DISEASE, DIABETES, AND HIGH BLOOD PRESSURE   High blood pressure causes heart disease and increases the risk of stroke. High blood pressure is more likely to develop in:  People who have blood pressure in the high end of the normal range (130-139/85-89 mm Hg).  People who are overweight or obese.  People who are African American.  If you are 69-2 years of age, have your blood pressure checked every 3-5 years. If you are 13 years of age or older, have your blood pressure checked every year. You should have your blood pressure measured twice--once when you are at a hospital or clinic, and once when you are not at a hospital or clinic. Record the average of the two measurements. To check your blood pressure when you are not at a hospital or clinic, you can use:  An automated blood pressure machine at a pharmacy.  A home blood pressure monitor.  If you  are between 66 years and 34 years old, ask your health care provider if you should take aspirin to prevent strokes.  Have regular diabetes screenings. This involves taking a blood sample to check your fasting blood sugar level.  If you are at a normal weight and have a low risk for diabetes, have this test once every three years after 74 years of age.  If you are overweight and have a high risk for diabetes, consider being tested at a younger age or more often. PREVENTING INFECTION  Hepatitis B  If you have a higher risk for hepatitis B, you should be screened for this virus. You are considered at high risk for hepatitis B if:  You were born in a country where hepatitis B is common. Ask your health care provider which countries are considered high risk.  Your parents were born in a high-risk country, and you have not been immunized against hepatitis B (hepatitis B vaccine).  You have HIV or AIDS.  You use needles to inject street drugs.  You live with someone who has hepatitis B.  You have had sex with someone who has hepatitis B.  You get hemodialysis treatment.  You take certain medicines for conditions, including cancer, organ transplantation, and autoimmune conditions. Hepatitis C  Blood testing is recommended for:  Everyone born from [birth year] through 1965.  Anyone with known risk factors for hepatitis C. Sexually transmitted infections (STIs)  You should be screened for sexually transmitted infections (STIs) including gonorrhea and chlamydia if:  You are sexually active and are younger than 74 years of age.  You are older than 74 years of age and your health care provider tells you that you are at risk for this type of infection.  Your sexual activity has changed since you were last screened and you are at an increased risk for chlamydia or gonorrhea. Ask your health care provider if you are at risk.  If you do not have HIV, but are at risk, it may be recommended that you  take a prescription medicine daily to prevent HIV infection. This is called pre-exposure prophylaxis (PrEP). You are considered at risk if:  You are sexually active and do not regularly use condoms or know the HIV status of your partner(s).  You take drugs by injection.  You are sexually active with a partner who has HIV. Talk with your health care provider about whether you are at high risk of being infected with HIV.  If you choose to begin PrEP, you should first be tested for HIV. You should then be tested every 3 months for as long as you are taking PrEP.  PREGNANCY   If you are premenopausal and you may become pregnant, ask your health care provider about preconception counseling.  If you may become pregnant, take 400 to 800 micrograms (mcg) of folic acid every day.  If you want to prevent pregnancy, talk to your health care provider about birth control (contraception). OSTEOPOROSIS AND MENOPAUSE   Osteoporosis is a disease in which the bones lose minerals and strength with aging. This can result in serious bone fractures. Your risk for osteoporosis can be identified using a bone density scan.  If you are 28 years of age or older, or if you are at risk for osteoporosis and fractures, ask your health care provider if you should be screened.  Ask your health care provider whether you should take a calcium or vitamin D supplement to lower your risk for osteoporosis.  Menopause may have certain physical symptoms and risks.  Hormone replacement therapy may reduce some of these symptoms and risks. Talk to your health care provider about whether hormone replacement therapy is right for you.  HOME CARE INSTRUCTIONS   Schedule regular health, dental, and eye exams.  Stay current with your immunizations.   Do not use any tobacco products including cigarettes, chewing tobacco, or electronic cigarettes.  If you are pregnant, do not drink alcohol.  If you are breastfeeding, limit how  much and how often you drink alcohol.  Limit alcohol intake to no more than 1 drink per day for nonpregnant women. One drink equals 12 ounces of beer, 5 ounces of wine, or 1 ounces of hard liquor.  Do not use street drugs.  Do not share needles.  Ask your health care provider for help if you need support or information about quitting drugs.  Tell your health care provider if you often feel depressed.  Tell your health care provider if you have ever been abused or do not feel safe at home.   This information is not intended to replace advice given to you by your health care provider. Make sure you discuss any questions you have with your health care provider.   Document Released: 08/24/2010 Document Revised: 03/01/2014 Document Reviewed: 01/10/2013 Elsevier Interactive Patient Education [employer].

## 2015-02-07 NOTE — Assessment & Plan Note (Signed)
Ongoing. Discussed possible full retirement.

## 2015-02-07 NOTE — Assessment & Plan Note (Addendum)
Rpt DEXA 2016 with osteopenia. Continues calcitonin started summer 2016. Compliant with vitamin D.

## 2015-02-07 NOTE — Assessment & Plan Note (Signed)

## 2015-02-07 NOTE — Assessment & Plan Note (Signed)
Reviewed recent labs with patient, discussed options including PCSK9 inhibitors. Again recommended lower potency statin (intolerance to crestor). Pt will discuss with Dr Katrinka BlazingSmith at upcoming appt.

## 2015-02-07 NOTE — Assessment & Plan Note (Signed)
Chronic, stable on low dose metformin. Continue to monitor.

## 2015-02-07 NOTE — Assessment & Plan Note (Addendum)
Using back brace. Kyphoplasty by Dr Newell CoralNudelman 08/2014.

## 2015-02-07 NOTE — Progress Notes (Signed)
BP 146/82 mmHg  Pulse 68  Temp(Src) 97.9 F (36.6 C) (Oral)  Ht 5' 8" (1.727 m)  Wt 179 lb 12 oz (81.534 kg)  BMI 27.34 kg/m2   CC: medicare wellness visit  Subjective:    Patient ID: Jordan Patterson, female    DOB: Jul 17, 1940, 74 y.o.   MRN: [id number]  HPI: Jordan Patterson is a 74 y.o. female presenting on 02/07/2015 for Annual Exam   Recently saw Dr Annamaria Boots for allergy shots and asthma flare.   Hearing screen borderline. Has seen audiologist in past. Did not need hearing aides at that time. Told not severe hearing loss. Will see [facility] audiologist. Vision exam with eye doctor 11/2014, followed for macular degeneration. Denies depression/anxiety. Denies anhedonia.  1 fall with compression fracture.   Preventative: Colonoscopy - per pt around 2007 with Dr. Norwood Levo, per pt normal. Stool kit today. Mammogram - 09/2014 Bir[facility]1. Requests Q2 yr mammograms Pap - always normal pap smear/pelvic exams. Established with Dr Esaw Grandchild [facility].  Dexa 2016 osteopenia. Fracture however has classified her as osteoporosis.  Flu shot done Pneumovax - 2007, prevnar - will return for this. Tetanus - 2013 Shingles shot - states had shingles. Declines shingles shot.  Advanced directives: has living will at home. Daughter is HCPOA. In chart (02/2013) Seat belt and sunscreen use discussed.  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 Interim medical history since our last visit reviewed. Allergies and medications reviewed and updated. Current Outpatient Prescriptions on File Prior to Visit  Medication Sig  . albuterol (VENTOLIN HFA) 108 (90 BASE) MCG/ACT inhaler Inhale 2 puffs into the lungs every 6 (six) hours  as needed for wheezing or shortness of breath.  . calcitonin, salmon, (MIACALCIN/FORTICAL) 200 UNIT/ACT nasal spray Place 1 spray into alternate nostrils daily.  . Coenzyme Q10 (COQ10) 100 MG CAPS Take 100 mg by mouth daily.   . diclofenac sodium (VOLTAREN) 1 % GEL Apply 1 application topically 3 (three) times daily.  Marland Kitchen EPINEPHrine 0.3 mg/0.3 mL IJ SOAJ injection Inject 0.3 mLs (0.3 mg total) into the muscle once. (Patient taking differently: Inject 0.3 mg into the muscle as needed (allergic reaction). )  . Garlic (GARLIQUE PO) Take 1 tablet by mouth daily.  . Glucosamine-Chondroitin (OSTEO BI-FLEX REGULAR STRENGTH PO) Take 2 tablets by mouth daily with supper.  Marland Kitchen glucose blood test strip ONE TOUCH ULTRA MINI Use as instructed to check sugar 2-3 daily. Dx: 250.00  . levocetirizine (XYZAL) 5 MG tablet TAKE ONE TABLET BY MOUTH EACH EVENING  . losartan (COZAAR) 50 MG tablet Take 1 tablet (50 mg total) by mouth daily.  . Magnesium 250 MG TABS Take 250 mg by mouth daily.  . Melatonin 5 MG TABS Take 5 mg by mouth at bedtime.   . metFORMIN (GLUCOPHAGE) 500 MG tablet Take 1 tablet (500 mg total) by mouth daily with breakfast.  . montelukast (SINGULAIR) 10 MG tablet TAKE ONE TABLET BY MOUTH AT BEDTIME  . Multiple Vitamin (MULTIVITAMIN WITH MINERALS) TABS tablet Take 1 tablet by mouth daily.  . Multiple Vitamins-Minerals (EYE VITAMINS) CAPS Take 1 capsule by mouth daily.  . naproxen sodium (ANAPROX) 220 MG tablet Take 440 mg by mouth at bedtime.  . NON FORMULARY Allergy  injections  . NONFORMULARY OR COMPOUNDED ITEM Allergy Vaccine 1:50 Given at [facility]  . omeprazole (PRILOSEC) 40 MG capsule Take 1 capsule (40 mg total) by mouth 2 (two) times daily.  . Polyvinyl Alcohol-Povidone (REFRESH OP) Place 1 drop into both eyes daily.   Marland Kitchen SYNTHROID 100 MCG tablet Take 1 tablet (100 mcg total) by mouth daily before breakfast.  . triamcinolone (NASACORT ALLERGY 24HR) 55 MCG/ACT AERO nasal inhaler Place 1  spray into both nostrils daily as needed (congestion).   . triamterene-hydrochlorothiazide (DYAZIDE) 37.5-25 MG capsule TAKE ONE CAPSULE BY MOUTH EVERY MORNING  . Vitamin D, Cholecalciferol, 400 UNITS CAPS Take 1 capsule by mouth daily.   No current facility-administered medications on file prior to visit.    Review of Systems  Constitutional: Negative for fever, chills, activity change, appetite change, fatigue and unexpected weight change.  HENT: Negative for hearing loss.   Eyes: Negative for visual disturbance.  Respiratory: Positive for cough (from asthma) and shortness of breath. Negative for chest tightness and wheezing.   Cardiovascular: Negative for chest pain, palpitations and leg swelling.  Gastrointestinal: Negative for nausea, vomiting, abdominal pain, diarrhea, constipation, blood in stool and abdominal distention.  Genitourinary: Negative for hematuria and difficulty urinating.  Musculoskeletal: Negative for myalgias, arthralgias and neck pain.  Skin: Negative for rash.  Neurological: Negative for dizziness, seizures, syncope and headaches.  Hematological: Negative for adenopathy. Does not bruise/bleed easily.  Psychiatric/Behavioral: Negative for dysphoric mood. The patient is not nervous/anxious.    Per HPI unless specifically indicated in ROS section     Objective:    BP 146/82 mmHg  Pulse 68  Temp(Src) 97.9 F (36.6 C) (Oral)  Ht 5' 8" (1.727 m)  Wt 179 lb 12 oz (81.534 kg)  BMI 27.34 kg/m2  Wt Readings from Last 3 Encounters:  02/07/15 179 lb 12 oz (81.534 kg)  02/04/15 179 lb (81.194 kg)  12/26/14 182 lb 4 oz (82.668 kg)    Physical Exam  Constitutional: She is oriented to person, place, and time. She appears well-developed and well-nourished. No distress.  HENT:  Head: Normocephalic and atraumatic.  Right Ear: Hearing, tympanic membrane, external ear and ear canal normal.  Left Ear: Hearing, tympanic membrane, external ear and ear canal normal.  Nose:  Nose normal.  Mouth/Throat: Uvula is midline, oropharynx is clear and moist and mucous membranes are normal. No oropharyngeal exudate, posterior oropharyngeal edema or posterior oropharyngeal erythema.  Eyes: Conjunctivae and EOM are normal. Pupils are equal, round, and reactive to light. No scleral icterus.  Neck: Normal range of motion. Neck supple. Carotid bruit is not present. No thyromegaly present.  Cardiovascular: Normal rate, regular rhythm, normal heart sounds and intact distal pulses.   No murmur heard. Pulses:      Radial pulses are 2+ on the right side, and 2+ on the left side.  Pulmonary/Chest: Effort normal and breath sounds normal. No respiratory distress. She has no wheezes. She has no rales.  Abdominal: Soft. Bowel sounds are normal. She exhibits no distension and no mass. There is no tenderness. There is no rebound and no guarding.  Musculoskeletal: Normal range of motion. She exhibits no edema.  Lymphadenopathy:    She has no cervical adenopathy.  Neurological: She is alert and oriented to person, place, and time.  CN grossly intact, station and gait intact Recall 3/3 Calculation 5/5 serial 3s  Skin: Skin is warm and dry. No rash noted.  Psychiatric: She has a normal mood and affect. Her behavior  is normal. Judgment and thought content normal.  Nursing note and vitals reviewed.  Results for orders placed or performed in visit on 02/03/15  TSH  Result Value Ref Range   TSH 3.24 0.35 - 4.50 uIU/mL  T4, free  Result Value Ref Range   Free T4 1.01 0.60 - 1.60 ng/dL  Lipid panel  Result Value Ref Range   Cholesterol 256 (H) 0 - 200 mg/dL   Triglycerides 197.0 (H) 0.0 - 149.0 mg/dL   HDL 44.10 >39.00 mg/dL   VLDL 39.4 0.0 - 40.0 mg/dL   LDL Cholesterol 172 (H) 0 - 99 mg/dL   Total CHOL/HDL Ratio 6    NonHDL 211.68   Comprehensive metabolic panel  Result Value Ref Range   Sodium 140 135 - 145 mEq/L   Potassium 4.1 3.5 - 5.1 mEq/L   Chloride 101 96 - 112 mEq/L    CO2 29 19 - 32 mEq/L   Glucose, Bld 155 (H) 70 - 99 mg/dL   BUN 18 6 - 23 mg/dL   Creatinine, Ser 0.91 0.40 - 1.20 mg/dL   Total Bilirubin 0.5 0.2 - 1.2 mg/dL   Alkaline Phosphatase 68 39 - 117 U/L   AST 21 0 - 37 U/L   ALT 17 0 - 35 U/L   Total Protein 7.0 6.0 - 8.3 g/dL   Albumin 4.2 3.5 - 5.2 g/dL   Calcium 9.5 8.4 - 10.5 mg/dL   GFR 64.18 >60.00 mL/min  Hemoglobin A1c  Result Value Ref Range   Hgb A1c MFr Bld 7.0 (H) 4.6 - 6.5 %  VITAMIN D 25 Hydroxy (Vit-D Deficiency, Fractures)  Result Value Ref Range   VITD 46.14 30.00 - 100.00 ng/mL      Assessment & Plan:   Problem List Items Addressed This Visit    Osteoporosis with fracture    Rpt DEXA 2016 with osteopenia. Continues calcitonin started summer 2016. Compliant with vitamin D.      Medicare annual wellness visit, subsequent - Primary    I have personally reviewed the Medicare Annual Wellness questionnaire and have noted 1. The patient's medical and social history 2. Their use of alcohol, tobacco or illicit drugs 3. Their current medications and supplements 4. The patient's functional ability including ADL's, fall risks, home safety risks and hearing or visual impairment. Cognitive function has been assessed and addressed as indicated.  5. Diet and physical activity 6. Evidence for depression or mood disorders The patients weight, height, BMI have been recorded in the chart. I have made referrals, counseling and provided education to the patient based on review of the above and I have provided the pt with a written personalized care plan for preventive services. Provider list updated.. See scanned questionairre as needed for further documentation. Reviewed preventative protocols and updated unless pt declined.       Hypothyroidism    Chronic, stable. Continue current regimen.      Hypertension    Chronic, mildly elevated today. Continue to monitor on losartan 72m daily.      Hyperlipidemia    Reviewed recent  labs with patient, discussed options including PCSK9 inhibitors. Again recommended lower potency statin (intolerance to crestor). Pt will discuss with Dr STamala Julianat upcoming appt.      Hearing loss    [facility]    Preventative protocols reviewed and updated unless pt declined. Discussed healthy diet and lifestyle.  GERD (gastroesophageal reflux disease)    Asxs, continues omeprazole 15m BID.      Fatigue    Ongoing. Discussed possible full retirement.      Diabetes type 2, controlled (HReading    Chronic, stable on low dose metformin. Continue to monitor.      Compression fracture of T12 vertebra (HCC)    Using back brace. Kyphoplasty by Dr NSherwood Gambler7/2016.      Allergic conjunctivitis and rhinitis    Receives allergy vaccine through pulm.      Advanced care planning/counseling discussion    Advanced directives: has living will at home. Daughter is HCPOA. In chart (02/2013)       Other Visit Diagnoses    Special screening for malignant neoplasms, colon        Relevant Orders    Fecal occult blood, imunochemical        Follow up plan: Return in about 6 months (around 08/08/2015), or as needed, for follow up visit.

## 2015-02-07 NOTE — Assessment & Plan Note (Signed)
[facility]

## 2015-02-07 NOTE — Assessment & Plan Note (Signed)
Receives allergy vaccine through pulm.

## 2015-02-10 ENCOUNTER — Encounter: Payer: Self-pay | Admitting: Family Medicine

## 2015-02-10 NOTE — Telephone Encounter (Signed)
Please see Mychart message.

## 2015-02-11 ENCOUNTER — Ambulatory Visit (INDEPENDENT_AMBULATORY_CARE_PROVIDER_SITE_OTHER): Payer: Medicare Other

## 2015-02-11 DIAGNOSIS — J309 Allergic rhinitis, unspecified: Secondary | ICD-10-CM | POA: Diagnosis not present

## 2015-02-13 NOTE — Telephone Encounter (Signed)
Has not scheduled the appt yet. Advised patient to schedule OV with you and vaccine would be given during the visit.

## 2015-02-13 NOTE — Telephone Encounter (Signed)
plz change nurse visit to office visit when pt comes in January.

## 2015-02-19 ENCOUNTER — Ambulatory Visit (INDEPENDENT_AMBULATORY_CARE_PROVIDER_SITE_OTHER): Payer: Medicare Other

## 2015-02-19 ENCOUNTER — Encounter: Payer: Self-pay | Admitting: Family Medicine

## 2015-02-19 DIAGNOSIS — J309 Allergic rhinitis, unspecified: Secondary | ICD-10-CM

## 2015-02-19 MED ORDER — BENZONATATE 100 MG PO CAPS: 100.0000 mg | ORAL_CAPSULE | Freq: Two times a day (BID) | ORAL | Status: DC | PRN

## 2015-02-19 NOTE — Telephone Encounter (Signed)
Please see Mychart message.

## 2015-02-23 HISTORY — PX: COLONOSCOPY: SHX174

## 2015-02-24 ENCOUNTER — Encounter: Payer: Self-pay | Admitting: Family Medicine

## 2015-02-24 DIAGNOSIS — H9193 Unspecified hearing loss, bilateral: Secondary | ICD-10-CM

## 2015-02-26 ENCOUNTER — Ambulatory Visit (INDEPENDENT_AMBULATORY_CARE_PROVIDER_SITE_OTHER): Payer: Medicare Other

## 2015-02-26 DIAGNOSIS — J309 Allergic rhinitis, unspecified: Secondary | ICD-10-CM | POA: Diagnosis not present

## 2015-02-26 NOTE — Telephone Encounter (Signed)
Audiologist referral placed  

## 2015-02-27 ENCOUNTER — Ambulatory Visit (INDEPENDENT_AMBULATORY_CARE_PROVIDER_SITE_OTHER): Payer: Medicare Other | Admitting: Interventional Cardiology

## 2015-02-27 ENCOUNTER — Encounter: Payer: Self-pay | Admitting: Internal Medicine

## 2015-02-27 ENCOUNTER — Encounter: Payer: Self-pay | Admitting: Interventional Cardiology

## 2015-02-27 VITALS — BP 150/94 | HR 84 | Ht 68.0 in | Wt 181.8 lb

## 2015-02-27 DIAGNOSIS — E785 Hyperlipidemia, unspecified: Secondary | ICD-10-CM | POA: Diagnosis not present

## 2015-02-27 DIAGNOSIS — I10 Essential (primary) hypertension: Secondary | ICD-10-CM | POA: Diagnosis not present

## 2015-02-27 NOTE — Patient Instructions (Signed)
Medication Instructions:  Your physician recommends that you continue on your current medications as directed. Please refer to the Current Medication list given to you today.   Labwork: Cardio IG today  Testing/Procedures: None ordered  Follow-Up: Your physician recommends that you schedule a follow-up appointment pending results   Any Other Special Instructions Will Be Listed Below (If Applicable).     If you need a refill on your cardiac medications before your next appointment, please call your pharmacy.

## 2015-02-27 NOTE — Progress Notes (Signed)
Cardiology Office Note   Date:  02/27/2015   ID:  Jordan Patterson, DOB [birth year], MRN [id number]  PCP:  Eustaquio BoydenJavier Gutierrez, MD  Cardiologist:  Lesleigh NoeSMITH III,HENRY W, MD   Chief Complaint  Patient presents with  . Hyperlipidemia      History of Present Illness: Jordan Patterson is a 75 y.o. female who presents for elevated lipids. Has hypertension and diabetes mellitus. Has history or asthma and wheezing. Remote history of T12 fracture.  Prior history of statin intolerance(Crestor) and refuses therapy. The difficulty with statins was lower extremity weakness and pain. After being off to therapy for 6 weeks she gradually improved. She has not been inclined to allow therapy with statins since that time. She is referred on this occasion because of additional risk factors including glucose intolerance, hypertension, age, and family history with a request that we make a plan to lower her lipids.  She denies angina, orthopnea, PND, edema, palpitations, syncope, headache, and prior stroke.   Past Medical History  Diagnosis Date  . Hiatal hernia   . GERD (gastroesophageal reflux disease)     [name]([place]) EGD - mild esophageal dysmotility and small hiatal hernia  . Hypertension   . Extrinsic asthma   . Hyperlipidemia     mild, diet controlled  . Hypothyroidism   . Osteoporosis with fracture 07/2014    T -1.2 hip, T 0.2 spine, T12 compression fracture s/p kyphoplasty  . Diabetes type 2, controlled (HCC) 2004    diet controlled  . Macular degeneration   . Arthritis     hands and knees - ?osteo  . Environmental allergies     dust,mold,mildew  . Hx of migraines   . Chronic headaches   . Allergic rhinitis   . Depression     pt denies  . Compression fracture of T12 vertebra (HCC) 07/18/2014    S/p kyphoplasty by Dr Newell CoralNudelman (08/2014)     Past Surgical History  Procedure Laterality Date  . Dilation and curettage of uterus  1978  . Tonsillectomy and adenoidectomy  1962  . Laparoscopy abdomen  diagnostic      To R/O endometriosis  . Esophagogastroduodenoscopy  06/25/2011    Procedure: ESOPHAGOGASTRODUODENOSCOPY (EGD);  Surgeon: Hart Carwinora M Brodie, MD;  [facility];  Service: Endoscopy;  Laterality: N/A;  no Xray  . Savory dilation  06/25/2011    Procedure: SAVORY DILATION;  Surgeon: Hart Carwinora M Brodie, MD;  Location: [facility];  Service: Endoscopy;  Laterality: N/A;  . Cataract extraction  2003    bilateral with lens implant  . Dexa  05/2008    improved (initial DEXA 2007 - mild osteopenia)  . Colonoscopy    . Kyphoplasty N/A 08/30/2014    Procedure: THORACIC TWELVE KYPHOPLASTY;  Surgeon: Shirlean Kellyobert Nudelman, MD  . [place] echocardiography  10/2014    nl systolic function EF 55%, mild diastolic dysfunction     Current Outpatient Prescriptions  Medication Sig Dispense Refill  . albuterol (VENTOLIN HFA) 108 (90 BASE) MCG/ACT inhaler Inhale 2 puffs into the lungs every 6 (six) hours as needed for wheezing or shortness of breath. 1 Inhaler 5  . aspirin 81 MG tablet Take 81 mg by mouth daily.    . benzonatate (TESSALON) 100 MG capsule Take 1 capsule (100 mg total) by mouth 2 (two) times daily as needed for cough. 30 capsule 0  . calcitonin, salmon, (MIACALCIN/FORTICAL) 200 UNIT/ACT nasal spray Place 1 spray into alternate nostrils daily. 3.7 mL 11  . Coenzyme Q10 (  COQ10) 100 MG CAPS Take 100 mg by mouth daily.     Marland Kitchen EPINEPHrine 0.3 mg/0.3 mL IJ SOAJ injection Inject 0.3 mg into the muscle as needed for anaphylaxis.    . Garlic (GARLIQUE PO) Take 1 tablet by mouth daily.    . Glucosamine-Chondroitin (OSTEO BI-FLEX REGULAR STRENGTH PO) Take 2 tablets by mouth daily with supper.    Marland Kitchen glucose blood test strip ONE TOUCH ULTRA MINI Use as instructed to check sugar 2-3 daily. Dx: 250.00 100 each 3  . levocetirizine (XYZAL) 5 MG tablet TAKE ONE TABLET BY MOUTH EACH EVENING 30 tablet 3  . losartan (COZAAR) 50 MG tablet Take 1 tablet (50 mg total) by mouth daily. 30 tablet 11  . Magnesium 250 MG TABS  Take 250 mg by mouth daily.    . Melatonin 5 MG TABS Take 5 mg by mouth at bedtime.     . metFORMIN (GLUCOPHAGE) 500 MG tablet Take 1 tablet (500 mg total) by mouth daily with breakfast. 90 tablet 3  . montelukast (SINGULAIR) 10 MG tablet TAKE ONE TABLET BY MOUTH AT BEDTIME 30 tablet 1  . Multiple Vitamin (MULTIVITAMIN WITH MINERALS) TABS tablet Take 1 tablet by mouth daily.    . Multiple Vitamins-Minerals (EYE VITAMINS) CAPS Take 1 capsule by mouth daily.    . NONFORMULARY OR COMPOUNDED ITEM Allergy Vaccine 1:50 Given at [facility]    . omeprazole (PRILOSEC) 40 MG capsule Take 1 capsule (40 mg total) by mouth 2 (two) times daily. 60 capsule 11  . Polyvinyl Alcohol-Povidone (REFRESH OP) Place 1 drop into both eyes daily.     Marland Kitchen SYNTHROID 100 MCG tablet Take 1 tablet (100 mcg total) by mouth daily before breakfast. 30 tablet 11  . triamcinolone (NASACORT ALLERGY 24HR) 55 MCG/ACT AERO nasal inhaler Place 1 spray into both nostrils daily as needed (congestion).     . triamterene-hydrochlorothiazide (DYAZIDE) 37.5-25 MG capsule TAKE ONE CAPSULE BY MOUTH EVERY MORNING 30 capsule 11  . Vitamin D, Cholecalciferol, 400 UNITS CAPS Take 1 capsule by mouth daily.     No current facility-administered medications for this visit.    Allergies:   Ace inhibitors; Amlodipine; Crestor; Pregabalin; Tegretol; Wellbutrin; and Cymbalta    Social History:  The patient  reports that she has never smoked. She has never used smokeless tobacco. She reports that she does not drink alcohol or use illicit drugs.   Family History:  The patient's family history includes Diabetes in her father; Heart failure in her maternal grandfather; Hypertension in her mother; Mitral valve prolapse in her father and mother; Other in her brother; Parkinson's disease in her brother; Stroke in her paternal grandfather; Stroke (age of onset: 69) in her mother. There is no history of Cancer.    ROS:  Please see the history of present  illness.   Otherwise, review of systems are positive for asthma, cough, vision disturbance, back discomfort, occasional wheezing. She sees Dr. Craige Cotta.   All other systems are reviewed and negative.    PHYSICAL EXAM: VS:  BP 150/94 mmHg  Pulse 84  Ht 5\' 8"  (1.727 m)  Wt 181 lb 12.8 oz (82.464 kg)  BMI 27.65 kg/m2 , BMI Body mass index is 27.65 kg/(m^2). GEN: Well nourished, well developed, in no acute distress HEENT: normal Neck: no JVD, carotid bruits, or masses Cardiac: RRR.  There is no murmur, rub, or gallop. There is no edema. Respiratory:  clear to auscultation bilaterally, normal work of breathing. GI: soft, nontender, nondistended, +  BS MS: no deformity or atrophy Skin: warm and dry, no rash Neuro:  Strength and sensation are intact Psych: euthymic mood, full affect   EKG:  EKG is ordered today. The ekg reveals normal sinus rhythm, relatively low voltage, otherwise unremarkable.   Recent Labs: 08/30/2014: Hemoglobin 13.4; Platelets 170 02/03/2015: ALT 17; BUN 18; Creatinine, Ser 0.91; Potassium 4.1; Sodium 140; TSH 3.24    Lipid Panel    Component Value Date/Time   CHOL 256* 02/03/2015 0803   TRIG 197.0* 02/03/2015 0803   TRIG 129 07/30/2010   HDL 44.10 02/03/2015 0803   CHOLHDL 6 02/03/2015 0803   VLDL 39.4 02/03/2015 0803   LDLCALC 172* 02/03/2015 0803   LDLDIRECT 154.0 05/23/2014 0849      Wt Readings from Last 3 Encounters:  02/27/15 181 lb 12.8 oz (82.464 kg)  02/07/15 179 lb 12 oz (81.534 kg)  02/04/15 179 lb (81.194 kg)      Other studies Reviewed: Additional studies/ records that were reviewed today include: Review of prior lipids.. The findings include generally speaking total cholesterol levels are in the 260 range with LDL cholesterol ranging 160-170. HDL cholesterol 45..    ASSESSMENT AND PLAN:  1. Elevated LDL cholesterol in a patient unable to tolerate statin therapy in the form of Crestor.  2. Musculoskeletal pain and weakness while on  Crestor  3. Diabetes mellitus, type II  4. Hypertension, essential   Current medicines are reviewed at length with the patient today.  The patient has the following concerns regarding medicines: Refuses statin therapy..  The following changes/actions have been instituted:    Cardio IQ  If increase LDL particle number and small size, will refer to lipid clinic for consideration of PCSK-9  Aspirin 81 mg daily  Labs/ tests ordered today include:  No orders of the defined types were placed in this encounter.     Disposition:   FU with HS in When necessary  Signed, Lesleigh Noe, MD  02/27/2015 2:59 PM    [facility] [address] [place], [place], [place]  [address] Phone: [phone]; Fax: [phone]

## 2015-03-05 ENCOUNTER — Ambulatory Visit: Payer: Medicare Other

## 2015-03-05 LAB — CARDIO IQ(R) ADVANCED LIPID PANEL
Apolipoprotein B: 151 mg/dL — ABNORMAL HIGH (ref 49–103)
Cholesterol, Total: 297 mg/dL — ABNORMAL HIGH (ref 125–200)
Cholesterol/HDL Ratio: 5 calc (ref <=5.0)
HDL Cholesterol: 59 mg/dL (ref >=46)
LDL Large: 7367 nmol/L (ref 5038–17886)
LDL Medium: 408 nmol/L — ABNORMAL HIGH (ref 121–397)
LDL Particle Number: 1992 nmol/L (ref 1016–2185)
LDL Peak Size: 211.5 Angstrom — ABNORMAL LOW (ref >=218.2)
LDL Small: 499 nmol/L — ABNORMAL HIGH (ref 115–386)
LDL, Calculated: 204 mg/dL — ABNORMAL HIGH
Lipoprotein (a): 30 nmol/L (ref <75)
Non-HDL Cholesterol: 238 mg/dL
Triglycerides: 172 mg/dL — ABNORMAL HIGH

## 2015-03-06 ENCOUNTER — Encounter: Payer: Self-pay | Admitting: Family Medicine

## 2015-03-06 ENCOUNTER — Ambulatory Visit: Payer: Self-pay | Admitting: Family Medicine

## 2015-03-06 ENCOUNTER — Ambulatory Visit (INDEPENDENT_AMBULATORY_CARE_PROVIDER_SITE_OTHER): Payer: Medicare Other | Admitting: Family Medicine

## 2015-03-06 VITALS — BP 138/84 | HR 100 | Temp 98.3°F | Wt 180.8 lb

## 2015-03-06 DIAGNOSIS — M25561 Pain in right knee: Secondary | ICD-10-CM | POA: Diagnosis not present

## 2015-03-06 NOTE — Progress Notes (Signed)
BP 138/84 mmHg  Pulse 100  Temp(Src) 98.3 F (36.8 C) (Oral)  Wt 180 lb 12 oz (81.988 kg)  SpO2 96%   CC: check leg pain  Subjective:    Patient ID: Jordan Patterson, female    DOB: 1940-04-03, 75 y.o.   MRN: [id number]  HPI: Jordan Patterson is a 75 y.o. female presenting on 03/06/2015 for Leg Pain   2 month history of lateral R leg pain below the knee. May have started after walking up and down stairs at work (more than normal). Describes sharp pain with radiation to ankle. Knee does not give out or lock in place. No other inciting trauma/injury to knee. Notes worse with going down stairs.   Tried voltaren gel which helped as well as icy hot. Rest has helped the most.   H/o osteoporosis by fracture not T score.   Relevant past medical, surgical, family and social history reviewed and updated as indicated. Interim medical history since our last visit reviewed. Allergies and medications reviewed and updated. Current Outpatient Prescriptions on File Prior to Visit  Medication Sig  . albuterol (VENTOLIN HFA) 108 (90 BASE) MCG/ACT inhaler Inhale 2 puffs into the lungs every 6 (six) hours as needed for wheezing or shortness of breath.  Marland Kitchen aspirin 81 MG tablet Take 81 mg by mouth daily.  . benzonatate (TESSALON) 100 MG capsule Take 1 capsule (100 mg total) by mouth 2 (two) times daily as needed for cough.  . calcitonin, salmon, (MIACALCIN/FORTICAL) 200 UNIT/ACT nasal spray Place 1 spray into alternate nostrils daily.  . Coenzyme Q10 (COQ10) 100 MG CAPS Take 100 mg by mouth daily.   Marland Kitchen EPINEPHrine 0.3 mg/0.3 mL IJ SOAJ injection Inject 0.3 mg into the muscle as needed for anaphylaxis.  . Garlic (GARLIQUE PO) Take 1 tablet by mouth daily.  . Glucosamine-Chondroitin (OSTEO BI-FLEX REGULAR STRENGTH PO) Take 2 tablets by mouth daily with supper.  Marland Kitchen glucose blood test strip ONE TOUCH ULTRA MINI Use as instructed to check sugar 2-3 daily. Dx: 250.00  . levocetirizine (XYZAL) 5 MG tablet TAKE ONE  TABLET BY MOUTH EACH EVENING  . losartan (COZAAR) 50 MG tablet Take 1 tablet (50 mg total) by mouth daily.  . Magnesium 250 MG TABS Take 250 mg by mouth daily.  . Melatonin 5 MG TABS Take 5 mg by mouth at bedtime.   . metFORMIN (GLUCOPHAGE) 500 MG tablet Take 1 tablet (500 mg total) by mouth daily with breakfast.  . montelukast (SINGULAIR) 10 MG tablet TAKE ONE TABLET BY MOUTH AT BEDTIME  . Multiple Vitamin (MULTIVITAMIN WITH MINERALS) TABS tablet Take 1 tablet by mouth daily.  . Multiple Vitamins-Minerals (EYE VITAMINS) CAPS Take 1 capsule by mouth daily.  . NONFORMULARY OR COMPOUNDED ITEM Allergy Vaccine 1:50 Given at [facility]  . omeprazole (PRILOSEC) 40 MG capsule Take 1 capsule (40 mg total) by mouth 2 (two) times daily.  . Polyvinyl Alcohol-Povidone (REFRESH OP) Place 1 drop into both eyes daily.   Marland Kitchen SYNTHROID 100 MCG tablet Take 1 tablet (100 mcg total) by mouth daily before breakfast.  . triamcinolone (NASACORT ALLERGY 24HR) 55 MCG/ACT AERO nasal inhaler Place 1 spray into both nostrils daily as needed (congestion).   . triamterene-hydrochlorothiazide (DYAZIDE) 37.5-25 MG capsule TAKE ONE CAPSULE BY MOUTH EVERY MORNING  . Vitamin D, Cholecalciferol, 400 UNITS CAPS Take 1 capsule by mouth daily.   No current facility-administered medications on file prior to visit.    Review of Systems Per HPI unless  specifically indicated in ROS section     Objective:    BP 138/84 mmHg  Pulse 100  Temp(Src) 98.3 F (36.8 C) (Oral)  Wt 180 lb 12 oz (81.988 kg)  SpO2 96%  Wt Readings from Last 3 Encounters:  03/06/15 180 lb 12 oz (81.988 kg)  02/27/15 181 lb 12.8 oz (82.464 kg)  02/07/15 179 lb 12 oz (81.534 kg)    Physical Exam  Constitutional: She appears well-developed and well-nourished. No distress.  Musculoskeletal: She exhibits no edema.  L knee WNL R Knee exam: No deformity on inspection. No pain with palpation of knee landmarks. No effusion/swelling noted. FROM in  flex/extension without crepitus. No popliteal fullness. Neg drawer test. Neg mcmurray test. No pain with valgus/varus stress.  Skin: Skin is warm and dry. No rash noted.  Psychiatric: She has a normal mood and affect.  Nursing note and vitals reviewed.      Assessment & Plan:   Problem List Items Addressed This Visit    Right knee pain - Primary    Actually location was inferio lateral to R knee - and currently no pain. Anticipate tendonitis which is resolving with rest. Suggested buy knee brace for extra support/compression if knee pain starts bothering her again. Pt agrees.          Follow up plan: Return if symptoms worsen or fail to improve.

## 2015-03-06 NOTE — Assessment & Plan Note (Addendum)
Actually location was inferio lateral to R knee - and currently no pain. Anticipate tendonitis which is resolving with rest. Suggested buy knee brace for extra support/compression if knee pain starts bothering her again. Pt agrees.

## 2015-03-06 NOTE — Progress Notes (Signed)
Pre visit review using our clinic review tool, if applicable. No additional management support is needed unless otherwise documented below in the visit note. 

## 2015-03-06 NOTE — Patient Instructions (Addendum)
Return for prevnar at your convenience (nurse visit). I think right leg pain may have been tendonitis - if starts bothering you again, buy neoprene knee sleeve for extra support and stability.  You can continue icy hot as needed.

## 2015-03-07 ENCOUNTER — Telehealth: Payer: Self-pay | Admitting: Internal Medicine

## 2015-03-07 NOTE — Telephone Encounter (Signed)
Spoke with pt. States that she has some albuterol neb solution at home. It is 75 years old, she wanted to know if it would be safe to use. Advised her that it probably wouldn't have the same effect as fresh medication. She verbalized understanding. Asked if she needed a new prescription and she declined at this time. Nothing further was needed.

## 2015-03-10 ENCOUNTER — Encounter: Payer: Self-pay | Admitting: Internal Medicine

## 2015-03-10 ENCOUNTER — Telehealth: Payer: Self-pay | Admitting: Interventional Cardiology

## 2015-03-10 NOTE — Telephone Encounter (Signed)
Returned pt call. Adv pt that she already had the Cardio IQ lab drawn at her o/v on 02/27/15. Pt aware of results and Dr.Smith's recommendation. Lipid panel is very concerning and high risk for development of atherosclerosis. Needs referral to the lipid clinic and consideration of PCSK-9 Adv pt that a scheduler will call her to schedule an appt with our Lipid Clinic.  Adv her that I will also fwd a message to billing Bjorn Loser(Rhonda) to contact her.

## 2015-03-10 NOTE — Telephone Encounter (Signed)
New message      Pt states that she is due to have a lab test called "cardio IG" drawn.  There is no order in the computer and she also want to know if her insurance will pay.  Please call and let her know when the order is in and if ins will pay.  Please send CPT code to Bjorn LoserRhonda so that she can assist patient with her billing question.

## 2015-03-12 ENCOUNTER — Ambulatory Visit: Payer: Medicare Other

## 2015-03-12 ENCOUNTER — Ambulatory Visit (INDEPENDENT_AMBULATORY_CARE_PROVIDER_SITE_OTHER): Payer: Medicare Other | Admitting: Internal Medicine

## 2015-03-12 ENCOUNTER — Encounter: Payer: Self-pay | Admitting: Internal Medicine

## 2015-03-12 VITALS — BP 138/82 | HR 89 | Ht 68.5 in | Wt 182.4 lb

## 2015-03-12 DIAGNOSIS — J4520 Mild intermittent asthma, uncomplicated: Secondary | ICD-10-CM

## 2015-03-12 DIAGNOSIS — J309 Allergic rhinitis, unspecified: Principal | ICD-10-CM

## 2015-03-12 DIAGNOSIS — S22080S Wedge compression fracture of T11-T12 vertebra, sequela: Secondary | ICD-10-CM

## 2015-03-12 DIAGNOSIS — H1013 Acute atopic conjunctivitis, bilateral: Secondary | ICD-10-CM

## 2015-03-12 DIAGNOSIS — M4854XS Collapsed vertebra, not elsewhere classified, thoracic region, sequela of fracture: Secondary | ICD-10-CM

## 2015-03-12 MED ORDER — FLUTICASONE FUROATE-VILANTEROL 100-25 MCG/INH IN AEPB: INHALATION_SPRAY | RESPIRATORY_TRACT | Status: DC

## 2015-03-12 MED ORDER — ALBUTEROL SULFATE (2.5 MG/3ML) 0.083% IN NEBU: 2.5000 mg | INHALATION_SOLUTION | Freq: Four times a day (QID) | RESPIRATORY_TRACT | Status: DC | PRN

## 2015-03-12 NOTE — Progress Notes (Signed)
11/29/13- 80 yoF never smoker followed here by Dr Craige Cotta FOLLOW FOR:  Allergy consult; has been seeing Dr. [place] Callas, but wanted to find someone closer to her home. Referred to Dr. Maple Hudson by Dr. Craige Cotta History of allergic rhinitis/hay fever which was originally worse in the fall but is now perennial. Allergy skin testing 09/19/2012 by Dr. Collene Mares for dust mite, cockroach, tree pollens, grass pollens, cat She began allergy vaccine in August of 2014 and feels that has helped, with no adverse reactions. She anticipates continuing allergy vaccine therapy. She has not had adequate control with Nasacort, Xyzal,  Her asthma is managed by Dr. Craige Cotta with albuterol , Singulair.  She denies history of adverse reactions to insect stings or specific foods and does not have a significant history of cutaneous allergy. GERD is managed with omeprazole twice daily but she still is aware of reflux events.  02/05/14-73 yoF never smoker followed here by Dr Craige Cotta FOLLOWS FOR: Pt states she is doing well overall with allergies. Continues allergy vaccine. Denies any wheeze. Has a rescue inhaler, not needed. Using Nasacort. Feeling some pressure right ear without loss of hearing.  02/04/15- 75 year old female never smoker followed for allergic rhinitis, asthma, complicated by GERD, hypothyroid, DM 2 Allergy Vaccine FOLLOWS FOR: Pt c/o increased wheezing and coughing. SOB at times. Tolerating allergy vaccines well.   03/12/2015-75 year old female never smoker followed for allergic rhinitis, asthma, complicated by GERD, hypothyroid, DM 2 Allergy Vaccine 1:10 GH every 2 weeks Follow For: Pt c/o increased wheezing, coughing and SOB. Coughing up a small amount of white phlegm first thing in the morning. Tolerating allergy vaccines well.  As Nasacort nasal spray if needed. Uses albuterol rescue inhaler about once daily, most days.  ROS-see HPI Constitutional:   No-   weight loss, night sweats, fevers, chills, fatigue,  lassitude. HEENT:   No-  headaches, difficulty swallowing, tooth/dental problems, sore throat,       +sneezing, itching, ear ache, nasal congestion, post nasal drip,  CV:  No-   chest pain, orthopnea, PND, swelling in lower extremities, anasarca,                                                          dizziness, palpitations Resp: No-   shortness of breath with exertion or at rest.              No-   productive cough,  No non-productive cough,  No- coughing up of blood.              No-   change in color of mucus.+ wheezing.   Skin: No-   rash or lesions. GI:  +  heartburn, indigestion, No-abdominal pain, nausea, vomiting, GU:  MS:  No-   joint pain or swelling.   Neuro-     nothing unusual Psych:  No- change in mood or affect. No depression or anxiety.  No memory loss.  OBJ- Physical Exam General- Alert, Oriented, Affect-appropriate, Distress- none acute Skin- rash-none, lesions- none, excoriation- none Lymphadenopathy- none Head- atraumatic            Eyes- Gross vision intact, PERRLA, conjunctivae and secretions clear            Ears- clear            Nose- Clear, no-Septal dev, mucus,  polyps, erosion, perforation             Throat- Mallampati II , mucosa clear , drainage+white, tonsils- atrophic Neck- flexible , trachea midline, no stridor , thyroid nl, carotid no bruit Chest - symmetrical excursion , unlabored           Heart/CV- RRR , no murmur , no gallop  , no rub, nl s1 s2                           - JVD- none , edema- none, stasis changes- none, varices- none           Lung- clear to P&A, wheeze- none, cough + light , dullness-none, rub- none           Chest wall- + back brace Abd-  Br/ Gen/ Rectal- Not done, not indicated Extrem- cyanosis- none, clubbing, none, atrophy- none, strength- nl Neuro- grossly intact to observation

## 2015-03-12 NOTE — Patient Instructions (Signed)
We can continue allergy vaccine 1:10 GH for another year   Ok to use the Ventolin rescue inhaler every 6 hours if needed  Ry sample and script for Breo 100 maintenance inhaler to see if it reduces how often you need your rescue inhaler                        Inhale 1 puff then rinse mouth, once daily

## 2015-03-13 ENCOUNTER — Other Ambulatory Visit: Payer: Self-pay | Admitting: Internal Medicine

## 2015-03-13 ENCOUNTER — Other Ambulatory Visit: Payer: Self-pay | Admitting: Pulmonary Disease

## 2015-03-13 ENCOUNTER — Other Ambulatory Visit: Payer: Self-pay | Admitting: Family Medicine

## 2015-03-14 ENCOUNTER — Ambulatory Visit (INDEPENDENT_AMBULATORY_CARE_PROVIDER_SITE_OTHER): Payer: Medicare Other | Admitting: Pharmacist

## 2015-03-14 DIAGNOSIS — E785 Hyperlipidemia, unspecified: Secondary | ICD-10-CM | POA: Diagnosis not present

## 2015-03-14 MED ORDER — ROSUVASTATIN CALCIUM 5 MG PO TABS: ORAL_TABLET | ORAL | Status: DC

## 2015-03-14 NOTE — Patient Instructions (Addendum)
Start taking Crestor  twice a week.  Call the clinic if you have any issues with the medication - [phone] Prudence Davidson, pharmacist.   Recheck labs in 3 months and follow-up in lipid clinic.

## 2015-03-14 NOTE — Progress Notes (Signed)
Patient ID:  Jordan Patterson                DOB:  March 06, 1940                        MRN:  [id number]     HPI: Jordan Patterson is a 75 y.o. female patient referred to lipid clinic by Dr. Katrinka Blazing. She has previously tried Crestor at an unknown daily dose. She states she took it for years several years ago. She reports that she stopped because she did not think it really helped her and she was having muscle aches. She recently had a CardioIQ panel performed. A full discussion of results was carried out. She was extremely reluctant to try statin therapy again. Carried out a complete, lengthy discussion of options (low dose statin, zetia, and PCSK-9i) including side effects, cost, and benefit.   Current Medications: none for dyslipidemia Intolerances: Crestor unknown daily dose Risk Factors:  HTN, DM LDL goal: <130  Diet: Has made improvements in diet after being diagnosed with diabetes. She reports that she does not eat much red meat. She usually eats chicken as her protein. She has been increasing the amount of salads she eats and plans to eat more oatmeal after these cholesterol results.   Exercise: Patient states she was forced into exercise due to breaking her back several months ago. She states that she walks 1-2 miles 5 days a week.   Family History: Patient reports that she believes her mother had high cholesterol and blood pressure and passed away from a stroke after mitral valve surgery. Her father had diabetes and valve issues, but she is unsure what caused his death. She reports that as far as she is aware her brothers do not have high cholesterol.   Social History: She has never smoked and does not drink alcohol.   Labs: TC: 297  LDL: 204  HDL: 59  TG: 172    Past Medical History  Diagnosis Date  . Hiatal hernia   . GERD (gastroesophageal reflux disease)     [name] EGD - mild esophageal dysmotility and small hiatal hernia  . Hypertension   . Extrinsic asthma   . Hyperlipidemia    mild, diet controlled  . Hypothyroidism   . Osteoporosis with fracture 07/2014    T -1.2 hip, T 0.2 spine, T12 compression fracture s/p kyphoplasty  . Diabetes type 2, controlled (HCC) 2004    diet controlled  . Macular degeneration   . Arthritis     hands and knees - ?osteo  . Environmental allergies     dust,mold,mildew  . Hx of migraines   . Chronic headaches   . Allergic rhinitis   . Depression     pt denies  . Compression fracture of T12 vertebra (HCC) 07/18/2014    S/p kyphoplasty by Dr Newell Coral (08/2014)     Current Outpatient Prescriptions on File Prior to Visit  Medication Sig Dispense Refill  . albuterol (PROVENTIL) (2.5 MG/3ML) 0.083% nebulizer solution Take 3 mLs (2.5 mg total) by nebulization every 6 (six) hours as needed for wheezing or shortness of breath. 75 mL 12  . albuterol (VENTOLIN HFA) 108 (90 BASE) MCG/ACT inhaler Inhale 2 puffs into the lungs every 6 (six) hours as needed for wheezing or shortness of breath. 1 Inhaler 5  . aspirin 81 MG tablet Take 81 mg by mouth daily.    . benzonatate (TESSALON) 100 MG capsule Take 1 capsule (100  mg total) by mouth 2 (two) times daily as needed for cough. 30 capsule 0  . calcitonin, salmon, (MIACALCIN/FORTICAL) 200 UNIT/ACT nasal spray Place 1 spray into alternate nostrils daily. 3.7 mL 11  . Coenzyme Q10 (COQ10) 100 MG CAPS Take 100 mg by mouth daily.     Marland Kitchen EPINEPHrine 0.3 mg/0.3 mL IJ SOAJ injection Inject 0.3 mg into the muscle as needed for anaphylaxis.    Marland Kitchen Fluticasone Furoate-Vilanterol (BREO ELLIPTA) 100-25 MCG/INH AEPB Inhale 1 puff then rinse mouth, once daily- maintenance 60 each 12  . Garlic (GARLIQUE PO) Take 1 tablet by mouth daily.    . Glucosamine-Chondroitin (OSTEO BI-FLEX REGULAR STRENGTH PO) Take 2 tablets by mouth daily with supper.    Marland Kitchen glucose blood test strip ONE TOUCH ULTRA MINI Use as instructed to check sugar 2-3 daily. Dx: 250.00 100 each 3  . levocetirizine (XYZAL) 5 MG tablet TAKE ONE TABLET BY MOUTH  EACH EVENING 30 tablet 3  . losartan (COZAAR) 50 MG tablet Take 1 tablet (50 mg total) by mouth daily. 30 tablet 11  . Magnesium 250 MG TABS Take 250 mg by mouth daily.    . Melatonin 5 MG TABS Take 5 mg by mouth at bedtime.     . metFORMIN (GLUCOPHAGE) 500 MG tablet TAKE 1 TABLET BY MOUTH DAILY WITH BREAKFAST 90 tablet 3  . montelukast (SINGULAIR) 10 MG tablet TAKE ONE TABLET BY MOUTH AT BEDTIME 30 tablet 1  . Multiple Vitamin (MULTIVITAMIN WITH MINERALS) TABS tablet Take 1 tablet by mouth daily.    . Multiple Vitamins-Minerals (EYE VITAMINS) CAPS Take 1 capsule by mouth daily.    . NONFORMULARY OR COMPOUNDED ITEM Allergy Vaccine 1:50 Given at [facility]    . omeprazole (PRILOSEC) 40 MG capsule Take 1 capsule (40 mg total) by mouth 2 (two) times daily. 60 capsule 11  . Polyvinyl Alcohol-Povidone (REFRESH OP) Place 1 drop into both eyes daily.     Marland Kitchen SYNTHROID 100 MCG tablet Take 1 tablet (100 mcg total) by mouth daily before breakfast. 30 tablet 11  . triamcinolone (NASACORT ALLERGY 24HR) 55 MCG/ACT AERO nasal inhaler Place 1 spray into both nostrils daily as needed (congestion).     . triamterene-hydrochlorothiazide (DYAZIDE) 37.5-25 MG capsule TAKE ONE CAPSULE BY MOUTH EVERY MORNING 30 capsule 11  . Vitamin D, Cholecalciferol, 400 UNITS CAPS Take 1 capsule by mouth daily.     No current facility-administered medications on file prior to visit.    Allergies  Allergen Reactions  . Ace Inhibitors Cough  . Amlodipine Cough  . Crestor [Rosuvastatin] Other (See Comments)    myalgias  . Pregabalin Other (See Comments)    Unknown reaction  . Tegretol [Carbamazepine] Other (See Comments)    Dizziness, headache  . Wellbutrin [Bupropion Hcl] Other (See Comments)    Unknown reaction  . Cymbalta [Duloxetine Hcl] Palpitations and Other (See Comments)    headaches    Assessment/Plan: Dyslipidemia: LDL is not at goal. Start Crestor  twice weekly as Zetia alone will likely not get  patient to goal and PCSK9i will likely be expensive based on insurance. Instructed patient to call clinic if she experienced any myalgias. Lipid and hepatic panel in 3 months. Follow-up in lipid clinic in 3 months.

## 2015-03-16 ENCOUNTER — Encounter: Payer: Self-pay | Admitting: Internal Medicine

## 2015-03-16 ENCOUNTER — Encounter: Payer: Self-pay | Admitting: Family Medicine

## 2015-03-17 MED ORDER — LEVOCETIRIZINE DIHYDROCHLORIDE 5 MG PO TABS: ORAL_TABLET | ORAL | Status: DC

## 2015-03-26 ENCOUNTER — Ambulatory Visit (INDEPENDENT_AMBULATORY_CARE_PROVIDER_SITE_OTHER): Payer: Medicare Other | Admitting: Gastroenterology

## 2015-03-26 ENCOUNTER — Ambulatory Visit (INDEPENDENT_AMBULATORY_CARE_PROVIDER_SITE_OTHER): Payer: Medicare Other

## 2015-03-26 ENCOUNTER — Encounter: Payer: Self-pay | Admitting: Gastroenterology

## 2015-03-26 VITALS — BP 140/80 | HR 92 | Ht 66.75 in | Wt 185.0 lb

## 2015-03-26 DIAGNOSIS — K219 Gastro-esophageal reflux disease without esophagitis: Secondary | ICD-10-CM

## 2015-03-26 DIAGNOSIS — J309 Allergic rhinitis, unspecified: Secondary | ICD-10-CM | POA: Diagnosis not present

## 2015-03-26 DIAGNOSIS — Z1211 Encounter for screening for malignant neoplasm of colon: Secondary | ICD-10-CM

## 2015-03-26 MED ORDER — OMEPRAZOLE 40 MG PO CPDR: 40.0000 mg | DELAYED_RELEASE_CAPSULE | Freq: Two times a day (BID) | ORAL | Status: DC

## 2015-03-26 NOTE — Patient Instructions (Addendum)
We have sent your demographic and insurance information to [employer]. They should contact you within the next week regarding your Cologuard (colon cancer screening) test. If you have not heard from them within the next week, please call our office at [phone].  Follow up in 12 months  We will refill Prilosec

## 2015-03-26 NOTE — Progress Notes (Signed)
Jordan Patterson    [id number]    June 16, 1940  Primary Care Physician:Javier Sharen Hones, MD  Referring Physician: Eustaquio Boyden, MD [address] [place], [place] [address]  Chief complaint:  GERD  HPI: 75 year old female with history of chronic gastroesophageal acid reflux here for follow-up visit. She also has history of asthma and is currently well controlled on nebulizer . 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 17 mm Savary dilators passed without resistance. She has never had a colonoscopy. Last fall she broke her T12 vertebra after a fall, had surgery and is currently has a back brace Denies any nausea, vomiting, abdominal pain, melena or bright red blood per rectum    Outpatient Encounter Prescriptions as of 03/26/2015  Medication Sig  . albuterol (PROVENTIL) (2.5 MG/3ML) 0.083% nebulizer solution Take 3 mLs (2.5 mg total) by nebulization every 6 (six) hours as needed for wheezing or shortness of breath.  Marland Kitchen albuterol (VENTOLIN HFA) 108 (90 BASE) MCG/ACT inhaler Inhale 2 puffs into the lungs every 6 (six) hours as needed for wheezing or shortness of breath.  Marland Kitchen aspirin 81 MG tablet Take 81 mg by mouth daily.  . calcitonin, salmon, (MIACALCIN/FORTICAL) 200 UNIT/ACT nasal spray Place 1 spray into alternate nostrils daily.  . Coenzyme Q10 (COQ10) 100 MG CAPS Take 100 mg by mouth daily.   Marland Kitchen EPINEPHrine 0.3 mg/0.3 mL IJ SOAJ injection Reported on 03/14/2015  . Garlic (GARLIQUE PO) Take 1 tablet by mouth daily.  . Glucosamine-Chondroitin (OSTEO BI-FLEX REGULAR STRENGTH PO) Take 2 tablets by mouth daily with supper.  Marland Kitchen glucose blood test strip ONE TOUCH ULTRA MINI Use as instructed to check sugar 2-3 daily. Dx: 250.00  . levocetirizine (XYZAL) 5 MG tablet TAKE ONE TABLET BY MOUTH EACH EVENING    . losartan (COZAAR) 50 MG tablet Take 1 tablet (50 mg total) by mouth daily.  . Magnesium 250 MG TABS Take 250 mg by mouth daily. Reported on 03/14/2015  . Melatonin 5 MG TABS Take 5 mg by mouth at bedtime.   . metFORMIN (GLUCOPHAGE) 500 MG tablet TAKE 1 TABLET BY MOUTH DAILY WITH BREAKFAST  . montelukast (SINGULAIR) 10 MG tablet TAKE ONE TABLET BY MOUTH AT BEDTIME  . Multiple Vitamin (MULTIVITAMIN WITH MINERALS) TABS tablet Take 1 tablet by mouth daily.  . Multiple Vitamins-Minerals (EYE VITAMINS) CAPS Take 1 capsule by mouth daily.  . NONFORMULARY OR COMPOUNDED ITEM Allergy Vaccine 1:50 Given at [facility]  . omeprazole (PRILOSEC) 40 MG capsule Take 1 capsule (40 mg total) by mouth 2 (two) times daily.  . Polyvinyl Alcohol-Povidone (REFRESH OP) Place 1 drop into both eyes daily.   . rosuvastatin (CRESTOR) 5 MG tablet Take as directed by lipid clinic. Start with  twice weekly.  Marland Kitchen SYNTHROID 100 MCG tablet Take 1 tablet (100 mcg total) by mouth daily before breakfast.  . triamcinolone (NASACORT ALLERGY 24HR) 55 MCG/ACT AERO nasal inhaler Place 1 spray into both nostrils daily as needed (congestion). Reported on 03/14/2015  . triamterene-hydrochlorothiazide (DYAZIDE) 37.5-25 MG capsule TAKE ONE CAPSULE BY MOUTH EVERY MORNING  . Vitamin D, Cholecalciferol, 400 UNITS CAPS Take 1 capsule by mouth daily.  . [DISCONTINUED] Fluticasone Furoate-Vilanterol (BREO ELLIPTA) 100-25 MCG/INH AEPB Inhale 1 puff then rinse mouth, once daily- maintenance  No facility-administered encounter medications on file as of 03/26/2015.    Allergies as of 03/26/2015 - Review Complete 03/26/2015  Allergen Reaction Noted  . Ace inhibitors Cough 05/19/2011  . Amlodipine Cough 05/19/2011  . Crestor [rosuvastatin] Other (See Comments) 01/24/2014  . Pregabalin Other (See Comments) 05/19/2011  . Tegretol [carbamazepine] Other (See Comments) 05/19/2011  . Wellbutrin [bupropion hcl] Other (See Comments) 05/19/2011   . Cymbalta [duloxetine hcl] Palpitations and Other (See Comments) 05/19/2011    Past Medical History  Diagnosis Date  . Hiatal hernia   . GERD (gastroesophageal reflux disease)     [name] EGD - mild esophageal dysmotility and small hiatal hernia  . Hypertension   . Extrinsic asthma   . Hyperlipidemia     mild, diet controlled  . Hypothyroidism   . Osteoporosis with fracture 07/2014    T -1.2 hip, T 0.2 spine, T12 compression fracture s/p kyphoplasty  . Diabetes type 2, controlled (HCC) 2004    diet controlled  . Macular degeneration   . Arthritis     hands and knees - ?osteo  . Environmental allergies     dust,mold,mildew  . Hx of migraines   . Chronic headaches   . Allergic rhinitis   . Depression     pt denies  . Compression fracture of T12 vertebra (HCC) 07/18/2014    S/p kyphoplasty by Dr Newell Coral (08/2014)   . Asthma     Past Surgical History  Procedure Laterality Date  . Dilation and curettage of uterus  1978  . Tonsillectomy and adenoidectomy  1962  . Laparoscopy abdomen diagnostic      To R/O endometriosis  . Esophagogastroduodenoscopy  06/25/2011    Procedure: ESOPHAGOGASTRODUODENOSCOPY (EGD);  Surgeon: Hart Carwin, MD;  Location: [facility];  Service: Endoscopy;  Laterality: N/A;  no Xray  . Savory dilation  06/25/2011    Procedure: SAVORY DILATION;  Surgeon: Hart Carwin, MD;  Location: [facility];  Service: Endoscopy;  Laterality: N/A;  . Cataract extraction  2003    bilateral with lens implant  . Dexa  05/2008    improved (initial DEXA 2007 - mild osteopenia)  . Colonoscopy    . Kyphoplasty N/A 08/30/2014    Procedure: THORACIC TWELVE KYPHOPLASTY;  Surgeon: [name], MD  . US echocardiography  10/2014    nl systolic function EF 55%, mild diastolic dysfunction    Family History  Problem Relation Age of Onset  . Mitral valve prolapse Father   . Diabetes Father   . Mitral valve prolapse Mother   . Hypertension Mother   . Stroke Mother 28     after valve surgery  . Stroke Paternal Grandfather   . Heart failure Maternal Grandfather   . Cancer Neg Hx   . Parkinson's disease Brother   . Other Brother     POST WAR TRAUMA    Social History   Social History  . Marital Status: Widowed    Spouse Name: N/A  . Number of Children: 3  . Years of Education: N/A   Occupational History  . Retired    Social History Main Topics  . Smoking status: Never Smoker   . Smokeless tobacco: Never Used  . Alcohol Use: No  . Drug Use: No  . Sexual Activity: Not on file   Other Topics Concern  . 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  HENT: Negative.   Eyes: Negative for blurred vision.  Respiratory: Negative for cough, shortness of breath and wheezing.   Cardiovascular: Negative for chest pain and palpitations.  Gastrointestinal: as per HPI Genitourinary: Negative for dysuria, urgency, frequency and hematuria.  Musculoskeletal: Negative for myalgias, back pain and joint pain.  Skin: Negative for itching and rash.  Neurological: Negative for dizziness, tremors, focal weakness, seizures and loss of consciousness.  Endo/Heme/Allergies: Negative for environmental allergies.  Psychiatric/Behavioral: Negative for depression, suicidal ideas and hallucinations.  All other systems reviewed and are negative.   Physical Exam: Filed Vitals:   03/26/15 0937  BP: 140/80  Pulse: 92   Gen:      No acute distress, back brace HEENT:  EOMI, sclera anicteric Neck:     No masses; no thyromegaly Lungs:    Clear to auscultation bilaterally; normal respiratory  effort CV:         Regular rate and rhythm; no murmurs Abd:      + bowel sounds; soft, non-tender; no palpable masses, no distension Ext:    No edema; adequate peripheral perfusion Skin:      Warm and dry; no rash Neuro: alert and oriented x 3 Psych: normal mood and affect  Data Reviewed: As per history of present illness   Assessment and Plan/Recommendations: 75 year old female with history of chronic GERD and asthma here for follow-up visit Currently symptoms are well controlled on PPI twice daily Discussed antireflux measures in detail She never underwent screening colonoscopy for colorectal cancer Discussed about Cologaurd (fecal immunochemical test) she is willing to do it, if positive we'll have to further evaluate with colonoscopy.  Return in 1 year  K. Scherry Ran , MD [phone] Mon-Fri 8a-5p [phone] after 5p, weekends, holidays

## 2015-03-30 NOTE — Assessment & Plan Note (Signed)
Wearing back brace without significant impairment of lung expansion at this visit

## 2015-03-30 NOTE — Assessment & Plan Note (Addendum)
Mild persistent Back pain would not permit meaningful spirometry effort at this visit. Plan-try sample [employer]

## 2015-03-30 NOTE — Assessment & Plan Note (Signed)
We can continue allergy vaccine another year 

## 2015-04-03 ENCOUNTER — Telehealth: Payer: Self-pay | Admitting: Gastroenterology

## 2015-04-03 NOTE — Telephone Encounter (Signed)
Called patient to inform her that I submitted form today and had to submit correct insurance information today,  told her to contact the office if she does not hear back from them by next Wednesday

## 2015-04-06 ENCOUNTER — Encounter: Payer: Self-pay | Admitting: Pharmacist

## 2015-04-08 ENCOUNTER — Encounter: Payer: Self-pay | Admitting: Family Medicine

## 2015-04-08 DIAGNOSIS — M25561 Pain in right knee: Secondary | ICD-10-CM

## 2015-04-09 ENCOUNTER — Ambulatory Visit (INDEPENDENT_AMBULATORY_CARE_PROVIDER_SITE_OTHER): Payer: Medicare Other

## 2015-04-09 DIAGNOSIS — J309 Allergic rhinitis, unspecified: Secondary | ICD-10-CM

## 2015-04-09 NOTE — Telephone Encounter (Signed)
Please see Mychart message.

## 2015-04-10 NOTE — Telephone Encounter (Signed)
Knee xray ordered.

## 2015-04-17 ENCOUNTER — Ambulatory Visit: Payer: Medicare Other

## 2015-04-17 ENCOUNTER — Encounter: Payer: Self-pay | Admitting: Internal Medicine

## 2015-04-17 ENCOUNTER — Ambulatory Visit (INDEPENDENT_AMBULATORY_CARE_PROVIDER_SITE_OTHER)
Admission: RE | Admit: 2015-04-17 | Discharge: 2015-04-17 | Disposition: A | Discharge status: Home / Self Care | Payer: Medicare Other | Source: Ambulatory Visit | Attending: Family Medicine | Admitting: Family Medicine

## 2015-04-17 DIAGNOSIS — M25561 Pain in right knee: Secondary | ICD-10-CM | POA: Diagnosis not present

## 2015-04-18 LAB — COLOGUARD: Cologuard: NEGATIVE

## 2015-04-23 ENCOUNTER — Ambulatory Visit (INDEPENDENT_AMBULATORY_CARE_PROVIDER_SITE_OTHER): Payer: Medicare Other | Admitting: *Deleted

## 2015-04-23 DIAGNOSIS — J309 Allergic rhinitis, unspecified: Secondary | ICD-10-CM | POA: Diagnosis not present

## 2015-04-23 LAB — HM DIABETES EYE EXAM

## 2015-04-24 ENCOUNTER — Encounter: Payer: Self-pay | Admitting: Family Medicine

## 2015-04-24 ENCOUNTER — Encounter: Payer: Self-pay | Admitting: Pharmacist

## 2015-04-24 DIAGNOSIS — M25561 Pain in right knee: Secondary | ICD-10-CM

## 2015-04-28 ENCOUNTER — Other Ambulatory Visit: Payer: Self-pay | Admitting: Internal Medicine

## 2015-04-28 DIAGNOSIS — J309 Allergic rhinitis, unspecified: Secondary | ICD-10-CM | POA: Diagnosis not present

## 2015-04-28 NOTE — Telephone Encounter (Signed)
Allergy Serum Extract Date Mixed: 04/28/15 Vial: 2 Strength: 1:10 Here/Mail/Pick Up: here Mixed By: tbs Last OV: 07/16/15 Pending OV: 02/04/16   I'm attaching this note to this encounter b/c I noticed there's no rx in pt.'s chart. She was put on build up of 1:10( by LCL) 11/26/14 (0.1 to 0.5) and did fine.The only thing I could find in your notes was continue 1:10. If you would please write a rx for 11/26/14 so I can have it scanned in her chart. Thanks!   Nothing further needed. Closing encounter.

## 2015-05-01 ENCOUNTER — Other Ambulatory Visit: Payer: Self-pay | Admitting: Gastroenterology

## 2015-05-01 ENCOUNTER — Telehealth: Payer: Self-pay | Admitting: *Deleted

## 2015-05-01 DIAGNOSIS — Z1211 Encounter for screening for malignant neoplasm of colon: Secondary | ICD-10-CM

## 2015-05-01 LAB — COLOGUARD

## 2015-05-01 NOTE — Telephone Encounter (Signed)
Wanted to let you know patients Cologuard was neg before I contact the patient

## 2015-05-01 NOTE — Telephone Encounter (Signed)
Ok thank you 

## 2015-05-01 NOTE — Telephone Encounter (Signed)
Called patient to inform Cologuard test was negative

## 2015-05-07 ENCOUNTER — Ambulatory Visit (INDEPENDENT_AMBULATORY_CARE_PROVIDER_SITE_OTHER): Payer: Medicare Other | Admitting: *Deleted

## 2015-05-07 DIAGNOSIS — J309 Allergic rhinitis, unspecified: Secondary | ICD-10-CM | POA: Diagnosis not present

## 2015-05-07 NOTE — Telephone Encounter (Signed)
Done- hand written

## 2015-05-09 ENCOUNTER — Other Ambulatory Visit: Payer: Self-pay | Admitting: Internal Medicine

## 2015-05-09 DIAGNOSIS — H1013 Acute atopic conjunctivitis, bilateral: Secondary | ICD-10-CM

## 2015-05-09 DIAGNOSIS — J309 Allergic rhinitis, unspecified: Principal | ICD-10-CM

## 2015-05-09 NOTE — Addendum Note (Signed)
Addended by: Jetty DuhamelYOUNG, Verlisa Vara D on: 05/09/2015 12:50 PM   Modules accepted: Orders

## 2015-05-12 ENCOUNTER — Other Ambulatory Visit: Payer: Self-pay | Admitting: Pulmonary Disease

## 2015-05-15 ENCOUNTER — Encounter: Payer: Self-pay | Admitting: Internal Medicine

## 2015-05-20 ENCOUNTER — Ambulatory Visit (INDEPENDENT_AMBULATORY_CARE_PROVIDER_SITE_OTHER): Payer: Medicare Other | Admitting: *Deleted

## 2015-05-20 DIAGNOSIS — J309 Allergic rhinitis, unspecified: Secondary | ICD-10-CM | POA: Diagnosis not present

## 2015-05-21 ENCOUNTER — Ambulatory Visit: Payer: Medicare Other

## 2015-05-27 ENCOUNTER — Other Ambulatory Visit (INDEPENDENT_AMBULATORY_CARE_PROVIDER_SITE_OTHER): Payer: Medicare Other | Admitting: *Deleted

## 2015-05-27 DIAGNOSIS — E785 Hyperlipidemia, unspecified: Secondary | ICD-10-CM

## 2015-05-27 LAB — LIPID PANEL
Cholesterol: 262 mg/dL — ABNORMAL HIGH (ref 125–200)
HDL: 43 mg/dL — ABNORMAL LOW (ref >=46)
LDL Cholesterol: 187 mg/dL — ABNORMAL HIGH (ref <130)
Total CHOL/HDL Ratio: 6.1 Ratio — ABNORMAL HIGH (ref <=5.0)
Triglycerides: 162 mg/dL — ABNORMAL HIGH (ref <150)
VLDL: 32 mg/dL — ABNORMAL HIGH (ref <30)

## 2015-05-27 LAB — HEPATIC FUNCTION PANEL
ALT: 20 U/L (ref 6–29)
AST: 21 U/L (ref 10–35)
Albumin: 4.1 g/dL (ref 3.6–5.1)
Alkaline Phosphatase: 68 U/L (ref 33–130)
Bilirubin, Direct: 0.1 mg/dL (ref <=0.2)
Indirect Bilirubin: 0.3 mg/dL (ref 0.2–1.2)
Total Bilirubin: 0.4 mg/dL (ref 0.2–1.2)
Total Protein: 6.7 g/dL (ref 6.1–8.1)

## 2015-05-29 ENCOUNTER — Ambulatory Visit: Payer: Self-pay | Admitting: Family Medicine

## 2015-05-29 ENCOUNTER — Ambulatory Visit (INDEPENDENT_AMBULATORY_CARE_PROVIDER_SITE_OTHER): Payer: Medicare Other

## 2015-05-29 DIAGNOSIS — Z23 Encounter for immunization: Secondary | ICD-10-CM | POA: Diagnosis not present

## 2015-06-03 ENCOUNTER — Ambulatory Visit (INDEPENDENT_AMBULATORY_CARE_PROVIDER_SITE_OTHER): Payer: Medicare Other | Admitting: Pharmacist

## 2015-06-03 ENCOUNTER — Ambulatory Visit (INDEPENDENT_AMBULATORY_CARE_PROVIDER_SITE_OTHER): Payer: Medicare Other | Admitting: *Deleted

## 2015-06-03 ENCOUNTER — Ambulatory Visit: Payer: Medicare Other | Admitting: Pharmacist

## 2015-06-03 DIAGNOSIS — J309 Allergic rhinitis, unspecified: Secondary | ICD-10-CM

## 2015-06-03 DIAGNOSIS — E785 Hyperlipidemia, unspecified: Secondary | ICD-10-CM

## 2015-06-03 NOTE — Progress Notes (Signed)
Patient ID:  Jordan Patterson                DOB:  08-30-1940                        MRN:  [id number]     HPI: Jordan Patterson is a 75 y.o. female patient referred to lipid clinic by Dr. Katrinka BlazingSmith. She has previously tried Crestor at an unknown daily dose. She states she took it for years several years ago. She reports that she stopped because she did not think it really helped her and she was having muscle aches. At the last visit, pt agreed to try Crestor 5mg  two days of the week.  She sent us a message in February stating she was having issues with tolerability again.  She held the Crestor and then retried again in March with the same results.  Gave patient the options of another statin, fibrate, or study enrollment at that time.  She will not qualify for PCSK-9 inhibitors because she is primary prevention.  Pt is here today to discuss these options.   Current Medications: none for dyslipidemia Intolerances: Crestor 5mg  two days of the week.  Risk Factors:  HTN, DM LDL goal: <130  Family History: Patient reports that she believes her mother had high cholesterol and blood pressure and passed away from a stroke after mitral valve surgery. Her father had diabetes and valve issues, but she is unsure what caused his death. She reports that as far as she is aware her brothers do not have high cholesterol.   Social History: She has never smoked and does not drink alcohol.   Labs: 05/27/15- TC 262, TG 162, HDL 43, LDL 187, LFTS normal (no meds) 02/27/15- TC: 297  LDL: 204  HDL: 59  TG: 172  (no meds)  Past Medical History  Diagnosis Date  . Hiatal hernia   . GERD (gastroesophageal reflux disease)     [name]([facility]) EGD - mild esophageal dysmotility and small hiatal hernia  . Hypertension   . Extrinsic asthma   . Hyperlipidemia     mild, diet controlled  . Hypothyroidism   . Osteoporosis with fracture 07/2014    T -1.2 hip, T 0.2 spine, T12 compression fracture s/p kyphoplasty  . Diabetes type 2, controlled  (HCC) 2004    diet controlled  . Macular degeneration   . Arthritis     hands and knees - ?osteo  . Environmental allergies     dust,mold,mildew  . Hx of migraines   . Chronic headaches   . Allergic rhinitis   . Depression     pt denies  . Compression fracture of T12 vertebra (HCC) 07/18/2014    S/p kyphoplasty by Dr Newell CoralNudelman (08/2014)   . Asthma     Current Outpatient Prescriptions on File Prior to Visit  Medication Sig Dispense Refill  . albuterol (PROVENTIL) (2.5 MG/3ML) 0.083% nebulizer solution Take 3 mLs (2.5 mg total) by nebulization every 6 (six) hours as needed for wheezing or shortness of breath. 75 mL 12  . albuterol (VENTOLIN HFA) 108 (90 BASE) MCG/ACT inhaler Inhale 2 puffs into the lungs every 6 (six) hours as needed for wheezing or shortness of breath. 1 Inhaler 5  . aspirin 81 MG tablet Take 81 mg by mouth daily.    . calcitonin, salmon, (MIACALCIN/FORTICAL) 200 UNIT/ACT nasal spray Place 1 spray into alternate nostrils daily. 3.7 mL 11  . Coenzyme Q10 (COQ10) 100 MG  CAPS Take 100 mg by mouth daily.     Marland Kitchen EPINEPHrine 0.3 mg/0.3 mL IJ SOAJ injection Reported on 03/14/2015    . Garlic (GARLIQUE PO) Take 1 tablet by mouth daily.    . Glucosamine-Chondroitin (OSTEO BI-FLEX REGULAR STRENGTH PO) Take 2 tablets by mouth daily with supper.    Marland Kitchen glucose blood test strip ONE TOUCH ULTRA MINI Use as instructed to check sugar 2-3 daily. Dx: 250.00 100 each 3  . levocetirizine (XYZAL) 5 MG tablet TAKE ONE TABLET BY MOUTH EACH EVENING 30 tablet 5  . losartan (COZAAR) 50 MG tablet Take 1 tablet (50 mg total) by mouth daily. 30 tablet 11  . Magnesium 250 MG TABS Take 250 mg by mouth daily. Reported on 03/14/2015    . Melatonin 5 MG TABS Take 5 mg by mouth at bedtime.     . metFORMIN (GLUCOPHAGE) 500 MG tablet TAKE 1 TABLET BY MOUTH DAILY WITH BREAKFAST 90 tablet 3  . montelukast (SINGULAIR) 10 MG tablet TAKE ONE TABLET BY MOUTH EVERY NIGHT AT BEDTIME 30 tablet 2  . Multiple Vitamin  (MULTIVITAMIN WITH MINERALS) TABS tablet Take 1 tablet by mouth daily.    . Multiple Vitamins-Minerals (EYE VITAMINS) CAPS Take 1 capsule by mouth daily.    . NONFORMULARY OR COMPOUNDED ITEM Allergy Vaccine 1:50 Given at [facility]    . omeprazole (PRILOSEC) 40 MG capsule Take 1 capsule (40 mg total) by mouth 2 (two) times daily. 60 capsule 11  . Polyvinyl Alcohol-Povidone (REFRESH OP) Place 1 drop into both eyes daily.     . rosuvastatin (CRESTOR) 5 MG tablet Take as directed by lipid clinic. Start with  twice weekly. 30 tablet 3  . SYNTHROID 100 MCG tablet Take 1 tablet (100 mcg total) by mouth daily before breakfast. 30 tablet 11  . triamcinolone (NASACORT ALLERGY 24HR) 55 MCG/ACT AERO nasal inhaler Place 1 spray into both nostrils daily as needed (congestion). Reported on 03/14/2015    . triamterene-hydrochlorothiazide (DYAZIDE) 37.5-25 MG capsule TAKE ONE CAPSULE BY MOUTH EVERY MORNING 30 capsule 11  . Vitamin D, Cholecalciferol, 400 UNITS CAPS Take 1 capsule by mouth daily.     No current facility-administered medications on file prior to visit.    Allergies  Allergen Reactions  . Ace Inhibitors Cough  . Amlodipine Cough  . Crestor [Rosuvastatin] Other (See Comments)    myalgias  . Pregabalin Other (See Comments)    Unknown reaction  . Tegretol [Carbamazepine] Other (See Comments)    Dizziness, headache  . Wellbutrin [Bupropion Hcl] Other (See Comments)    Unknown reaction  . Cymbalta [Duloxetine Hcl] Palpitations and Other (See Comments)    headaches    Assessment/Plan: Dyslipidemia: LDL is not at goal.  Discussed options with patient and daughter.  She would like to consider clinical trial at this time if she meets inclusion criteria.  If not, she is willing to try fenofibrate.  Will review pt's chart to see if she may qualify for CLEAR.  Will follow up with patient once this is complete.

## 2015-06-07 ENCOUNTER — Encounter: Payer: Self-pay | Admitting: Internal Medicine

## 2015-06-09 NOTE — Telephone Encounter (Signed)
Pt states that she has been coughing a lot while using her nebulizer machine and has been having continual coughing after each treatment and produces mucus every time using the nebulizer. Pt states that mucus is clear - mucus is thick at times and others thin. Once she coughs up the mucus her coughing calms. Pt wants to know if this is normal? The only issue is that it seems to happen every time she uses her nebs and its the same amount of mucus and does not seem to be going away.  Pt is wanting to know if she can get her allergy vaccines on a weekly for the next couple weeks while allergies are so high.   Please advise Dr Maple HudsonYoung. Thanks.

## 2015-06-09 NOTE — Telephone Encounter (Signed)
4.15.17 mychart email from pt: Message     Dr. Maple HudsonYoung, I am using my nebulizer inhalation therapy once a day at evening. When I use it I cough a great deal but do    eventually spit up a little phlegm. Is this normal?         Also, is it possible for me to get allergy shots for the next couple weeks on a weekly basis rather than spread them out to two weeks in between?         Thanks for your help, Ronda FairlyJean C. Ladona Ridgelaylor    Since patient mentioned that she is coughing up phlegm, have called pt to discuss LMOM TCB x1

## 2015-06-09 NOTE — Telephone Encounter (Signed)
It is quite common to have some cough after nebulizer treatment as you describe. You might experiment with whether it would be better if you did this twice daily.  It can be very appropriate to come weekly for allergy shots during the pollen season. You can arrange this with Dimas Millinammy Scott in the allergy lab.

## 2015-06-10 NOTE — Telephone Encounter (Signed)
Mrs. Jordan Patterson when you come in for your shot I will have go around to the schedulers are and make weekly appts..(per Dr.Young)

## 2015-06-10 NOTE — Telephone Encounter (Signed)
Responded to pt e-mail. Nothing further needed.

## 2015-06-17 ENCOUNTER — Ambulatory Visit (INDEPENDENT_AMBULATORY_CARE_PROVIDER_SITE_OTHER): Payer: Medicare Other | Admitting: *Deleted

## 2015-06-17 DIAGNOSIS — J309 Allergic rhinitis, unspecified: Secondary | ICD-10-CM | POA: Diagnosis not present

## 2015-07-01 ENCOUNTER — Ambulatory Visit (INDEPENDENT_AMBULATORY_CARE_PROVIDER_SITE_OTHER): Payer: Medicare Other | Admitting: *Deleted

## 2015-07-01 DIAGNOSIS — J309 Allergic rhinitis, unspecified: Secondary | ICD-10-CM

## 2015-07-02 ENCOUNTER — Encounter: Payer: Self-pay | Admitting: Internal Medicine

## 2015-07-07 ENCOUNTER — Ambulatory Visit: Payer: Medicare Other

## 2015-07-08 ENCOUNTER — Ambulatory Visit (INDEPENDENT_AMBULATORY_CARE_PROVIDER_SITE_OTHER): Payer: Medicare Other | Admitting: *Deleted

## 2015-07-08 DIAGNOSIS — J309 Allergic rhinitis, unspecified: Secondary | ICD-10-CM

## 2015-07-09 ENCOUNTER — Ambulatory Visit: Payer: Medicare Other

## 2015-07-10 ENCOUNTER — Encounter: Payer: Self-pay | Admitting: Internal Medicine

## 2015-07-14 ENCOUNTER — Ambulatory Visit: Payer: Medicare Other

## 2015-07-16 ENCOUNTER — Ambulatory Visit (INDEPENDENT_AMBULATORY_CARE_PROVIDER_SITE_OTHER): Payer: Medicare Other | Admitting: Internal Medicine

## 2015-07-16 ENCOUNTER — Ambulatory Visit (INDEPENDENT_AMBULATORY_CARE_PROVIDER_SITE_OTHER): Payer: Medicare Other | Admitting: *Deleted

## 2015-07-16 ENCOUNTER — Ambulatory Visit (INDEPENDENT_AMBULATORY_CARE_PROVIDER_SITE_OTHER)
Admission: RE | Admit: 2015-07-16 | Discharge: 2015-07-16 | Disposition: A | Discharge status: Home / Self Care | Payer: Medicare Other | Source: Ambulatory Visit | Attending: Internal Medicine | Admitting: Internal Medicine

## 2015-07-16 ENCOUNTER — Encounter: Payer: Self-pay | Admitting: Internal Medicine

## 2015-07-16 VITALS — BP 120/68 | HR 82 | Ht 68.0 in | Wt 186.2 lb

## 2015-07-16 DIAGNOSIS — J4520 Mild intermittent asthma, uncomplicated: Secondary | ICD-10-CM | POA: Diagnosis not present

## 2015-07-16 DIAGNOSIS — R059 Cough, unspecified: Secondary | ICD-10-CM

## 2015-07-16 DIAGNOSIS — R05 Cough: Secondary | ICD-10-CM

## 2015-07-16 DIAGNOSIS — J309 Allergic rhinitis, unspecified: Secondary | ICD-10-CM | POA: Diagnosis not present

## 2015-07-16 DIAGNOSIS — K219 Gastro-esophageal reflux disease without esophagitis: Secondary | ICD-10-CM

## 2015-07-16 MED ORDER — FLUTICASONE FUROATE-VILANTEROL 100-25 MCG/INH IN AEPB: 1.0000 | INHALATION_SPRAY | Freq: Every day | RESPIRATORY_TRACT | Status: DC

## 2015-07-16 NOTE — Assessment & Plan Note (Signed)
Emphasize reflux precautions 

## 2015-07-16 NOTE — Patient Instructions (Signed)
Order- CXR    Dx cough  Sample Breo Ellipta   Inhale 1 puff, once daily, then rinse mouth  Please call as needed

## 2015-07-16 NOTE — Progress Notes (Signed)
11/29/13- 89 yoF never smoker followed here by Dr Craige CottaSood FOLLOW FOR:  Allergy consult; has been seeing Dr. [place] CallasSharma, but wanted to find someone closer to her home. Referred to Dr. Maple HudsonYoung by Dr. Craige CottaSood History of allergic rhinitis/hay fever which was originally worse in the fall but is now perennial. Allergy skin testing 09/19/2012 by Dr. Collene MaresSharma-positive for dust mite, cockroach, tree pollens, grass pollens, cat She began allergy vaccine in August of 2014 and feels that has helped, with no adverse reactions. She anticipates continuing allergy vaccine therapy. She has not had adequate control with Nasacort, Xyzal,  Her asthma is managed by Dr. Craige CottaSood with albuterol , Singulair.  She denies history of adverse reactions to insect stings or specific foods and does not have a significant history of cutaneous allergy. GERD is managed with omeprazole twice daily but she still is aware of reflux events.  02/05/14-73 yoF never smoker followed here by Dr Craige CottaSood FOLLOWS FOR: Pt states she is doing well overall with allergies. Continues allergy vaccine. Denies any wheeze. Has a rescue inhaler, not needed. Using Nasacort. Feeling some pressure right ear without loss of hearing.  02/04/15- 75 year old female never smoker followed for allergic rhinitis, asthma, complicated by GERD, hypothyroid, DM 2 Allergy Vaccine FOLLOWS FOR: Pt c/o increased wheezing and coughing. SOB at times. Tolerating allergy vaccines well.   03/12/2015-75 year old female never smoker followed for allergic rhinitis, asthma, complicated by GERD, hypothyroid, DM 2 Allergy Vaccine 1:10 GH every 2 weeks Follow For: Pt c/o increased wheezing, coughing and SOB. Coughing up a small amount of white phlegm first thing in the morning. Tolerating allergy vaccines well.  As Nasacort nasal spray if needed. Uses albuterol rescue inhaler about once daily, most days.  07/16/2015-75 year old female never smoker followed for allergic rhinitis, asthma, complicated  by GERD, hypothyroid, DM 2 Allergy Vaccine 1:10 GH  FOLLOW FOR: allergies; patient is coming in weekly now, allergies have not been doing well with the weather change.  cough, thick clear mucus after using neb  More cough in the spring with scant white sputum and no infection. She went back to getting allergy shots every week instead of every other week. Using nebulizer about once daily. Little wheeze. Little upper airway discomfort. Cough is not worse at night or with eating but is increased by bending over. Only occasional use of rescue inhaler.  ROS-see HPI Constitutional:   No-   weight loss, night sweats, fevers, chills, fatigue, lassitude. HEENT:   No-  headaches, difficulty swallowing, tooth/dental problems, sore throat,       +sneezing, itching, ear ache, nasal congestion, post nasal drip,  CV:  No-   chest pain, orthopnea, PND, swelling in lower extremities, anasarca,                                                          dizziness, palpitations Resp: No-   shortness of breath with exertion or at rest.              No-   productive cough,  No non-productive cough,  No- coughing up of blood.              No-   change in color of mucus.+ wheezing.   Skin: No-   rash or lesions. GI:  +  heartburn, indigestion, No-abdominal pain, nausea,  vomiting, GU:  MS:  No-   joint pain or swelling.   Neuro-     nothing unusual Psych:  No- change in mood or affect. No depression or anxiety.  No memory loss.  OBJ- Physical Exam General- Alert, Oriented, Affect-appropriate, Distress- none acute Skin- rash-none, lesions- none, excoriation- none Lymphadenopathy- none Head- atraumatic            Eyes- Gross vision intact, PERRLA, conjunctivae and secretions clear            Ears- clear            Nose- Clear, no-Septal dev, mucus, polyps, erosion, perforation             Throat- Mallampati II , mucosa clear , drainage+white, tonsils- atrophic Neck- flexible , trachea midline, no stridor , thyroid  nl, carotid no bruit Chest - symmetrical excursion , unlabored           Heart/CV- RRR , no murmur , no gallop  , no rub, nl s1 s2                           - JVD- none , edema- none, stasis changes- none, varices- none           Lung- clear to P&A, wheeze- none, cough + light , dullness-none, rub- none           Chest wall-  Abd-  Br/ Gen/ Rectal- Not done, not indicated Extrem- cyanosis- none, clubbing, none, atrophy- none, strength- nl Neuro- grossly intact to observation

## 2015-07-16 NOTE — Assessment & Plan Note (Signed)
Mild exacerbation this spring. Would like better control. Plan-chest x-ray, sample trial maintenance controller inhaler Breo 100

## 2015-07-17 ENCOUNTER — Encounter: Payer: Self-pay | Admitting: Family Medicine

## 2015-07-18 ENCOUNTER — Other Ambulatory Visit: Payer: Self-pay | Admitting: Family Medicine

## 2015-07-22 ENCOUNTER — Ambulatory Visit (INDEPENDENT_AMBULATORY_CARE_PROVIDER_SITE_OTHER): Payer: Medicare Other | Admitting: *Deleted

## 2015-07-22 DIAGNOSIS — J309 Allergic rhinitis, unspecified: Secondary | ICD-10-CM

## 2015-07-28 ENCOUNTER — Ambulatory Visit (INDEPENDENT_AMBULATORY_CARE_PROVIDER_SITE_OTHER): Payer: Medicare Other | Admitting: *Deleted

## 2015-07-28 DIAGNOSIS — J309 Allergic rhinitis, unspecified: Secondary | ICD-10-CM | POA: Diagnosis not present

## 2015-07-30 ENCOUNTER — Ambulatory Visit: Payer: Medicare Other

## 2015-08-04 ENCOUNTER — Ambulatory Visit (INDEPENDENT_AMBULATORY_CARE_PROVIDER_SITE_OTHER): Payer: Medicare Other | Admitting: *Deleted

## 2015-08-04 DIAGNOSIS — J309 Allergic rhinitis, unspecified: Secondary | ICD-10-CM

## 2015-08-06 ENCOUNTER — Ambulatory Visit: Payer: Medicare Other

## 2015-08-06 ENCOUNTER — Other Ambulatory Visit: Payer: Self-pay | Admitting: Internal Medicine

## 2015-08-07 ENCOUNTER — Encounter: Payer: Self-pay | Admitting: Family Medicine

## 2015-08-07 ENCOUNTER — Ambulatory Visit (INDEPENDENT_AMBULATORY_CARE_PROVIDER_SITE_OTHER): Payer: Medicare Other | Admitting: Family Medicine

## 2015-08-07 VITALS — BP 142/86 | HR 88 | Temp 97.7°F | Wt 183.0 lb

## 2015-08-07 DIAGNOSIS — I10 Essential (primary) hypertension: Secondary | ICD-10-CM | POA: Diagnosis not present

## 2015-08-07 DIAGNOSIS — E785 Hyperlipidemia, unspecified: Secondary | ICD-10-CM | POA: Diagnosis not present

## 2015-08-07 DIAGNOSIS — E119 Type 2 diabetes mellitus without complications: Secondary | ICD-10-CM | POA: Diagnosis not present

## 2015-08-07 DIAGNOSIS — M4854XS Collapsed vertebra, not elsewhere classified, thoracic region, sequela of fracture: Secondary | ICD-10-CM

## 2015-08-07 DIAGNOSIS — S22080S Wedge compression fracture of T11-T12 vertebra, sequela: Secondary | ICD-10-CM

## 2015-08-07 DIAGNOSIS — M8080XD Other osteoporosis with current pathological fracture, unspecified site, subsequent encounter for fracture with routine healing: Secondary | ICD-10-CM

## 2015-08-07 DIAGNOSIS — K219 Gastro-esophageal reflux disease without esophagitis: Secondary | ICD-10-CM

## 2015-08-07 DIAGNOSIS — E039 Hypothyroidism, unspecified: Secondary | ICD-10-CM

## 2015-08-07 LAB — BASIC METABOLIC PANEL
BUN: 14 mg/dL (ref 6–23)
CO2: 28 mEq/L (ref 19–32)
Calcium: 9.6 mg/dL (ref 8.4–10.5)
Chloride: 99 mEq/L (ref 96–112)
Creatinine, Ser: 0.83 mg/dL (ref 0.40–1.20)
GFR: 71.27 mL/min (ref >60.00)
Glucose, Bld: 111 mg/dL — ABNORMAL HIGH (ref 70–99)
Potassium: 4.3 mEq/L (ref 3.5–5.1)
Sodium: 136 mEq/L (ref 135–145)

## 2015-08-07 LAB — HEMOGLOBIN A1C: Hgb A1c MFr Bld: 7.2 % — ABNORMAL HIGH (ref 4.6–6.5)

## 2015-08-07 MED ORDER — DENOSUMAB 60 MG/ML ~~LOC~~ SOLN: 60.0000 mg | SUBCUTANEOUS | Status: DC

## 2015-08-07 NOTE — Assessment & Plan Note (Addendum)
Reviewed dosing and administration. Continue synthroid daily DAW

## 2015-08-07 NOTE — Progress Notes (Signed)
BP 142/86 mmHg  Pulse 88  Temp(Src) 97.7 F (36.5 C) (Oral)  Wt 183 lb (83.008 kg)   CC: 106mo f/u visit  Subjective:    Patient ID: Jordan Patterson, female    DOB: Mar 29, 1940, 75 y.o.   MRN: [id number]  HPI: Jordan Patterson is a 75 y.o. female presenting on 08/07/2015 for Follow-up   Osteoporosis - on calcitonin. Unsure if getting in her system due to severe allergic rhinitis. Requests transition to pill. Asks about prolia.   HTN - Compliant with current antihypertensive regimen of losartan  daily and triamterene hctz 37.5mg /25mg  daily. Does not check blood pressures at home. No low blood pressure readings or symptoms of dizziness/syncope. Denies HA, vision changes, CP/tightness, SOB, leg swelling.   DM - regularly does not check sugars. Compliant with antihyperglycemic regimen which includes: metformin  daily. Denies low sugars or hypoglycemic symptoms. Denies paresthesias. Last diabetic eye exam 04/2014. Pneumovax: 2007. Prevnar: 2017. Lab Results  Component Value Date   HGBA1C 7.0* 02/03/2015   Diabetic Foot Exam - Simple   Simple Foot Form  Diabetic Foot exam was performed with the following findings:  Yes 08/07/2015  1:40 PM  Visual Inspection  No deformities, no ulcerations, no other skin breakdown bilaterally:  Yes  Sensation Testing  Intact to touch and monofilament testing bilaterally:  Yes  Pulse Check  Posterior Tibialis and Dorsalis pulse intact bilaterally:  Yes  Comments       Hypothyroidism - compliant with synthroid daily.  Lab Results  Component Value Date   TSH 3.24 02/03/2015    HLD not on statins. crestor caused myalgias. She has established with lipid clinic at cardiology office.   Relevant past medical, surgical, family and social history reviewed and updated as indicated. Interim medical history since our last visit reviewed. Allergies and medications reviewed and updated. Current Outpatient Prescriptions on File Prior to Visit    Medication Sig  . albuterol (PROVENTIL) (2.5 MG/3ML) 0.083% nebulizer solution Take 3 mLs (2.5 mg total) by nebulization every 6 (six) hours as needed for wheezing or shortness of breath.  Marland Kitchen albuterol (VENTOLIN HFA) 108 (90 BASE) MCG/ACT inhaler Inhale 2 puffs into the lungs every 6 (six) hours as needed for wheezing or shortness of breath.  Marland Kitchen aspirin 81 MG tablet Take 81 mg by mouth daily.  . calcitonin, salmon, (MIACALCIN/FORTICAL) 200 UNIT/ACT nasal spray PLACE ONE SPRAY INTO ALTERNATE NOSTRILS DAILY  . Coenzyme Q10 (COQ10) 100 MG CAPS Take 100 mg by mouth daily.   . Garlic (GARLIQUE PO) Take 1 tablet by mouth daily.  . Glucosamine-Chondroitin (OSTEO BI-FLEX REGULAR STRENGTH PO) Take 2 tablets by mouth daily with supper.  Marland Kitchen glucose blood test strip ONE TOUCH ULTRA MINI Use as instructed to check sugar 2-3 daily. Dx: 250.00  . levocetirizine (XYZAL) 5 MG tablet TAKE ONE TABLET BY MOUTH EACH EVENING  . losartan (COZAAR) 50 MG tablet Take 1 tablet (50 mg total) by mouth daily.  . Melatonin 5 MG TABS Take 5 mg by mouth at bedtime.   . metFORMIN (GLUCOPHAGE) 500 MG tablet TAKE 1 TABLET BY MOUTH DAILY WITH BREAKFAST  . montelukast (SINGULAIR) 10 MG tablet TAKE ONE TABLET BY MOUTH EVERY NIGHT AT BEDTIME  . Multiple Vitamin (MULTIVITAMIN WITH MINERALS) TABS tablet Take 1 tablet by mouth daily.  . Multiple Vitamins-Minerals (EYE VITAMINS) CAPS Take 1 capsule by mouth daily.  . NONFORMULARY OR COMPOUNDED ITEM Allergy Vaccine 1:50 Given at [facility]  . omeprazole (PRILOSEC) 40  MG capsule Take 1 capsule (40 mg total) by mouth 2 (two) times daily.  . Polyvinyl Alcohol-Povidone (REFRESH OP) Place 1 drop into both eyes daily.   . sodium chloride (BRONCHO SALINE) inhaler solution Take 1 spray by nebulization as needed.  Marland Kitchen. SYNTHROID 100 MCG tablet Take 1 tablet (100 mcg total) by mouth daily before breakfast.  . triamterene-hydrochlorothiazide (DYAZIDE) 37.5-25 MG capsule TAKE ONE CAPSULE BY MOUTH  EVERY MORNING  . Vitamin D, Cholecalciferol, 400 UNITS CAPS Take 1 capsule by mouth daily.   No current facility-administered medications on file prior to visit.    Review of Systems Per HPI unless specifically indicated in ROS section     Objective:    BP 142/86 mmHg  Pulse 88  Temp(Src) 97.7 F (36.5 C) (Oral)  Wt 183 lb (83.008 kg)  Wt Readings from Last 3 Encounters:  08/07/15 183 lb (83.008 kg)  07/16/15 186 lb 3.2 oz (84.46 kg)  03/26/15 185 lb (83.915 kg)    Physical Exam  Constitutional: She appears well-developed and well-nourished. No distress.  HENT:  Head: Normocephalic and atraumatic.  Mouth/Throat: Oropharynx is clear and moist. No oropharyngeal exudate.  Eyes: Conjunctivae and EOM are normal. Pupils are equal, round, and reactive to light. No scleral icterus.  Neck: Normal range of motion. Neck supple. No thyromegaly present.  Cardiovascular: Normal rate, regular rhythm, normal heart sounds and intact distal pulses.   No murmur heard. Pulmonary/Chest: Effort normal and breath sounds normal. No respiratory distress. She has no wheezes. She has no rales.  Musculoskeletal: She exhibits no edema.  See HPI for foot exam if done  Lymphadenopathy:    She has no cervical adenopathy.  Skin: Skin is warm and dry. No rash noted.  Psychiatric: She has a normal mood and affect.  Nursing note and vitals reviewed.  Results for orders placed or performed in visit on 08/07/15  HM DIABETES EYE EXAM  Result Value Ref Range   HM Diabetic Eye Exam No Retinopathy No Retinopathy      Assessment & Plan:   Problem List Items Addressed This Visit    GERD (gastroesophageal reflux disease)    Continue PPI BID.       Hypertension    Chronic, overall stable. Continue current regimen.       Hyperlipidemia    Has established with lipid clinic. Discussing options, possible lipid trial.       Hypothyroidism    Reviewed dosing and administration. Continue synthroid 100mcg  daily DAW      Osteoporosis with fracture    Osteopenia by dexa but with compression fracture s/p kypholpasty. She has been on calcitonin for last 1 year.  Requests different medication. Discussed options - oral bisphosphonate likely not good option given her severe GERD and HH so will see if insurance will cover prolia. I have asked kim to start PA for prolia.       Diabetes type 2, controlled (HCC) - Primary    Chronic, stable. Continue metformin. Foot exam today. UTD eye exam. Have requested records.      Relevant Orders   Basic metabolic panel   Hemoglobin A1c   Compression fracture of T12 vertebra (HCC)    Continues back brace. Continues slow recovery, s/p 1 year mark from kyphoplasty and endorses doing better.          Follow up plan: Return in about 6 months (around 02/06/2016) for medicare wellness visit.  Eustaquio BoydenJavier Savayah Waltrip, MD

## 2015-08-07 NOTE — Patient Instructions (Addendum)
Sign release of records for [facility] exam report [facility] Labs today We will start on prior auth for prolia Good to see you today, call us with questions. Return as needed or in 6 months for medicare wellness visit

## 2015-08-07 NOTE — Assessment & Plan Note (Signed)
Osteopenia by dexa but with compression fracture s/p kypholpasty. She has been on calcitonin for last 1 year.  Requests different medication. Discussed options - oral bisphosphonate likely not good option given her severe GERD and HH so will see if insurance will cover prolia. I have asked kim to start PA for prolia.

## 2015-08-07 NOTE — Assessment & Plan Note (Addendum)
Chronic, stable. Continue metformin. Foot exam today. UTD eye exam. Have requested records.

## 2015-08-07 NOTE — Assessment & Plan Note (Signed)
Chronic, overall stable. Continue current regimen.  °

## 2015-08-07 NOTE — Assessment & Plan Note (Signed)
Continues back brace. Continues slow recovery, s/p 1 year mark from kyphoplasty and endorses doing better.

## 2015-08-07 NOTE — Progress Notes (Signed)
Pre visit review using our clinic review tool, if applicable. No additional management support is needed unless otherwise documented below in the visit note. 

## 2015-08-07 NOTE — Assessment & Plan Note (Signed)
Continue PPI BID 

## 2015-08-07 NOTE — Assessment & Plan Note (Signed)
Has established with lipid clinic. Discussing options, possible lipid trial.

## 2015-08-08 ENCOUNTER — Encounter: Payer: Self-pay | Admitting: Family Medicine

## 2015-08-11 ENCOUNTER — Telehealth: Payer: Self-pay | Admitting: Family Medicine

## 2015-08-11 NOTE — Telephone Encounter (Signed)
Thank you :)

## 2015-08-11 NOTE — Telephone Encounter (Signed)
I have electronically submitted Jordan Patterson's info for [employer] insurance verification and will notify you once I have a response.  Thank you!

## 2015-08-18 ENCOUNTER — Ambulatory Visit (INDEPENDENT_AMBULATORY_CARE_PROVIDER_SITE_OTHER): Payer: Medicare Other | Admitting: *Deleted

## 2015-08-18 ENCOUNTER — Encounter: Payer: Self-pay | Admitting: Family Medicine

## 2015-08-18 DIAGNOSIS — J309 Allergic rhinitis, unspecified: Secondary | ICD-10-CM

## 2015-08-18 MED ORDER — METFORMIN HCL 500 MG PO TABS: 500.0000 mg | ORAL_TABLET | Freq: Two times a day (BID) | ORAL | Status: DC

## 2015-08-25 ENCOUNTER — Ambulatory Visit (INDEPENDENT_AMBULATORY_CARE_PROVIDER_SITE_OTHER): Payer: Medicare Other

## 2015-08-25 DIAGNOSIS — J309 Allergic rhinitis, unspecified: Secondary | ICD-10-CM

## 2015-08-27 ENCOUNTER — Encounter: Payer: Self-pay | Admitting: Family Medicine

## 2015-08-27 NOTE — Telephone Encounter (Signed)
I have rec'd Ms. Spoonemore's Engineer, manufacturing systemsinsurance verification for Prolia and w/out an OV she will have an estimated responsibility of $50; w/an OV she will have an estimated responsibility of $85.  Please make pt aware this is an estimate and we will not know an exact amt until insurance(s) has/have paid.  I have sent a copy of the summary of benefits to be scanned into pt's chart.    Once pt recs injection, please let me know actual injection date so I can update the Prolia portal.  If you have any questions, please let me know.  Thank you!  Cc: Karie GeorgesShannon Bridges (for ordering purposes)

## 2015-08-28 ENCOUNTER — Encounter: Payer: Self-pay | Admitting: Family Medicine

## 2015-08-28 ENCOUNTER — Ambulatory Visit (INDEPENDENT_AMBULATORY_CARE_PROVIDER_SITE_OTHER): Payer: Medicare Other | Admitting: Family Medicine

## 2015-08-28 ENCOUNTER — Telehealth: Payer: Self-pay

## 2015-08-28 VITALS — BP 158/84 | HR 88 | Temp 98.1°F | Wt 182.5 lb

## 2015-08-28 DIAGNOSIS — R053 Chronic cough: Secondary | ICD-10-CM

## 2015-08-28 DIAGNOSIS — R05 Cough: Secondary | ICD-10-CM

## 2015-08-28 DIAGNOSIS — H6121 Impacted cerumen, right ear: Secondary | ICD-10-CM | POA: Diagnosis not present

## 2015-08-28 DIAGNOSIS — H6120 Impacted cerumen, unspecified ear: Secondary | ICD-10-CM | POA: Insufficient documentation

## 2015-08-28 NOTE — Telephone Encounter (Signed)
Patient is on the list for [employer] 2017 and may be a good candidate for an AWV in 2017. Please let me know if/when appt is scheduled.   

## 2015-08-28 NOTE — Progress Notes (Signed)
Subjective:  Patient ID: Roxy HorsemanJean C Cleavenger, female    DOB: 12-08-1940  Age: 75 y.o. MRN: [id number]  CC: Ear clogged, cough  HPI:  75 year old female with GERD/hiatal hernia, asthma presents with the above complaints.  Patient states that she's had difficulty hearing particularly in the right ear since Sunday. She been using over-the-counter Debrox without resolution. No reports of fevers or chills. No known exacerbating factors.  Additionally, patient reports that she has had worsening cough as of late. It appears that she has chronic cough secondary to several things - asthma, GERD, hiatal hernia etc. No recent shortness of breath. No known exacerbating or relieving factors. No other complaints at this time.  Social Hx   Social History   Social History  . Marital Status: Widowed    Spouse Name: N/A  . Number of Children: 3  . Years of Education: N/A   Occupational History  . Retired    Social History Main Topics  . Smoking status: Never Smoker   . Smokeless tobacco: Never Used  . Alcohol Use: No  . Drug Use: No  . Sexual Activity: Not Asked   Other Topics Concern  . 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   Respiratory: Positive for cough.    Objective:  BP 158/84 mmHg  Pulse 88  Temp(Src) 98.1 F (36.7 C) (Oral)  Wt 182 lb 8 oz (82.781 kg)  SpO2 94%  BP/Weight 08/28/2015 08/07/2015 07/16/2015  Systolic BP 158 142 120  Diastolic BP 84 86 68  Wt. (Lbs) 182.5 183 186.2  BMI 27.76 27.83 28.32   Physical Exam  Constitutional: She is oriented to person, place, and time. She appears well-developed. No distress.  HENT:  Head:  Normocephalic and atraumatic.  R TM obscured by cerumen. Left TM partially obscured by cerumen.  Cardiovascular: Normal rate and regular rhythm.   Soft systolic murmur.  Pulmonary/Chest: Effort normal. She has no wheezes. She has no rales.  Neurological: She is alert and oriented to person, place, and time.  Psychiatric: She has a normal mood and affect.  Vitals reviewed.  Lab Results  Component Value Date   WBC 6.1 08/30/2014   HGB 13.4 08/30/2014   HCT 39.1 08/30/2014   PLT 170 08/30/2014   GLUCOSE 111* 08/07/2015   CHOL 262* 05/27/2015   TRIG 162* 05/27/2015   HDL 43* 05/27/2015   LDLDIRECT 154.0 05/23/2014   LDLCALC 187* 05/27/2015   ALT 20 05/27/2015   AST 21 05/27/2015   NA 136 08/07/2015   K 4.3 08/07/2015   CL 99 08/07/2015   CREATININE 0.83 08/07/2015   BUN 14 08/07/2015   CO2 28 08/07/2015   TSH 3.24 02/03/2015   HGBA1C 7.2* 08/07/2015   MICROALBUR <0.7 05/28/2014   Assessment & Plan:   Problem List Items Addressed This Visit    Chronic cough    Patient with a history of chronic cough. Recent worsening. Exam unremarkable. Likely multifactorial in nature. Continue current medications. Follow up with primary if fails to improve or worsens.      Cerumen impaction - Primary    New problem.  Ear irrigation performed successfully today. Advise continue use of over-the-counter Debrox.        Follow-up: PRN  Everlene OtherJayce Thao Vanover DO [facility]

## 2015-08-28 NOTE — Assessment & Plan Note (Signed)
New problem. Ear irrigation performed successfully today. Advise continue use of over-the-counter Debrox.

## 2015-08-28 NOTE — Patient Instructions (Signed)
Continue use of Debrox.  Continue your current medications.  If the cough worsens, please follow up with Dr. Reece AgarG.  Take care  Dr. Adriana Simasook

## 2015-08-28 NOTE — Assessment & Plan Note (Signed)
Patient with a history of chronic cough. Recent worsening. Exam unremarkable. Likely multifactorial in nature. Continue current medications. Follow up with primary if fails to improve or worsens.

## 2015-08-29 ENCOUNTER — Encounter: Payer: Self-pay | Admitting: Internal Medicine

## 2015-08-29 ENCOUNTER — Ambulatory Visit (INDEPENDENT_AMBULATORY_CARE_PROVIDER_SITE_OTHER): Payer: Medicare Other | Admitting: Internal Medicine

## 2015-08-29 ENCOUNTER — Ambulatory Visit: Payer: Medicare Other | Admitting: Family Medicine

## 2015-08-29 VITALS — BP 126/80 | HR 80 | Temp 98.0°F | Wt 182.0 lb

## 2015-08-29 DIAGNOSIS — H6121 Impacted cerumen, right ear: Secondary | ICD-10-CM | POA: Diagnosis not present

## 2015-08-29 NOTE — Progress Notes (Signed)
Subjective:    Patient ID: Jordan Patterson, female    DOB: 09/06/1940, 75 y.o.   MRN: [id number]  HPI Here due to ongoing ear issues With daughter  Despite irrigation yesterday--still can't hear out of her right ear Ongoing cough--not new (but seems worse) Bringing up colored mucus for the past 2 weeks or so No fever Some wheezing in past few days--more than her normal (uses nebulizer daily) No severe SOB No apparent post nasal drip Not really congested in nose  Current Outpatient Prescriptions on File Prior to Visit  Medication Sig Dispense Refill  . albuterol (PROVENTIL) (2.5 MG/3ML) 0.083% nebulizer solution Take 3 mLs (2.5 mg total) by nebulization every 6 (six) hours as needed for wheezing or shortness of breath. 75 mL 12  . albuterol (VENTOLIN HFA) 108 (90 BASE) MCG/ACT inhaler Inhale 2 puffs into the lungs every 6 (six) hours as needed for wheezing or shortness of breath. 1 Inhaler 5  . aspirin 81 MG tablet Take 81 mg by mouth daily.    . calcitonin, salmon, (MIACALCIN/FORTICAL) 200 UNIT/ACT nasal spray PLACE ONE SPRAY INTO ALTERNATE NOSTRILS DAILY 3.7 mL 3  . Coenzyme Q10 (COQ10) 100 MG CAPS Take 100 mg by mouth daily.     Marland Kitchen denosumab (PROLIA) 60 MG/ML SOLN injection Inject 60 mg into the skin every 6 (six) months. Administer in upper arm, thigh, or abdomen 1.8 mL 1  . Garlic (GARLIQUE PO) Take 1 tablet by mouth daily.    . Glucosamine-Chondroitin (OSTEO BI-FLEX REGULAR STRENGTH PO) Take 2 tablets by mouth daily with supper.    Marland Kitchen glucose blood test strip ONE TOUCH ULTRA MINI Use as instructed to check sugar 2-3 daily. Dx: 250.00 100 each 3  . levocetirizine (XYZAL) 5 MG tablet TAKE ONE TABLET BY MOUTH EACH EVENING 30 tablet 5  . losartan (COZAAR) 50 MG tablet Take 1 tablet (50 mg total) by mouth daily. 30 tablet 11  . Melatonin 5 MG TABS Take 5 mg by mouth at bedtime.     . metFORMIN (GLUCOPHAGE) 500 MG tablet Take 1 tablet (500 mg total) by mouth 2 (two) times daily with a  meal. 180 tablet 3  . montelukast (SINGULAIR) 10 MG tablet TAKE ONE TABLET BY MOUTH EVERY NIGHT AT BEDTIME 30 tablet 5  . Multiple Vitamin (MULTIVITAMIN WITH MINERALS) TABS tablet Take 1 tablet by mouth daily.    . Multiple Vitamins-Minerals (EYE VITAMINS) CAPS Take 1 capsule by mouth daily.    . NONFORMULARY OR COMPOUNDED ITEM Allergy Vaccine 1:50 Given at [facility]    . omeprazole (PRILOSEC) 40 MG capsule Take 1 capsule (40 mg total) by mouth 2 (two) times daily. 60 capsule 11  . Polyvinyl Alcohol-Povidone (REFRESH OP) Place 1 drop into both eyes daily.     . sodium chloride (BRONCHO SALINE) inhaler solution Take 1 spray by nebulization as needed.    Marland Kitchen SYNTHROID 100 MCG tablet Take 1 tablet (100 mcg total) by mouth daily before breakfast. 30 tablet 11  . triamterene-hydrochlorothiazide (DYAZIDE) 37.5-25 MG capsule TAKE ONE CAPSULE BY MOUTH EVERY MORNING 30 capsule 11  . Vitamin D, Cholecalciferol, 400 UNITS CAPS Take 1 capsule by mouth daily.     No current facility-administered medications on file prior to visit.    Allergies  Allergen Reactions  . Ace Inhibitors Cough  . Amlodipine Cough  . Crestor [Rosuvastatin] Other (See Comments)    myalgias  . Pregabalin Other (See Comments)    Unknown reaction  . Tegretol [  Carbamazepine] Other (See Comments)    Dizziness, headache  . Wellbutrin [Bupropion Hcl] Other (See Comments)    Unknown reaction  . Cymbalta [Duloxetine Hcl] Palpitations and Other (See Comments)    headaches    Past Medical History  Diagnosis Date  . Hiatal hernia   . GERD (gastroesophageal reflux disease)     [name]([place]) EGD - mild esophageal dysmotility and small hiatal hernia  . Hypertension   . Extrinsic asthma   . Hyperlipidemia     mild, diet controlled  . Hypothyroidism   . Osteoporosis with fracture 07/2014    T -1.2 hip, T 0.2 spine, T12 compression fracture s/p kyphoplasty  . Diabetes type 2, controlled (HCC) 2004  . Macular degeneration   .  Arthritis     hands and knees - ?osteo  . Environmental allergies     dust,mold,mildew  . Hx of migraines   . Chronic headaches   . Allergic rhinitis   . Depression     pt denies  . Compression fracture of T12 vertebra (HCC) 07/18/2014    S/p kyphoplasty by Dr Newell CoralNudelman (08/2014)   . Asthma     Past Surgical History  Procedure Laterality Date  . Dilation and curettage of uterus  1978  . Tonsillectomy and adenoidectomy  1962  . Laparoscopy abdomen diagnostic      To R/O endometriosis  . Esophagogastroduodenoscopy  06/25/2011    Procedure: ESOPHAGOGASTRODUODENOSCOPY (EGD);  Surgeon: Hart Carwinora M Brodie, MD;  [facility];  Service: Endoscopy;  Laterality: N/A;  no Xray  . Savory dilation  06/25/2011    Procedure: SAVORY DILATION;  Surgeon: Hart Carwinora M Brodie, MD;  Location: [facility];  Service: Endoscopy;  Laterality: N/A;  . Cataract extraction  2003    bilateral with lens implant  . Dexa  05/2008    improved (initial DEXA 2007 - mild osteopenia)  . Colonoscopy    . Kyphoplasty N/A 08/30/2014    Procedure: THORACIC TWELVE KYPHOPLASTY;  Surgeon: Shirlean Kellyobert Nudelman, MD  . [place] echocardiography  10/2014    nl systolic function EF 55%, mild diastolic dysfunction    Family History  Problem Relation Age of Onset  . Mitral valve prolapse Father   . Diabetes Father   . Mitral valve prolapse Mother   . Hypertension Mother   . Stroke Mother [phone]    after valve surgery  . Stroke Paternal Grandfather   . Heart failure Maternal Grandfather   . Cancer Neg Hx   . Parkinson's disease Brother   . Other Brother     POST WAR TRAUMA    Social History   Social History  . Marital Status: Widowed    Spouse Name: N/A  . Number of Children: 3  . Years of Education: N/A   Occupational History  . Retired    Social History Main Topics  . Smoking status: Never Smoker   . Smokeless tobacco: Never Used  . Alcohol Use: No  . Drug Use: No  . Sexual Activity: Not on file   Other Topics Concern  .  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  Mouth/Throat: Oropharynx is clear and moist. No oropharyngeal exudate.  Left canal and TM normal Still with deep cerumen in canal on right  Cleared with further lavage and symptoms seemed better          Assessment & Plan:

## 2015-08-29 NOTE — Assessment & Plan Note (Signed)
Now resolved Discussed that if muffled hearing persisted, especially with ongoing productive cough, might want to consider antibiotic Discussed debrox 1-2 times per week to prevent further build up

## 2015-08-29 NOTE — Progress Notes (Signed)
Pre visit review using our clinic review tool, if applicable. No additional management support is needed unless otherwise documented below in the visit note. 

## 2015-09-01 ENCOUNTER — Telehealth: Payer: Self-pay | Admitting: Family Medicine

## 2015-09-01 NOTE — Telephone Encounter (Signed)
LM for pt to sch CPE and AWV in Dec 2017, [place]

## 2015-09-02 ENCOUNTER — Ambulatory Visit (INDEPENDENT_AMBULATORY_CARE_PROVIDER_SITE_OTHER): Payer: Medicare Other | Admitting: *Deleted

## 2015-09-02 DIAGNOSIS — J309 Allergic rhinitis, unspecified: Secondary | ICD-10-CM | POA: Diagnosis not present

## 2015-09-08 ENCOUNTER — Ambulatory Visit: Payer: Medicare Other

## 2015-09-09 ENCOUNTER — Ambulatory Visit: Payer: Medicare Other

## 2015-09-09 ENCOUNTER — Ambulatory Visit (INDEPENDENT_AMBULATORY_CARE_PROVIDER_SITE_OTHER): Payer: Medicare Other

## 2015-09-09 ENCOUNTER — Encounter: Payer: Self-pay | Admitting: Internal Medicine

## 2015-09-09 ENCOUNTER — Encounter: Payer: Self-pay | Admitting: Family Medicine

## 2015-09-09 DIAGNOSIS — J309 Allergic rhinitis, unspecified: Secondary | ICD-10-CM | POA: Diagnosis not present

## 2015-09-09 NOTE — Telephone Encounter (Signed)
Patient notified

## 2015-09-10 MED ORDER — GLUCOSE BLOOD VI STRP: ORAL_STRIP | Status: DC

## 2015-09-10 NOTE — Telephone Encounter (Signed)
Recommend we keep allergy vaccine strength where it is. It can be normal to cough some with nebulizer treatments, as the medication opens up tight airways to clear them out. If asthma control is not good enough, we can offer trial sample Breo Ellipta 100      Inhale 1 puff then rinse mouth, once daily every day.

## 2015-09-10 NOTE — Telephone Encounter (Signed)
CY - please advise. Thanks! 

## 2015-09-11 ENCOUNTER — Other Ambulatory Visit: Payer: Self-pay | Admitting: Internal Medicine

## 2015-09-12 ENCOUNTER — Telehealth: Payer: Self-pay | Admitting: Internal Medicine

## 2015-09-12 DIAGNOSIS — J309 Allergic rhinitis, unspecified: Secondary | ICD-10-CM | POA: Diagnosis not present

## 2015-09-12 NOTE — Telephone Encounter (Signed)
Allergy Serum Extract Date Mixed: 09/12/15 Vial: 2 Strength: 1:10 Here/Mail/Pick Up: here Mixed By: tbs Last OV: 07/16/15 Pending OV: 02/04/16

## 2015-09-16 ENCOUNTER — Ambulatory Visit (INDEPENDENT_AMBULATORY_CARE_PROVIDER_SITE_OTHER): Payer: Medicare Other | Admitting: *Deleted

## 2015-09-16 DIAGNOSIS — J309 Allergic rhinitis, unspecified: Secondary | ICD-10-CM

## 2015-09-18 ENCOUNTER — Encounter: Payer: Self-pay | Admitting: Internal Medicine

## 2015-09-18 NOTE — Telephone Encounter (Signed)
E-mail sent to patient informing her of CY's recommendations and asking for her to contact the office again if she is amenable to try these recs

## 2015-09-18 NOTE — Telephone Encounter (Signed)
We will try to handle this with an inhaled product containing a steroid for every days use.  Please offer sample and Rx Breo Ellipta 100   Inhale 1 puff, then rinse mouth, once daily every day. # 1, ref x 12

## 2015-09-18 NOTE — Telephone Encounter (Signed)
CY - please advise. Thanks! 

## 2015-09-19 NOTE — Telephone Encounter (Signed)
CY - please advise. Thanks! 

## 2015-09-22 NOTE — Telephone Encounter (Signed)
Suggest we try a different inhaled steroid- offer sample Dulera 200   Inhale 2 puffs, then rinse mouth, twice daily. If this doesn't help then we will need to try to get her in, perhaps with NP.

## 2015-09-23 ENCOUNTER — Ambulatory Visit (INDEPENDENT_AMBULATORY_CARE_PROVIDER_SITE_OTHER): Payer: Medicare Other | Admitting: *Deleted

## 2015-09-23 DIAGNOSIS — J309 Allergic rhinitis, unspecified: Secondary | ICD-10-CM | POA: Diagnosis not present

## 2015-09-30 ENCOUNTER — Ambulatory Visit (INDEPENDENT_AMBULATORY_CARE_PROVIDER_SITE_OTHER): Payer: Medicare Other | Admitting: *Deleted

## 2015-09-30 DIAGNOSIS — J309 Allergic rhinitis, unspecified: Secondary | ICD-10-CM

## 2015-10-07 ENCOUNTER — Ambulatory Visit (INDEPENDENT_AMBULATORY_CARE_PROVIDER_SITE_OTHER): Payer: Medicare Other | Admitting: *Deleted

## 2015-10-07 DIAGNOSIS — J309 Allergic rhinitis, unspecified: Secondary | ICD-10-CM | POA: Diagnosis not present

## 2015-10-09 ENCOUNTER — Other Ambulatory Visit: Payer: Self-pay | Admitting: Family Medicine

## 2015-10-14 ENCOUNTER — Ambulatory Visit (INDEPENDENT_AMBULATORY_CARE_PROVIDER_SITE_OTHER): Payer: Medicare Other | Admitting: *Deleted

## 2015-10-14 ENCOUNTER — Telehealth: Payer: Self-pay | Admitting: Internal Medicine

## 2015-10-14 DIAGNOSIS — J309 Allergic rhinitis, unspecified: Secondary | ICD-10-CM | POA: Diagnosis not present

## 2015-10-14 NOTE — Telephone Encounter (Signed)
We did not have any dulera 200 mcg at this time. Pt is aware. Nothing further needed

## 2015-10-20 ENCOUNTER — Encounter: Payer: Self-pay | Admitting: Internal Medicine

## 2015-10-21 ENCOUNTER — Ambulatory Visit (INDEPENDENT_AMBULATORY_CARE_PROVIDER_SITE_OTHER): Payer: Medicare Other | Admitting: *Deleted

## 2015-10-21 ENCOUNTER — Ambulatory Visit (INDEPENDENT_AMBULATORY_CARE_PROVIDER_SITE_OTHER): Payer: Medicare Other

## 2015-10-21 DIAGNOSIS — Z23 Encounter for immunization: Secondary | ICD-10-CM | POA: Diagnosis not present

## 2015-10-21 DIAGNOSIS — J309 Allergic rhinitis, unspecified: Secondary | ICD-10-CM | POA: Diagnosis not present

## 2015-10-21 MED ORDER — MOMETASONE FURO-FORMOTEROL FUM 200-5 MCG/ACT IN AERO
2.0000 | INHALATION_SPRAY | Freq: Two times a day (BID) | RESPIRATORY_TRACT | 12 refills | Status: DC
Start: 2015-10-21 — End: 2015-10-23

## 2015-10-22 ENCOUNTER — Encounter: Payer: Self-pay | Admitting: Internal Medicine

## 2015-10-22 NOTE — Telephone Encounter (Signed)
Spoke with pt on the phone. Advised her that we do not have samples at this time. Also advised her to contact her insurance company and find out which inhalers have a cheaper copay. She agreed and was appreciative of the call. She will contact us back with what she finds out. Nothing further was needed at this time.

## 2015-10-23 ENCOUNTER — Encounter: Payer: Self-pay | Admitting: Family Medicine

## 2015-10-23 ENCOUNTER — Encounter: Payer: Self-pay | Admitting: *Deleted

## 2015-10-23 ENCOUNTER — Telehealth: Payer: Self-pay | Admitting: Internal Medicine

## 2015-10-23 ENCOUNTER — Encounter: Payer: Self-pay | Admitting: Internal Medicine

## 2015-10-23 MED ORDER — MOMETASONE FURO-FORMOTEROL FUM 200-5 MCG/ACT IN AERO: 2.0000 | INHALATION_SPRAY | Freq: Two times a day (BID) | RESPIRATORY_TRACT | 0 refills | Status: DC

## 2015-10-23 NOTE — Telephone Encounter (Signed)
Spoke with pt about the MyChart message she sent to us earlier. She does not know if she will be able to afford $100 for a 90 day supply of Dulera. We have samples that I am going to leave at the front desk for pick up. Before she runs out of samples she will call us and let us know what she has decided to do about the prescription. Nothing further was needed.

## 2015-10-23 NOTE — Telephone Encounter (Signed)
lmtcb x1 for pt. 

## 2015-10-28 ENCOUNTER — Ambulatory Visit (INDEPENDENT_AMBULATORY_CARE_PROVIDER_SITE_OTHER): Payer: Medicare Other | Admitting: *Deleted

## 2015-10-28 DIAGNOSIS — J309 Allergic rhinitis, unspecified: Secondary | ICD-10-CM

## 2015-11-04 ENCOUNTER — Ambulatory Visit (INDEPENDENT_AMBULATORY_CARE_PROVIDER_SITE_OTHER): Payer: Medicare Other | Admitting: *Deleted

## 2015-11-04 DIAGNOSIS — J309 Allergic rhinitis, unspecified: Secondary | ICD-10-CM

## 2015-11-12 ENCOUNTER — Ambulatory Visit (INDEPENDENT_AMBULATORY_CARE_PROVIDER_SITE_OTHER): Payer: Medicare Other | Admitting: *Deleted

## 2015-11-12 DIAGNOSIS — J309 Allergic rhinitis, unspecified: Secondary | ICD-10-CM | POA: Diagnosis not present

## 2015-11-18 ENCOUNTER — Encounter: Payer: Self-pay | Admitting: Internal Medicine

## 2015-11-19 ENCOUNTER — Ambulatory Visit: Payer: Medicare Other

## 2015-11-20 ENCOUNTER — Ambulatory Visit (INDEPENDENT_AMBULATORY_CARE_PROVIDER_SITE_OTHER): Payer: Medicare Other | Admitting: *Deleted

## 2015-11-20 DIAGNOSIS — J309 Allergic rhinitis, unspecified: Secondary | ICD-10-CM

## 2015-11-27 ENCOUNTER — Ambulatory Visit (INDEPENDENT_AMBULATORY_CARE_PROVIDER_SITE_OTHER): Payer: Medicare Other | Admitting: *Deleted

## 2015-11-27 DIAGNOSIS — J309 Allergic rhinitis, unspecified: Secondary | ICD-10-CM | POA: Diagnosis not present

## 2015-11-29 ENCOUNTER — Encounter: Payer: Self-pay | Admitting: Internal Medicine

## 2015-12-01 NOTE — Telephone Encounter (Signed)
Ok to continue allergy vaccine- Katie please let Tammy know to continue.  I don't have anything stronger than Dulera in it's class. We can try substituting Bevespi samples # 2,,  Inhale 2 puffs, twice daily. See if that works better.

## 2015-12-01 NOTE — Telephone Encounter (Signed)
Dr Maple HudsonYoung,  Please advise on allergy injections for patient-continue and have Tammy Scott mix vaccine or wait and see how she does; pt has a lot of Ragweed around her home.    Also, pt would like to know if she should continue Dulera or change to something different-pt can tell it is not helping like it use to.   Thanks.

## 2015-12-04 ENCOUNTER — Ambulatory Visit (INDEPENDENT_AMBULATORY_CARE_PROVIDER_SITE_OTHER): Payer: Medicare Other | Admitting: *Deleted

## 2015-12-04 ENCOUNTER — Other Ambulatory Visit: Payer: Self-pay | Admitting: Internal Medicine

## 2015-12-04 DIAGNOSIS — J309 Allergic rhinitis, unspecified: Secondary | ICD-10-CM | POA: Diagnosis not present

## 2015-12-04 MED ORDER — GLYCOPYRROLATE-FORMOTEROL 9-4.8 MCG/ACT IN AERO: 2.0000 | INHALATION_SPRAY | Freq: Two times a day (BID) | RESPIRATORY_TRACT | 0 refills | Status: DC

## 2015-12-04 NOTE — Telephone Encounter (Signed)
Pt hear to pick up sample and waiting in lobby.Caren GriffinsStanley A Dalton

## 2015-12-10 ENCOUNTER — Other Ambulatory Visit: Payer: Self-pay | Admitting: Family Medicine

## 2015-12-11 ENCOUNTER — Ambulatory Visit (INDEPENDENT_AMBULATORY_CARE_PROVIDER_SITE_OTHER): Payer: Medicare Other | Admitting: *Deleted

## 2015-12-11 DIAGNOSIS — J309 Allergic rhinitis, unspecified: Secondary | ICD-10-CM | POA: Diagnosis not present

## 2015-12-17 ENCOUNTER — Other Ambulatory Visit: Payer: Self-pay | Admitting: Internal Medicine

## 2015-12-17 ENCOUNTER — Telehealth: Payer: Self-pay | Admitting: Internal Medicine

## 2015-12-17 DIAGNOSIS — J309 Allergic rhinitis, unspecified: Secondary | ICD-10-CM | POA: Diagnosis not present

## 2015-12-17 MED ORDER — MOMETASONE FURO-FORMOTEROL FUM 200-5 MCG/ACT IN AERO: 2.0000 | INHALATION_SPRAY | Freq: Two times a day (BID) | RESPIRATORY_TRACT | 3 refills | Status: DC

## 2015-12-17 NOTE — Telephone Encounter (Signed)
Rx refill completed

## 2015-12-17 NOTE — Telephone Encounter (Signed)
Allergy Serum Extract Date Mixed: 12/17/15 Vial: 2 Strength: 1:10 Here/Mail/Pick Up: here Mixed By: tbs Last OV: 07/16/15 Pending OV: 02/04/16

## 2015-12-18 ENCOUNTER — Ambulatory Visit (INDEPENDENT_AMBULATORY_CARE_PROVIDER_SITE_OTHER): Payer: Medicare Other | Admitting: *Deleted

## 2015-12-18 ENCOUNTER — Encounter: Payer: Self-pay | Admitting: Internal Medicine

## 2015-12-18 ENCOUNTER — Telehealth: Payer: Self-pay | Admitting: Internal Medicine

## 2015-12-18 DIAGNOSIS — J309 Allergic rhinitis, unspecified: Secondary | ICD-10-CM | POA: Diagnosis not present

## 2015-12-18 NOTE — Telephone Encounter (Signed)
Spoke with Florentina Addisonkatie and she stated that she will take care of this in the morning.  I called and lmom to make the pt aware.  Will forward to [facility] to follow up on in the am.

## 2015-12-19 NOTE — Telephone Encounter (Signed)
lmtcb x1 for pt. Jordan Patterson please advise if this has been taken care of.

## 2015-12-19 NOTE — Telephone Encounter (Signed)
mometasone-formoterol (DULERA) 200-5 MCG/ACT AERO [[id number]][[id number]]  Order Details  Dose: 2 puff Route: Inhalation Frequency: 2 times daily  Dispense Quantity:  3 Inhaler Refills:  3 Fills remaining:  --        Sig: Inhale 2 puffs into the lungs 2 (two) times daily.       Written Date:  12/17/15 Expiration Date:  12/16/16    Start Date:  12/17/15 End Date:  --         Ordering Provider:  Waymon Budgelinton D Young, MD DEA #:  [id number] NPI:  [id number]   Authorizing Provider:  Waymon Budgelinton D Young, MD DEA #:  [id number] NPI:  [id number]   Ordering User:  Ronny BaconKatie C Welchel, CMA              Order Questions   Question Answer Comment  Lot Number? [id number]   Expiration Date? 07/24/2016   Manufacturer?    NDC    Quantity 2       Original Order:  mometasone-formoterol [facility](DULERA) 200-5 MCG/ACT AERO [[id number]]    [facility] [place] [id number] DEA #:  --    Pharmacy Comments:  --       Fill quantity remaining:  -- Fill quantity used:  --         Order Class   Normal   Pt is aware that [facility]; she states she will contact them as she has not set up the account with them. Pt is aware to contact me directly if there are any other issues with Rx.

## 2015-12-19 NOTE — Telephone Encounter (Signed)
Patient is calling back stating she is really needing this medication and would like to speak to someone.  CB is [phone] or [phone]

## 2015-12-25 ENCOUNTER — Ambulatory Visit (INDEPENDENT_AMBULATORY_CARE_PROVIDER_SITE_OTHER): Payer: Medicare Other | Admitting: *Deleted

## 2015-12-25 DIAGNOSIS — J309 Allergic rhinitis, unspecified: Secondary | ICD-10-CM

## 2015-12-29 ENCOUNTER — Encounter: Payer: Self-pay | Admitting: Internal Medicine

## 2015-12-29 ENCOUNTER — Ambulatory Visit (INDEPENDENT_AMBULATORY_CARE_PROVIDER_SITE_OTHER): Payer: Medicare Other | Admitting: Internal Medicine

## 2015-12-29 VITALS — BP 138/70 | HR 76 | Ht 68.0 in | Wt 182.6 lb

## 2015-12-29 DIAGNOSIS — J45909 Unspecified asthma, uncomplicated: Secondary | ICD-10-CM

## 2015-12-29 DIAGNOSIS — J4541 Moderate persistent asthma with (acute) exacerbation: Secondary | ICD-10-CM

## 2015-12-29 LAB — NITRIC OXIDE: Nitric Oxide: 37

## 2015-12-29 NOTE — Progress Notes (Signed)
HPI  F never smoker followed for allergic rhinitis, asthma, complicated by GERD, hypothyroid, DM 2 Allergy vaccine 1:10 GH   03/12/2015-75 year old female never smoker followed for allergic rhinitis, asthma, complicated by GERD, hypothyroid, DM 2 Allergy Vaccine 1:10 GH every 2 weeks Follow For: Pt c/o increased wheezing, coughing and SOB. Coughing up a small amount of white phlegm first thing in the morning. Tolerating allergy vaccines well.  As Nasacort nasal spray if needed. Uses albuterol rescue inhaler about once daily, most days.  07/16/2015-75 year old female never smoker followed for allergic rhinitis, asthma, complicated by GERD, hypothyroid, DM 2 Allergy Vaccine 1:10 GH  FOLLOW FOR: allergies; patient is coming in weekly now, allergies have not been doing well with the weather change.  cough, thick clear mucus after using neb  More cough in the spring with scant white sputum and no infection. She went back to getting allergy shots every week instead of every other week. Using nebulizer about once daily. Little wheeze. Little upper airway discomfort. Cough is not worse at night or with eating but is increased by bending over. Only occasional use of rescue inhaler.  12/29/2015-75 year old female never smoker followed for Allergic rhinitis, Asthma, complicated by GERD, hypothyroid, DM 2 Allergy Vaccine 1:10 GH FOLLOWS FOR: Pt still on allergy vaccine and denies any concerns. Pt is having wheezing, SOB at times, coiugh-non productive and slight chest congestion. [facility] SleightDulera works well but is expensive so she is getting it by mail order. Fewer nasal symptoms now but more wheeze and cough with recent weather change. She believes she needs her allergy shots weekly, not every other week, during pollen season. CXR 07/16/2015 IMPRESSION: Hyperinflation consistent with reactive airway disease, stable. There is no pneumonia nor other acute cardiopulmonary abnormality. FENO 12/29/15- 37-elevated,  indicating allergic component to airway irritation. Office Spirometry 12/29/2015-moderately severe obstructive airways disease-FVC 1.99/60%, FEV1 1.29/51%, ratio 0.65, FEF 25-75 0.73/39%.  ROS-see HPI Constitutional:   No-   weight loss, night sweats, fevers, chills, fatigue, lassitude. HEENT:   No-  headaches, difficulty swallowing, tooth/dental problems, sore throat,       +sneezing, itching, ear ache, nasal congestion, post nasal drip,  CV:  No-   chest pain, orthopnea, PND, swelling in lower extremities, anasarca,                                                          dizziness, palpitations Resp: No-   shortness of breath with exertion or at rest.              No-   productive cough,  + non-productive cough,  No- coughing up of blood.              No-   change in color of mucus.+ wheezing.   Skin: No-   rash or lesions. GI:  +  heartburn, indigestion, No-abdominal pain, nausea, vomiting, GU:  MS:  No-   joint pain or swelling.   Neuro-     nothing unusual Psych:  No- change in mood or affect. No depression or anxiety.  No memory loss.  OBJ- Physical Exam General- Alert, Oriented, Affect-appropriate, Distress- none acute Skin- rash-none, lesions- none, excoriation- none Lymphadenopathy- none Head- atraumatic            Eyes- Gross vision intact, PERRLA, conjunctivae and secretions clear  Ears- clear            Nose- Clear, no-Septal dev, mucus, polyps, erosion, perforation             Throat- Mallampati II , mucosa clear , drainage+white, tonsils- atrophic Neck- flexible , trachea midline, no stridor , thyroid nl, carotid no bruit Chest - symmetrical excursion , unlabored           Heart/CV- RRR , no murmur , no gallop  , no rub, nl s1 s2                           - JVD- none , edema- none, stasis changes- none, varices- none           Lung- clear to P&A, wheeze- none, cough -none , dullness-none, rub- none           Chest wall- + back brace Abd-  Br/ Gen/ Rectal-  Not done, not indicated Extrem- cyanosis- none, clubbing, none, atrophy- none, strength- nl Neuro- grossly intact to observation

## 2015-12-29 NOTE — Patient Instructions (Addendum)
Order- FENO   Dx allergic asthma  Order- Office spirometry  As discussed, we will be ending the allergy vaccine program at this office late this winter. If you find you do better on allergy vaccine  Then you may want to connect to another allergy office in the area.  We can still see you here for asthma help  Order- schedule PFT   Dx copd mixed type  Order- lab for alpha 1 antitrypsin assay

## 2015-12-30 ENCOUNTER — Encounter: Payer: Self-pay | Admitting: Internal Medicine

## 2015-12-30 ENCOUNTER — Telehealth: Payer: Self-pay | Admitting: Family Medicine

## 2015-12-30 NOTE — Assessment & Plan Note (Signed)
There has been some weather related exacerbation this week but scores on office spirometry are worse than expected for a nonsmoker. Plan-continue Singulair, rescue inhaler and nebulizer machine. Schedule PFT, lab for alpha-1 antitrypsin assay                      Then consider change to include a maintenance inhaler

## 2015-12-30 NOTE — Telephone Encounter (Signed)
CY - please advise. Thanks! 

## 2015-12-30 NOTE — Telephone Encounter (Signed)
Antihistamines like claritin, benadryl and etc can definitely cause dry eye.

## 2015-12-30 NOTE — Telephone Encounter (Signed)
[name]'s Prolia injection will be due after 02/10/2016 so I have electronically submitted her info for [employer] insurance verification and will notify you once I have a response. Thank you.

## 2015-12-30 NOTE — Telephone Encounter (Signed)
Ok. Thanks!

## 2016-01-01 ENCOUNTER — Ambulatory Visit (INDEPENDENT_AMBULATORY_CARE_PROVIDER_SITE_OTHER): Payer: Medicare Other | Admitting: *Deleted

## 2016-01-01 DIAGNOSIS — J309 Allergic rhinitis, unspecified: Secondary | ICD-10-CM | POA: Diagnosis not present

## 2016-01-06 ENCOUNTER — Encounter: Payer: Self-pay | Admitting: Family Medicine

## 2016-01-08 ENCOUNTER — Ambulatory Visit (INDEPENDENT_AMBULATORY_CARE_PROVIDER_SITE_OTHER): Payer: Medicare Other | Admitting: *Deleted

## 2016-01-08 DIAGNOSIS — J309 Allergic rhinitis, unspecified: Secondary | ICD-10-CM

## 2016-01-13 NOTE — Telephone Encounter (Signed)
I have rec'd insurance verification for Ms. Meiklejohn's Prolia injection.  W/out an OV she will have an estimated responsibility of a $50 co-pay; w/an OV she will have an estimated responsibility of $85 co-pay.  Please make pt aware this is an estimate and we will not know an exact amt until insurance(s) has/have paid.  If she cannot afford $50-$85 for her injection, please advise her to contact [employer] at [phone] and select option #1 to see if she qualifies for one of their assistance programs.  If she qualifies they will instruct her how to proceed.  If you have any questions, please let me know. Thank you.  Cc:  Karie GeorgesShannon Bridges for ordering purposes

## 2016-01-14 ENCOUNTER — Encounter: Payer: Self-pay | Admitting: *Deleted

## 2016-01-14 NOTE — Telephone Encounter (Signed)
Patient notified via Mychart.

## 2016-01-18 IMAGING — MR MR THORACIC SPINE W/O CM
4 of 5 series · 18 of 48 positions shown · non-contrast
Comparison: Radiographs dated June 2014

CLINICAL DATA: Back pain and leg weakness. Compression fracture T[REDACTED].

EXAM:
MRI THORACIC SPINE WITHOUT CONTRAST
TECHNIQUE: Multiplanar, multisequence MR imaging of the thoracic spine was
performed. No intravenous contrast was administered.

[Series 4: T2 · sagittal · 3.0mm · 0.50mm/px · 5 of 13 slices shown (1 of 2)]
[im 1/13]
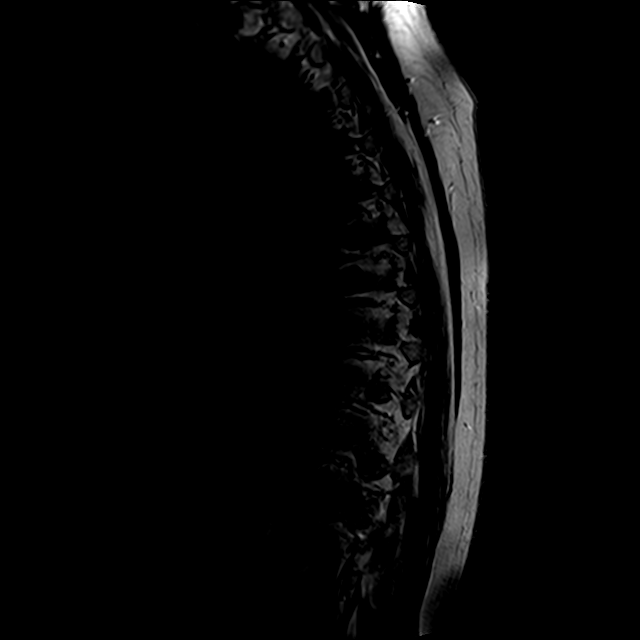
[im 4/13]
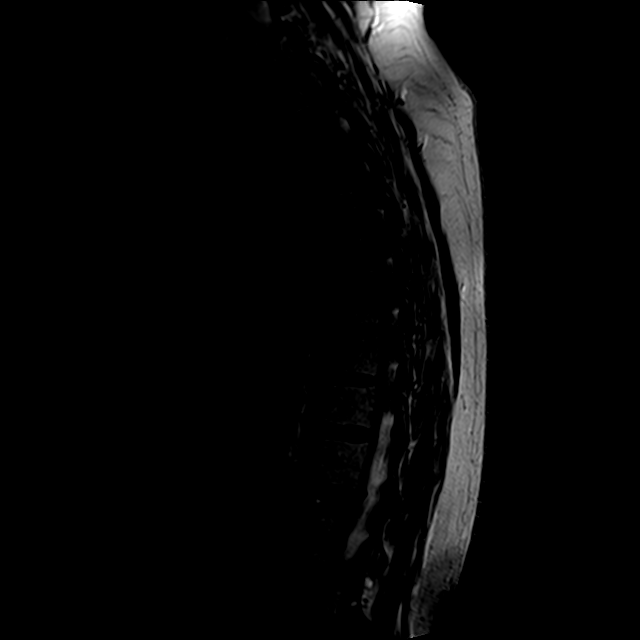
[im 7/13]
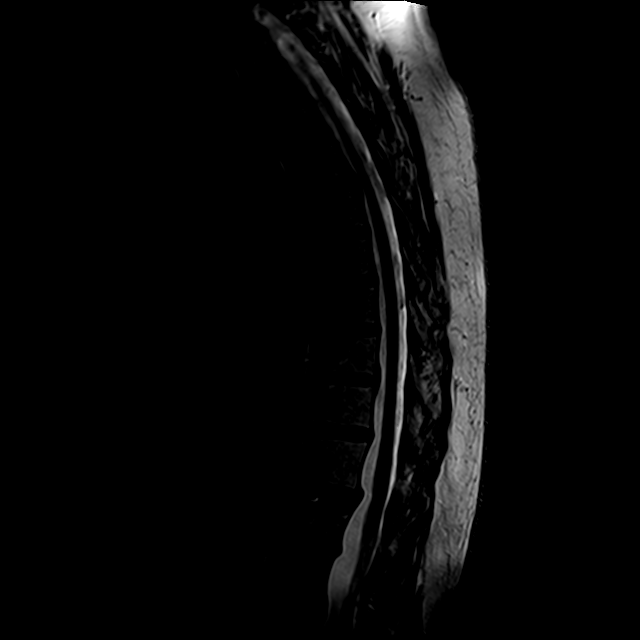
[im 10/13]
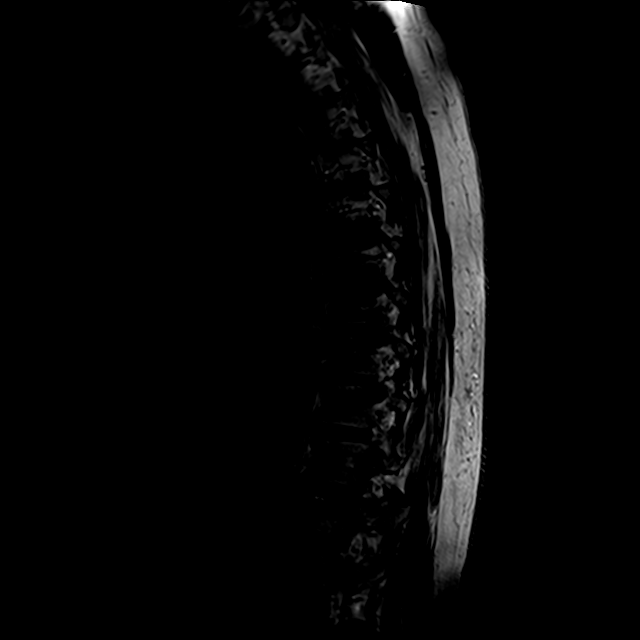
[im 13/13]
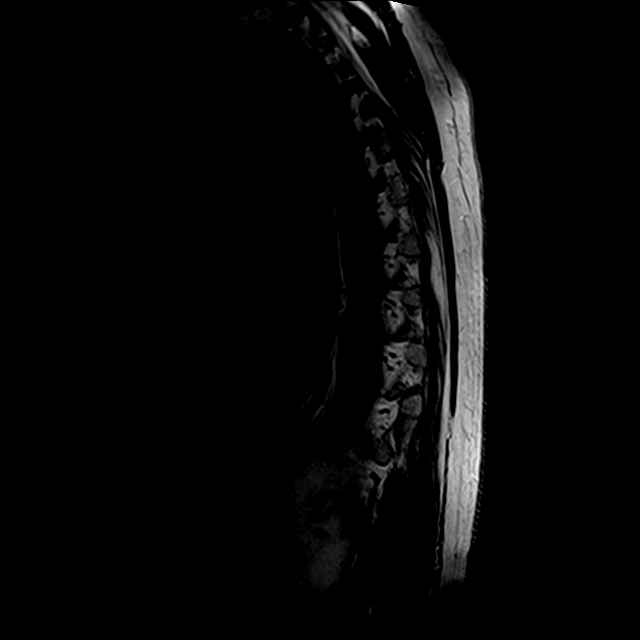

[Series 5: T1 · sagittal · 3.0mm · 0.50mm/px · 3 of 13 slices shown]
[im 1/13]
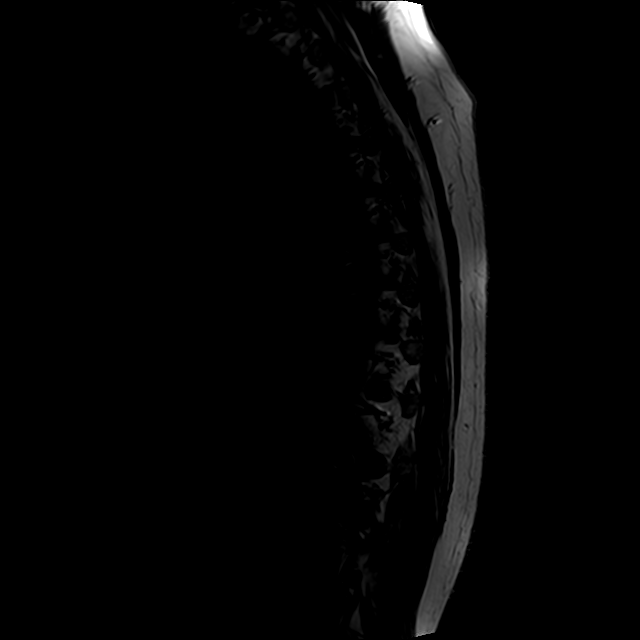
[im 7/13]
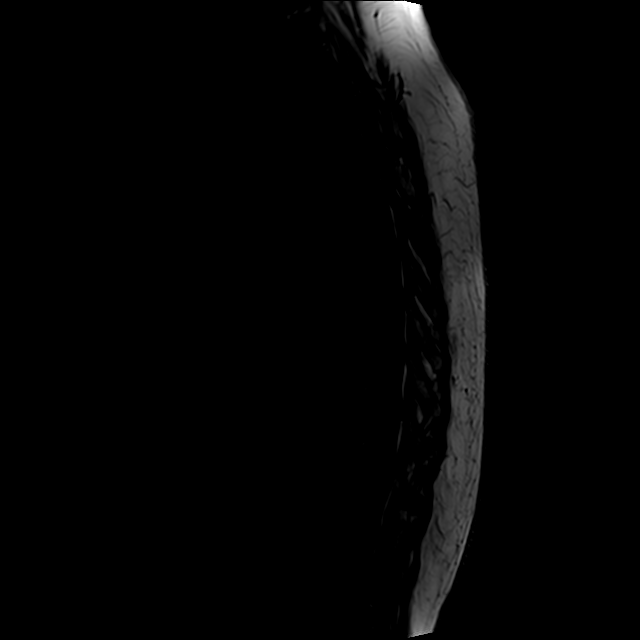
[im 13/13]
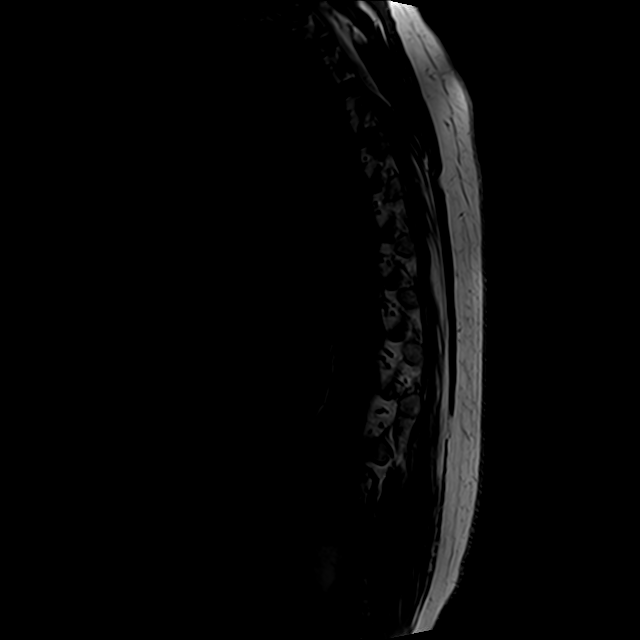

[Series 6: STIR · sagittal · 3.0mm · 1.00mm/px · 3 of 13 slices shown]
[im 3/13]
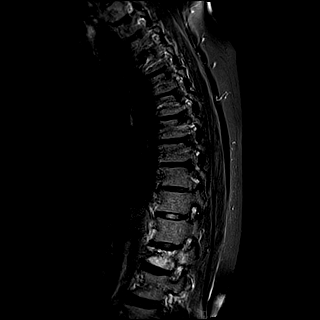
[im 8/13]
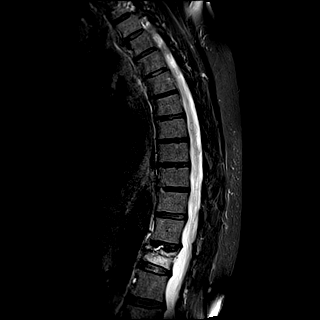
[im 13/13]
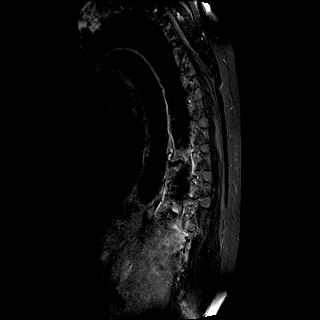

[Series 7: T2 · axial · 3.5mm · 0.39mm/px · z∈[-284,-113]mm · 7 of 37 slices shown (2 of 2)]
[im 3/37]
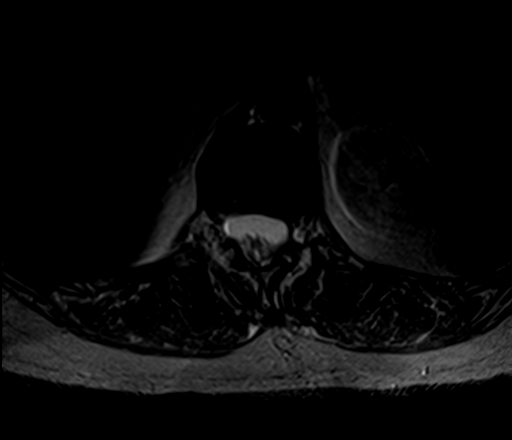
[im 5/37]
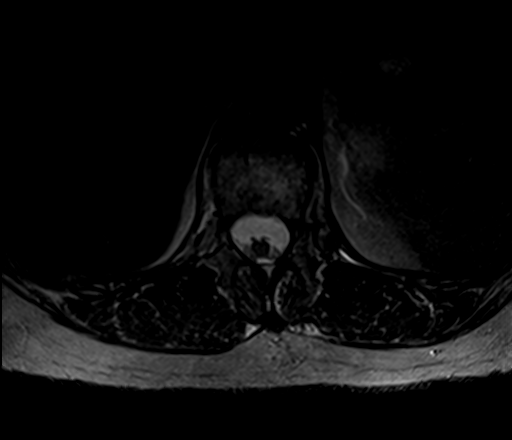
[im 8/37]
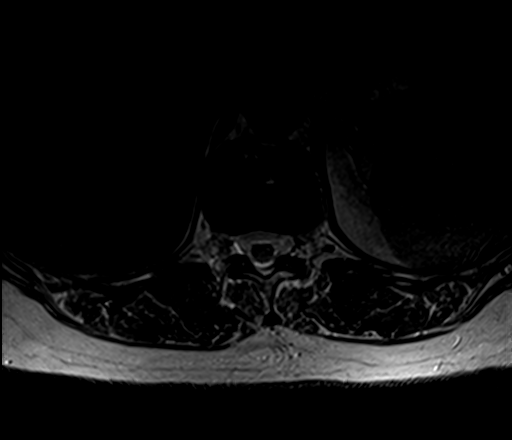
[im 13/37]
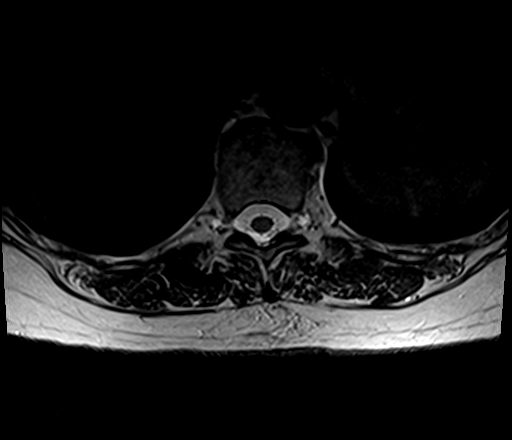
[im 17/37]
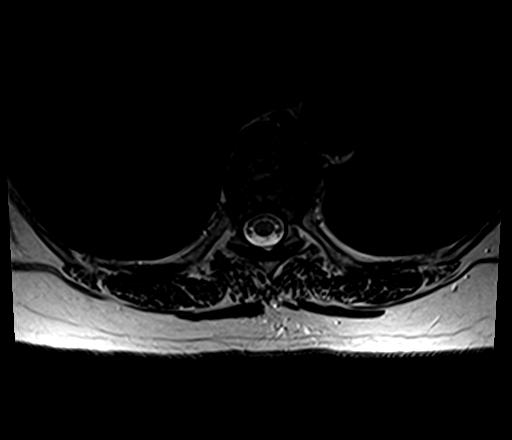
[im 20/37]
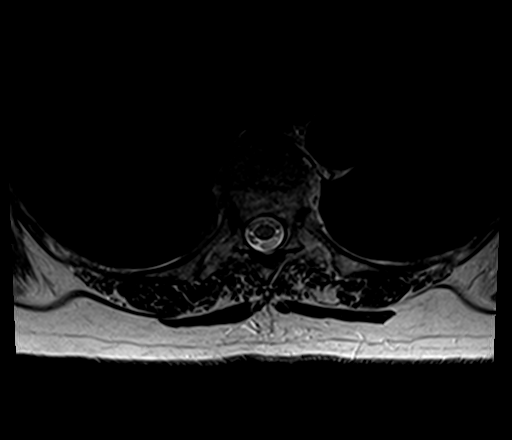
[im 32/37]
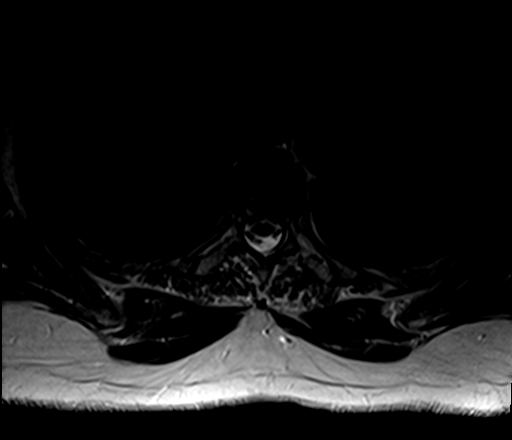

[18 of 48 positions shown; findings below may reference images not displayed]

FINDINGS: There is a subacute compression fracture of the superior aspect of
T12. There is only 2 mm of protrusion of the posterior superior
aspect of T12 into the spinal canal with no neural impingement.
There is no disc protrusion at that level.

There is a small central subligamentous disc protrusion at T5-6
which does deform the ventral aspect of the thoracic spinal cord but
there is no myelopathy.

There is a small asymmetric disc bulge at T9-10 slightly to the
right of midline with no neural impingement.

There is tiny broad-based bulge of the T12-L1 disc with no neural
impingement. Normal conus tip at L1.
IMPRESSION: 1. Benign appearing compression fracture of the superior aspect of
T12 with no neural impingement.
2. Small central disc protrusion at T5-6 slightly distorts the
ventral aspect of the spinal cord without myelopathy.

## 2016-01-20 NOTE — Telephone Encounter (Signed)
Pt sent a message stating the Prolia was not in her budget. Does she need to do anything for her osteoporosis since she is not doing the Prolia?

## 2016-01-22 ENCOUNTER — Ambulatory Visit: Payer: Medicare Other

## 2016-01-22 ENCOUNTER — Ambulatory Visit (INDEPENDENT_AMBULATORY_CARE_PROVIDER_SITE_OTHER): Payer: Medicare Other | Admitting: *Deleted

## 2016-01-22 DIAGNOSIS — J309 Allergic rhinitis, unspecified: Secondary | ICD-10-CM | POA: Diagnosis not present

## 2016-01-26 ENCOUNTER — Encounter: Payer: Self-pay | Admitting: Family Medicine

## 2016-01-27 NOTE — Telephone Encounter (Signed)
Message left for patient to return my call.  

## 2016-01-27 NOTE — Telephone Encounter (Signed)
plz verify with patient she cannot afford $50 copay - remind it's every 6 month injection or <$10/month (which may be comparable to bisphosphonate depending on dose as she has to buy that monthly).  Did she check with prolia at # below to see if she qualified for patient assistance? If still does not desire prolia, will start boniva IV infusion Q3 months.

## 2016-01-28 NOTE — Telephone Encounter (Signed)
Patient sent mychart message that she will discuss this at her appt. Leisa, please see below and see me if you have any questions. Thanks.

## 2016-02-04 ENCOUNTER — Ambulatory Visit: Payer: Medicare Other | Admitting: Internal Medicine

## 2016-02-05 ENCOUNTER — Ambulatory Visit (INDEPENDENT_AMBULATORY_CARE_PROVIDER_SITE_OTHER): Payer: Medicare Other | Admitting: *Deleted

## 2016-02-05 DIAGNOSIS — J309 Allergic rhinitis, unspecified: Secondary | ICD-10-CM | POA: Diagnosis not present

## 2016-02-07 ENCOUNTER — Other Ambulatory Visit: Payer: Self-pay | Admitting: Family Medicine

## 2016-02-07 ENCOUNTER — Other Ambulatory Visit: Payer: Self-pay | Admitting: Internal Medicine

## 2016-02-09 ENCOUNTER — Other Ambulatory Visit: Payer: Medicare Other

## 2016-02-09 ENCOUNTER — Ambulatory Visit (INDEPENDENT_AMBULATORY_CARE_PROVIDER_SITE_OTHER): Payer: Medicare Other

## 2016-02-09 VITALS — BP 150/100 | HR 77 | Temp 98.2°F | Ht 67.0 in | Wt 181.2 lb

## 2016-02-09 DIAGNOSIS — E038 Other specified hypothyroidism: Secondary | ICD-10-CM

## 2016-02-09 DIAGNOSIS — R7309 Other abnormal glucose: Secondary | ICD-10-CM | POA: Diagnosis not present

## 2016-02-09 DIAGNOSIS — Z130 Encounter for screening for diseases of the blood and blood-forming organs and certain disorders involving the immune mechanism: Secondary | ICD-10-CM

## 2016-02-09 DIAGNOSIS — Z0000 Encounter for general adult medical examination without abnormal findings: Secondary | ICD-10-CM

## 2016-02-09 DIAGNOSIS — E784 Other hyperlipidemia: Secondary | ICD-10-CM

## 2016-02-09 DIAGNOSIS — E7849 Other hyperlipidemia: Secondary | ICD-10-CM

## 2016-02-09 DIAGNOSIS — M810 Age-related osteoporosis without current pathological fracture: Secondary | ICD-10-CM | POA: Diagnosis not present

## 2016-02-09 LAB — HEMOGLOBIN A1C: Hgb A1c MFr Bld: 7.3 % — ABNORMAL HIGH (ref 4.6–6.5)

## 2016-02-09 LAB — CBC WITH DIFFERENTIAL/PLATELET
Basophils Absolute: 0 10*3/uL (ref 0.0–0.1)
Basophils Relative: 0.6 % (ref 0.0–3.0)
Eosinophils Absolute: 0.1 10*3/uL (ref 0.0–0.7)
Eosinophils Relative: 2.9 % (ref 0.0–5.0)
HCT: 39.6 % (ref 36.0–46.0)
Hemoglobin: 13.5 g/dL (ref 12.0–15.0)
Lymphocytes Relative: 38.3 % (ref 12.0–46.0)
Lymphs Abs: 1.5 10*3/uL (ref 0.7–4.0)
MCHC: 34 g/dL (ref 30.0–36.0)
MCV: 89.8 fl (ref 78.0–100.0)
Monocytes Absolute: 0.3 10*3/uL (ref 0.1–1.0)
Monocytes Relative: 7.3 % (ref 3.0–12.0)
Neutro Abs: 2 10*3/uL (ref 1.4–7.7)
Neutrophils Relative %: 50.9 % (ref 43.0–77.0)
Platelets: 184 10*3/uL (ref 150.0–400.0)
RBC: 4.41 Mil/uL (ref 3.87–5.11)
RDW: 13.4 % (ref 11.5–15.5)
WBC: 3.9 10*3/uL — ABNORMAL LOW (ref 4.0–10.5)

## 2016-02-09 LAB — LIPID PANEL
Cholesterol: 287 mg/dL — ABNORMAL HIGH (ref 0–200)
HDL: 45.9 mg/dL (ref >39.00)
LDL Cholesterol: 206 mg/dL — ABNORMAL HIGH (ref 0–99)
NonHDL: 241.53
Total CHOL/HDL Ratio: 6
Triglycerides: 179 mg/dL — ABNORMAL HIGH (ref 0.0–149.0)
VLDL: 35.8 mg/dL (ref 0.0–40.0)

## 2016-02-09 LAB — T4, FREE: Free T4: 1.25 ng/dL (ref 0.60–1.60)

## 2016-02-09 LAB — TSH: TSH: 2.37 u[IU]/mL (ref 0.35–4.50)

## 2016-02-09 LAB — VITAMIN D 25 HYDROXY (VIT D DEFICIENCY, FRACTURES): VITD: 49.58 ng/mL (ref 30.00–100.00)

## 2016-02-09 NOTE — Progress Notes (Signed)
I reviewed health advisor's note, was available for consultation, and agree with documentation and plan.  

## 2016-02-09 NOTE — Progress Notes (Signed)
PCP notes:   Health maintenance:  Shingles vaccine - pt wants to discuss necessity of vaccine with PCP due to hx of shingles A1C - completed  Abnormal screenings:   Hearing - failed  Patient concerns:   None  Nurse concerns:  BP was elevated. Pt stated she had taken BP medication 30 min prior to appt.   Next PCP appt:   02/13/16 @ 1130

## 2016-02-09 NOTE — Progress Notes (Signed)
Pre visit review using our clinic review tool, if applicable. No additional management support is needed unless otherwise documented below in the visit note. 

## 2016-02-09 NOTE — Patient Instructions (Signed)
[name] , Thank you for taking time to come for your Medicare Wellness Visit. I appreciate your ongoing commitment to your health goals. Please review the following plan we discussed and let me know if I can assist you in the future.   These are the goals we discussed: Goals    . Increase physical activity          Starting 02/09/2016, I will continue to walk at least 60 min daily as weather permits.        This is a list of the screening recommended for you and due dates:  Health Maintenance  Topic Date Due  . Shingles Vaccine  02/08/2017*  . DTaP/Tdap/Td vaccine (1 - Tdap) 01/13/2022*  . Eye exam for diabetics  04/22/2016  . Complete foot exam   08/06/2016  . Hemoglobin A1C  08/09/2016  . Cologuard (Stool DNA test)  04/29/2018  . Tetanus Vaccine  01/13/2022  . Flu Shot  Completed  . DEXA scan (bone density measurement)  Completed  . Pneumonia vaccines  Completed  *Topic was postponed. The date shown is not the original due date.   Preventive Care for Adults  A healthy lifestyle and preventive care can promote health and wellness. Preventive health guidelines for adults include the following key practices.  . A routine yearly physical is a good way to check with your health care provider about your health and preventive screening. It is a chance to share any concerns and updates on your health and to receive a thorough exam.  . Visit your dentist for a routine exam and preventive care every 6 months. Brush your teeth twice a day and floss once a day. Good oral hygiene prevents tooth decay and gum disease.  . The frequency of eye exams is based on your age, health, family medical history, use  of contact lenses, and other factors. Follow your health care provider's ecommendations for frequency of eye exams.  . Eat a healthy diet. Foods like vegetables, fruits, whole grains, low-fat dairy products, and lean protein foods contain the nutrients you need without too many  calories. Decrease your intake of foods high in solid fats, added sugars, and salt. Eat the right amount of calories for you. Get information about a proper diet from your health care provider, if necessary.  . Regular physical exercise is one of the most important things you can do for your health. Most adults should get at least 150 minutes of moderate-intensity exercise (any activity that increases your heart rate and causes you to sweat) each week. In addition, most adults need muscle-strengthening exercises on 2 or more days a week.  Silver Sneakers may be a benefit available to you. To determine eligibility, you may visit the website: www.silversneakers.com or contact program at [phone] Mon-Fri between 8AM-8PM.   . Maintain a healthy weight. The body mass index (BMI) is a screening tool to identify possible weight problems. It provides an estimate of body fat based on height and weight. Your health care provider can find your BMI and can help you achieve or maintain a healthy weight.   For adults 20 years and older: ? A BMI below 18.5 is considered underweight. ? A BMI of 18.5 to 24.9 is normal. ? A BMI of 25 to 29.9 is considered overweight. ? A BMI of 30 and above is considered obese.   . Maintain normal blood lipids and cholesterol levels by exercising and minimizing your intake of saturated fat. Eat a balanced diet  with plenty of fruit and vegetables. Blood tests for lipids and cholesterol should begin at age 48 and be repeated every 5 years. If your lipid or cholesterol levels are high, you are over 50, or you are at high risk for heart disease, you may need your cholesterol levels checked more frequently. Ongoing high lipid and cholesterol levels should be treated with medicines if diet and exercise are not working.  . If you smoke, find out from your health care provider how to quit. If you do not use tobacco, please do not start.  . If you choose to drink alcohol, please do  not consume more than 2 drinks per day. One drink is considered to be 12 ounces (355 mL) of beer, 5 ounces (148 mL) of wine, or 1.5 ounces (44 mL) of liquor.  . If you are 21-59 years old, ask your health care provider if you should take aspirin to prevent strokes.  . Use sunscreen. Apply sunscreen liberally and repeatedly throughout the day. You should seek shade when your shadow is shorter than you. Protect yourself by wearing long sleeves, pants, a wide-brimmed hat, and sunglasses year round, whenever you are outdoors.  . Once a month, do a whole body skin exam, using a mirror to look at the skin on your back. Tell your health care provider of new moles, moles that have irregular borders, moles that are larger than a pencil eraser, or moles that have changed in shape or color.

## 2016-02-09 NOTE — Progress Notes (Signed)
Subjective:   Jordan Patterson is a 75 y.o. female who presents for an subsequent Medicare Annual Wellness Visit.  Review of Systems    N/A  Cardiac Risk Factors include: advanced age (>5355men, 12>65 women);diabetes mellitus;dyslipidemia;hypertension     Objective:    Today's Vitals   02/09/16 0921 02/09/16 0925  BP: (!) 150/100   Pulse: 77   Temp: 98.2 F (36.8 C)   TempSrc: Oral   SpO2: 95%   Weight: 181 lb 4 oz (82.2 kg)   Height: 5\' 7"  (1.702 m)   PainSc: 3  3   PainLoc: Back    Body mass index is 28.39 kg/m.   Current Medications (verified) Outpatient Encounter Prescriptions as of 02/09/2016  Medication Sig  . albuterol (PROVENTIL) (2.5 MG/3ML) 0.083% nebulizer solution Take 3 mLs (2.5 mg total) by nebulization every 6 (six) hours as needed for wheezing or shortness of breath.  Marland Kitchen. albuterol (VENTOLIN HFA) 108 (90 BASE) MCG/ACT inhaler Inhale 2 puffs into the lungs every 6 (six) hours as needed for wheezing or shortness of breath.  Marland Kitchen. aspirin 81 MG tablet Take 81 mg by mouth daily.  . calcitonin, salmon, (MIACALCIN/FORTICAL) 200 UNIT/ACT nasal spray USE ONE (1) SPRAY IN ALTERNATING NOSTRILS DAILY  . Coenzyme Q10 (COQ10) 100 MG CAPS Take 100 mg by mouth daily.   . Garlic (GARLIQUE PO) Take 1 tablet by mouth daily.  . Glucosamine-Chondroitin (OSTEO BI-FLEX REGULAR STRENGTH PO) Take 2 tablets by mouth daily with supper.  Marland Kitchen. glucose blood test strip ONE TOUCH ULTRA MINI Use to check sugar daily. Dx:E11.9  . levocetirizine (XYZAL) 5 MG tablet TAKE ONE TABLET BY MOUTH EACH EVENING  . losartan (COZAAR) 50 MG tablet TAKE 1 TABLET BY MOUTH DAILY  . Melatonin 5 MG TABS Take 5 mg by mouth at bedtime.   . metFORMIN (GLUCOPHAGE) 500 MG tablet Take 1 tablet (500 mg total) by mouth 2 (two) times daily with a meal. (Patient taking differently: Take 500 mg by mouth daily. )  . mometasone-formoterol (DULERA) 200-5 MCG/ACT AERO Inhale 2 puffs into the lungs 2 (two) times daily.  . montelukast  (SINGULAIR) 10 MG tablet TAKE ONE TABLET BY MOUTH EVERY NIGHT AT BEDTIME  . Multiple Vitamin (MULTIVITAMIN WITH MINERALS) TABS tablet Take 1 tablet by mouth daily.  . Multiple Vitamins-Minerals (EYE VITAMINS) CAPS Take 1 capsule by mouth daily.  . NONFORMULARY OR COMPOUNDED ITEM Allergy Vaccine 1:50 Given at [facility]  . omeprazole (PRILOSEC) 40 MG capsule Take 1 capsule (40 mg total) by mouth 2 (two) times daily.  . Polyvinyl Alcohol-Povidone (REFRESH OP) Place 1 drop into both eyes daily.   . sodium chloride (BRONCHO SALINE) inhaler solution Take 1 spray by nebulization as needed.  . triamterene-hydrochlorothiazide (DYAZIDE) 37.5-25 MG capsule TAKE ONE CAPSULE BY MOUTH EVERY MORNING  . Vitamin D, Cholecalciferol, 400 UNITS CAPS Take 1 capsule by mouth daily.  . [DISCONTINUED] losartan (COZAAR) 50 MG tablet TAKE 1 TABLET BY MOUTH DAILY  . [DISCONTINUED] SYNTHROID 100 MCG tablet TAKE 1 TABLET BY MOUTH DAILY BEFORE BREAKFAST   No facility-administered encounter medications on file as of 02/09/2016.     Allergies (verified) Ace inhibitors; Amlodipine; Crestor [rosuvastatin]; Pregabalin; Tegretol [carbamazepine]; Wellbutrin [bupropion hcl]; and Cymbalta [duloxetine hcl]   History: Past Medical History:  Diagnosis Date  . Allergic rhinitis   . Arthritis    hands and knees - ?osteo  . Asthma   . Chronic headaches   . Compression fracture of T12 vertebra (HCC) 07/18/2014  S/p kyphoplasty by Dr Newell Coral (08/2014)   . Depression    pt denies  . Diabetes type 2, controlled (HCC) 2004  . Environmental allergies    dust,mold,mildew  . Extrinsic asthma   . GERD (gastroesophageal reflux disease)    [name] EGD - mild esophageal dysmotility and small hiatal hernia  . Hiatal hernia   . Hx of migraines   . Hyperlipidemia    mild, diet controlled  . Hypertension   . Hypothyroidism   . Macular degeneration   . Osteoporosis with fracture 07/2014   T -1.2 hip, T 0.2 spine, T12  compression fracture s/p kyphoplasty   Past Surgical History:  Procedure Laterality Date  . CATARACT EXTRACTION  2003   bilateral with lens implant  . COLONOSCOPY    . DEXA  05/2008   improved (initial DEXA 2007 - mild osteopenia)  . DILATION AND CURETTAGE OF UTERUS  1978  . ESOPHAGOGASTRODUODENOSCOPY  06/25/2011   Procedure: ESOPHAGOGASTRODUODENOSCOPY (EGD);  Surgeon: Hart Carwin, MD;  Location: [facility];  Service: Endoscopy;  Laterality: N/A;  no Xray  . KYPHOPLASTY N/A 08/30/2014   Procedure: THORACIC TWELVE KYPHOPLASTY;  Surgeon: [name], MD  . LAPAROSCOPY ABDOMEN DIAGNOSTIC     To R/O endometriosis  . SAVORY DILATION  06/25/2011   Procedure: SAVORY DILATION;  Surgeon: Hart Carwin, MD;  Location: [facility];  Service: Endoscopy;  Laterality: N/A;  . TONSILLECTOMY AND ADENOIDECTOMY  1962  . US ECHOCARDIOGRAPHY  10/2014   nl systolic function EF 55%, mild diastolic dysfunction   Family History  Problem Relation Age of Onset  . Mitral valve prolapse Father   . Diabetes Father   . Mitral valve prolapse Mother   . Hypertension Mother   . Stroke Mother 46    after valve surgery  . Parkinson's disease Brother   . Other Brother     POST WAR TRAUMA  . Stroke Paternal Grandfather   . Heart failure Maternal Grandfather   . Cancer Neg Hx    Social History   Occupational History  . Retired    Social History Main Topics  . Smoking status: Never Smoker  . Smokeless tobacco: Never Used  . Alcohol use No  . Drug use: No  . Sexual activity: No    Tobacco Counseling Counseling given: No   Activities of Daily Living In your present state of health, do you have any difficulty performing the following activities: 02/09/2016  Hearing? Y  Vision? N  Difficulty concentrating or making decisions? N  Walking or climbing stairs? Y  Dressing or bathing? N  Doing errands, shopping? N  Preparing Food and eating ? N  Using the Toilet? N  In the past six months, have you  accidently leaked urine? Y  Do you have problems with loss of bowel control? N  Managing your Medications? N  Managing your Finances? N  Housekeeping or managing your Housekeeping? N  Some recent data might be hidden    Immunizations and Health Maintenance Immunization History  Administered Date(s) Administered  . Influenza Whole 11/23/2011, 12/06/2012  . Influenza,inj,Quad PF,36+ Mos 11/06/2014, 10/21/2015  . Influenza-Unspecified 11/22/2013  . Pneumococcal Conjugate-13 05/29/2015  . Pneumococcal Polysaccharide-23 10/23/2005  . Td 01/14/2012   There are no preventive care reminders to display for this patient.  Patient Care Team: Eustaquio Boyden, MD as PCP - General (Family Medicine) Eber Jones, MD as Referring Physician (Ophthalmology) Waymon Budge, MD as Consulting Physician (Pulmonary Disease) Nicki Reaper, DC as  Consulting Physician (Chiropractic Medicine) Richrd Humbleslay Burton, DDS as Consulting Physician (Dentistry)     Assessment:   This is a routine wellness examination for Jordan Patterson.   Hearing/Vision screen  Hearing Screening   125Hz  250Hz  500Hz  1000Hz  2000Hz  3000Hz  4000Hz  6000Hz  8000Hz   Right ear:   40 0 0  0    Left ear:   40 0 40  0    Vision Screening Comments: Last vision exam on January 27, 2016 with Dr[facility] Sher[facility]ll [facility]Shah [facility]  Dietary issues and exercise activities discussed: Current Exercise Habits: Home exercise routine, Type of exercise: walking, Time (Minutes): 60, Frequency (Times/Week): 7, Weekly Exercise (Minutes/Week): 420, Intensity: Moderate, Exercise limited by: None identified  Goals    . Increase physical activity          Starting 02/09/2016, I will continue to walk at least 60 min daily as weather permits.       Depression Screen PHQ 2/9 Scores 02/09/2016 02/07/2015 01/24/2014 01/22/2013 01/14/2012  PHQ - 2 Score 0 0 0 0 0    Fall Risk Fall Risk  02/09/2016 02/07/2015 01/24/2014 01/22/2013 01/14/2012  Falls in the past year? No Yes No No  No  Number falls in past yr: - 1 - - -  Injury with Fall? - Yes - - -    Cognitive Function: MMSE - Mini Mental State Exam 02/09/2016  Orientation to time 5  Orientation to Place 5  Registration 3  Attention/ Calculation 0  Recall 3  Language- name 2 objects 0  Language- repeat 1  Language- follow 3 step command 3  Language- read & follow direction 0  Write a sentence 0  Copy design 0  Total score 20     PLEASE NOTE: A Mini-Cog screen was completed. Maximum score is 20. A value of 0 denotes this part of Folstein MMSE was not completed or the patient failed this part of the Mini-Cog screening.   Mini-Cog Screening Orientation to Time - Max 5 pts Orientation to Place - Max 5 pts Registration - Max 3 pts Recall - Max 3 pts Language Repeat - Max 1 pts Language Follow 3 Step Command - Max 3 pts     Screening Tests Health Maintenance  Topic Date Due  . ZOSTAVAX  02/08/2017 (Originally 10/14/2000)  . DTaP/Tdap/Td (1 - Tdap) 01/13/2022 (Originally 01/15/2012)  . OPHTHALMOLOGY EXAM  04/22/2016  . FOOT EXAM  08/06/2016  . HEMOGLOBIN A1C  08/09/2016  . Fecal DNA (Cologuard)  04/29/2018  . TETANUS/TDAP  01/13/2022  . INFLUENZA VACCINE  Completed  . DEXA SCAN  Completed  . PNA vac Low Risk Adult  Completed      Plan:     I have personally reviewed and addressed the Medicare Annual Wellness questionnaire and have noted the following in the patient's chart:  A. Medical and social history B. Use of alcohol, tobacco or illicit drugs  C. Current medications and supplements D. Functional ability and status E.  Nutritional status F.  Physical activity G. Advance directives H. List of other physicians I.  Hospitalizations, surgeries, and ER visits in previous 12 months J.  Vitals K. Screenings to include hearing, vision, cognitive, depression L. Referrals and appointments - none  In addition, I have reviewed and discussed with patient certain preventive protocols, quality  metrics, and best practice recommendations. A written personalized care plan for preventive services as well as general preventive health recommendations were provided to patient.  See attached scanned questionnaire for additional information.  Signed,   Lindell Noe, MHA, BS, LPN Health Coach

## 2016-02-12 ENCOUNTER — Telehealth: Payer: Self-pay | Admitting: Internal Medicine

## 2016-02-12 NOTE — Telephone Encounter (Signed)
Spoke with Eunice Blaseebbie at [facility]. She has been given the info needed for pt's allergy shots. Nothing further was needed.

## 2016-02-13 ENCOUNTER — Ambulatory Visit (INDEPENDENT_AMBULATORY_CARE_PROVIDER_SITE_OTHER): Payer: Medicare Other | Admitting: Family Medicine

## 2016-02-13 ENCOUNTER — Encounter: Payer: Self-pay | Admitting: Family Medicine

## 2016-02-13 VITALS — BP 150/90 | HR 83 | Wt 181.0 lb

## 2016-02-13 DIAGNOSIS — Z0000 Encounter for general adult medical examination without abnormal findings: Secondary | ICD-10-CM | POA: Diagnosis not present

## 2016-02-13 DIAGNOSIS — E785 Hyperlipidemia, unspecified: Secondary | ICD-10-CM

## 2016-02-13 DIAGNOSIS — E119 Type 2 diabetes mellitus without complications: Secondary | ICD-10-CM

## 2016-02-13 DIAGNOSIS — E039 Hypothyroidism, unspecified: Secondary | ICD-10-CM

## 2016-02-13 DIAGNOSIS — K219 Gastro-esophageal reflux disease without esophagitis: Secondary | ICD-10-CM

## 2016-02-13 DIAGNOSIS — I10 Essential (primary) hypertension: Secondary | ICD-10-CM

## 2016-02-13 DIAGNOSIS — Z7189 Other specified counseling: Secondary | ICD-10-CM

## 2016-02-13 DIAGNOSIS — M8000XD Age-related osteoporosis with current pathological fracture, unspecified site, subsequent encounter for fracture with routine healing: Secondary | ICD-10-CM

## 2016-02-13 DIAGNOSIS — J4540 Moderate persistent asthma, uncomplicated: Secondary | ICD-10-CM

## 2016-02-13 MED ORDER — METFORMIN HCL 500 MG PO TABS: 500.0000 mg | ORAL_TABLET | Freq: Two times a day (BID) | ORAL | 3 refills | Status: DC

## 2016-02-13 MED ORDER — LOSARTAN POTASSIUM 50 MG PO TABS: 75.0000 mg | ORAL_TABLET | Freq: Every day | ORAL | 11 refills | Status: DC

## 2016-02-13 MED ORDER — SYNTHROID 100 MCG PO TABS: 100.0000 ug | ORAL_TABLET | Freq: Every day | ORAL | 11 refills | Status: DC

## 2016-02-13 MED ORDER — CALCITONIN (SALMON) 200 UNIT/ACT NA SOLN: NASAL | 11 refills | Status: DC

## 2016-02-13 MED ORDER — OMEPRAZOLE 40 MG PO CPDR: 40.0000 mg | DELAYED_RELEASE_CAPSULE | Freq: Two times a day (BID) | ORAL | 11 refills | Status: DC

## 2016-02-13 MED ORDER — TRIAMTERENE-HCTZ 37.5-25 MG PO CAPS: 1.0000 | ORAL_CAPSULE | Freq: Every morning | ORAL | 11 refills | Status: DC

## 2016-02-13 NOTE — Progress Notes (Signed)
BP (!) 150/90 (BP Location: Right Arm, Cuff Size: Normal)   Pulse 83   Wt 181 lb (82.1 kg)   SpO2 92%   BMI 28.35 kg/m    CC: CPE Subjective:    Patient ID: Jordan Patterson, female    DOB: 03/24/1940, 75 y.o.   MRN: [id number]  HPI: Jordan HorsemanJean C Gossett is a 75 y.o. female presenting on 02/13/2016 for Annual Exam   Saw Virl AxeLesia Monday for medicare wellness visit, note reviewed.   Osteoporosis with fracture - calcitonin x 1+ yr. Declined oral bisphosphonate given severe GERD/HH. Declined prolia and IV bisphosphonate. Desires to continue calcitonin.   GERD controlled with omeprazole 40mg  bid. Discussed trial QD dosing.   Preventative: Colonoscopy - 2007 WNL Dr. Annia Friend[facility].Cologuard WNL 03/2015.  Mammogram - 09/2014 Birads1. Requests Q2 yr mammograms. Normal breast exams at home. Declines breast exam today.  Pap - always normal pap smear/pelvic exams. Established with Dr Glenard HaringVernardo with [facility]. Dexa 2016 osteopenia. Fracture however has classified her as osteoporosis.  Flu shot done Pneumovax - 2007, prevnar 05/2015 Tetanus - 2013 Shingles shot - has had shingles. Declines.  Advanced directives: has living will at home. HCPOA is daughter Deeann Creeracey Cleary [place] [phone] (cell). In chart (02/2013) Seat belt use discussed Sunscreen use discussed. No changing moles on skin Non smoker Alcohol - none  Caffeine: occasional coffee  Lives alone, widower, 1 cat  Occupation: retired, Diplomatic Services operational officersecretary at [employer]  Edu: some college  Act: gardens, walks  Diet: good water, fruits/vegetables daily   Relevant past medical, surgical, family and social history reviewed and updated as indicated. Interim medical history since our last visit reviewed. Allergies and medications reviewed and updated. Current Outpatient Prescriptions on File Prior to Visit  Medication Sig  . albuterol (VENTOLIN HFA) 108 (90 BASE) MCG/ACT inhaler Inhale 2 puffs into the lungs every 6 (six) hours as needed for wheezing or  shortness of breath.  Marland Kitchen. aspirin 81 MG tablet Take 81 mg by mouth daily.  . Coenzyme Q10 (COQ10) 100 MG CAPS Take 100 mg by mouth daily.   . Garlic (GARLIQUE PO) Take 1 tablet by mouth daily.  . Glucosamine-Chondroitin (OSTEO BI-FLEX REGULAR STRENGTH PO) Take 2 tablets by mouth daily with supper.  Marland Kitchen. glucose blood test strip ONE TOUCH ULTRA MINI Use to check sugar daily. Dx:E11.9  . levocetirizine (XYZAL) 5 MG tablet TAKE ONE TABLET BY MOUTH EACH EVENING  . Melatonin 5 MG TABS Take 5 mg by mouth at bedtime.   . mometasone-formoterol (DULERA) 200-5 MCG/ACT AERO Inhale 2 puffs into the lungs 2 (two) times daily.  . montelukast (SINGULAIR) 10 MG tablet TAKE ONE TABLET BY MOUTH EVERY NIGHT AT BEDTIME  . Multiple Vitamin (MULTIVITAMIN WITH MINERALS) TABS tablet Take 1 tablet by mouth daily.  . Multiple Vitamins-Minerals (EYE VITAMINS) CAPS Take 1 capsule by mouth daily.  . NONFORMULARY OR COMPOUNDED ITEM Allergy Vaccine 1:50 Given at [facility]  . Polyvinyl Alcohol-Povidone (REFRESH OP) Place 1 drop into both eyes daily.   . sodium chloride (BRONCHO SALINE) inhaler solution Take 1 spray by nebulization as needed.  . Vitamin D, Cholecalciferol, 400 UNITS CAPS Take 1 capsule by mouth daily.   No current facility-administered medications on file prior to visit.     Review of Systems  Constitutional: Negative for activity change, appetite change, chills, fatigue, fever and unexpected weight change.  HENT: Negative for hearing loss.   Eyes: Negative for visual disturbance.  Respiratory: Positive for cough and wheezing. Negative  for chest tightness and shortness of breath.   Cardiovascular: Negative for chest pain, palpitations and leg swelling.  Gastrointestinal: Negative for abdominal distention, abdominal pain, blood in stool, constipation, diarrhea, nausea and vomiting.  Genitourinary: Negative for difficulty urinating and hematuria.  Musculoskeletal: Negative for arthralgias, myalgias  and neck pain.  Skin: Negative for rash.  Neurological: Negative for dizziness, seizures, syncope and headaches.  Hematological: Negative for adenopathy. Does not bruise/bleed easily.  Psychiatric/Behavioral: Negative for dysphoric mood. The patient is not nervous/anxious.    Per HPI unless specifically indicated in ROS section     Objective:    BP (!) 150/90 (BP Location: Right Arm, Cuff Size: Normal)   Pulse 83   Wt 181 lb (82.1 kg)   SpO2 92%   BMI 28.35 kg/m   Wt Readings from Last 3 Encounters:  02/13/16 181 lb (82.1 kg)  02/09/16 181 lb 4 oz (82.2 kg)  12/29/15 182 lb 9.6 oz (82.8 kg)    Physical Exam  Constitutional: She is oriented to person, place, and time. She appears well-developed and well-nourished. No distress.  HENT:  Head: Normocephalic and atraumatic.  Right Ear: Hearing, tympanic membrane, external ear and ear canal normal.  Left Ear: Hearing, tympanic membrane, external ear and ear canal normal.  Nose: Nose normal.  Mouth/Throat: Uvula is midline, oropharynx is clear and moist and mucous membranes are normal. No oropharyngeal exudate, posterior oropharyngeal edema or posterior oropharyngeal erythema.  Eyes: Conjunctivae and EOM are normal. Pupils are equal, round, and reactive to light. No scleral icterus.  Neck: Normal range of motion. Neck supple. Carotid bruit is not present. No thyromegaly present.  Cardiovascular: Normal rate, regular rhythm, normal heart sounds and intact distal pulses.   No murmur heard. Pulses:      Radial pulses are 2+ on the right side, and 2+ on the left side.  Pulmonary/Chest: Effort normal and breath sounds normal. No respiratory distress. She has no wheezes. She has no rales.  Abdominal: Soft. Bowel sounds are normal. She exhibits no distension and no mass. There is no tenderness. There is no rebound and no guarding.  Musculoskeletal: Normal range of motion. She exhibits no edema.  Lymphadenopathy:    She has no cervical  adenopathy.  Neurological: She is alert and oriented to person, place, and time.  CN grossly intact, station and gait intact  Skin: Skin is warm and dry. No rash noted.  Psychiatric: She has a normal mood and affect. Her behavior is normal. Judgment and thought content normal.  Nursing note and vitals reviewed.  Results for orders placed or performed in visit on 02/09/16  Vitamin D, 25-hydroxy  Result Value Ref Range   VITD 49.58 30.00 - 100.00 ng/mL  Hemoglobin A1c  Result Value Ref Range   Hgb A1c MFr Bld 7.3 (H) 4.6 - 6.5 %  Lipid Panel  Result Value Ref Range   Cholesterol 287 (H) 0 - 200 mg/dL   Triglycerides 981.1 (H) 0.0 - 149.0 mg/dL   HDL 91.47 >82.95 mg/dL   VLDL 62.1 0.0 - 30.8 mg/dL   LDL Cholesterol 657 (H) 0 - 99 mg/dL   Total CHOL/HDL Ratio 6    NonHDL 241.53   TSH  Result Value Ref Range   TSH 2.37 0.35 - 4.50 uIU/mL  T4, Free  Result Value Ref Range   Free T4 1.25 0.60 - 1.60 ng/dL  CBC with Differential  Result Value Ref Range   WBC 3.9 (L) 4.0 - 10.5 K/uL  RBC 4.41 3.87 - 5.11 Mil/uL   Hemoglobin 13.5 12.0 - 15.0 g/dL   HCT 16.139.6 09.636.0 - 04.546.0 %   MCV 89.8 78.0 - 100.0 fl   MCHC 34.0 30.0 - 36.0 g/dL   RDW 40.913.4 81.111.5 - 91.415.5 %   Platelets 184.0 150.0 - 400.0 K/uL   Neutrophils Relative % 50.9 43.0 - 77.0 %   Lymphocytes Relative 38.3 12.0 - 46.0 %   Monocytes Relative 7.3 3.0 - 12.0 %   Eosinophils Relative 2.9 0.0 - 5.0 %   Basophils Relative 0.6 0.0 - 3.0 %   Neutro Abs 2.0 1.4 - 7.7 K/uL   Lymphs Abs 1.5 0.7 - 4.0 K/uL   Monocytes Absolute 0.3 0.1 - 1.0 K/uL   Eosinophils Absolute 0.1 0.0 - 0.7 K/uL   Basophils Absolute 0.0 0.0 - 0.1 K/uL      Assessment & Plan:   Problem List Items Addressed This Visit    Advanced care planning/counseling discussion    Advanced directives: has living will at home. HCPOA is daughter Deeann Creeracey Desrochers [place] [phone] (cell). In chart (02/2013)      Asthma, moderate persistent    Followed by allergist.  Establishing with new MD Willeen Cass([facility]) as Dr Maple HudsonYoung is decreasing schedule.       Diabetes type 2, controlled (HCC)    Chronic. Mildly deteriorated. Increase metformin to 500mg  bid.       Relevant Medications   metFORMIN (GLUCOPHAGE) 500 MG tablet   losartan (COZAAR) 50 MG tablet   GERD (gastroesophageal reflux disease)    Discussed trial PPI QD dosing instead of BID dosing.      Relevant Medications   omeprazole (PRILOSEC) 40 MG capsule   polyethylene glycol (MIRALAX / GLYCOLAX) packet   Health maintenance examination - Primary    Preventative protocols reviewed and updated unless pt declined. Discussed healthy diet and lifestyle.       Hyperlipidemia    Uncontrolled,LDL 200s. rec f/u with lipid clinic. Discussed I want her on some cholesterol medication.       Relevant Medications   triamterene-hydrochlorothiazide (DYAZIDE) 37.5-25 MG capsule   losartan (COZAAR) 50 MG tablet   Hypertension    Chronic, deteriorated. Will increase losartan to 75mg  daily. Continue maxzide.  Pt will monitor bp at home.       Relevant Medications   triamterene-hydrochlorothiazide (DYAZIDE) 37.5-25 MG capsule   losartan (COZAAR) 50 MG tablet   Hypothyroidism    Chronic, stable. Continue synthroid 100mcg daily DAW - she takes at 3am.      Relevant Medications   SYNTHROID 100 MCG tablet   Osteoporosis with fracture    Reviewed weight bearing exercise. Pt not interested in prolia, oral or IV bisphosphonate. Will continue calcitonin for now.           Follow up plan: Return in about 6 months (around 08/13/2016) for follow up visit.  Eustaquio BoydenJavier Donise Woodle, MD

## 2016-02-13 NOTE — Assessment & Plan Note (Signed)
Chronic, stable. Continue synthroid daily DAW - she takes at 3am.

## 2016-02-13 NOTE — Patient Instructions (Addendum)
Cholesterol is too high - call to check on study. I do want you on a cholesterol medicine.  Increase losartan to '75mg'$  daily (1.5 tablets daily).  Continue regular walking and weight bearing exercise to keep bones strong. Continue calcitonin.  Good to see you today, call us with questions. Return as needed or in 6 months for follow up visit.   Health Maintenance, Female Introduction Adopting a healthy lifestyle and getting preventive care can go a long way to promote health and wellness. Talk with your health care provider about what schedule of regular examinations is right for you. This is a good chance for you to check in with your provider about disease prevention and staying healthy. In between checkups, there are plenty of things you can do on your own. Experts have done a lot of research about which lifestyle changes and preventive measures are most likely to keep you healthy. Ask your health care provider for more information. Weight and diet Eat a healthy diet  Be sure to include plenty of vegetables, fruits, low-fat dairy products, and lean protein.  Do not eat a lot of foods high in solid fats, added sugars, or salt.  Get regular exercise. This is one of the most important things you can do for your health.  Most adults should exercise for at least 150 minutes each week. The exercise should increase your heart rate and make you sweat (moderate-intensity exercise).  Most adults should also do strengthening exercises at least twice a week. This is in addition to the moderate-intensity exercise. Maintain a healthy weight  Body mass index (BMI) is a measurement that can be used to identify possible weight problems. It estimates body fat based on height and weight. Your health care provider can help determine your BMI and help you achieve or maintain a healthy weight.  For females 68 years of age and older:  A BMI below 18.5 is considered underweight.  A BMI of 18.5 to 24.9 is  normal.  A BMI of 25 to 29.9 is considered overweight.  A BMI of 30 and above is considered obese. Watch levels of cholesterol and blood lipids  You should start having your blood tested for lipids and cholesterol at 75 years of age, then have this test every 5 years.  You may need to have your cholesterol levels checked more often if:  Your lipid or cholesterol levels are high.  You are older than 74 years of age.  You are at high risk for heart disease. Cancer screening Lung Cancer  Lung cancer screening is recommended for adults 62-37 years old who are at high risk for lung cancer because of a history of smoking.  A yearly low-dose CT scan of the lungs is recommended for people who:  Currently smoke.  Have quit within the past 15 years.  Have at least a 30-pack-year history of smoking. A pack year is smoking an average of one pack of cigarettes a day for 1 year.  Yearly screening should continue until it has been 15 years since you quit.  Yearly screening should stop if you develop a health problem that would prevent you from having lung cancer treatment. Breast Cancer  Practice breast self-awareness. This means understanding how your breasts normally appear and feel.  It also means doing regular breast self-exams. Let your health care provider know about any changes, no matter how small.  If you are in your 20s or 30s, you should have a clinical breast exam (CBE) by  a health care provider every 1-3 years as part of a regular health exam.  If you are 8 or older, have a CBE every year. Also consider having a breast X-ray (mammogram) every year.  If you have a family history of breast cancer, talk to your health care provider about genetic screening.  If you are at high risk for breast cancer, talk to your health care provider about having an MRI and a mammogram every year.  Breast cancer gene (BRCA) assessment is recommended for women who have family members with  BRCA-related cancers. BRCA-related cancers include:  Breast.  Ovarian.  Tubal.  Peritoneal cancers.  Results of the assessment will determine the need for genetic counseling and BRCA1 and BRCA2 testing. Cervical Cancer  Your health care provider may recommend that you be screened regularly for cancer of the pelvic organs (ovaries, uterus, and vagina). This screening involves a pelvic examination, including checking for microscopic changes to the surface of your cervix (Pap test). You may be encouraged to have this screening done every 3 years, beginning at age 73.  For women ages 33-65, health care providers may recommend pelvic exams and Pap testing every 3 years, or they may recommend the Pap and pelvic exam, combined with testing for human papilloma virus (HPV), every 5 years. Some types of HPV increase your risk of cervical cancer. Testing for HPV may also be done on women of any age with unclear Pap test results.  Other health care providers may not recommend any screening for nonpregnant women who are considered low risk for pelvic cancer and who do not have symptoms. Ask your health care provider if a screening pelvic exam is right for you.  If you have had past treatment for cervical cancer or a condition that could lead to cancer, you need Pap tests and screening for cancer for at least 20 years after your treatment. If Pap tests have been discontinued, your risk factors (such as having a new sexual partner) need to be reassessed to determine if screening should resume. Some women have medical problems that increase the chance of getting cervical cancer. In these cases, your health care provider may recommend more frequent screening and Pap tests. Colorectal Cancer  This type of cancer can be detected and often prevented.  Routine colorectal cancer screening usually begins at 75 years of age and continues through 75 years of age.  Your health care provider may recommend screening at  an earlier age if you have risk factors for colon cancer.  Your health care provider may also recommend using home test kits to check for hidden blood in the stool.  A small camera at the end of a tube can be used to examine your colon directly (sigmoidoscopy or colonoscopy). This is done to check for the earliest forms of colorectal cancer.  Routine screening usually begins at age 80.  Direct examination of the colon should be repeated every 5-10 years through 75 years of age. However, you may need to be screened more often if early forms of precancerous polyps or small growths are found. Skin Cancer  Check your skin from head to toe regularly.  Tell your health care provider about any new moles or changes in moles, especially if there is a change in a mole's shape or color.  Also tell your health care provider if you have a mole that is larger than the size of a pencil eraser.  Always use sunscreen. Apply sunscreen liberally and repeatedly throughout the  day.  Protect yourself by wearing long sleeves, pants, a wide-brimmed hat, and sunglasses whenever you are outside. Heart disease, diabetes, and high blood pressure  High blood pressure causes heart disease and increases the risk of stroke. High blood pressure is more likely to develop in:  People who have blood pressure in the high end of the normal range (130-139/85-89 mm Hg).  People who are overweight or obese.  People who are African American.  If you are 69-40 years of age, have your blood pressure checked every 3-5 years. If you are 77 years of age or older, have your blood pressure checked every year. You should have your blood pressure measured twice-once when you are at a hospital or clinic, and once when you are not at a hospital or clinic. Record the average of the two measurements. To check your blood pressure when you are not at a hospital or clinic, you can use:  An automated blood pressure machine at a pharmacy.  A  home blood pressure monitor.  If you are between 69 years and 74 years old, ask your health care provider if you should take aspirin to prevent strokes.  Have regular diabetes screenings. This involves taking a blood sample to check your fasting blood sugar level.  If you are at a normal weight and have a low risk for diabetes, have this test once every three years after 75 years of age.  If you are overweight and have a high risk for diabetes, consider being tested at a younger age or more often. Preventing infection Hepatitis B  If you have a higher risk for hepatitis B, you should be screened for this virus. You are considered at high risk for hepatitis B if:  You were born in a country where hepatitis B is common. Ask your health care provider which countries are considered high risk.  Your parents were born in a high-risk country, and you have not been immunized against hepatitis B (hepatitis B vaccine).  You have HIV or AIDS.  You use needles to inject street drugs.  You live with someone who has hepatitis B.  You have had sex with someone who has hepatitis B.  You get hemodialysis treatment.  You take certain medicines for conditions, including cancer, organ transplantation, and autoimmune conditions. Hepatitis C  Blood testing is recommended for:  Everyone born from [birth year] through 1965.  Anyone with known risk factors for hepatitis C. Sexually transmitted infections (STIs)  You should be screened for sexually transmitted infections (STIs) including gonorrhea and chlamydia if:  You are sexually active and are younger than 75 years of age.  You are older than 75 years of age and your health care provider tells you that you are at risk for this type of infection.  Your sexual activity has changed since you were last screened and you are at an increased risk for chlamydia or gonorrhea. Ask your health care provider if you are at risk.  If you do not have HIV, but are  at risk, it may be recommended that you take a prescription medicine daily to prevent HIV infection. This is called pre-exposure prophylaxis (PrEP). You are considered at risk if:  You are sexually active and do not regularly use condoms or know the HIV status of your partner(s).  You take drugs by injection.  You are sexually active with a partner who has HIV. Talk with your health care provider about whether you are at high risk of being infected  with HIV. If you choose to begin PrEP, you should first be tested for HIV. You should then be tested every 3 months for as long as you are taking PrEP. Pregnancy  If you are premenopausal and you may become pregnant, ask your health care provider about preconception counseling.  If you may become pregnant, take 400 to 800 micrograms (mcg) of folic acid every day.  If you want to prevent pregnancy, talk to your health care provider about birth control (contraception). Osteoporosis and menopause  Osteoporosis is a disease in which the bones lose minerals and strength with aging. This can result in serious bone fractures. Your risk for osteoporosis can be identified using a bone density scan.  If you are 23 years of age or older, or if you are at risk for osteoporosis and fractures, ask your health care provider if you should be screened.  Ask your health care provider whether you should take a calcium or vitamin D supplement to lower your risk for osteoporosis.  Menopause may have certain physical symptoms and risks.  Hormone replacement therapy may reduce some of these symptoms and risks. Talk to your health care provider about whether hormone replacement therapy is right for you. Follow these instructions at home:  Schedule regular health, dental, and eye exams.  Stay current with your immunizations.  Do not use any tobacco products including cigarettes, chewing tobacco, or electronic cigarettes.  If you are pregnant, do not drink  alcohol.  If you are breastfeeding, limit how much and how often you drink alcohol.  Limit alcohol intake to no more than 1 drink per day for nonpregnant women. One drink equals 12 ounces of beer, 5 ounces of wine, or 1 ounces of hard liquor.  Do not use street drugs.  Do not share needles.  Ask your health care provider for help if you need support or information about quitting drugs.  Tell your health care provider if you often feel depressed.  Tell your health care provider if you have ever been abused or do not feel safe at home. This information is not intended to replace advice given to you by your health care provider. Make sure you discuss any questions you have with your health care provider. Document Released: 08/24/2010 Document Revised: 07/17/2015 Document Reviewed: 11/12/2014  2017 Elsevier

## 2016-02-13 NOTE — Assessment & Plan Note (Signed)
Reviewed weight bearing exercise. Pt not interested in prolia, oral or IV bisphosphonate. Will continue calcitonin for now.

## 2016-02-13 NOTE — Assessment & Plan Note (Signed)
Chronic. Mildly deteriorated. Increase metformin to 500mg  bid.

## 2016-02-13 NOTE — Assessment & Plan Note (Addendum)
Uncontrolled,LDL 200s. rec f/u with lipid clinic. Discussed I want her on some cholesterol medication.

## 2016-02-13 NOTE — Assessment & Plan Note (Addendum)
Chronic, deteriorated. Will increase losartan to 75mg  daily. Continue maxzide.  Pt will monitor bp at home.

## 2016-02-13 NOTE — Assessment & Plan Note (Signed)
Advanced directives: has living will at home. HCPOA is daughter Deeann Creeracey [place] [phone] (cell). In chart (02/2013)

## 2016-02-13 NOTE — Assessment & Plan Note (Signed)
Preventative protocols reviewed and updated unless pt declined. Discussed healthy diet and lifestyle.  

## 2016-02-13 NOTE — Assessment & Plan Note (Signed)
Followed by allergist. Establishing with new MD Willeen Cass([facility]) as Dr Maple HudsonYoung is decreasing schedule.

## 2016-02-13 NOTE — Assessment & Plan Note (Signed)
Discussed trial PPI QD dosing instead of BID dosing.

## 2016-02-19 ENCOUNTER — Ambulatory Visit (INDEPENDENT_AMBULATORY_CARE_PROVIDER_SITE_OTHER): Payer: Medicare Other | Admitting: *Deleted

## 2016-02-19 DIAGNOSIS — J309 Allergic rhinitis, unspecified: Secondary | ICD-10-CM | POA: Diagnosis not present

## 2016-02-25 ENCOUNTER — Telehealth: Payer: Self-pay | Admitting: Interventional Cardiology

## 2016-02-25 DIAGNOSIS — E785 Hyperlipidemia, unspecified: Secondary | ICD-10-CM

## 2016-02-25 NOTE — Telephone Encounter (Signed)
[name] is calling because she would like someone to contact her about in regards to having an Lipid Panel done. She states that she not received any follow-up information yet. Please call, thanks.

## 2016-02-25 NOTE — Telephone Encounter (Signed)
Patient called to say that she was advised that she would be getting enrolled into a lipid research study drug program but has not heard anything else about it. Patient advised that this message would be forwarded to the lipid clinic for further information.

## 2016-02-25 NOTE — Telephone Encounter (Signed)
Pt was seen in lipid clinic in April 2017 by Audrie LiaSally Earl and CLEAR study was mentioned, had not been [facility] up with [facility]. Upon reviewing patient's chart - she will not qualify for CLEAR (LDL too high, has not been intolerant to 2 statins, does not have 10 year ESC risk high enough). Will not qualify for ORION-10 since she does not have ASCVD. May be able to qualify for Praluent neurocog study, although she will need to try another statin first since she has only been intolerant to low dose Crestor 5mg  2 days a week. If she fails another statin, should be eligible as a primary prevention patient since she has DM. Will need to verify that pt does not use melatonin regularly - listed on her profile but will be an exclusion criteria if pt requires daily sleep aid.  LMOM for pt to return call to see if she is willing to rechallenge with a second statin to help with enrollment into neurocog trial.

## 2016-02-26 MED ORDER — ATORVASTATIN CALCIUM 10 MG PO TABS: 10.0000 mg | ORAL_TABLET | Freq: Every day | ORAL | 3 refills | Status: DC

## 2016-02-26 NOTE — Telephone Encounter (Signed)
Pt returned call. Explained above. She is willing to try another statin medication. Will try low dose atorvastatin 10mg  daily. She will also try to wean off melatonin as she will need to be off this to qualify for Neurocog study if unable to tolerate atorvastatin or not at goal. Will plan to recheck Lipid profile in 2 months after start statin. She will call with any additional questions or concern.

## 2016-03-03 ENCOUNTER — Encounter: Payer: Self-pay | Admitting: Internal Medicine

## 2016-03-03 MED ORDER — FLUTICASONE-SALMETEROL 230-21 MCG/ACT IN AERO: 2.0000 | INHALATION_SPRAY | Freq: Two times a day (BID) | RESPIRATORY_TRACT | 12 refills | Status: DC

## 2016-03-03 NOTE — Addendum Note (Signed)
Addended by: Jaynee EaglesLEMONS, Masahiro Iglesia C on: 03/03/2016 04:54 PM   Modules accepted: Orders

## 2016-03-03 NOTE — Telephone Encounter (Signed)
Dr Maple HudsonYoung, please review the email and advise recs thanks  Dr. Maple HudsonYoung, I have just learned that my insurance will no longer cover my asthma medication, Dulera.  I am using my third vial and was thinking that it was helping. Oh well. So much for insurance. (I had  just called in for a refill when I learned this.)  I was told that my options under my insurance could be either Symbicort or Advair. I have used both in the past and did not feel they were helpful.   What do you suggest I do at this point?  I do still have some Albuterol Sulfate that has not expired. It is what is used in a nebulizer.  When I use that thing it really makes me cough a lot during the treatment. Is that normal?  I am at my wits end with this treatment. There is no way I can pay $1,000.00 plus [facility](Dulera cost) each  month for any medication!  I will appreciate any help & advice you can give!!  Thanks, Jordan Patterson

## 2016-03-03 NOTE — Telephone Encounter (Signed)
Suggest we offer Advair HFA, which is a metered pump like Dulera,  230/ 21,  # 1, inhale 2 puffs then rinse mouth, twice daily, refill x 12. Remove Dulera from med list.

## 2016-03-10 ENCOUNTER — Encounter: Payer: Self-pay | Admitting: Family Medicine

## 2016-03-15 NOTE — Addendum Note (Signed)
Addended by: SUPPLE, MEGAN E on: 03/15/2016 04:24 PM   Modules accepted: Orders

## 2016-03-15 NOTE — Telephone Encounter (Signed)
Called pt to f/u with tolerability of Lipitor 10mg  daily. Pt states she has been doing well and has not had any side effects since starting it 3 weeks ago. Will check lipids in 3 weeks, anticipate that LDL will still be far above goal given baseline of 206. Pt has cut out melatonin but is still having trouble sleeping. Advised her that its use on an as needed basis should not exclude her from the Regeneron study. Will forward her information prior to lab work in 3 weeks to ensure that pt would be a good candidate for the study.

## 2016-03-24 ENCOUNTER — Other Ambulatory Visit: Payer: Self-pay | Admitting: Internal Medicine

## 2016-03-24 NOTE — Telephone Encounter (Signed)
Rx refill for Proair, pt med list states ventolin. Will update med list to reflect Proair medication.

## 2016-03-26 NOTE — Addendum Note (Signed)
Addended by: Jamaris Theard E on: 03/26/2016 01:05 PM   Modules accepted: Orders

## 2016-03-26 NOTE — Telephone Encounter (Signed)
Pt called to let us know she is going to stop her Lipitor 10mg  daily because she is experience a cough with it. Advised her that a cough is not a common side effect of statins. She stated she would no longer continue to take the Lipitor. Had previously discussed clinical trials - pt may be a candidate for Regeneron study. She could not be screened for the next month since she just stopped taking her Lipitor. She seems slightly interested but not overly so. Encouraged pt that with her baseline LDL > 200 and intolerances to multiple other statins, this would be our only option left to potentially treat her cholesterol. Will reach out to her in a month to see if she would like to be contacted by research nurses.

## 2016-04-08 ENCOUNTER — Other Ambulatory Visit: Payer: Medicare Other

## 2016-04-09 NOTE — Telephone Encounter (Signed)
Followed up with patient regarding lipid trials. States she is not interested at this time.

## 2016-04-12 ENCOUNTER — Encounter: Payer: Self-pay | Admitting: Internal Medicine

## 2016-04-13 NOTE — Telephone Encounter (Signed)
CY please advise with any further recommendations. Thanks.

## 2016-04-13 NOTE — Telephone Encounter (Signed)
OTC Xyzal and its generic form are available otc and should work fine.  Since we are no longer providing allergy vaccine therapy, she can discuss vaccine therapy with her current allergist going forward.

## 2016-04-14 LAB — HM DIABETES EYE EXAM

## 2016-04-15 ENCOUNTER — Ambulatory Visit: Payer: Medicare Other | Admitting: Gastroenterology

## 2016-04-29 ENCOUNTER — Ambulatory Visit (INDEPENDENT_AMBULATORY_CARE_PROVIDER_SITE_OTHER): Payer: Medicare Other | Admitting: Gastroenterology

## 2016-04-29 ENCOUNTER — Encounter: Payer: Self-pay | Admitting: Gastroenterology

## 2016-04-29 VITALS — BP 146/84 | HR 88 | Ht 66.75 in | Wt 182.5 lb

## 2016-04-29 DIAGNOSIS — R05 Cough: Secondary | ICD-10-CM | POA: Diagnosis not present

## 2016-04-29 DIAGNOSIS — R12 Heartburn: Secondary | ICD-10-CM | POA: Diagnosis not present

## 2016-04-29 DIAGNOSIS — K219 Gastro-esophageal reflux disease without esophagitis: Secondary | ICD-10-CM

## 2016-04-29 DIAGNOSIS — R110 Nausea: Secondary | ICD-10-CM | POA: Diagnosis not present

## 2016-04-29 DIAGNOSIS — R059 Cough, unspecified: Secondary | ICD-10-CM

## 2016-04-29 MED ORDER — RANITIDINE HCL 300 MG PO TABS: 300.0000 mg | ORAL_TABLET | Freq: Every day | ORAL | 3 refills | Status: DC

## 2016-04-29 MED ORDER — OMEPRAZOLE 40 MG PO CPDR: 40.0000 mg | DELAYED_RELEASE_CAPSULE | Freq: Two times a day (BID) | ORAL | 3 refills | Status: DC

## 2016-04-29 NOTE — Progress Notes (Signed)
Jordan Patterson    [id number]    [birth year]  Primary Care Physician:Javier Sharen Hones, MD  Referring Physician: Eustaquio Boyden, MD [address] [place], [place] [address]  Chief complaint:  GERD, Cough  HPI: 76 year old female with history of chronic gastroesophageal acid reflux here for follow-up visit. She was last seen in office in February 2017. Her symptoms were well controlled at the time on omeprazole 40 mg twice daily. She was advised to decrease PPI to once daily by Dr. Sharen Hones she has been having increased heartburn, nausea, cough and also intermittent choking sensation along with regurgitation. She uses back brace for vertebral compression injury and herniated disc. She feels increased reflux symptoms when she bends, coughs or after a meal. Denies any vomiting, abdominal pain, dysphagia, odynophagia, change in bowel habits or blood per rectum. She also has history of asthma. She she went back to twice daily PPI last weekend with some improvement of symptoms .  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 17 mm Savary dilators passed without resistance. She has never had a colonoscopy.   Cologaurd negative March 2017   Outpatient Encounter Prescriptions as of 04/29/2016  Medication Sig  . aspirin 81 MG tablet Take 81 mg by mouth daily.  . calcitonin, salmon, (MIACALCIN/FORTICAL) 200 UNIT/ACT nasal spray USE ONE (1) SPRAY IN ALTERNATING NOSTRILS DAILY  . Coenzyme Q10 (COQ10) 100 MG CAPS Take 100 mg by mouth daily.   . fluticasone-salmeterol (ADVAIR HFA) 230-21 MCG/ACT inhaler Inhale 2 puffs into the lungs 2 (two) times daily.  . Garlic (GARLIQUE PO) Take 1 tablet by mouth daily.  . Glucosamine-Chondroitin (OSTEO BI-FLEX REGULAR STRENGTH PO) Take 2 tablets by mouth daily with supper.  Marland Kitchen glucose blood test strip  ONE TOUCH ULTRA MINI Use to check sugar daily. Dx:E11.9  . losartan (COZAAR) 50 MG tablet Take 1.5 tablets (75 mg total) by mouth daily.  . metFORMIN (GLUCOPHAGE) 500 MG tablet Take 1 tablet (500 mg total) by mouth 2 (two) times daily with a meal.  . montelukast (SINGULAIR) 10 MG tablet TAKE ONE TABLET BY MOUTH EVERY NIGHT AT BEDTIME  . Multiple Vitamin (MULTIVITAMIN WITH MINERALS) TABS tablet Take 1 tablet by mouth daily.  . Multiple Vitamins-Minerals (EYE VITAMINS) CAPS Take 1 capsule by mouth daily.  . NONFORMULARY OR COMPOUNDED ITEM Allergy Vaccine 1:50 Given at [facility]  . omeprazole (PRILOSEC) 40 MG capsule Take 1 capsule (40 mg total) by mouth 2 (two) times daily.  . polyethylene glycol (MIRALAX / GLYCOLAX) packet Take 17 g by mouth daily as needed for moderate constipation.  . Polyvinyl Alcohol-Povidone (REFRESH OP) Place 1 drop into both eyes daily.   Marland Kitchen PROAIR HFA 108 (90 Base) MCG/ACT inhaler INHALE TWO PUFFS INTO THE LUNGS EVERY 6 HOURS AS NEEDED FOR WHEEZING OR SHORTNESS OF BREATH.  . sodium chloride (BRONCHO SALINE) inhaler solution Take 1 spray by nebulization as needed.  Marland Kitchen SYNTHROID 100 MCG tablet Take 1 tablet (100 mcg total) by mouth daily with breakfast.  . triamterene-hydrochlorothiazide (DYAZIDE) 37.5-25 MG capsule Take 1 each (1 capsule total) by mouth every morning.  . Vitamin D, Cholecalciferol, 400 UNITS CAPS Take 1 capsule by mouth daily.  . [DISCONTINUED] levocetirizine (XYZAL) 5 MG tablet TAKE ONE TABLET BY MOUTH EACH EVENING  . [DISCONTINUED] Melatonin 5 MG TABS  Take 5 mg by mouth at bedtime.   . [DISCONTINUED] mometasone-formoterol (DULERA) 200-5 MCG/ACT AERO Inhale 2 puffs into the lungs 2 (two) times daily.   No facility-administered encounter medications on file as of 04/29/2016.     Allergies as of 04/29/2016 - Review Complete 04/29/2016  Allergen Reaction Noted  . Ace inhibitors Cough 05/19/2011  . Amlodipine Cough 05/19/2011  . Crestor  [rosuvastatin] Other (See Comments) 01/24/2014  . Pregabalin Other (See Comments) 05/19/2011  . Tegretol [carbamazepine] Other (See Comments) 05/19/2011  . Wellbutrin [bupropion hcl] Other (See Comments) 05/19/2011  . Cymbalta [duloxetine hcl] Palpitations and Other (See Comments) 05/19/2011  . Lipitor [atorvastatin] Cough 03/26/2016    Past Medical History:  Diagnosis Date  . Allergic rhinitis   . Arthritis    hands and knees - ?osteo  . Asthma   . Chronic headaches   . Compression fracture of T12 vertebra (HCC) 07/18/2014   S/p kyphoplasty by Dr Newell CoralNudelman (08/2014)   . Depression    pt denies  . Diabetes type 2, controlled (HCC) 2004  . Environmental allergies    dust,mold,mildew  . Extrinsic asthma   . GERD (gastroesophageal reflux disease)    Jordan Chance([facility]) EGD - mild esophageal dysmotility and small hiatal hernia  . Hiatal hernia   . Hx of migraines   . Hyperlipidemia    mild, diet controlled  . Hypertension   . Hypothyroidism   . Macular degeneration   . Osteoporosis with fracture 07/2014   T -1.2 hip, T 0.2 spine, T12 compression fracture s/p kyphoplasty    Past Surgical History:  Procedure Laterality Date  . CATARACT EXTRACTION  2003   bilateral with lens implant  . COLONOSCOPY    . DEXA  05/2008   improved (initial DEXA 2007 - mild osteopenia)  . DILATION AND CURETTAGE OF UTERUS  1978  . ESOPHAGOGASTRODUODENOSCOPY  06/25/2011   Procedure: ESOPHAGOGASTRODUODENOSCOPY (EGD);  Surgeon: Hart Carwinora M Brodie, MD;  [facility];  Service: Endoscopy;  Laterality: N/A;  no Xray  . KYPHOPLASTY N/A 08/30/2014   Procedure: THORACIC TWELVE KYPHOPLASTY;  Surgeon: Shirlean Kellyobert Nudelman, MD  . LAPAROSCOPY ABDOMEN DIAGNOSTIC     To R/O endometriosis  . SAVORY DILATION  06/25/2011   Procedure: SAVORY DILATION;  Surgeon: Hart Carwinora M Brodie, MD;  Location: [facility];  Service: Endoscopy;  Laterality: N/A;  . TONSILLECTOMY AND ADENOIDECTOMY  1962  . US ECHOCARDIOGRAPHY  10/2014   nl systolic  function EF 55%, mild diastolic dysfunction    Family History  Problem Relation Age of Onset  . Mitral valve prolapse Father   . Diabetes Father   . Mitral valve prolapse Mother   . Hypertension Mother   . Stroke Mother [id number]    after valve surgery  . Parkinson's disease Brother   . Other Brother     POST WAR TRAUMA  . Stroke Paternal Grandfather   . Heart failure Maternal Grandfather   . Cancer Neg Hx     Social History   Social History  . Marital status: Widowed    Spouse name: N/A  . Number of children: 3  . Years of education: N/A   Occupational History  . Retired    Social History Main Topics  . Smoking status: Never Smoker  . Smokeless tobacco: Never Used  . Alcohol use No  . Drug use: No  . Sexual activity: No   Other Topics Concern  . 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  HENT: Negative.   Eyes: Negative for blurred vision.  Respiratory: Negative for cough, shortness of breath and wheezing.   Cardiovascular: Negative for chest pain and palpitations.  Gastrointestinal: as per HPI Genitourinary: Negative for dysuria, urgency, frequency and hematuria.  Musculoskeletal: Negative for myalgias, back pain and joint pain.  Skin: Negative for itching and rash.  Neurological: Negative for dizziness, tremors, focal weakness, seizures and loss of consciousness.  Endo/Heme/Allergies: Positive for seasonal allergies.  Psychiatric/Behavioral: Negative for depression, suicidal ideas and hallucinations.  All other systems reviewed and are negative.   Physical Exam: Vitals:   04/29/16  1003  BP: (!) 146/84  Pulse: 88   Body mass index is 28.8 kg/m. Gen:      No acute distress HEENT:  EOMI, sclera anicteric Neck:     No masses; no thyromegaly Lungs:    Clear to auscultation bilaterally; normal respiratory effort CV:         Regular rate and rhythm; no murmurs Abd:      + bowel sounds; soft, non-tender; no palpable masses, no distension Ext:    No edema; adequate peripheral perfusion Skin:      Warm and dry; no rash Neuro: alert and oriented x 3 Psych: normal mood and affect  Data Reviewed:  Reviewed labs, radiology imaging, old records and pertinent past GI work up   Assessment and Plan/Recommendations:  76 year old female with history of chronic GERD and asthma here for follow-up with complaints of worsening reflux symptoms  Patient had exacerbation of GERD symptoms when she decreased PPI to once daily, she has increased it back to twice daily a few days ago Add ranitidine 300 mg at bedtime,  next 4-6 weeks patient may try to taper PPI again and hold the evening dose  Advised patient to change the size of the back brace as it appears to be too small and also possibly contributing to worsening of GERD symptoms  Small frequent meals, avoid high fat and high fiber diet  Advise patient to avoid bending and lifting heavy weights  No meals 3 hours before bedtime and sleep with head end elevation  Cologaurd negative March 2017   25 minutes was spent face-to-face with the patient. Greater than 50% of the time used for counseling as well as treatment plan and follow-up. She had multiple questions which were answered to her satisfaction  K. Scherry Ran , MD [phone] Mon-Fri 8a-5p [phone] after 5p, weekends, holidays  CC: Eustaquio Boyden, MD

## 2016-04-29 NOTE — Patient Instructions (Signed)
We have sent your prescriptions to your pharmacy  Prilosec and Zantac

## 2016-08-05 ENCOUNTER — Encounter: Payer: Self-pay | Admitting: Family Medicine

## 2016-08-05 ENCOUNTER — Ambulatory Visit (INDEPENDENT_AMBULATORY_CARE_PROVIDER_SITE_OTHER): Payer: Medicare Other | Admitting: Family Medicine

## 2016-08-05 ENCOUNTER — Telehealth: Payer: Self-pay | Admitting: *Deleted

## 2016-08-05 VITALS — BP 140/76 | HR 88 | Temp 98.3°F | Wt 174.5 lb

## 2016-08-05 DIAGNOSIS — E039 Hypothyroidism, unspecified: Secondary | ICD-10-CM

## 2016-08-05 DIAGNOSIS — I10 Essential (primary) hypertension: Secondary | ICD-10-CM | POA: Diagnosis not present

## 2016-08-05 DIAGNOSIS — K219 Gastro-esophageal reflux disease without esophagitis: Secondary | ICD-10-CM | POA: Diagnosis not present

## 2016-08-05 DIAGNOSIS — E785 Hyperlipidemia, unspecified: Secondary | ICD-10-CM | POA: Diagnosis not present

## 2016-08-05 DIAGNOSIS — M8000XD Age-related osteoporosis with current pathological fracture, unspecified site, subsequent encounter for fracture with routine healing: Secondary | ICD-10-CM | POA: Diagnosis not present

## 2016-08-05 DIAGNOSIS — E119 Type 2 diabetes mellitus without complications: Secondary | ICD-10-CM

## 2016-08-05 LAB — BASIC METABOLIC PANEL
BUN: 12 mg/dL (ref 6–23)
CO2: 28 mEq/L (ref 19–32)
Calcium: 10 mg/dL (ref 8.4–10.5)
Chloride: 98 mEq/L (ref 96–112)
Creatinine, Ser: 0.88 mg/dL (ref 0.40–1.20)
GFR: 66.44 mL/min (ref >60.00)
Glucose, Bld: 134 mg/dL — ABNORMAL HIGH (ref 70–99)
Potassium: 4.6 mEq/L (ref 3.5–5.1)
Sodium: 136 mEq/L (ref 135–145)

## 2016-08-05 LAB — LDL CHOLESTEROL, DIRECT: Direct LDL: 187 mg/dL

## 2016-08-05 LAB — HEMOGLOBIN A1C: Hgb A1c MFr Bld: 6.9 % — ABNORMAL HIGH (ref 4.6–6.5)

## 2016-08-05 MED ORDER — RANITIDINE HCL 300 MG PO TABS: 300.0000 mg | ORAL_TABLET | Freq: Every day | ORAL | 6 refills | Status: DC

## 2016-08-05 NOTE — Assessment & Plan Note (Signed)
Chronic, stable. Continue current regimen. 

## 2016-08-05 NOTE — Progress Notes (Signed)
BP 140/76 (BP Location: Left Arm, Patient Position: Sitting, Cuff Size: Normal)   Pulse 88   Temp 98.3 F (36.8 C) (Oral)   Wt 174 lb 8 oz (79.2 kg)   SpO2 95%   BMI 27.54 kg/m    CC: 6 mo f/u visit Subjective:    Patient ID: Jordan Patterson, female    DOB: [birth year], 76 y.o.   MRN: [id number]  HPI: Jordan Patterson is a 76 y.o. female presenting on 08/05/2016 for Follow-up (6 month f/u)   Osteoporosis with fracture - calcitonin x 2 yrs (06/2014). Declined oral bisphosphonate given severe GERD/HH. Declined prolia and IV bisphosphonate. Desires to continue calcitonin.   HLD - LDL >200, not on statin. Lipitor caused body aches. "lipid clinic wanted me to participate in a study but they never called me."   HTN - bp better controlled with increase in losartan last visit.   GERD - last visit we trialed PPI to QD dosing. This worsened GI sxs. She has returned to BID dosing and added zantac 300mg  at bedtime. She is doing better with this but still notices occasional night time symptoms.   DM - regularly does not check sugars. Compliant with antihyperglycemic regimen which includes: metformin 500mg  bid.  Denies low sugars or hypoglycemic symptoms.  Denies paresthesias. Last diabetic eye exam 03/2016. Pneumovax: 2007. Prevnar: 05/2015. Has increased walking as well.  Lab Results  Component Value Date   HGBA1C 7.3 (H) 02/09/2016   Diabetic Foot Exam - Simple   Simple Foot Form Diabetic Foot exam was performed with the following findings:  Yes 08/05/2016 11:21 AM  Visual Inspection See comments:  Yes Sensation Testing Intact to touch and monofilament testing bilaterally:  Yes Pulse Check Posterior Tibialis and Dorsalis pulse intact bilaterally:  Yes Comments Healing sore R dorsal middle toe      Relevant past medical, surgical, family and social history reviewed and updated as indicated. Interim medical history since our last visit reviewed. Allergies and medications reviewed and  updated. Outpatient Medications Prior to Visit  Medication Sig Dispense Refill  . aspirin 81 MG tablet Take 81 mg by mouth daily.    . calcitonin, salmon, (MIACALCIN/FORTICAL) 200 UNIT/ACT nasal spray USE ONE (1) SPRAY IN ALTERNATING NOSTRILS DAILY 3.7 mL 11  . Coenzyme Q10 (COQ10) 100 MG CAPS Take 100 mg by mouth daily.     . Garlic (GARLIQUE PO) Take 1 tablet by mouth daily.    . Glucosamine-Chondroitin (OSTEO BI-FLEX REGULAR STRENGTH PO) Take 2 tablets by mouth daily with supper.    Marland Kitchen glucose blood test strip ONE TOUCH ULTRA MINI Use to check sugar daily. Dx:E11.9 100 each 3  . losartan (COZAAR) 50 MG tablet Take 1.5 tablets (75 mg total) by mouth daily. 45 tablet 11  . metFORMIN (GLUCOPHAGE) 500 MG tablet Take 1 tablet (500 mg total) by mouth 2 (two) times daily with a meal. 180 tablet 3  . montelukast (SINGULAIR) 10 MG tablet TAKE ONE TABLET BY MOUTH EVERY NIGHT AT BEDTIME 30 tablet 6  . Multiple Vitamin (MULTIVITAMIN WITH MINERALS) TABS tablet Take 1 tablet by mouth daily.    . Multiple Vitamins-Minerals (EYE VITAMINS) CAPS Take 1 capsule by mouth daily.    . NONFORMULARY OR COMPOUNDED ITEM Allergy Vaccine  Given at [facility]    . omeprazole (PRILOSEC) 40 MG capsule Take 1 capsule (40 mg total) by mouth 2 (two) times daily. Before breakfast and dinner 60 capsule 3  . polyethylene glycol (MIRALAX /  GLYCOLAX) packet Take 17 g by mouth daily as needed for moderate constipation.    . Polyvinyl Alcohol-Povidone (REFRESH OP) Place 1 drop into both eyes daily.     Marland Kitchen. PROAIR HFA 108 (90 Base) MCG/ACT inhaler INHALE TWO PUFFS INTO THE LUNGS EVERY 6 HOURS AS NEEDED FOR WHEEZING OR SHORTNESS OF BREATH. 18 g 5  . sodium chloride (BRONCHO SALINE) inhaler solution Take 1 spray by nebulization as needed.    Marland Kitchen. SYNTHROID 100 MCG tablet Take 1 tablet (100 mcg total) by mouth daily with breakfast. 30 tablet 11  . triamterene-hydrochlorothiazide (DYAZIDE) 37.5-25 MG capsule Take 1 each (1 capsule total)  by mouth every morning. 30 capsule 11  . Vitamin D, Cholecalciferol, 400 UNITS CAPS Take 1 capsule by mouth daily.    . ranitidine (ZANTAC) 300 MG tablet Take 1 tablet (300 mg total) by mouth at bedtime. 30 tablet 3  . fluticasone-salmeterol (ADVAIR HFA) 230-21 MCG/ACT inhaler Inhale 2 puffs into the lungs 2 (two) times daily. 1 Inhaler 12  . omeprazole (PRILOSEC) 40 MG capsule Take 1 capsule (40 mg total) by mouth 2 (two) times daily. 60 capsule 11   No facility-administered medications prior to visit.      Per HPI unless specifically indicated in ROS section below Review of Systems     Objective:    BP 140/76 (BP Location: Left Arm, Patient Position: Sitting, Cuff Size: Normal)   Pulse 88   Temp 98.3 F (36.8 C) (Oral)   Wt 174 lb 8 oz (79.2 kg)   SpO2 95%   BMI 27.54 kg/m   Wt Readings from Last 3 Encounters:  08/05/16 174 lb 8 oz (79.2 kg)  04/29/16 182 lb 8 oz (82.8 kg)  02/13/16 181 lb (82.1 kg)    Physical Exam  Constitutional: She appears well-developed and well-nourished. No distress.  HENT:  Head: Normocephalic and atraumatic.  Right Ear: External ear normal.  Left Ear: External ear normal.  Nose: Nose normal.  Mouth/Throat: Oropharynx is clear and moist. No oropharyngeal exudate.  Eyes: Conjunctivae and EOM are normal. Pupils are equal, round, and reactive to light. No scleral icterus.  Neck: Normal range of motion. Neck supple.  Cardiovascular: Normal rate, regular rhythm, normal heart sounds and intact distal pulses.   No murmur heard. Pulmonary/Chest: Effort normal and breath sounds normal. No respiratory distress. She has no wheezes. She has no rales.  Musculoskeletal: She exhibits no edema.  See HPI for foot exam if done  Lymphadenopathy:    She has no cervical adenopathy.  Skin: Skin is warm and dry. No rash noted.  Psychiatric: She has a normal mood and affect.  Nursing note and vitals reviewed.  Results for orders placed or performed in visit on  08/05/16  HM DIABETES EYE EXAM  Result Value Ref Range   HM Diabetic Eye Exam No Retinopathy No Retinopathy      Assessment & Plan:   Problem List Items Addressed This Visit    Diabetes type 2, controlled (HCC) - Primary    Chronic, update A1c. Foot exam today. UTD eye exam and pneumococcal vaccinations.       Relevant Orders   Basic metabolic panel   Hemoglobin A1c   GERD (gastroesophageal reflux disease)    Back on PPI BID + zantac. Suggested trial Qdaily PPI with zantac.       Relevant Medications   ranitidine (ZANTAC) 300 MG tablet   Hyperlipidemia    Chronic, uncontrolled. Intolerance to statins (lipitor, crestor), not  willing to try other statin. Will check dLDL and then likely touch base with pharm clinic to see if pt eligible for any lipid studies.      Relevant Orders   LDL Cholesterol, Direct   Hypertension    Chronic, stable. Continue current regimen.       Hypothyroidism    Chronic, stable. Reports continued compliance with brand synthroid      Osteoporosis with fracture    Compliant with calcitonin started 06/2014. Encouraged change to prolia - pt states previously unaffordable. Will ask Marcelline Mates to check into cost for patient.           Follow up plan: Return in about 6 months (around 02/04/2017) for medicare wellness visit, annual exam, prior fasting for blood work.  Eustaquio Boyden, MD

## 2016-08-05 NOTE — Assessment & Plan Note (Signed)
Chronic, uncontrolled. Intolerance to statins (lipitor, crestor), not willing to try other statin. Will check dLDL and then likely touch base with pharm clinic to see if pt eligible for any lipid studies.

## 2016-08-05 NOTE — Telephone Encounter (Signed)
Information has been submitted to pts insurance for verification of benefits. Awaiting response for coverage  

## 2016-08-05 NOTE — Assessment & Plan Note (Signed)
Back on PPI BID + zantac. Suggested trial Qdaily PPI with zantac.

## 2016-08-05 NOTE — Patient Instructions (Addendum)
Labs today.  I have refilled zantac. Try omeprazole once daily. If not improving with this, return to twice daily.  I will ask Marcelline MatesWaynetta to check on cost of prolia for you.  Continue current medicines. Return as needed or in 6 months for medicare wellness visit with Virl AxeLesia and physical.

## 2016-08-05 NOTE — Assessment & Plan Note (Signed)
Chronic, stable. Reports continued compliance with brand synthroid

## 2016-08-05 NOTE — Assessment & Plan Note (Signed)
Chronic, update A1c. Foot exam today. UTD eye exam and pneumococcal vaccinations.

## 2016-08-05 NOTE — Telephone Encounter (Signed)
-----   Message from Eustaquio BoydenJavier Gutierrez, MD sent at 08/05/2016 11:13 AM EDT ----- Can we check on cost for patient if she decides to take prolia? Thank you.

## 2016-08-05 NOTE — Assessment & Plan Note (Addendum)
Compliant with calcitonin started 06/2014. Encouraged change to prolia - pt states previously unaffordable. Will ask Marcelline MatesWaynetta to check into cost for patient.

## 2016-08-08 ENCOUNTER — Telehealth: Payer: Self-pay | Admitting: Family Medicine

## 2016-08-08 NOTE — Telephone Encounter (Signed)
I spoke with patient at [facility] about my concern with her untreated HLD due to statin intolerances (lipitor, crestor). She seemed open to possible research study. [facility] who had spoken with patient previously to see if there's any current study options available. Thank you.

## 2016-08-10 NOTE — Telephone Encounter (Signed)
Thanks for forwarding - I'm glad you had better luck in convincing her to hear about clinical trial options. She should be a good candidate for our CLEAR trial studying bempedoic acid - I have left her a message to discuss with her and will coordinate with our research nurses if she would like to pursue this.

## 2016-09-02 NOTE — Telephone Encounter (Signed)
Verification of benefits have been processed and an approval has been received for pts prolia injection. Pts estimated cost are appx $65. This is only an estimate and cannot be confirmed until benefits are paid. Please advise pt and schedule if needed. If scheduled, once the injection is received, pls contact me back with the date it was received so that I am able to update prolia folder. thanks  

## 2016-09-03 NOTE — Telephone Encounter (Signed)
Left detailed message.  Pt to call to schedule prolia.

## 2016-09-07 ENCOUNTER — Other Ambulatory Visit: Payer: Self-pay | Admitting: Internal Medicine

## 2016-09-24 NOTE — Telephone Encounter (Signed)
Spoke to pt and advised we had not spoken to her regarding prolia injection. Benefits reviewed and pt states she will contact office back in a couple of days to confirm if she is wanting to proceed.

## 2016-10-20 ENCOUNTER — Encounter: Payer: Self-pay | Admitting: Family Medicine

## 2016-10-26 ENCOUNTER — Other Ambulatory Visit: Payer: Self-pay | Admitting: Obstetrics and Gynecology

## 2016-10-26 DIAGNOSIS — Z1231 Encounter for screening mammogram for malignant neoplasm of breast: Secondary | ICD-10-CM

## 2016-10-28 ENCOUNTER — Encounter: Payer: Self-pay | Admitting: Family Medicine

## 2016-11-12 ENCOUNTER — Encounter: Payer: Self-pay | Admitting: Family Medicine

## 2016-11-19 ENCOUNTER — Ambulatory Visit: Payer: Medicare Other

## 2016-11-23 ENCOUNTER — Ambulatory Visit (INDEPENDENT_AMBULATORY_CARE_PROVIDER_SITE_OTHER): Payer: Medicare Other

## 2016-11-23 DIAGNOSIS — Z23 Encounter for immunization: Secondary | ICD-10-CM

## 2016-12-02 ENCOUNTER — Ambulatory Visit: Payer: Medicare Other

## 2017-01-05 ENCOUNTER — Other Ambulatory Visit: Payer: Self-pay | Admitting: Internal Medicine

## 2017-01-26 ENCOUNTER — Ambulatory Visit
Admission: RE | Admit: 2017-01-26 | Discharge: 2017-01-26 | Disposition: A | Discharge status: Home / Self Care | Payer: Medicare Other | Source: Ambulatory Visit | Attending: Obstetrics and Gynecology | Admitting: Obstetrics and Gynecology

## 2017-01-26 DIAGNOSIS — Z1231 Encounter for screening mammogram for malignant neoplasm of breast: Secondary | ICD-10-CM

## 2017-02-09 ENCOUNTER — Ambulatory Visit (INDEPENDENT_AMBULATORY_CARE_PROVIDER_SITE_OTHER): Payer: Medicare Other

## 2017-02-09 VITALS — BP 132/90 | HR 77 | Temp 98.6°F | Ht 67.5 in | Wt 168.8 lb

## 2017-02-09 DIAGNOSIS — M810 Age-related osteoporosis without current pathological fracture: Secondary | ICD-10-CM

## 2017-02-09 DIAGNOSIS — E785 Hyperlipidemia, unspecified: Secondary | ICD-10-CM

## 2017-02-09 DIAGNOSIS — Z0000 Encounter for general adult medical examination without abnormal findings: Secondary | ICD-10-CM

## 2017-02-09 DIAGNOSIS — E119 Type 2 diabetes mellitus without complications: Secondary | ICD-10-CM | POA: Diagnosis not present

## 2017-02-09 DIAGNOSIS — E039 Hypothyroidism, unspecified: Secondary | ICD-10-CM

## 2017-02-09 DIAGNOSIS — Z794 Long term (current) use of insulin: Secondary | ICD-10-CM

## 2017-02-09 DIAGNOSIS — I10 Essential (primary) hypertension: Secondary | ICD-10-CM | POA: Diagnosis not present

## 2017-02-09 LAB — LIPID PANEL
Cholesterol: 246 mg/dL — ABNORMAL HIGH (ref 0–200)
HDL: 46.2 mg/dL (ref >39.00)
LDL Cholesterol: 168 mg/dL — ABNORMAL HIGH (ref 0–99)
NonHDL: 199.56
Total CHOL/HDL Ratio: 5
Triglycerides: 157 mg/dL — ABNORMAL HIGH (ref 0.0–149.0)
VLDL: 31.4 mg/dL (ref 0.0–40.0)

## 2017-02-09 LAB — CBC WITH DIFFERENTIAL/PLATELET
Basophils Absolute: 0 10*3/uL (ref 0.0–0.1)
Basophils Relative: 0.7 % (ref 0.0–3.0)
Eosinophils Absolute: 0.1 10*3/uL (ref 0.0–0.7)
Eosinophils Relative: 2.9 % (ref 0.0–5.0)
HCT: 38.2 % (ref 36.0–46.0)
Hemoglobin: 12.8 g/dL (ref 12.0–15.0)
Lymphocytes Relative: 43.5 % (ref 12.0–46.0)
Lymphs Abs: 1.4 10*3/uL (ref 0.7–4.0)
MCHC: 33.4 g/dL (ref 30.0–36.0)
MCV: 91.1 fl (ref 78.0–100.0)
Monocytes Absolute: 0.3 10*3/uL (ref 0.1–1.0)
Monocytes Relative: 8.8 % (ref 3.0–12.0)
Neutro Abs: 1.4 10*3/uL (ref 1.4–7.7)
Neutrophils Relative %: 44.1 % (ref 43.0–77.0)
Platelets: 190 10*3/uL (ref 150.0–400.0)
RBC: 4.19 Mil/uL (ref 3.87–5.11)
RDW: 13.4 % (ref 11.5–15.5)
WBC: 3.3 10*3/uL — ABNORMAL LOW (ref 4.0–10.5)

## 2017-02-09 LAB — LDL CHOLESTEROL, DIRECT: Direct LDL: 187 mg/dL

## 2017-02-09 LAB — T4, FREE: Free T4: 1.74 ng/dL — ABNORMAL HIGH (ref 0.60–1.60)

## 2017-02-09 LAB — HEMOGLOBIN A1C: Hgb A1c MFr Bld: 6.7 % — ABNORMAL HIGH (ref 4.6–6.5)

## 2017-02-09 LAB — TSH: TSH: 4 u[IU]/mL (ref 0.35–4.50)

## 2017-02-09 LAB — VITAMIN D 25 HYDROXY (VIT D DEFICIENCY, FRACTURES): VITD: 36.73 ng/mL (ref 30.00–100.00)

## 2017-02-09 NOTE — Patient Instructions (Signed)
Ms. Ladona Ridgelaylor , Thank you for taking time to come for your Medicare Wellness Visit. I appreciate your ongoing commitment to your health goals. Please review the following plan we discussed and let me know if I can assist you in the future.   These are the goals we discussed: Goals    . Follow up with Primary Care Provider     Starting 02/09/2017, I will continue to take medications as prescribed and to keep appointments with PCP as scheduled.        This is a list of the screening recommended for you and due dates:  Health Maintenance  Topic Date Due  . DTaP/Tdap/Td vaccine (1 - Tdap) 01/13/2022*  . Eye exam for diabetics  04/14/2017  . Complete foot exam   08/05/2017  . Hemoglobin A1C  08/10/2017  . Tetanus Vaccine  01/13/2022  . Flu Shot  Completed  . DEXA scan (bone density measurement)  Completed  . Pneumonia vaccines  Completed  *Topic was postponed. The date shown is not the original due date.   Preventive Care for Adults  A healthy lifestyle and preventive care can promote health and wellness. Preventive health guidelines for adults include the following key practices.  . A routine yearly physical is a good way to check with your health care provider about your health and preventive screening. It is a chance to share any concerns and updates on your health and to receive a thorough exam.  . Visit your dentist for a routine exam and preventive care every 6 months. Brush your teeth twice a day and floss once a day. Good oral hygiene prevents tooth decay and gum disease.  . The frequency of eye exams is based on your age, health, family medical history, use  of contact lenses, and other factors. Follow your health care provider's recommendations for frequency of eye exams.  . Eat a healthy diet. Foods like vegetables, fruits, whole grains, low-fat dairy products, and lean protein foods contain the nutrients you need without too many calories. Decrease your intake of foods high in  solid fats, added sugars, and salt. Eat the right amount of calories for you. Get information about a proper diet from your health care provider, if necessary.  . Regular physical exercise is one of the most important things you can do for your health. Most adults should get at least 150 minutes of moderate-intensity exercise (any activity that increases your heart rate and causes you to sweat) each week. In addition, most adults need muscle-strengthening exercises on 2 or more days a week.  Silver Sneakers may be a benefit available to you. To determine eligibility, you may visit the website: www.silversneakers.com or contact program at [phone] Mon-Fri between 8AM-8PM.   . Maintain a healthy weight. The body mass index (BMI) is a screening tool to identify possible weight problems. It provides an estimate of body fat based on height and weight. Your health care provider can find your BMI and can help you achieve or maintain a healthy weight.   For adults 20 years and older: ? A BMI below 18.5 is considered underweight. ? A BMI of 18.5 to 24.9 is normal. ? A BMI of 25 to 29.9 is considered overweight. ? A BMI of 30 and above is considered obese.   . Maintain normal blood lipids and cholesterol levels by exercising and minimizing your intake of saturated fat. Eat a balanced diet with plenty of fruit and vegetables. Blood tests for lipids and cholesterol should  begin at age 13 and be repeated every 5 years. If your lipid or cholesterol levels are high, you are over 50, or you are at high risk for heart disease, you may need your cholesterol levels checked more frequently. Ongoing high lipid and cholesterol levels should be treated with medicines if diet and exercise are not working.  . If you smoke, find out from your health care provider how to quit. If you do not use tobacco, please do not start.  . If you choose to drink alcohol, please do not consume more than 2 drinks per day. One drink  is considered to be 12 ounces (355 mL) of beer, 5 ounces (148 mL) of wine, or 1.5 ounces (44 mL) of liquor.  . If you are 32-60 years old, ask your health care provider if you should take aspirin to prevent strokes.  . Use sunscreen. Apply sunscreen liberally and repeatedly throughout the day. You should seek shade when your shadow is shorter than you. Protect yourself by wearing long sleeves, pants, a wide-brimmed hat, and sunglasses year round, whenever you are outdoors.  . Once a month, do a whole body skin exam, using a mirror to look at the skin on your back. Tell your health care provider of new moles, moles that have irregular borders, moles that are larger than a pencil eraser, or moles that have changed in shape or color.

## 2017-02-09 NOTE — Progress Notes (Signed)
Subjective:   Jordan Patterson is a 76 y.o. female who presents for Medicare Annual (Subsequent) preventive examination.  Review of Systems:  N/A Cardiac Risk Factors include: advanced age (>5555men, 68>65 women);diabetes mellitus;dyslipidemia;hypertension     Objective:     Vitals: BP 132/90 (BP Location: Right Arm, Patient Position: Sitting, Cuff Size: Normal) Comment: BP medications not taken  Pulse 77   Temp 98.6 F (37 C) (Oral)   Ht 5' 7.5" (1.715 m) Comment: no shoes  Wt 168 lb 12 oz (76.5 kg)   SpO2 99%   BMI 26.04 kg/m   Body mass index is 26.04 kg/m.  Advanced Directives 02/09/2017 02/09/2016 08/30/2014 06/25/2011  Does Patient Have a Medical Advance Directive? Yes Yes Yes Patient has advance directive, copy not in chart  Type of Advance Directive Healthcare Power of [place];Living will Healthcare Power of [place];Living will Healthcare Power of [place];Living will Healthcare Power of [place];Living will  Copy of Healthcare Power of Attorney in Chart? Yes Yes - -  Pre-existing out of facility DNR order (yellow form or pink MOST form) - - - Other (comment)    Tobacco Social History   Tobacco Use  Smoking Status Never Smoker  Smokeless Tobacco Never Used     Counseling given: No   Clinical Intake:  Pre-visit preparation completed: Yes  Pain : 0-10 Pain Score: 1  Pain Type: Chronic pain Pain Location: Back Pain Orientation: Upper Pain Onset: More than a month ago Pain Frequency: Constant     Nutritional Status: BMI 25 -29 Overweight Nutritional Risks: None Diabetes: Yes CBG done?: No Did pt. bring in CBG monitor from home?: No  How often do you need to have someone help you when you read instructions, pamphlets, or other written materials from your doctor or pharmacy?: 1 - Never What is the last grade level you completed in school?: 12th grade + 2 yrs college  Interpreter Needed?: No  Comments: pt lives with spouse Information entered by ::  LPinson, LPN  Past Medical History:  Diagnosis Date  . Allergic rhinitis   . Arthritis    hands and knees - ?osteo  . Asthma   . Chronic headaches   . Compression fracture of T12 vertebra (HCC) 07/18/2014   S/p kyphoplasty by Dr Newell CoralNudelman (08/2014)   . Depression    pt denies  . Diabetes type 2, controlled (HCC) 2004  . Environmental allergies    dust,mold,mildew  . Extrinsic asthma   . GERD (gastroesophageal reflux disease)    [name]([facility]) EGD - mild esophageal dysmotility and small hiatal hernia  . Hiatal hernia   . Hx of migraines   . Hyperlipidemia    mild, diet controlled  . Hypertension   . Hypothyroidism   . Macular degeneration   . Osteoporosis with fracture 07/2014   T -1.2 hip, T 0.2 spine, T12 compression fracture s/p kyphoplasty   Past Surgical History:  Procedure Laterality Date  . CATARACT EXTRACTION  2003   bilateral with lens implant  . COLONOSCOPY    . DEXA  05/2008   improved (initial DEXA 2007 - mild osteopenia)  . DILATION AND CURETTAGE OF UTERUS  1978  . ESOPHAGOGASTRODUODENOSCOPY  06/25/2011   Procedure: ESOPHAGOGASTRODUODENOSCOPY (EGD);  Surgeon: Hart Carwinora M Brodie, MD;  [facility];  Service: Endoscopy;  Laterality: N/A;  no Xray  . KYPHOPLASTY N/A 08/30/2014   Procedure: THORACIC TWELVE KYPHOPLASTY;  Surgeon: Shirlean Kellyobert Nudelman, MD  . LAPAROSCOPY ABDOMEN DIAGNOSTIC     To R/O endometriosis  .  SAVORY DILATION  06/25/2011   Procedure: SAVORY DILATION;  Surgeon: Hart Carwin, MD;  Location: [facility];  Service: Endoscopy;  Laterality: N/A;  . TONSILLECTOMY AND ADENOIDECTOMY  1962  . US ECHOCARDIOGRAPHY  10/2014   nl systolic function EF 55%, mild diastolic dysfunction   Family History  Problem Relation Age of Onset  . Mitral valve prolapse Father   . Diabetes Father   . Mitral valve prolapse Mother   . Hypertension Mother   . Stroke Mother 58       after valve surgery  . Parkinson's disease Brother   . Other Brother        POST WAR TRAUMA  .  Stroke Paternal Grandfather   . Heart failure Maternal Grandfather   . Cancer Neg Hx    Social History   Socioeconomic History  . Marital status: Widowed    Spouse name: None  . Number of children: 3  . Years of education: None  . Highest education level: None  Social Needs  . Financial resource strain: None  . Food insecurity - worry: None  . Food insecurity - inability: None  . Transportation needs - medical: None  . Transportation needs - non-medical: None  Occupational History  . Occupation: Retired  Tobacco Use  . Smoking status: Never Smoker  . Smokeless tobacco: Never Used  Substance and Sexual Activity  . Alcohol use: No    Alcohol/week: 0.0 oz  . Drug use: No  . Sexual activity: No  Other Topics Concern  . None  Social History Narrative   Caffeine: occasional coffee   Lives alone, widower, 1 cat   Occupation: retired, Diplomatic Services operational officer at [employer]    Edu: some college   Act: works 3d/wk Chemical engineer), lives in [place], gardens, walks   Diet: good water, fruits/vegetables daily      HCPOA is Kaeli Nichelson [place] [phone] (cell)    Outpatient Encounter Medications as of 02/09/2017  Medication Sig  . aspirin 81 MG tablet Take 81 mg by mouth daily.  . calcitonin, salmon, (MIACALCIN/FORTICAL) 200 UNIT/ACT nasal spray USE ONE (1) SPRAY IN ALTERNATING NOSTRILS DAILY  . Coenzyme Q10 (COQ10) 100 MG CAPS Take 100 mg by mouth daily.   . diphenhydramine-acetaminophen (TYLENOL PM) 25-500 MG TABS tablet Take 1 tablet by mouth at bedtime as needed.  . Garlic (GARLIQUE PO) Take 1 tablet by mouth daily.  . Glucosamine-Chondroitin (OSTEO BI-FLEX REGULAR STRENGTH PO) Take 2 tablets by mouth daily with supper.  Marland Kitchen glucose blood test strip ONE TOUCH ULTRA MINI Use to check sugar daily. Dx:E11.9  . losartan (COZAAR) 50 MG tablet Take 1.5 tablets (75 mg total) by mouth daily.  . metFORMIN (GLUCOPHAGE) 500 MG tablet Take 1 tablet (500 mg total) by mouth 2 (two) times daily with a meal.  .  montelukast (SINGULAIR) 10 MG tablet TAKE ONE TABLET BY MOUTH EVERY NIGHT AT BEDTIME  . Multiple Vitamins-Minerals (EYE VITAMINS) CAPS Take 1 capsule by mouth daily.  . NONFORMULARY OR COMPOUNDED ITEM Allergy Vaccine  Given at [facility]  . omeprazole (PRILOSEC) 40 MG capsule Take 1 capsule (40 mg total) by mouth 2 (two) times daily. Before breakfast and dinner  . polyethylene glycol (MIRALAX / GLYCOLAX) packet Take 17 g by mouth daily as needed for moderate constipation.  . Polyvinyl Alcohol-Povidone (REFRESH OP) Place 1 drop into both eyes daily.   Marland Kitchen PROAIR HFA 108 (90 Base) MCG/ACT inhaler INHALE TWO PUFFS INTO THE LUNGS EVERY 6 HOURS AS NEEDED FOR  WHEEZING OR SHORTNESS OF BREATH.  . ranitidine (ZANTAC) 300 MG tablet Take 1 tablet (300 mg total) by mouth at bedtime.  . sodium chloride (BRONCHO SALINE) inhaler solution Take 1 spray by nebulization as needed.  Marland Kitchen. SYNTHROID 100 MCG tablet Take 1 tablet (100 mcg total) by mouth daily with breakfast.  . triamterene-hydrochlorothiazide (DYAZIDE) 37.5-25 MG capsule Take 1 each (1 capsule total) by mouth every morning.  . [DISCONTINUED] Multiple Vitamin (MULTIVITAMIN WITH MINERALS) TABS tablet Take 1 tablet by mouth daily.  . [DISCONTINUED] Vitamin D, Cholecalciferol, 400 UNITS CAPS Take 1 capsule by mouth daily.   No facility-administered encounter medications on file as of 02/09/2017.     Activities of Daily Living In your present state of health, do you have any difficulty performing the following activities: 02/09/2017  Hearing? N  Vision? N  Difficulty concentrating or making decisions? N  Walking or climbing stairs? N  Dressing or bathing? N  Doing errands, shopping? N  Preparing Food and eating ? N  Using the Toilet? N  In the past six months, have you accidently leaked urine? Y  Do you have problems with loss of bowel control? N  Managing your Medications? N  Managing your Finances? N  Housekeeping or managing your Housekeeping?  N  Some recent data might be hidden    Patient Care Team: Eustaquio BoydenGutierrez, Javier, MD as PCP - General (Family Medicine) Eber JonesShah, Rajiv E, MD as Referring Physician (Ophthalmology) Waymon BudgeYoung, Clinton D, MD as Consulting Physician (Pulmonary Disease) Meylor, August Saucerean, DC as Consulting Physician (Chiropractic Medicine) Richrd Humbleslay Burton, DDS as Consulting Physician (Dentistry)    Assessment:   This is a routine wellness examination for Carney BernJean.  Hearing Screening   125Hz  250Hz  500Hz  1000Hz  2000Hz  3000Hz  4000Hz  6000Hz  8000Hz   Right ear:   0 0 40  0    Left ear:   0 0 0  0    Comments: Audiology exam in Oct 2018; needs hearing aids  Vision Screening Comments: Dr. Sherryll BurgerShah in 03/2016    Exercise Activities and Dietary recommendations Current Exercise Habits: The patient does not participate in regular exercise at present, Exercise limited by: None identified  Goals    . Follow up with Primary Care Provider     Starting 02/09/2017, I will continue to take medications as prescribed and to keep appointments with PCP as scheduled.        Fall Risk Fall Risk  02/09/2017 02/09/2016 02/07/2015 01/24/2014 01/22/2013  Falls in the past year? No No Yes No No  Number falls in past yr: - - 1 - -  Injury with Fall? - - Yes - -  Comment - - T12 compression fx - -    Depression Screen PHQ 2/9 Scores 02/09/2017 02/09/2016 02/07/2015 01/24/2014  PHQ - 2 Score 0 0 0 0  PHQ- 9 Score 0 - - -     Cognitive Function MMSE - Mini Mental State Exam 02/09/2017 02/09/2016  Orientation to time 5 5  Orientation to Place 5 5  Registration 3 3  Attention/ Calculation 0 0  Recall 3 3  Language- name 2 objects 0 0  Language- repeat 1 1  Language- follow 3 step command 3 3  Language- read & follow direction 0 0  Write a sentence 0 0  Copy design 0 0  Total score 20 20       PLEASE NOTE: A Mini-Cog screen was completed. Maximum score is 20. A value of 0 denotes this part of Folstein MMSE was  not completed or the patient failed  this part of the Mini-Cog screening.   Mini-Cog Screening Orientation to Time - Max 5 pts Orientation to Place - Max 5 pts Registration - Max 3 pts Recall - Max 3 pts Language Repeat - Max 1 pts Language Follow 3 Step Command - Max 3 pts   Immunization History  Administered Date(s) Administered  . Influenza Whole 11/23/2011, 12/06/2012  . Influenza,inj,Quad PF,6+ Mos 11/06/2014, 10/21/2015, 11/23/2016  . Influenza-Unspecified 11/22/2013  . Pneumococcal Conjugate-13 05/29/2015  . Pneumococcal Polysaccharide-23 10/23/2005  . Td 01/14/2012    Screening Tests Health Maintenance  Topic Date Due  . DTaP/Tdap/Td (1 - Tdap) 01/13/2022 (Originally 01/15/2012)  . OPHTHALMOLOGY EXAM  04/14/2017  . FOOT EXAM  08/05/2017  . HEMOGLOBIN A1C  08/10/2017  . TETANUS/TDAP  01/13/2022  . INFLUENZA VACCINE  Completed  . DEXA SCAN  Completed  . PNA vac Low Risk Adult  Completed      Plan:     I have personally reviewed, addressed, and noted the following in the patient's chart:  A. Medical and social history B. Use of alcohol, tobacco or illicit drugs  C. Current medications and supplements D. Functional ability and status E.  Nutritional status F.  Physical activity G. Advance directives H. List of other physicians I.  Hospitalizations, surgeries, and ER visits in previous 12 months J.  Vitals K. Screenings to include hearing, vision, cognitive, depression L. Referrals and appointments - none  In addition, I have reviewed and discussed with patient certain preventive protocols, quality metrics, and best practice recommendations. A written personalized care plan for preventive services as well as general preventive health recommendations were provided to patient.  See attached scanned questionnaire for additional information.   Signed,   Randa Evens, MHA, BS, LPN Health Coach

## 2017-02-09 NOTE — Progress Notes (Signed)
Pre visit review using our clinic review tool, if applicable. No additional management support is needed unless otherwise documented below in the visit note. 

## 2017-02-09 NOTE — Progress Notes (Signed)
PCP notes:   Health maintenance:  A1C - completed  Abnormal screenings:   Hearing - failed  Hearing Screening   125Hz  250Hz  500Hz  1000Hz  2000Hz  3000Hz  4000Hz  6000Hz  8000Hz   Right ear:   0 0 40  0    Left ear:   0 0 0  0    Comments: Audiology exam in Oct 2018; needs hearing aids  Patient concerns:   None  Nurse concerns:  None  Next PCP appt:   02/18/17 @ 1130

## 2017-02-12 NOTE — Progress Notes (Signed)
I reviewed health advisor's note, was available for consultation, and agree with documentation and plan.  

## 2017-02-18 ENCOUNTER — Encounter: Payer: Self-pay | Admitting: Family Medicine

## 2017-02-18 ENCOUNTER — Ambulatory Visit (INDEPENDENT_AMBULATORY_CARE_PROVIDER_SITE_OTHER): Payer: Medicare Other | Admitting: Family Medicine

## 2017-02-18 VITALS — BP 144/82 | HR 89 | Temp 98.0°F | Ht 68.0 in | Wt 170.0 lb

## 2017-02-18 DIAGNOSIS — N819 Female genital prolapse, unspecified: Secondary | ICD-10-CM

## 2017-02-18 DIAGNOSIS — Z0000 Encounter for general adult medical examination without abnormal findings: Secondary | ICD-10-CM

## 2017-02-18 DIAGNOSIS — R05 Cough: Secondary | ICD-10-CM

## 2017-02-18 DIAGNOSIS — K219 Gastro-esophageal reflux disease without esophagitis: Secondary | ICD-10-CM | POA: Diagnosis not present

## 2017-02-18 DIAGNOSIS — E7801 Familial hypercholesterolemia: Secondary | ICD-10-CM

## 2017-02-18 DIAGNOSIS — I10 Essential (primary) hypertension: Secondary | ICD-10-CM | POA: Diagnosis not present

## 2017-02-18 DIAGNOSIS — H1013 Acute atopic conjunctivitis, bilateral: Secondary | ICD-10-CM

## 2017-02-18 DIAGNOSIS — M8000XD Age-related osteoporosis with current pathological fracture, unspecified site, subsequent encounter for fracture with routine healing: Secondary | ICD-10-CM | POA: Diagnosis not present

## 2017-02-18 DIAGNOSIS — J4540 Moderate persistent asthma, uncomplicated: Secondary | ICD-10-CM

## 2017-02-18 DIAGNOSIS — Z7189 Other specified counseling: Secondary | ICD-10-CM | POA: Diagnosis not present

## 2017-02-18 DIAGNOSIS — E039 Hypothyroidism, unspecified: Secondary | ICD-10-CM | POA: Diagnosis not present

## 2017-02-18 DIAGNOSIS — S22080A Wedge compression fracture of T11-T12 vertebra, initial encounter for closed fracture: Secondary | ICD-10-CM | POA: Diagnosis not present

## 2017-02-18 DIAGNOSIS — E119 Type 2 diabetes mellitus without complications: Secondary | ICD-10-CM

## 2017-02-18 DIAGNOSIS — J309 Allergic rhinitis, unspecified: Secondary | ICD-10-CM

## 2017-02-18 DIAGNOSIS — R053 Chronic cough: Secondary | ICD-10-CM

## 2017-02-18 MED ORDER — VITAMIN D3 25 MCG (1000 UT) PO CAPS
1.0000 | ORAL_CAPSULE | Freq: Every day | ORAL | Status: DC
Start: 2017-02-18 — End: 2022-04-15

## 2017-02-18 NOTE — Progress Notes (Signed)
BP (!) 144/82 (BP Location: Right Arm, Cuff Size: Normal)   Pulse 89   Temp 98 F (36.7 C) (Oral)   Ht 5\' 8"  (1.727 m)   Wt 170 lb (77.1 kg)   SpO2 95%   BMI 25.85 kg/m    CC: CPE  Subjective:    Patient ID: Jordan Patterson, female    DOB: [birth year], 76 y.o.   MRN: [id number]  HPI: Jordan HorsemanJean C Gambill is a 76 y.o. female presenting on 02/18/2017 for Annual Exam (Pt 2)   Saw Virl AxeLesia last week for medicare wellness visit. Note reviewed.   Has been seeing gynecologist for GYN prolapse - referred to urogynecologist 03/2017 Ashley Royalty([place]). Has tried pessary with poor effect and complications (UTI). Considering hysterectomy/bladder tacking. Denies chest pain, tightness, dyspnea. No known CAD.   Osteoporosis with fracture - calcitonin started 06/2014. Declined oral bisphosphonate given severe GERD/HH. Declined prolia or IV bisphosphonate.   HLD - intolerant to statins. No available investigational studies per patient [facility].  GERD controlled with BID PPI.  Trouble sleeping at night - treats with tylenol PM which is effective.  Preventative: Colonoscopy - 2007 WNL Dr. [facility].Cologuard WNL 03/2015.  Mammogram - 01/2017 [facility]1. Requests Q2 yr mammograms. Normal breast exams at home.  Pap - always normal pap smear/pelvic exams. Established with Dr Glenard HaringVernardo [facility].  Recent Dexa 2018 results not available but told osteopenia was stable - h/o osteopenia 2016 (T score -2.2). Vertebral fracture however has upgraded her to osteoporosis.  Lung cancer screening - not eligible. Flu shot done Pneumovax - 2007, prevnar 05/2015 Tetanus - 2013 Shingles shot - has had shingles. Declines.  Advanced directives in chart (02/2013): has living will at home. HCPOA is daughter Deeann Creeracey [place]erby T[place]well [phone] (cell).  Seat belt use discussed Sunscreen use discussed. No changing moles on skin.  Non smoker Alcohol - none  Caffeine: occasional coffee  Lives alone, widower, 1 cat   Occupation: retired, Diplomatic Services operational officersecretary at [employer]  Edu: some college  Act: gardens, walks  Diet: good water, fruits/vegetables daily   Relevant past medical, surgical, family and social history reviewed and updated as indicated. Interim medical history since our last visit reviewed. Allergies and medications reviewed and updated. Outpatient Medications Prior to Visit  Medication Sig Dispense Refill  . aspirin 81 MG tablet Take 81 mg by mouth daily.    . Coenzyme Q10 (COQ10) 100 MG CAPS Take 100 mg by mouth daily.     . diphenhydramine-acetaminophen (TYLENOL PM) 25-500 MG TABS tablet Take 1 tablet by mouth at bedtime as needed.    . Garlic (GARLIQUE PO) Take 1 tablet by mouth daily.    . Glucosamine-Chondroitin (OSTEO BI-FLEX REGULAR STRENGTH PO) Take 2 tablets by mouth daily with supper.    Marland Kitchen. glucose blood test strip ONE TOUCH ULTRA MINI Use to check sugar daily. Dx:E11.9 100 each 3  . losartan (COZAAR) 50 MG tablet Take 1.5 tablets (75 mg total) by mouth daily. 45 tablet 11  . metFORMIN (GLUCOPHAGE) 500 MG tablet Take 1 tablet (500 mg total) by mouth 2 (two) times daily with a meal. 180 tablet 3  . montelukast (SINGULAIR) 10 MG tablet TAKE ONE TABLET BY MOUTH EVERY NIGHT AT BEDTIME 30 tablet 3  . Multiple Vitamins-Minerals (EYE VITAMINS) CAPS Take 1 capsule by mouth daily.    . NONFORMULARY OR COMPOUNDED ITEM Allergy Vaccine  Given at [facility]    . omeprazole (PRILOSEC) 40 MG capsule Take 1 capsule (40  mg total) by mouth 2 (two) times daily. Before breakfast and dinner 60 capsule 3  . polyethylene glycol (MIRALAX / GLYCOLAX) packet Take 17 g by mouth daily as needed for moderate constipation.    . Polyvinyl Alcohol-Povidone (REFRESH OP) Place 1 drop into both eyes daily.     Marland Kitchen PROAIR HFA 108 (90 Base) MCG/ACT inhaler INHALE TWO PUFFS INTO THE LUNGS EVERY 6 HOURS AS NEEDED FOR WHEEZING OR SHORTNESS OF BREATH. 18 g 5  . ranitidine (ZANTAC) 300 MG tablet Take 1 tablet (300 mg total) by mouth at  bedtime. 30 tablet 6  . sodium chloride (BRONCHO SALINE) inhaler solution Take 1 spray by nebulization as needed.    Marland Kitchen SYNTHROID 100 MCG tablet Take 1 tablet (100 mcg total) by mouth daily with breakfast. 30 tablet 11  . triamterene-hydrochlorothiazide (DYAZIDE) 37.5-25 MG capsule Take 1 each (1 capsule total) by mouth every morning. 30 capsule 11  . calcitonin, salmon, (MIACALCIN/FORTICAL) 200 UNIT/ACT nasal spray USE ONE (1) SPRAY IN ALTERNATING NOSTRILS DAILY 3.7 mL 11   No facility-administered medications prior to visit.      Per HPI unless specifically indicated in ROS section below Review of Systems  Constitutional: Negative for activity change, appetite change, chills, fatigue, fever and unexpected weight change.  HENT: Negative for hearing loss.   Eyes: Negative for visual disturbance.  Respiratory: Positive for cough. Negative for chest tightness, shortness of breath and wheezing.   Cardiovascular: Negative for chest pain, palpitations and leg swelling.  Gastrointestinal: Negative for abdominal distention, abdominal pain, blood in stool, constipation, diarrhea, nausea and vomiting.  Genitourinary: Positive for hematuria (with UTI). Negative for difficulty urinating.  Musculoskeletal: Negative for arthralgias, myalgias and neck pain.  Skin: Negative for rash.  Neurological: Negative for dizziness, seizures, syncope and headaches.  Hematological: Negative for adenopathy. Does not bruise/bleed easily.  Psychiatric/Behavioral: Negative for dysphoric mood. The patient is not nervous/anxious.        Objective:    BP (!) 144/82 (BP Location: Right Arm, Cuff Size: Normal)   Pulse 89   Temp 98 F (36.7 C) (Oral)   Ht 5\' 8"  (1.727 m)   Wt 170 lb (77.1 kg)   SpO2 95%   BMI 25.85 kg/m   Wt Readings from Last 3 Encounters:  02/18/17 170 lb (77.1 kg)  02/09/17 168 lb 12 oz (76.5 kg)  08/05/16 174 lb 8 oz (79.2 kg)    Physical Exam  Constitutional: She is oriented to person,  place, and time. She appears well-developed and well-nourished. No distress.  HENT:  Head: Normocephalic and atraumatic.  Right Ear: Hearing, tympanic membrane, external ear and ear canal normal.  Left Ear: Hearing, tympanic membrane, external ear and ear canal normal.  Nose: Nose normal.  Mouth/Throat: Uvula is midline, oropharynx is clear and moist and mucous membranes are normal. No oropharyngeal exudate, posterior oropharyngeal edema or posterior oropharyngeal erythema.  Eyes: Conjunctivae and EOM are normal. Pupils are equal, round, and reactive to light. No scleral icterus.  Neck: Normal range of motion. Neck supple. Carotid bruit is not present. No thyromegaly present.  Cardiovascular: Normal rate, regular rhythm, normal heart sounds and intact distal pulses.  No murmur heard. Pulses:      Radial pulses are 2+ on the right side, and 2+ on the left side.  Pulmonary/Chest: Effort normal and breath sounds normal. No respiratory distress. She has no wheezes. She has no rales.  Abdominal: Soft. Bowel sounds are normal. She exhibits no distension and no mass.  There is no tenderness. There is no rebound and no guarding.  Musculoskeletal: Normal range of motion. She exhibits no edema.  Lymphadenopathy:    She has no cervical adenopathy.  Neurological: She is alert and oriented to person, place, and time.  CN grossly intact, station and gait intact  Skin: Skin is warm and dry. No rash noted.  Psychiatric: She has a normal mood and affect. Her behavior is normal. Judgment and thought content normal.  Nursing note and vitals reviewed.  Results for orders placed or performed in visit on 02/09/17  Vitamin D, 25-hydroxy  Result Value Ref Range   VITD 36.73 30.00 - 100.00 ng/mL  Lipid Panel  Result Value Ref Range   Cholesterol 246 (H) 0 - 200 mg/dL   Triglycerides 161.0157.0 (H) 0.0 - 149.0 mg/dL   HDL 96.0446.20 >54.09>39.00 mg/dL   VLDL 81.131.4 0.0 - 91.440.0 mg/dL   LDL Cholesterol 782168 (H) 0 - 99 mg/dL    Total CHOL/HDL Ratio 5    NonHDL 199.56   Hemoglobin A1c  Result Value Ref Range   Hgb A1c MFr Bld 6.7 (H) 4.6 - 6.5 %  TSH  Result Value Ref Range   TSH 4.00 0.35 - 4.50 uIU/mL  T4, Free  Result Value Ref Range   Free T4 1.74 (H) 0.60 - 1.60 ng/dL  CBC with Differential/Platelet  Result Value Ref Range   WBC 3.3 (L) 4.0 - 10.5 K/uL   RBC 4.19 3.87 - 5.11 Mil/uL   Hemoglobin 12.8 12.0 - 15.0 g/dL   HCT 95.638.2 21.336.0 - 08.646.0 %   MCV 91.1 78.0 - 100.0 fl   MCHC 33.4 30.0 - 36.0 g/dL   RDW 57.813.4 46.911.5 - 62.915.5 %   Platelets 190.0 150.0 - 400.0 K/uL   Neutrophils Relative % 44.1 43.0 - 77.0 %   Lymphocytes Relative 43.5 12.0 - 46.0 %   Monocytes Relative 8.8 3.0 - 12.0 %   Eosinophils Relative 2.9 0.0 - 5.0 %   Basophils Relative 0.7 0.0 - 3.0 %   Neutro Abs 1.4 1.4 - 7.7 K/uL   Lymphs Abs 1.4 0.7 - 4.0 K/uL   Monocytes Absolute 0.3 0.1 - 1.0 K/uL   Eosinophils Absolute 0.1 0.0 - 0.7 K/uL   Basophils Absolute 0.0 0.0 - 0.1 K/uL  LDL Cholesterol, Direct  Result Value Ref Range   Direct LDL 187.0 mg/dL   Lab Results  Component Value Date   CREATININE 0.88 08/05/2016       Assessment & Plan:   Problem List Items Addressed This Visit    Advanced care planning/counseling discussion    Advanced directives in chart (02/2013): has living will at home. HCPOA is daughter Deeann Creeracey Otoole Tidwell [phone] (cell).       Allergic conjunctivitis and rhinitis   Asthma, moderate persistent    Followed by allergist with regular injections, on singulair.       Chronic cough    ?GERD related. Sees allergist.       Compression fracture of T12 vertebra (HCC)    Continues back brace.       Diabetes type 2, controlled (HCC)    Chronic, stable. Continue current regimen.       GERD (gastroesophageal reflux disease)    With HH. Managed with BID PPI + zantac.       Health maintenance examination - Primary    Preventative protocols reviewed and updated unless pt declined. Discussed  healthy diet and lifestyle.  Hyperlipidemia    Chronic, remains uncontrolled. H/o statin intolerance (lipitor, crestor). Does not want to try another statin. Pt states no investigational trials available at this time. Consider zetia vs welchol. Strong fmhx HLD.  The 10-year ASCVD risk score Denman George DC Montez Hageman., et al., 2013) is: 47.8%   Values used to calculate the score:     Age: 66 years     Sex: Female     Is Non-Hispanic African American: No     Diabetic: Yes     Tobacco smoker: No     Systolic Blood Pressure: 144 mmHg     Is BP treated: Yes     HDL Cholesterol: 46.2 mg/dL     Total Cholesterol: 246 mg/dL       Hypertension    Chronic, mildly elevated but adequate.       Hypothyroidism    Chronic, TFTs stable.       Osteoporosis with fracture    Calcitonin started 06/2014. Pt aware of need to stop. Discussed other options - prolia vs IV bisphosphonate. We previously found out prolia cost $65/53mo. Pt requests handout to review. Avoid oral bisphosphonates due to GERD hx. If declines prolia, would consider evista vs monitoring off therapy with rpt DEXA 1-2 yrs.  rec start vit D 1000 IU daily.       Relevant Medications   Cholecalciferol (VITAMIN D3) 1000 units CAPS   Prolapse of female pelvic organs    Uterine prolapse as well as rectocele and cystocele per GYN - has been referred to urogyn for further evaluation.  RCRI = 1. Reviewed CXR from 06/2015 - stable chronic RAD. Should be adequately low risk to proceed with surgery.           Follow up plan: No Follow-up on file.  Eustaquio Boyden, MD

## 2017-02-18 NOTE — Patient Instructions (Addendum)
Stop calcitonin.  Start vitamin D 1000 units per day over the counter Consider starting prolia. Let us know if yo'ud like to start this and we will schedule you.  Return in 6 months for follow up visit.  Good to see you today, call us with questions.  Denosumab injection What is this medicine? DENOSUMAB (den oh sue mab) slows bone breakdown. Prolia is used to treat osteoporosis in women after menopause and in men. Delton See is used to treat a high calcium level due to cancer and to prevent bone fractures and other bone problems caused by multiple myeloma or cancer bone metastases. Delton See is also used to treat giant cell tumor of the bone. This medicine may be used for other purposes; ask your health care provider or pharmacist if you have questions. COMMON BRAND NAME(S): Prolia, XGEVA What should I tell my health care provider before I take this medicine? They need to know if you have any of these conditions: -dental disease -having surgery or tooth extraction -infection -kidney disease -low levels of calcium or Vitamin D in the blood -malnutrition -on hemodialysis -skin conditions or sensitivity -thyroid or parathyroid disease -an unusual reaction to denosumab, other medicines, foods, dyes, or preservatives -pregnant or trying to get pregnant -breast-feeding How should I use this medicine? This medicine is for injection under the skin. It is given by a health care professional in a hospital or clinic setting. If you are getting Prolia, a special MedGuide will be given to you by the pharmacist with each prescription and refill. Be sure to read this information carefully each time. For Prolia, talk to your pediatrician regarding the use of this medicine in children. Special care may be needed. For Delton See, talk to your pediatrician regarding the use of this medicine in children. While this drug may be prescribed for children as young as 13 years for selected conditions, precautions do  apply. Overdosage: If you think you have taken too much of this medicine contact a poison control center or emergency room at once. NOTE: This medicine is only for you. Do not share this medicine with others. What if I miss a dose? It is important not to miss your dose. Call your doctor or health care professional if you are unable to keep an appointment. What may interact with this medicine? Do not take this medicine with any of the following medications: -other medicines containing denosumab This medicine may also interact with the following medications: -medicines that lower your chance of fighting infection -steroid medicines like prednisone or cortisone This list may not describe all possible interactions. Give your health care provider a list of all the medicines, herbs, non-prescription drugs, or dietary supplements you use. Also tell them if you smoke, drink alcohol, or use illegal drugs. Some items may interact with your medicine. What should I watch for while using this medicine? Visit your doctor or health care professional for regular checks on your progress. Your doctor or health care professional may order blood tests and other tests to see how you are doing. Call your doctor or health care professional for advice if you get a fever, chills or sore throat, or other symptoms of a cold or flu. Do not treat yourself. This drug may decrease your body's ability to fight infection. Try to avoid being around people who are sick. You should make sure you get enough calcium and vitamin D while you are taking this medicine, unless your doctor tells you not to. Discuss the foods you eat  and the vitamins you take with your health care professional. See your dentist regularly. Brush and floss your teeth as directed. Before you have any dental work done, tell your dentist you are receiving this medicine. Do not become pregnant while taking this medicine or for 5 months after stopping it. Talk with  your doctor or health care professional about your birth control options while taking this medicine. Women should inform their doctor if they wish to become pregnant or think they might be pregnant. There is a potential for serious side effects to an unborn child. Talk to your health care professional or pharmacist for more information. What side effects may I notice from receiving this medicine? Side effects that you should report to your doctor or health care professional as soon as possible: -allergic reactions like skin rash, itching or hives, swelling of the face, lips, or tongue -bone pain -breathing problems -dizziness -jaw pain, especially after dental work -redness, blistering, peeling of the skin -signs and symptoms of infection like fever or chills; cough; sore throat; pain or trouble passing urine -signs of low calcium like fast heartbeat, muscle cramps or muscle pain; pain, tingling, numbness in the hands or feet; seizures -unusual bleeding or bruising -unusually weak or tired Side effects that usually do not require medical attention (report to your doctor or health care professional if they continue or are bothersome): -constipation -diarrhea -headache -joint pain -loss of appetite -muscle pain -runny nose -tiredness -upset stomach This list may not describe all possible side effects. Call your doctor for medical advice about side effects. You may report side effects to FDA at 1-800-FDA-1088. Where should I keep my medicine? This medicine is only given in a clinic, doctor's office, or other health care setting and will not be stored at home. NOTE: This sheet is a summary. It may not cover all possible information. If you have questions about this medicine, talk to your doctor, pharmacist, or health care provider.  2018 Elsevier/Gold Standard (2016-03-02 19:17:21)

## 2017-02-19 DIAGNOSIS — N819 Female genital prolapse, unspecified: Secondary | ICD-10-CM | POA: Insufficient documentation

## 2017-02-19 NOTE — Assessment & Plan Note (Signed)
Chronic, stable. Continue current regimen. 

## 2017-02-19 NOTE — Assessment & Plan Note (Addendum)
Advanced directives in chart (02/2013): has living will at home. HCPOA is daughter Jordan Patterson [phone] (cell).

## 2017-02-19 NOTE — Assessment & Plan Note (Signed)
Chronic, TFTs stable.  

## 2017-02-19 NOTE — Assessment & Plan Note (Signed)
Continues back brace.

## 2017-02-19 NOTE — Assessment & Plan Note (Addendum)
[facility] Managed with BID PPI + zantac.

## 2017-02-19 NOTE — Assessment & Plan Note (Addendum)
Followed by allergist with regular injections, on singulair.

## 2017-02-19 NOTE — Assessment & Plan Note (Signed)
Chronic, mildly elevated but adequate.

## 2017-02-19 NOTE — Assessment & Plan Note (Signed)
?  GERD related. Sees allergist.

## 2017-02-19 NOTE — Assessment & Plan Note (Signed)
Uterine prolapse as well as rectocele and cystocele per GYN - has been referred to urogyn for further evaluation.  RCRI = 1. Reviewed CXR from 06/2015 - stable chronic RAD. Should be adequately low risk to proceed with surgery.

## 2017-02-19 NOTE — Assessment & Plan Note (Signed)
Preventative protocols reviewed and updated unless pt declined. Discussed healthy diet and lifestyle.  

## 2017-02-19 NOTE — Assessment & Plan Note (Addendum)
Chronic, remains uncontrolled. H/o statin intolerance (lipitor, crestor). Does not want to try another statin. Pt states no investigational trials available at this time. Consider zetia vs welchol. Strong fmhx HLD.  The 10-year ASCVD risk score Denman George(Goff DC Montez HagemanJr., et al., 2013) is: 47.8%   Values used to calculate the score:     Age: [age] years     Sex: Female     Is Non-Hispanic African American: No     Diabetic: Yes     Tobacco smoker: No     Systolic Blood Pressure: 144 mmHg     Is BP treated: Yes     HDL Cholesterol: 46.2 mg/dL     Total Cholesterol: 246 mg/dL

## 2017-02-19 NOTE — Assessment & Plan Note (Addendum)
Calcitonin started 06/2014. Pt aware of need to stop. Discussed other options - prolia vs IV bisphosphonate. We previously found out prolia cost $65/7165mo. Pt requests handout to review. Avoid oral bisphosphonates due to GERD hx. If declines prolia, would consider evista vs monitoring off therapy with rpt DEXA 1-2 yrs.  rec start vit D 1000 IU daily.

## 2017-02-25 ENCOUNTER — Other Ambulatory Visit: Payer: Self-pay | Admitting: Family Medicine

## 2017-02-28 ENCOUNTER — Encounter: Payer: Self-pay | Admitting: Family Medicine

## 2017-02-28 ENCOUNTER — Other Ambulatory Visit: Payer: Self-pay | Admitting: Family Medicine

## 2017-02-28 NOTE — Telephone Encounter (Signed)
[facility] left v/m cking on status of refills.Pt had annual on 02/18/17; refilled omeprazole 40 mg,losartain K 50 mg and metformin HCL 500 mg per protocol. Need warnings overridden before can refill.Please advise.

## 2017-03-01 NOTE — Addendum Note (Signed)
Addended by: Eustaquio BoydenGUTIERREZ, Daylin Eads on: 03/01/2017 11:26 PM   Modules accepted: Orders

## 2017-03-02 ENCOUNTER — Encounter: Payer: Self-pay | Admitting: Family Medicine

## 2017-03-04 ENCOUNTER — Encounter: Payer: Self-pay | Admitting: Family Medicine

## 2017-03-08 ENCOUNTER — Other Ambulatory Visit: Payer: Self-pay | Admitting: Family Medicine

## 2017-03-21 ENCOUNTER — Encounter: Payer: Self-pay | Admitting: Family Medicine

## 2017-03-28 ENCOUNTER — Encounter: Payer: Self-pay | Admitting: Family Medicine

## 2017-03-29 NOTE — Telephone Encounter (Signed)
Billing question. [place].

## 2017-03-31 ENCOUNTER — Encounter: Payer: Self-pay | Admitting: Gastroenterology

## 2017-03-31 ENCOUNTER — Ambulatory Visit: Payer: Medicare Other | Admitting: Gastroenterology

## 2017-03-31 VITALS — BP 116/68 | HR 88 | Ht 67.5 in | Wt 176.4 lb

## 2017-03-31 DIAGNOSIS — R059 Cough, unspecified: Secondary | ICD-10-CM

## 2017-03-31 DIAGNOSIS — K219 Gastro-esophageal reflux disease without esophagitis: Secondary | ICD-10-CM | POA: Diagnosis not present

## 2017-03-31 DIAGNOSIS — R05 Cough: Secondary | ICD-10-CM

## 2017-03-31 MED ORDER — DEXLANSOPRAZOLE 60 MG PO CPDR: 60.0000 mg | DELAYED_RELEASE_CAPSULE | Freq: Every day | ORAL | 3 refills | Status: DC

## 2017-03-31 NOTE — Patient Instructions (Addendum)
You have been scheduled for an esophageal manometry at [facility] on 04/11/2017 at 10:30am. Please arrive 30 minutes prior to your procedure for registration. You will need to go to outpatient registration (1st floor of the hospital) first. Make certain to bring your insurance cards as well as a complete list of medications.  Please remember the following:  Stay on your omeprazole and zantac    1) Do not take any muscle relaxants, xanax (alprazolam) or ativan for 1 day prior to your     test as well as the day of the test.  2) Nothing to eat or drink after 12:00 midnight on the night before your test.  3) Hold all diabetic medications/insulin the morning of the test. You may eat and take             your medications after the test.  It will take at least 2 weeks to receive the results of this test from your physician. ------------------------------------------ ABOUT ESOPHAGEAL MANOMETRY Esophageal manometry (muh-NOM-uh-tree) is a test that gauges how well your esophagus works. Your esophagus is the long, muscular tube that connects your throat to your stomach. Esophageal manometry measures the rhythmic muscle contractions (peristalsis) that occur in your esophagus when you swallow. Esophageal manometry also measures the coordination and force exerted by the muscles of your esophagus.  During esophageal manometry, a thin, flexible tube (catheter) that contains sensors is passed through your nose, down your esophagus and into your stomach. Esophageal manometry can be helpful in diagnosing some mostly uncommon disorders that affect your esophagus.  Why it's done Esophageal manometry is used to evaluate the movement (motility) of food through the esophagus and into the stomach. The test measures how well the circular bands of muscle (sphincters) at the top and bottom of your esophagus open and close, as well as the pressure, strength and pattern of the wave of esophageal muscle contractions  that moves food along.  What you can expect Esophageal manometry is an outpatient procedure done without sedation. Most people tolerate it well. You may be asked to change into a hospital gown before the test starts.  During esophageal manometry  While you are sitting up, a member of your health care team sprays your throat with a numbing medication or puts numbing gel in your nose or both.  A catheter is guided through your nose into your esophagus. The catheter may be sheathed in a water-filled sleeve. It doesn't interfere with your breathing. However, your eyes may water, and you may gag. You may have a slight nosebleed from irritation.  After the catheter is in place, you may be asked to lie on your back on an exam table, or you may be asked to remain seated.  You then swallow small sips of water. As you do, a computer connected to the catheter records the pressure, strength and pattern of your esophageal muscle contractions.  During the test, you'll be asked to breathe slowly and smoothly, remain as still as possible, and swallow only when you're asked to do so.  A member of your health care team may move the catheter down into your stomach while the catheter continues its measurements.  The catheter then is slowly withdrawn. The test usually lasts 20 to 30 minutes.  After esophageal manometry  When your esophageal manometry is complete, you may return to your normal activities  This test typically takes 30-45 minutes to complete. _________________________________________________   We will send Dexilant to your pharmacy  Take Zantac  300 mg at bedtime  Use Over the counter Gaviscon after meals as needed   Gastroesophageal Reflux Disease, Adult Normally, food travels down the esophagus and stays in the stomach to be digested. However, when a person has gastroesophageal reflux disease (GERD), food and stomach acid move back up into the esophagus. When this happens, the esophagus becomes  sore and inflamed. Over time, GERD can create small holes (ulcers) in the lining of the esophagus. What are the causes? This condition is caused by a problem with the muscle between the esophagus and the stomach (lower esophageal sphincter, or LES). Normally, the LES muscle closes after food passes through the esophagus to the stomach. When the LES is weakened or abnormal, it does not close properly, and that allows food and stomach acid to go back up into the esophagus. The LES can be weakened by certain dietary substances, medicines, and medical conditions, including:  Tobacco use.  Pregnancy.  Having a hiatal hernia.  Heavy alcohol use.  Certain foods and beverages, such as coffee, chocolate, onions, and peppermint.  What increases the risk? This condition is more likely to develop in:  People who have an increased body weight.  People who have connective tissue disorders.  People who use NSAID medicines.  What are the signs or symptoms? Symptoms of this condition include:  Heartburn.  Difficult or painful swallowing.  The feeling of having a lump in the throat.  Abitter taste in the mouth.  Bad breath.  Having a large amount of saliva.  Having an upset or bloated stomach.  Belching.  Chest pain.  Shortness of breath or wheezing.  Ongoing (chronic) cough or a night-time cough.  Wearing away of tooth enamel.  Weight loss.  Different conditions can cause chest pain. Make sure to see your health care provider if you experience chest pain. How is this diagnosed? Your health care provider will take a medical history and perform a physical exam. To determine if you have mild or severe GERD, your health care provider may also monitor how you respond to treatment. You may also have other tests, including:  An endoscopy toexamine your stomach and esophagus with a small camera.  A test thatmeasures the acidity level in your esophagus.  A test thatmeasures how  much pressure is on your esophagus.  A barium swallow or modified barium swallow to show the shape, size, and functioning of your esophagus.  How is this treated? The goal of treatment is to help relieve your symptoms and to prevent complications. Treatment for this condition may vary depending on how severe your symptoms are. Your health care provider may recommend:  Changes to your diet.  Medicine.  Surgery.  Follow these instructions at home: Diet  Follow a diet as recommended by your health care provider. This may involve avoiding foods and drinks such as: ? Coffee and tea (with or without caffeine). ? Drinks that containalcohol. ? Energy drinks and sports drinks. ? Carbonated drinks or sodas. ? Chocolate and cocoa. ? Peppermint and mint flavorings. ? Garlic and onions. ? Horseradish. ? Spicy and acidic foods, including peppers, chili powder, curry powder, vinegar, hot sauces, and barbecue sauce. ? Citrus fruit juices and citrus fruits, such as oranges, lemons, and limes. ? Tomato-based foods, such as red sauce, chili, salsa, and pizza with red sauce. ? Fried and fatty foods, such as donuts, french fries, potato chips, and high-fat dressings. ? High-fat meats, such as hot dogs and fatty cuts of red and white meats,  such as rib eye steak, sausage, ham, and bacon. ? High-fat dairy items, such as whole milk, butter, and cream cheese.  Eat small, frequent meals instead of large meals.  Avoid drinking large amounts of liquid with your meals.  Avoid eating meals during the 2-3 hours before bedtime.  Avoid lying down right after you eat.  Do not exercise right after you eat. General instructions  Pay attention to any changes in your symptoms.  Take over-the-counter and prescription medicines only as told by your health care provider. Do not take aspirin, ibuprofen, or other NSAIDs unless your health care provider told you to do so.  Do not use any tobacco products,  including cigarettes, chewing tobacco, and e-cigarettes. If you need help quitting, ask your health care provider.  Wear loose-fitting clothing. Do not wear anything tight around your waist that causes pressure on your abdomen.  Raise (elevate) the head of your bed 6 inches (15cm).  Try to reduce your stress, such as with yoga or meditation. If you need help reducing stress, ask your health care provider.  If you are overweight, reduce your weight to an amount that is healthy for you. Ask your health care provider for guidance about a safe weight loss goal.  Keep all follow-up visits as told by your health care provider. This is important. Contact a health care provider if:  You have new symptoms.  You have unexplained weight loss.  You have difficulty swallowing, or it hurts to swallow.  You have wheezing or a persistent cough.  Your symptoms do not improve with treatment.  You have a hoarse voice. Get help right away if:  You have pain in your arms, neck, jaw, teeth, or back.  You feel sweaty, dizzy, or light-headed.  You have chest pain or shortness of breath.  You vomit and your vomit looks like blood or coffee grounds.  You faint.  Your stool is bloody or black.  You cannot swallow, drink, or eat. This information is not intended to replace advice given to you by your health care provider. Make sure you discuss any questions you have with your health care provider. Document Released: 11/18/2004 Document Revised: 07/09/2015 Document Reviewed: 06/05/2014 Elsevier Interactive Patient Education  [employer]. _______________________________

## 2017-03-31 NOTE — Progress Notes (Signed)
Jordan HorsemanJean C Patterson    [id number]    1941-02-05  Primary Care Physician:Gutierrez, Wynona CanesJavier, MD  Referring Physician: Eustaquio BoydenGutierrez, Javier, MD [address] [place], [place] [id number]  Chief complaint:  GERD, regurgitation  HPI: 77 yr F here for follow up visit. She was last seen in March 2018. She has been on Omeprazole 40mg  BID for a long period.  She continues to have breakthrough heartburn despite taking Omeprazole 40mg  twice daily. She occasionally takes additional 20mg  of Omeprazole at bedtime when its really bad. She regurgitates whenever she bends. She has chronic intermittent cough. She feels Omeprazole is not improving her symptoms. Denies any nausea, vomiting, abdominal pain, melena or bright red blood per rectum No dysphagia or odynophagia.   Outpatient Encounter Medications as of 03/31/2017  Medication Sig  . aspirin 81 MG tablet Take 81 mg by mouth daily.  . Cholecalciferol (VITAMIN D3) 1000 units CAPS Take 1 capsule (1,000 Units total) by mouth daily.  . Coenzyme Q10 (COQ10) 100 MG CAPS Take 100 mg by mouth daily.   . diphenhydramine-acetaminophen (TYLENOL PM) 25-500 MG TABS tablet Take 1 tablet by mouth at bedtime as needed.  . Garlic (GARLIQUE PO) Take 1 tablet by mouth daily.  . Glucosamine-Chondroitin (OSTEO BI-FLEX REGULAR STRENGTH PO) Take 2 tablets by mouth daily with supper.  Marland Kitchen. glucose blood test strip ONE TOUCH ULTRA MINI Use to check sugar daily. Dx:E11.9  . losartan (COZAAR) 50 MG tablet TAKE ONE AND ONE-HALF (1.5) TABLET BY MOUTH DAILY  . metFORMIN (GLUCOPHAGE) 500 MG tablet TAKE 1 TABLET BY MOUTH TWICE A DAY WITH A MEAL.  . montelukast (SINGULAIR) 10 MG tablet TAKE ONE TABLET BY MOUTH EVERY NIGHT AT BEDTIME  . Multiple Vitamins-Minerals (EYE VITAMINS) CAPS Take 1 capsule by mouth daily.  . NONFORMULARY OR COMPOUNDED ITEM Allergy Vaccine  Given at [facility]  . omeprazole (PRILOSEC) 40 MG capsule TAKE ONE (1) CAPSULE BY MOUTH 2 TIMES DAILY  .  polyethylene glycol (MIRALAX / GLYCOLAX) packet Take 17 g by mouth daily as needed for moderate constipation.  . Polyvinyl Alcohol-Povidone (REFRESH OP) Place 1 drop into both eyes daily.   Marland Kitchen. PROAIR HFA 108 (90 Base) MCG/ACT inhaler INHALE TWO PUFFS INTO THE LUNGS EVERY 6 HOURS AS NEEDED FOR WHEEZING OR SHORTNESS OF BREATH.  . ranitidine (ZANTAC) 300 MG tablet Take 1 tablet (300 mg total) by mouth at bedtime.  . sodium chloride (BRONCHO SALINE) inhaler solution Take 1 spray by nebulization as needed.  Marland Kitchen. SYNTHROID 100 MCG tablet TAKE 1 TABLET BY MOUTH DAILY WITH BREAKFAST.  Marland Kitchen. triamterene-hydrochlorothiazide (DYAZIDE) 37.5-25 MG capsule TAKE ONE CAPSULE BY MOUTH EVERY MORNING   No facility-administered encounter medications on file as of 03/31/2017.     Allergies as of 03/31/2017 - Review Complete 03/31/2017  Allergen Reaction Noted  . Ace inhibitors Cough 05/19/2011  . Amlodipine Cough 05/19/2011  . Crestor [rosuvastatin] Other (See Comments) 01/24/2014  . Pregabalin Other (See Comments) 05/19/2011  . Tegretol [carbamazepine] Other (See Comments) 05/19/2011  . Wellbutrin [bupropion hcl] Other (See Comments) 05/19/2011  . Cymbalta [duloxetine hcl] Palpitations and Other (See Comments) 05/19/2011  . Lipitor [atorvastatin] Cough 03/26/2016    Past Medical History:  Diagnosis Date  . Allergic rhinitis   . Arthritis    hands and knees - ?osteo  . Asthma   . Chronic headaches   . Compression fracture of T12 vertebra (HCC) 07/18/2014   S/p kyphoplasty by Dr Newell CoralNudelman (  08/2014)   . Depression    pt denies  . Diabetes type 2, controlled (HCC) 2004  . Environmental allergies    dust,mold,mildew  . Extrinsic asthma   . GERD (gastroesophageal reflux disease)    [name] EGD - mild esophageal dysmotility and small hiatal hernia  . Hiatal hernia   . Hx of migraines   . Hyperlipidemia    mild, diet controlled  . Hypertension   . Hypothyroidism   . Macular degeneration   . Osteoporosis with  fracture 07/2014   T -1.2 hip, T 0.2 spine, T12 compression fracture s/p kyphoplasty    Past Surgical History:  Procedure Laterality Date  . CATARACT EXTRACTION  2003   bilateral with lens implant  . COLONOSCOPY    . DEXA  05/2008   improved (initial DEXA 2007 - mild osteopenia)  . DILATION AND CURETTAGE OF UTERUS  1978  . ESOPHAGOGASTRODUODENOSCOPY  06/25/2011   Procedure: ESOPHAGOGASTRODUODENOSCOPY (EGD);  Surgeon: Hart Carwin, MD;  Location: [facility];  Service: Endoscopy;  Laterality: N/A;  no Xray  . KYPHOPLASTY N/A 08/30/2014   Procedure: THORACIC TWELVE KYPHOPLASTY;  Surgeon: [name], MD  . LAPAROSCOPY ABDOMEN DIAGNOSTIC     To R/O endometriosis  . SAVORY DILATION  06/25/2011   Procedure: SAVORY DILATION;  Surgeon: Hart Carwin, MD;  Location: [facility];  Service: Endoscopy;  Laterality: N/A;  . TONSILLECTOMY AND ADENOIDECTOMY  1962  . US ECHOCARDIOGRAPHY  10/2014   nl systolic function EF 55%, mild diastolic dysfunction    Family History  Problem Relation Age of Onset  . Mitral valve prolapse Father   . Diabetes Father   . Mitral valve prolapse Mother   . Hypertension Mother   . Stroke Mother 10       after valve surgery  . Parkinson's disease Brother   . Other Brother        POST WAR TRAUMA  . Stroke Paternal Grandfather   . Heart failure Maternal Grandfather   . Cancer Neg Hx     Social History   Socioeconomic History  . Marital status: Widowed    Spouse name: Not on file  . Number of children: 3  . Years of education: Not on file  . Highest education level: Not on file  Social Needs  . Financial resource strain: Not on file  . Food insecurity - worry: Not on file  . Food insecurity - inability: Not on file  . Transportation needs - medical: Not on file  . Transportation needs - non-medical: Not on file  Occupational History  . Occupation: Retired  Tobacco Use  . Smoking status: Never Smoker  . Smokeless tobacco: Never Used  Substance and  Sexual Activity  . Alcohol use: No    Alcohol/week: 0.0 oz  . Drug use: No  . Sexual activity: No  Other Topics Concern  . 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  HENT: Negative.   Eyes: Negative for blurred vision.  Respiratory: Negative for cough, shortness of breath and wheezing.   Cardiovascular: Negative for  chest pain and palpitations.  Gastrointestinal: as per HPI Genitourinary: Negative for dysuria, urgency, frequency and hematuria.  Musculoskeletal: Negative for myalgias, back pain and joint pain.  Skin: Negative for itching and rash.  Neurological: Negative for dizziness, tremors, focal weakness, seizures and loss of consciousness.  Endo/Heme/Allergies: Positive for seasonal allergies.  Psychiatric/Behavioral: Negative for depression, suicidal ideas and hallucinations.  All other systems reviewed and are negative.   Physical Exam: Vitals:   03/31/17 1058  BP: 116/68  Pulse: 88   Body mass index is 27.22 kg/m. Gen:      No acute distress HEENT:  EOMI, sclera anicteric Neck:     No masses; no thyromegaly Lungs:    Clear to auscultation bilaterally; normal respiratory effort CV:         Regular rate and rhythm; no murmurs Abd:      + bowel sounds; soft, non-tender; no palpable masses, no distension Ext:    No edema; adequate peripheral perfusion Skin:      Warm and dry; no rash Neuro: alert and oriented x 3 Psych: normal mood and affect  Data Reviewed:  Reviewed labs, radiology imaging, old records and pertinent past GI work up   Assessment and Plan/Recommendations: 77 year old  female with history of chronic GERD and cough here with complaints of persistent breakthrough heartburn, regurgitation, hoarseness despite taking high-dose PPI twice daily Will switch to Dexilant 60 mg daily Continue ranitidine 300 mg at bedtime to control nocturnal symptoms Okay to use Gaviscon as needed after meals for breakthrough symptoms Will obtain esophageal manometry and 24-hour pH impedance on PPI to evaluate esophageal motility and see if patient is continuing to have increased acid or nonacid reflux despite medication If no evidence of esophageal dysmotility and has increased reflux, will need to consider Nissen fundoplication due to failed medical therapy and persistent symptoms Discussed antireflux measures in detail   25 minutes was spent face-to-face with the patient. Greater than 50% of the time used for counseling as well as treatment plan and follow-up. She had multiple questions which were answered to her satisfaction  K. Scherry Ran , MD [phone] Mon-Fri 8a-5p [phone] after 5p, weekends, holidays  CC: Eustaquio Boyden, MD

## 2017-04-02 ENCOUNTER — Encounter: Payer: Self-pay | Admitting: Gastroenterology

## 2017-04-04 ENCOUNTER — Other Ambulatory Visit: Payer: Self-pay

## 2017-04-04 MED ORDER — RANITIDINE HCL 300 MG PO TABS: 300.0000 mg | ORAL_TABLET | Freq: Every day | ORAL | 6 refills | Status: DC

## 2017-04-04 MED ORDER — ESOMEPRAZOLE MAGNESIUM 40 MG PO CPDR: 40.0000 mg | DELAYED_RELEASE_CAPSULE | Freq: Every day | ORAL | 6 refills | Status: DC

## 2017-04-11 ENCOUNTER — Ambulatory Visit (HOSPITAL_COMMUNITY)
Admission: RE | Admit: 2017-04-11 | Discharge: 2017-04-11 | Disposition: A | Discharge status: Home / Self Care | Payer: Medicare Other | Source: Ambulatory Visit | Attending: Gastroenterology | Admitting: Gastroenterology

## 2017-04-11 ENCOUNTER — Encounter (HOSPITAL_COMMUNITY)
Admission: RE | Disposition: A | Discharge status: Home / Self Care | Payer: Self-pay | Source: Ambulatory Visit | Attending: Gastroenterology

## 2017-04-11 ENCOUNTER — Telehealth: Payer: Self-pay | Admitting: Gastroenterology

## 2017-04-11 DIAGNOSIS — R1310 Dysphagia, unspecified: Secondary | ICD-10-CM | POA: Diagnosis present

## 2017-04-11 DIAGNOSIS — R058 Other specified cough: Secondary | ICD-10-CM

## 2017-04-11 DIAGNOSIS — K219 Gastro-esophageal reflux disease without esophagitis: Secondary | ICD-10-CM | POA: Diagnosis not present

## 2017-04-11 DIAGNOSIS — R05 Cough: Secondary | ICD-10-CM | POA: Insufficient documentation

## 2017-04-11 DIAGNOSIS — R1110 Vomiting, unspecified: Secondary | ICD-10-CM

## 2017-04-11 HISTORY — PX: ESOPHAGEAL MANOMETRY: SHX5429

## 2017-04-11 HISTORY — PX: PH IMPEDANCE STUDY: SHX5565

## 2017-04-11 HISTORY — PX: 24 HOUR PH STUDY: SHX5419

## 2017-04-11 SURGERY — MANOMETRY, ESOPHAGUS

## 2017-04-11 MED ORDER — LIDOCAINE VISCOUS 2 % MT SOLN
OROMUCOSAL | Status: AC
  Filled 2017-04-11: qty 15

## 2017-04-11 SURGICAL SUPPLY — 2 items
FACESHIELD LNG OPTICON STERILE (SAFETY) IMPLANT
GLOVE BIO SURGEON STRL SZ8 (GLOVE) ×4 IMPLANT

## 2017-04-11 NOTE — Progress Notes (Addendum)
Esophageal manometry done per protocol.  Patient tolerated well.  PH probe placed in left nare at 36 cm.  Patient tolerated well.  Patient remains on PPI and H2 blocker. Verbalized understanding and use of the monitor.  She will return tomorrow  To [facility] tomorrow at 1155.  Both reports to be sent to Dr. Lavon PaganiniNandigam on 04/12/17.

## 2017-04-11 NOTE — Telephone Encounter (Signed)
Patient did not remember she was to have a 24 hour PH study as well as the esophageal manometry. Confirmed this for the patient.

## 2017-04-12 ENCOUNTER — Ambulatory Visit: Payer: Medicare Other | Admitting: Internal Medicine

## 2017-04-12 ENCOUNTER — Encounter: Payer: Self-pay | Admitting: Internal Medicine

## 2017-04-12 DIAGNOSIS — J4540 Moderate persistent asthma, uncomplicated: Secondary | ICD-10-CM

## 2017-04-12 DIAGNOSIS — R05 Cough: Secondary | ICD-10-CM | POA: Diagnosis not present

## 2017-04-12 DIAGNOSIS — R053 Chronic cough: Secondary | ICD-10-CM

## 2017-04-12 MED ORDER — FLUTICASONE-UMECLIDIN-VILANT 100-62.5-25 MCG/INH IN AEPB: 1.0000 | INHALATION_SPRAY | Freq: Every day | RESPIRATORY_TRACT | 0 refills | Status: DC

## 2017-04-12 NOTE — Progress Notes (Signed)
HPI F never smoker followed for allergic rhinitis, asthma, complicated by GERD, hypothyroid, DM 2 FENO 12/29/15- 37-elevated, indicating allergic component to airway irritation. Office Spirometry 12/29/2015-moderately severe obstructive airways disease-FVC 1.99/60%, FEV1 1.29/51%, ratio 0.65, FEF 25-75 0.73/39%. -------------------------------------------------------------------------------------------- 12/29/2015-77 year old female never smoker followed for Allergic rhinitis, Asthma, complicated by GERD, hypothyroid, DM 2 Allergy Vaccine 1:10 GH FOLLOWS FOR: Pt still on allergy vaccine and denies any concerns. Pt is having wheezing, SOB at times, coiugh-non productive and slight chest congestion. Elwin Sleight works well but is expensive so she is getting it by mail order. Fewer nasal symptoms now but more wheeze and cough with recent weather change. She believes she needs her allergy shots weekly, not every other week, during pollen season. CXR 07/16/2015 IMPRESSION: Hyperinflation consistent with reactive airway disease, stable. There is no pneumonia nor other acute cardiopulmonary abnormality. FENO 12/29/15- 37-elevated, indicating allergic component to airway irritation. Office Spirometry 12/29/2015-moderately severe obstructive airways disease-FVC 1.99/60%, FEV1 1.29/51%, ratio 0.65, FEF 25-75 0.73/39%.  04/12/17- 77 year old female never smoker followed for Allergic rhinitis, Asthma, complicated by GERD, hypothyroid, DM 2 ----asthma, cough, acid reflux  Here with her daughter.  She has a nasal probe in for reflux assessment related to her chronic cough. Basic pattern of her cough has not changed in years.  Sometimes hard to clear phlegm but usually dry.  Minimal wheeze.  No glaucoma.  She likes Fisherman's Friend cough drops. She says she got no benefit from benzonatate Perles, various inhalers which made her cough, gabapentin, cough syrups, tramadol.  ROS-see HPI    += positive Constitutional:    No-   weight loss, night sweats, fevers, chills, fatigue, lassitude. HEENT:   No-  headaches, difficulty swallowing, tooth/dental problems, sore throat,       +sneezing, itching, ear ache, nasal congestion, post nasal drip,  CV:  No-   chest pain, orthopnea, PND, swelling in lower extremities, anasarca,                                                          dizziness, palpitations Resp: No-   shortness of breath with exertion or at rest.              No-   productive cough,  + non-productive cough,  No- coughing up of blood.              No-   change in color of mucus.+ wheezing.   Skin: No-   rash or lesions. GI:  +  heartburn, indigestion, No-abdominal pain, nausea, vomiting, GU:  MS:  No-   joint pain or swelling.   Neuro-     nothing unusual Psych:  No- change in mood or affect. No depression or anxiety.  No memory loss.  OBJ- Physical Exam General- Alert, Oriented, Affect-appropriate, Distress- none acute Skin- rash-none, lesions- none, excoriation- none Lymphadenopathy- none Head- atraumatic            Eyes- Gross vision intact, PERRLA, conjunctivae and secretions clear            Ears- clear            Nose- Clear, no-Septal dev, mucus, polyps, erosion, perforation             Throat- Mallampati II , mucosa clear , drainage+white, tonsils- atrophic Neck- flexible , trachea  midline, no stridor , thyroid nl, carotid no bruit Chest - symmetrical excursion , unlabored           Heart/CV- RRR , no murmur , no gallop  , no rub, nl s1 s2                           - JVD- none , edema- none, stasis changes- none, varices- none           Lung- clear to P&A, wheeze- none, cough -none , dullness-none, rub- none           Chest wall- + back brace Abd-  Br/ Gen/ Rectal- Not done, not indicated Extrem- cyanosis- none, clubbing, none, atrophy- none, strength- nl Neuro- grossly intact to observation

## 2017-04-12 NOTE — Patient Instructions (Signed)
Sample Trelegy inhaler     Inhale 1 puff, then rinse mouth, once daily  Please call if we can help

## 2017-04-13 NOTE — Assessment & Plan Note (Signed)
Usual cough etiologies have been explored.  We are trying 1 more inhaler-Trelegy.  She is being evaluated now for reflux.  We discussed upper airway cough syndrome.

## 2017-04-13 NOTE — Assessment & Plan Note (Signed)
If there is a reactive airways component to her cough, she might get some benefit from Trelegy Plan-samples of Trelegy

## 2017-04-14 ENCOUNTER — Encounter (HOSPITAL_COMMUNITY): Payer: Self-pay | Admitting: Gastroenterology

## 2017-04-20 ENCOUNTER — Telehealth: Payer: Self-pay | Admitting: Internal Medicine

## 2017-04-20 DIAGNOSIS — R1110 Vomiting, unspecified: Secondary | ICD-10-CM

## 2017-04-20 NOTE — Telephone Encounter (Signed)
Spoke with pt. States that has some questions about Trelegy. I have answered her questions to the best of my ability. Advised her that I would not know how much the medication would cost, we would have to send a prescription to her pharmacy before we could find that out. Nothing further was needed.

## 2017-04-24 NOTE — Telephone Encounter (Addendum)
Ok thanks 

## 2017-04-25 ENCOUNTER — Other Ambulatory Visit: Payer: Self-pay

## 2017-04-25 ENCOUNTER — Encounter: Payer: Self-pay | Admitting: Internal Medicine

## 2017-04-25 MED ORDER — FLUTICASONE-UMECLIDIN-VILANT 100-62.5-25 MCG/INH IN AEPB: 1.0000 | INHALATION_SPRAY | Freq: Every day | RESPIRATORY_TRACT | 5 refills | Status: DC

## 2017-04-27 ENCOUNTER — Encounter: Payer: Self-pay | Admitting: Internal Medicine

## 2017-04-27 ENCOUNTER — Telehealth: Payer: Self-pay | Admitting: Gastroenterology

## 2017-04-27 NOTE — Telephone Encounter (Signed)
Called patient and discussed results. She will continue the current medications, would like to taper down PPI dose in the future. Please schedule follow up visit in 6-10 weeks. Thanks

## 2017-04-27 NOTE — Telephone Encounter (Signed)
Would you call her to schedule her appointment please?

## 2017-04-27 NOTE — Telephone Encounter (Signed)
Please clarify for me about what the study shows. Is her cough due to her reflux?

## 2017-04-28 NOTE — Telephone Encounter (Signed)
Please make her an appointment with Dr Sherene SiresWert for chronic cough. Tell her that he works a lot with chronic cough problems. I don't know of anybody who knows more about chronic cough than he does.

## 2017-04-28 NOTE — Telephone Encounter (Signed)
See pt's email: Pt is requesting to see a cough specialist due to her worsening cough.  Pt last seen on 2.19.19 by CY.   Dr. Maple HudsonYoung please advise on recs. Thanks.

## 2017-05-02 NOTE — Telephone Encounter (Signed)
Left message on voicemail for patient to call back and schedule follow up.

## 2017-05-04 ENCOUNTER — Other Ambulatory Visit: Payer: Self-pay | Admitting: Internal Medicine

## 2017-05-24 ENCOUNTER — Encounter: Payer: Self-pay | Admitting: Internal Medicine

## 2017-05-24 MED ORDER — ALBUTEROL SULFATE HFA 108 (90 BASE) MCG/ACT IN AERS: 2.0000 | INHALATION_SPRAY | Freq: Four times a day (QID) | RESPIRATORY_TRACT | 3 refills | Status: DC | PRN

## 2017-05-31 ENCOUNTER — Encounter: Payer: Self-pay | Admitting: Internal Medicine

## 2017-05-31 ENCOUNTER — Ambulatory Visit: Payer: Medicare Other | Admitting: Internal Medicine

## 2017-05-31 VITALS — BP 128/70 | HR 89 | Ht 67.0 in | Wt 179.6 lb

## 2017-05-31 DIAGNOSIS — R058 Other specified cough: Secondary | ICD-10-CM

## 2017-05-31 DIAGNOSIS — J4540 Moderate persistent asthma, uncomplicated: Secondary | ICD-10-CM

## 2017-05-31 DIAGNOSIS — R05 Cough: Secondary | ICD-10-CM

## 2017-05-31 LAB — NITRIC OXIDE: Nitric Oxide: 29

## 2017-05-31 MED ORDER — BUDESONIDE-FORMOTEROL FUMARATE 80-4.5 MCG/ACT IN AERO: 2.0000 | INHALATION_SPRAY | Freq: Two times a day (BID) | RESPIRATORY_TRACT | 12 refills | Status: DC

## 2017-05-31 MED ORDER — BUDESONIDE-FORMOTEROL FUMARATE 80-4.5 MCG/ACT IN AERO: 2.0000 | INHALATION_SPRAY | Freq: Two times a day (BID) | RESPIRATORY_TRACT | 0 refills | Status: DC

## 2017-05-31 NOTE — Patient Instructions (Addendum)
GERD (REFLUX)  is an extremely common cause of respiratory symptoms just like yours , many times with no obvious heartburn at all.    It can be treated with medication, but also with lifestyle changes including elevation of the head of your bed (ideally with 6 inch  bed blocks),  Smoking cessation, avoidance of late meals, excessive alcohol, and avoid fatty foods, chocolate, peppermint, colas, red wine, and acidic juices such as orange juice.  NO MINT OR MENTHOL PRODUCTS SO NO COUGH DROPS   USE SUGARLESS CANDY INSTEAD (Jolley ranchers or Stover's or Life Savers) or even ice chips will also do - the key is to swallow to prevent all throat clearing. NO OIL BASED VITAMINS - use powdered substitutes.  Continue nexium 40 mg Take 30-60 min before first meal of the day and zantac 300 mg after supper   Stop trelegy   Start symbicort 80 Take 2 puffs first thing in am and then another 2 puffs about 12 hours later.   Work on inhaler technique:  relax and gently blow all the way out then take a nice smooth deep breath back in, triggering the inhaler at same time you start breathing in.  Hold for up to 5 seconds if you can. Blow out thru nose. Rinse and gargle with water when done      For drainage / throat tickle try take CHLORPHENIRAMINE  4 mg - take one every 4 hours as needed - available over the counter- may cause drowsiness so start with just a bedtime dose or two and see how you tolerate it before trying in daytime    Please schedule a follow up office visit in 4 weeks, sooner if needed  with all medications /inhalers/ solutions in hand so we can verify exactly what you are taking. This includes all medications from all doctors and over the counters

## 2017-05-31 NOTE — Progress Notes (Signed)
Subjective:     Patient ID: Jordan Patterson, female   DOB: [birth year]      MRN: [id number]  HPI  [id number] yowf  Never smoker with poor ex tol for as long as she can remember dx as asthma age 77 treated with prn inhalers for sob not cough but avg activity including up and down steps and yardwork s difficulty with indolent onset around 2014 daily cough> sob  that worsened over time and so referred to pulmonary clinic 05/31/2017 by Dr   Maple HudsonYoung after failed rx for allergic rhinitis and ? asthma vs gerd related coughing.   05/31/2017 1st   office visit/ Jordan Patterson   Chief Complaint  Patient presents with  . Pulmonary Consult    Second opinion on cough per Dr Maple HudsonYoung. She has been coughing for years. It mainly bothers her during the day. She states she will occ produce some clear to yellow sputum.  She states talking and bending over are triggers for her cough.   best when asleep / ? Some better with trelegy > daughter says not  Notes say better on dulera but could not afford it, she does not recall details   Kouffman Reflux v Neurogenic Cough Differentiator Reflux Comments  Do you awaken from a sound sleep coughing violently?                            With trouble breathing? occ obvious related to HB Can't breath or speak    Do you have choking episodes when you cannot  Get enough air, gasping for air ?              Yes   Do you usually cough when you lie down into  The bed, or when you just lie down to rest ?                          Yes   Do you usually cough after meals or eating?         Yes   Do you cough when (or after) you bend over?    Yes   GERD SCORE     Kouffman Reflux v Neurogenic Cough Differentiator Neurogenic   Do you more-or-less cough all day long? sporadic   Does change of temperature make you cough? Yes/cold air   Does laughing or chuckling cause you to cough? maybe   Do fumes (perfume, automobile fumes, burned  Toast, etc.,) cause you to cough ?      no   Does speaking, singing, or talking  on the phone cause you to cough   ?               yes   Neurogenic/Airway score      Really Not limited by breathing from desired activities    No obvious day to day or daytime variability or assoc excess/ purulent sputum or mucus plugs or hemoptysis or cp or chest tightness, subjective wheeze or overt sinus or hb symptoms. No unusual exposure hx or h/o childhood pna/ asthma or knowledge of premature birth.  Sleeping  Ok unless having obvious GERD issues propped up on 2 pillows  without nocturnal  or early am exacerbation  of respiratory  c/o's or need for noct saba. Also denies any obvious fluctuation of symptoms with weather or environmental changes or other aggravating or alleviating factors except as outlined above  Current Allergies, Complete Past Medical History, Past Surgical History, Family History, and Social History were reviewed in [employer] record.  ROS  The following are not active complaints unless bolded Hoarseness, sore throat, dysphagia, dental problems, itching, sneezing,  nasal congestion or discharge of excess mucus or purulent secretions, ear ache,   fever, chills, sweats, unintended wt loss or wt gain, classically pleuritic or exertional cp,  orthopnea pnd or arm/hand swelling  or leg swelling, presyncope, palpitations, abdominal pain, anorexia, nausea, vomiting, diarrhea  or change in bowel habits or change in bladder habits, change in stools or change in urine, dysuria, hematuria,  rash, arthralgias, visual complaints, headache, numbness, weakness or ataxia or problems with walking or coordination,  change in mood or  memory.        Current Meds  Medication Sig  . albuterol (PROAIR HFA) 108 (90 Base) MCG/ACT inhaler Inhale 2 puffs into the lungs every 6 (six) hours as needed for wheezing or shortness of breath.  Marland Kitchen aspirin 81 MG tablet Take 81 mg by mouth daily.  . Cholecalciferol (VITAMIN D3) 1000 units CAPS Take 1 capsule (1,000 Units total) by  mouth daily.  . Coenzyme Q10 (COQ10) 100 MG CAPS Take 100 mg by mouth daily.   Marland Kitchen esomeprazole (NEXIUM) 40 MG capsule Take 1 capsule (40 mg total) by mouth daily before breakfast.  . Garlic (GARLIQUE PO) Take 1 tablet by mouth daily.  . Glucosamine-Chondroitin (OSTEO BI-FLEX REGULAR STRENGTH PO) Take 2 tablets by mouth daily with supper.  . losartan (COZAAR) 50 MG tablet TAKE ONE AND ONE-HALF (1.5) TABLET BY MOUTH DAILY  . metFORMIN (GLUCOPHAGE) 500 MG tablet TAKE 1 TABLET BY MOUTH TWICE A DAY WITH A MEAL.  . montelukast (SINGULAIR) 10 MG tablet TAKE ONE TABLET BY MOUTH EVERY NIGHT AT BEDTIME  . NONFORMULARY OR COMPOUNDED ITEM Allergy Vaccine  Given at [facility]  . Polyvinyl Alcohol-Povidone (REFRESH OP) Place 1 drop into both eyes daily.   Marland Kitchen PROAIR HFA 108 (90 Base) MCG/ACT inhaler INHALE TWO PUFFS INTO THE LUNGS EVERY 6 HOURS AS NEEDED FOR WHEEZING OR SHORTNESS OF BREATH.  . ranitidine (ZANTAC) 300 MG tablet Take 1 tablet (300 mg total) by mouth at bedtime.  . Saline (SIMPLY SALINE) 0.9 % AERS Place into the nose as needed.  Marland Kitchen SYNTHROID 100 MCG tablet TAKE 1 TABLET BY MOUTH DAILY WITH BREAKFAST.  Marland Kitchen triamterene-hydrochlorothiazide (DYAZIDE) 37.5-25 MG capsule TAKE ONE CAPSULE BY MOUTH EVERY MORNING  . [  diphenhydramine-acetaminophen (TYLENOL PM) 25-500 MG TABS tablet Take 1 tablet by mouth at bedtime as needed.  .   Fluticasone-Umeclidin-Vilant (TRELEGY ELLIPTA) 100-62.5-25 MCG/INH AEPB Inhale 1 puff into the lungs daily.        Review of Systems     Objective:   Physical Exam    amb wf nad/ dry raspy quality cough   Wt Readings from Last 3 Encounters:  05/31/17 179 lb 9.6 oz (81.5 kg)  04/12/17 175 lb 6.4 oz (79.6 kg)  03/31/17 176 lb 6.4 oz (80 kg)     Vital signs reviewed - Note on arrival 02 sats  97% on RA  HEENT: nl dentition, turbinates bilaterally, and oropharynx. Nl external ear canals without cough reflex   NECK :  without JVD/Nodes/TM/ nl carotid upstrokes  bilaterally   LUNGS: no acc muscle use,  Nl contour chest which is clear to A and P bilaterally with  cough on insp and exp maneuvers   CV:  RRR  no s3  or murmur or increase in P2, and no edema   ABD:  soft and nontender with nl inspiratory excursion in the supine position. No bruits or organomegaly appreciated, bowel sounds nl  MS:  Nl gait/ ext warm without deformities, calf tenderness, cyanosis or clubbing No obvious joint restrictions   SKIN: warm and dry without lesions    NEURO:  alert, approp, nl sensorium with  no motor or cerebellar deficits apparent.        Imaging review: none since 07/16/15    Assessment:

## 2017-06-01 ENCOUNTER — Encounter: Payer: Self-pay | Admitting: Internal Medicine

## 2017-06-01 NOTE — Assessment & Plan Note (Addendum)
-   see manometry/ ph studies 04/11/17  With good symptom correlation but rare events   Upper airway cough syndrome (previously labeled PNDS),  is so named because it's frequently impossible to sort out how much is  CR/sinusitis with freq throat clearing (which can be related to primary GERD)   vs  causing  secondary (" extra esophageal")  GERD from wide swings in gastric pressure that occur with throat clearing, often  promoting self use of mint and menthol lozenges that reduce the lower esophageal sphincter tone and exacerbate the problem further in a cyclical fashion.   These are the same pts (now being labeled as having "irritable larynx syndrome" by some cough centers) who not infrequently have a history of having failed to tolerate ace inhibitors,  dry powder inhalers or biphosphonates or report having atypical/extraesophageal reflux symptoms that don't respond to standard doses of PPI  and are easily confused as having aecopd or asthma flares by even experienced allergists/ pulmonologists (myself included).    Try off dpi, max rx for gerd/ 1st gen H1 blockers per guidelines / review with Dr Gasper LloydNandigan ? Whether NF needs to be reconsidered here.

## 2017-06-01 NOTE — Assessment & Plan Note (Signed)
Spirometry 01/23/08 >> FEV1 1.83 (69%), FEV1% 82 FENO 12/29/15- 37-elevated, indicating allergic component to airway irritation. Office Spirometry 12/29/2015-moderately severe obstructive airways disease-FVC 1.99/60%, FEV1 1.29/51%, ratio 0.65, FEF 25-75 0.73/39%. - FENO 05/31/2017  =   29  On trelegy  - 05/31/2017  After extensive coaching inhaler device  effectiveness =    75% rx  Sym 80  2bid   Not clear her sympotms are asthma vs uacs (see spe a/p ) but if they are asthma related and she also proves to have uacs, a likely scenario, should theoretically do better with low dose hfa ics =  dulera 100 or symb 80 2bid   F/u in 4 weeks   I had an extended discussion with the patient reviewing all relevant studies completed to date and  lasting 25 minutes of a 40  minute office visit with pt new to me    re  severe non-specific but potentially very serious refractory respiratory symptoms of uncertain and potentially multiple  etiologies.   Each maintenance medication was reviewed in detail including most importantly the difference between maintenance and prns and under what circumstances the prns are to be triggered using an action plan format that is not reflected in the computer generated alphabetically organized AVS.    Please see AVS for specific instructions unique to this office visit that I personally wrote and verbalized to the the pt in detail and then reviewed with pt  by my nurse highlighting any changes in therapy/plan of care  recommended at today's visit.

## 2017-06-07 ENCOUNTER — Encounter: Payer: Self-pay | Admitting: Internal Medicine

## 2017-06-16 ENCOUNTER — Encounter: Payer: Self-pay | Admitting: Internal Medicine

## 2017-06-17 MED ORDER — BUDESONIDE-FORMOTEROL FUMARATE 80-4.5 MCG/ACT IN AERO: 2.0000 | INHALATION_SPRAY | Freq: Two times a day (BID) | RESPIRATORY_TRACT | 11 refills | Status: DC

## 2017-06-28 ENCOUNTER — Ambulatory Visit: Payer: Medicare Other | Admitting: Internal Medicine

## 2017-06-28 ENCOUNTER — Encounter: Payer: Self-pay | Admitting: Internal Medicine

## 2017-06-28 VITALS — BP 138/88 | HR 91 | Ht 67.0 in | Wt 179.4 lb

## 2017-06-28 DIAGNOSIS — R05 Cough: Secondary | ICD-10-CM

## 2017-06-28 DIAGNOSIS — R058 Other specified cough: Secondary | ICD-10-CM

## 2017-06-28 DIAGNOSIS — J4540 Moderate persistent asthma, uncomplicated: Secondary | ICD-10-CM

## 2017-06-28 NOTE — Progress Notes (Signed)
Subjective:     Patient ID: Jordan Patterson, female   DOB: 1940-03-02      MRN: [id number]    Brief patient profile:  77 yowf  Never smoker with poor ex tol for as long as she can remember dx as asthma age 77 treated with prn inhalers for sob not cough but avg activity including up and down steps and yardwork s difficulty with indolent onset around 2014 daily cough> sob  that worsened over time and so referred to pulmonary clinic 05/31/2017 by Dr   Maple Hudson after failed rx for allergic rhinitis and ? asthma vs gerd related coughing.     History of Present Illness  05/31/2017 1st   office visit/ Jordan Patterson  Cough x 5 years  Chief Complaint  Patient presents with  . Pulmonary Consult    Second opinion on cough per Dr Maple Hudson. She has been coughing for years. It mainly bothers her during the day. She states she will occ produce some clear to yellow sputum.  She states talking and bending over are triggers for her cough.   best when asleep / ? Some better with trelegy > daughter says not  Notes say better on dulera but could not afford it, she does not recall details   Kouffman Reflux v Neurogenic Cough Differentiator Reflux Comments  Do you awaken from a sound sleep coughing violently?                            With trouble breathing? occ obvious related to HB Can't breath or speak    Do you have choking episodes when you cannot  Get enough air, gasping for air ?              Yes   Do you usually cough when you lie down into  The bed, or when you just lie down to rest ?                          Yes   Do you usually cough after meals or eating?         Yes   Do you cough when (or after) you bend over?    Yes   GERD SCORE     Kouffman Reflux v Neurogenic Cough Differentiator Neurogenic   Do you more-or-less cough all day long? sporadic   Does change of temperature make you cough? Yes/cold air   Does laughing or chuckling cause you to cough? maybe   Do fumes (perfume, automobile fumes, burned  Toast,  etc.,) cause you to cough ?      no   Does speaking, singing, or talking on the phone cause you to cough   ?               yes   Neurogenic/Airway score      Really Not limited by breathing from desired activities   rec GERD   Continue nexium 40 mg Take 30-60 min before first meal of the day and zantac 300 mg after supper  Stop trelegy  Start symbicort 80 Take 2 puffs first thing in am and then another 2 puffs about 12 hours later.  Work on inhaler technique:  relax and gently blow all the way out then take a nice smooth deep breath back in, triggering the inhaler at same time you start breathing in.  Hold for up to 5 seconds if  you can. Blow out thru nose. Rinse and gargle with water when done For drainage / throat tickle try take CHLORPHENIRAMINE  4 mg - take one every 4 hours Please schedule a follow up office visit in 4 weeks, sooner if needed  with all medications /inhalers/ solutions in hand so we can verify exactly what you are taking. This includes all medications from all doctors and over the counters     06/28/2017  f/u ov/Jordan Patterson re: cough x 2014 Chief Complaint  Patient presents with  . Follow-up    reports her non-productive cough better with occassional clear phlegm.   Dyspnea:  MMRC2 = can't walk a nl pace on a flat grade s sob but does fine slow and flat  Cough: much better/ min mucoid  Sleep: better despite h1 just in am  SABA use:  None   No obvious day to day or daytime variability or assoc excess/ purulent sputum or mucus plugs or hemoptysis or cp or chest tightness, subjective wheeze or overt sinus or hb symptoms. No unusual exposure hx or h/o childhood pna/ asthma or knowledge of premature birth.  Sleeping ok  without nocturnal  or early am exacerbation  of respiratory  c/o's or need for noct saba. Also denies any obvious fluctuation of symptoms with weather or environmental changes or other aggravating or alleviating factors except as outlined above   Current  Allergies, Complete Past Medical History, Past Surgical History, Family History, and Social History were reviewed in [employer] record.  ROS  The following are not active complaints unless bolded Hoarseness, sore throat, dysphagia, dental problems, itching, sneezing,  nasal congestion or discharge of excess mucus or purulent secretions, ear ache,   fever, chills, sweats, unintended wt loss or wt gain, classically pleuritic or exertional cp,  orthopnea pnd or arm/hand swelling  or leg swelling, presyncope, palpitations, abdominal pain, anorexia, nausea, vomiting, diarrhea  or change in bowel habits or change in bladder habits, change in stools or change in urine, dysuria, hematuria,  rash, arthralgias, visual complaints, headache, numbness, weakness or ataxia or problems with walking or coordination,  change in mood or  memory.        Current Meds  Medication Sig  . albuterol (PROAIR HFA) 108 (90 Base) MCG/ACT inhaler Inhale 2 puffs into the lungs every 6 (six) hours as needed for wheezing or shortness of breath.  . Aloe Vera 25 MG CAPS Take 25 mg by mouth daily.  Marland Kitchen aspirin 81 MG tablet Take 81 mg by mouth daily.  . Biotin 5000 MCG TABS Take 5,000 mcg by mouth daily.  . budesonide-formoterol (SYMBICORT) 80-4.5 MCG/ACT inhaler Inhale 2 puffs into the lungs 2 (two) times daily.  . calcium carbonate (OSCAL) 1500 (600 Ca) MG TABS tablet Take 600 mg by mouth daily.  . chlorpheniramine (CHLOR-TRIMETON) 4 MG tablet Take 4 mg by mouth 2 (two) times daily as needed for allergies.  . Cholecalciferol (VITAMIN D3) 1000 units CAPS Take 1 capsule (1,000 Units total) by mouth daily.  . Coenzyme Q10 (COQ10) 100 MG CAPS Take 100 mg by mouth daily.   . Cranberry 500 MG TABS Take 500 mg by mouth daily.  Marland Kitchen DHA-Vitamin C-Lutein (EYE HEALTH FORMULA PO) Take by mouth daily.  . diphenhydramine-acetaminophen (ACETAMINOPHEN PM) 25-500 MG TABS tablet Take 1 tablet by mouth at bedtime as needed.  Marland Kitchen  esomeprazole (NEXIUM) 40 MG capsule Take 1 capsule (40 mg total) by mouth daily before breakfast.  . Garlic (GARLIQUE PO) Take  1 tablet by mouth daily.  . Glucosamine-Chondroitin (OSTEO BI-FLEX REGULAR STRENGTH PO) Take 2 tablets by mouth daily with supper.  . losartan (COZAAR) 50 MG tablet TAKE ONE AND ONE-HALF (1.5) TABLET BY MOUTH DAILY  . metFORMIN (GLUCOPHAGE) 500 MG tablet TAKE 1 TABLET BY MOUTH TWICE A DAY WITH A MEAL.  . montelukast (SINGULAIR) 10 MG tablet TAKE ONE TABLET BY MOUTH EVERY NIGHT AT BEDTIME  . NONFORMULARY OR COMPOUNDED ITEM Allergy Vaccine  Given at [facility]  . polyethylene glycol (MIRALAX / GLYCOLAX) packet Take 17 g by mouth daily.  . Polyvinyl Alcohol-Povidone (REFRESH OP) Place 1 drop into both eyes daily.   Marland Kitchen PROAIR HFA 108 (90 Base) MCG/ACT inhaler INHALE TWO PUFFS INTO THE LUNGS EVERY 6 HOURS AS NEEDED FOR WHEEZING OR SHORTNESS OF BREATH.  . ranitidine (ZANTAC) 300 MG tablet Take 1 tablet (300 mg total) by mouth at bedtime.  . Saline (SIMPLY SALINE) 0.9 % AERS Place into the nose as needed.  Marland Kitchen Specialty Vitamins Products (CARDIOVASCULAR SUPPORT PO) Take by mouth daily.  Marland Kitchen SYNTHROID 100 MCG tablet TAKE 1 TABLET BY MOUTH DAILY WITH BREAKFAST.  Marland Kitchen triamterene-hydrochlorothiazide (DYAZIDE) 37.5-25 MG capsule TAKE ONE CAPSULE BY MOUTH EVERY MORNING  .               Objective:   Physical Exam  amb wf nad   06/28/2017          179   05/31/17 179 lb 9.6 oz (81.5 kg)  04/12/17 175 lb 6.4 oz (79.6 kg)  03/31/17 176 lb 6.4 oz (80 kg)     Vital signs reviewed - Note on arrival 02 sats  97% on RA    HEENT: nl dentition, turbinates bilaterally, and oropharynx. Nl external ear canals without cough reflex   NECK :  without JVD/Nodes/TM/ nl carotid upstrokes bilaterally   LUNGS: no acc muscle use,  Nl contour chest which is clear to A and P bilaterally without cough now on insp or exp maneuvers   CV:  RRR  no s3 or murmur or increase in P2, and no edema    ABD:  soft and nontender with nl inspiratory excursion in the supine position. No bruits or organomegaly appreciated, bowel sounds nl  MS:  Nl gait/ ext warm without deformities, calf tenderness, cyanosis or clubbing No obvious joint restrictions   SKIN: warm and dry without lesions    NEURO:  alert, approp, nl sensorium with  no motor or cerebellar deficits apparent.             Assessment:

## 2017-06-28 NOTE — Patient Instructions (Addendum)
For drainage / throat tickle try take CHLORPHENIRAMINE  4 mg - take one every 4 hours as needed - available over the counter- may cause drowsiness so start with just an eventing  dose or two and see how you tolerate it before trying in daytime    Stop all the oil based vitamins you can   Calcium carbonate should be replaced by same amount  of calcium gluconate   Please schedule a follow up office visit in 6 weeks, call sooner if needed  - needs cxr on return

## 2017-06-29 ENCOUNTER — Encounter: Payer: Self-pay | Admitting: Internal Medicine

## 2017-06-29 NOTE — Assessment & Plan Note (Addendum)
-   see manometry/ ph studies 04/11/17  With good symptom correlation but rare events> not enough to warrant NF per Dr Gasper Lloyd    Clearly this is Upper airway cough syndrome (previously labeled PNDS),  is so named because it's frequently impossible to sort out how much is  CR/sinusitis with freq throat clearing (which can be related to primary GERD)   vs  causing  secondary (" extra esophageal")  GERD from wide swings in gastric pressure that occur with throat clearing, often  promoting self use of mint and menthol lozenges that reduce the lower esophageal sphincter tone and exacerbate the problem further in a cyclical fashion.   These are the same pts (now being labeled as having "irritable larynx syndrome" by some cough centers) who not infrequently have a history of having failed to tolerate ace inhibitors,  dry powder inhalers (note off trelegy in favor of lowest dose hfa ICS/laba may have helped here) or biphosphonates or report having atypical/extraesophageal reflux symptoms that don't respond to standard doses of PPI  and are easily confused as having aecopd or asthma flares by even experienced allergists/ pulmonologists (myself included).   rec more aggressive rx with 1st gen H1 blockers per guidelines  And continued max rx for diet/ acid suppression

## 2017-06-29 NOTE — Assessment & Plan Note (Addendum)
Spirometry 01/23/08 >> FEV1 1.83 (69%), FEV1% 82 FENO 12/29/15- 37-elevated, indicating allergic component to airway irritation. Office Spirometry 12/29/2015-moderately severe obstructive airways disease-FVC 1.99/60%, FEV1 1.29/51%, ratio 0.65, FEF 25-75 0.73/39%. - FENO 05/31/2017  =   29  On trelegy - 05/31/2017    rx  dulera 100  06/28/2017  After extensive coaching inhaler device  effectiveness =    75%   All goals of chronic asthma control met including optimal function and elimination of symptoms with minimal need for rescue therapy.  Contingencies discussed in full including contacting this office immediately if not controlling the symptoms using the rule of two's.     See device teaching which extended face to face time for this visit to > 50% of 25 min ov   Each maintenance medication was reviewed in detail including most importantly the difference between maintenance and as needed and under what circumstances the prns are to be used.  Please see AVS for specific  Instructions which are unique to this visit and I personally typed out  which were reviewed in detail in writing with the patient and a copy provided.

## 2017-07-13 ENCOUNTER — Encounter: Payer: Self-pay | Admitting: Gastroenterology

## 2017-07-13 ENCOUNTER — Ambulatory Visit: Payer: Medicare Other | Admitting: Gastroenterology

## 2017-07-13 VITALS — BP 140/86 | HR 96 | Ht 66.75 in | Wt 179.4 lb

## 2017-07-13 DIAGNOSIS — R1110 Vomiting, unspecified: Secondary | ICD-10-CM | POA: Diagnosis not present

## 2017-07-13 DIAGNOSIS — K219 Gastro-esophageal reflux disease without esophagitis: Secondary | ICD-10-CM

## 2017-07-13 DIAGNOSIS — R059 Cough, unspecified: Secondary | ICD-10-CM

## 2017-07-13 DIAGNOSIS — K449 Diaphragmatic hernia without obstruction or gangrene: Secondary | ICD-10-CM | POA: Diagnosis not present

## 2017-07-13 DIAGNOSIS — R05 Cough: Secondary | ICD-10-CM | POA: Diagnosis not present

## 2017-07-13 MED ORDER — BACLOFEN 10 MG PO TABS: 10.0000 mg | ORAL_TABLET | Freq: Every day | ORAL | 3 refills | Status: DC

## 2017-07-13 NOTE — Patient Instructions (Addendum)
We will refer you to [facility] to see Dr Daphine Deutscher and will contact you with that appointment  We will send Baclofen to your pharmacy   Follow up in 3 months  If you are age 77 or older, your body mass index should be between 23-30. Your Body mass index is 28.3 kg/m. If this is out of the aforementioned range listed, please consider follow up with your Primary Care Provider.  If you are age 52 or younger, your body mass index should be between 19-25. Your Body mass index is 28.3 kg/m. If this is out of the aformentioned range listed, please consider follow up with your Primary Care Provider.

## 2017-07-13 NOTE — Progress Notes (Signed)
KALIE CABRAL    [id number]    1940/05/18  Primary Care Physician:Gutierrez, Wynona Canes, MD  Referring Physician: Eustaquio Boyden, MD [address] [place], [place] [address]  Chief complaint: GERD, cough  HPI:  77 year old female with history of chronic GERD, regurgitation and cough here for follow-up visit.  She is on PPI 40 mg twice daily and Zantac 300 mg at bedtime.  Her predominant complaint is regurgitation and cough.  Whenever she bends over she feels fluid, to back of her throat.  She wears abdominal binder/girdle for back support after back injury.  She also feels regurgitation to the back of her throat when she lays down.  She has chronic intermittent cough.  Feels cough is somewhat better since she started following up with Dr. Sherene Sires Denies any dysphagia, odynophagia, nausea, vomiting, abdominal pain, change in bowel habits, melena or blood per rectum.  EGD Jun 25, 2011: Irregular Z line with biopsies negative for intestinal metaplasia or Barrett's esophagus.  Status post dilation with Maloney to 17 mm.  Otherwise normal exam  Upper GI series November 2012: Small sliding-type hiatal hernia with moderate inducible gastroesophageal reflux.  Mild smooth narrowing at the GE junction likely a lower esophageal mucosal ring.  30 mm barium pill passed into stomach without any difficulty.  Normal appearance of stomach and duodenum.  Esophageal manometry February 2019: Normal  24-hour pH impedance on PPI and H2 blocker February 2019: Good acid suppression with DeMeester score 7.2, very few gastroesophageal reflux events , were predominantly weekly acid reflux.  Good symptom correlation for regurgitation with reflux events but no correlation for cough.   Outpatient Encounter Medications as of 07/13/2017  Medication Sig  . albuterol (PROAIR HFA) 108 (90 Base) MCG/ACT inhaler Inhale 2 puffs into the lungs every 6 (six) hours as needed for wheezing or shortness of breath.  .  Aloe Vera 25 MG CAPS Take 25 mg by mouth daily.  Marland Kitchen aspirin 81 MG tablet Take 81 mg by mouth daily.  . Biotin 5000 MCG TABS Take 5,000 mcg by mouth daily.  . budesonide-formoterol (SYMBICORT) 80-4.5 MCG/ACT inhaler Inhale 2 puffs into the lungs 2 (two) times daily.  . calcium carbonate (OSCAL) 1500 (600 Ca) MG TABS tablet Take 600 mg by mouth daily.  . chlorpheniramine (CHLOR-TRIMETON) 4 MG tablet Take 4 mg by mouth 2 (two) times daily as needed for allergies.  . Cholecalciferol (VITAMIN D3) 1000 units CAPS Take 1 capsule (1,000 Units total) by mouth daily.  . Coenzyme Q10 (COQ10) 100 MG CAPS Take 100 mg by mouth daily.   . Cranberry 500 MG TABS Take 500 mg by mouth daily.  Marland Kitchen DHA-Vitamin C-Lutein (EYE HEALTH FORMULA PO) Take by mouth daily.  . diphenhydramine-acetaminophen (ACETAMINOPHEN PM) 25-500 MG TABS tablet Take 1 tablet by mouth at bedtime as needed.  Marland Kitchen esomeprazole (NEXIUM) 40 MG capsule Take 1 capsule (40 mg total) by mouth daily before breakfast.  . Garlic (GARLIQUE PO) Take 1 tablet by mouth daily.  . Glucosamine-Chondroitin (OSTEO BI-FLEX REGULAR STRENGTH PO) Take 2 tablets by mouth daily with supper.  . losartan (COZAAR) 50 MG tablet TAKE ONE AND ONE-HALF (1.5) TABLET BY MOUTH DAILY  . metFORMIN (GLUCOPHAGE) 500 MG tablet TAKE 1 TABLET BY MOUTH TWICE A DAY WITH A MEAL.  . montelukast (SINGULAIR) 10 MG tablet TAKE ONE TABLET BY MOUTH EVERY NIGHT AT BEDTIME  . NONFORMULARY OR COMPOUNDED ITEM Allergy Vaccine  Given at [facility]  .  polyethylene glycol (MIRALAX / GLYCOLAX) packet Take 17 g by mouth daily.  . Polyvinyl Alcohol-Povidone (REFRESH OP) Place 1 drop into both eyes daily.   Marland Kitchen PROAIR HFA 108 (90 Base) MCG/ACT inhaler INHALE TWO PUFFS INTO THE LUNGS EVERY 6 HOURS AS NEEDED FOR WHEEZING OR SHORTNESS OF BREATH.  . ranitidine (ZANTAC) 300 MG tablet Take 1 tablet (300 mg total) by mouth at bedtime.  . Saline (SIMPLY SALINE) 0.9 % AERS Place into the nose as needed.  Marland Kitchen Specialty  Vitamins Products (CARDIOVASCULAR SUPPORT PO) Take by mouth daily.  Marland Kitchen SYNTHROID 100 MCG tablet TAKE 1 TABLET BY MOUTH DAILY WITH BREAKFAST.  Marland Kitchen triamterene-hydrochlorothiazide (DYAZIDE) 37.5-25 MG capsule TAKE ONE CAPSULE BY MOUTH EVERY MORNING   No facility-administered encounter medications on file as of 07/13/2017.     Allergies as of 07/13/2017 - Review Complete 07/13/2017  Allergen Reaction Noted  . Ace inhibitors Cough 05/19/2011  . Amlodipine Cough 05/19/2011  . Crestor [rosuvastatin] Other (See Comments) 01/24/2014  . Pregabalin Other (See Comments) 05/19/2011  . Tegretol [carbamazepine] Other (See Comments) 05/19/2011  . Wellbutrin [bupropion hcl] Other (See Comments) 05/19/2011  . Cymbalta [duloxetine hcl] Palpitations and Other (See Comments) 05/19/2011  . Lipitor [atorvastatin] Cough 03/26/2016    Past Medical History:  Diagnosis Date  . Allergic rhinitis   . Arthritis    hands and knees - ?osteo  . Asthma   . Chronic headaches   . Compression fracture of T12 vertebra (HCC) 07/18/2014   S/p kyphoplasty by Dr Newell Coral (08/2014)   . Depression    pt denies  . Diabetes type 2, controlled (HCC) 2004  . Environmental allergies    dust,mold,mildew  . Extrinsic asthma   . GERD (gastroesophageal reflux disease)    [name] EGD - mild esophageal dysmotility and small hiatal hernia  . Hiatal hernia   . Hx of migraines   . Hyperlipidemia    mild, diet controlled  . Hypertension   . Hypothyroidism   . Macular degeneration   . Osteoporosis with fracture 07/2014   T -1.2 hip, T 0.2 spine, T12 compression fracture s/p kyphoplasty    Past Surgical History:  Procedure Laterality Date  . 24 HOUR PH STUDY N/A 04/11/2017   Procedure: 24 HOUR PH STUDY;  Surgeon: Napoleon Form, MD;  Location: [facility];  Service: Endoscopy;  Laterality: N/A;  . CATARACT EXTRACTION  2003   bilateral with lens implant  . COLONOSCOPY    . DEXA  05/2008   improved (initial DEXA 2007 - mild  osteopenia)  . DILATION AND CURETTAGE OF UTERUS  1978  . ESOPHAGEAL MANOMETRY N/A 04/11/2017   Procedure: ESOPHAGEAL MANOMETRY (EM);  Surgeon: Napoleon Form, MD;  Location: [facility];  Service: Endoscopy;  Laterality: N/A;  . ESOPHAGOGASTRODUODENOSCOPY  06/25/2011   Procedure: ESOPHAGOGASTRODUODENOSCOPY (EGD);  Surgeon: Hart Carwin, MD;  Location: [facility];  Service: Endoscopy;  Laterality: N/A;  no Xray  . KYPHOPLASTY N/A 08/30/2014   Procedure: THORACIC TWELVE KYPHOPLASTY;  Surgeon: [name], MD  . LAPAROSCOPY ABDOMEN DIAGNOSTIC     To R/O endometriosis  . PH IMPEDANCE STUDY N/A 04/11/2017   Procedure: PH IMPEDANCE STUDY;  Surgeon: Napoleon Form, MD;  Location: [facility];  Service: Endoscopy;  Laterality: N/A;  . SAVORY DILATION  06/25/2011   Procedure: SAVORY DILATION;  Surgeon: Hart Carwin, MD;  Location: [facility];  Service: Endoscopy;  Laterality: N/A;  . TONSILLECTOMY AND ADENOIDECTOMY  1962  . US ECHOCARDIOGRAPHY  10/2014  nl systolic function EF 55%, mild diastolic dysfunction    Family History  Problem Relation Age of Onset  . Mitral valve prolapse Father   . Diabetes Father   . Mitral valve prolapse Mother   . Hypertension Mother   . Stroke Mother 54       after valve surgery  . Parkinson's disease Brother   . Other Brother        POST WAR TRAUMA  . Stroke Paternal Grandfather   . Heart failure Maternal Grandfather   . Cancer Neg Hx   . Colon cancer Neg Hx   . Stomach cancer Neg Hx   . Esophageal cancer Neg Hx   . Pancreatic cancer Neg Hx   . Liver disease Neg Hx     Social History   Socioeconomic History  . Marital status: Widowed    Spouse name: Not on file  . Number of children: 3  . Years of education: Not on file  . Highest education level: Not on file  Occupational History  . Occupation: Retired  Engineer, production  . Financial resource strain: Not on file  . Food insecurity:    Worry: Not on file    Inability: Not on file    . Transportation needs:    Medical: Not on file    Non-medical: Not on file  Tobacco Use  . Smoking status: Never Smoker  . Smokeless tobacco: Never Used  Substance and Sexual Activity  . Alcohol use: No    Alcohol/week: 0.0 oz  . Drug use: No  . Sexual activity: Never  Lifestyle  . Physical activity:    Days per week: Not on file    Minutes per session: Not on file  . Stress: Not on file  Relationships  . Social connections:    Talks on phone: Not on file    Gets together: Not on file    Attends religious service: Not on file    Active member of club or organization: Not on file    Attends meetings of clubs or organizations: Not on file    Relationship status: Not on file  . Intimate partner violence:    Fear of current or ex partner: Not on file    Emotionally abused: Not on file    Physically abused: Not on file    Forced sexual activity: Not on file  Other Topics Concern  . 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  HENT: Positive for difficulty hearing and ringing in ears   Eyes: Negative for blurred vision.  Respiratory: Negative for  shortness of breath and wheezing.  Positive for cough Cardiovascular: Negative for chest pain and palpitations.  Gastrointestinal: as per HPI Genitourinary: Negative for dysuria, urgency, frequency and hematuria.  Musculoskeletal: Negative for myalgias, back pain and joint pain.  Skin: Negative for itching and rash.  Neurological: Negative for dizziness, tremors, focal weakness, seizures and loss of consciousness.  Endo/Heme/Allergies: Positive for seasonal  allergies.  Psychiatric/Behavioral: Negative for depression, suicidal ideas and hallucinations.  All other systems reviewed and are negative.  Physical Exam: Vitals:   07/13/17 1012  BP: 140/86  Pulse: 96   Body mass index is 28.3 kg/m. Gen:      No acute distress HEENT:  EOMI, sclera anicteric Neck:     No masses; no thyromegaly Lungs:    Clear to auscultation bilaterally; normal respiratory effort CV:         Regular rate and rhythm; no murmurs Abd:      + bowel sounds; soft, non-tender; no palpable masses, no distension Ext:    No edema; adequate peripheral perfusion Skin:      Warm and dry; no rash Neuro: alert and oriented x 3 Psych: normal mood and affect  Data Reviewed:  Reviewed labs, radiology imaging, old records and pertinent past GI work up   Assessment and Plan/Recommendations:  77 year old female with history of chronic GERD here with complaint of uncontrolled GERD related symptoms.  Her predominant symptoms are regurgitation and cough.  At this point it is unclear if cough is the cause or effect of uncontrolled GERD, she did have good acid suppression based on 24 hr pH impedance study with only few detectable events of gastroesophageal reflux. We will do a trial of baclofen 10 mg daily at bedtime to reduce transient lower esophageal sphincter relaxation thereby potentially decreased episodes of regurgitation Continue PPI twice daily before breakfast and dinner Continue Zantac at bedtime as needed Continue antireflux measures and lifestyle modification as discussed in detail during this visit Patient has follow-up appointment with Dr. Sherene Sires for management of chronic cough Patient is interested in pursuing antireflux surgery, will refer to Dr. Daphine Deutscher at [facility] for evaluation.  Potential for slipped Nissen or postop complications if she continues to have uncontrolled cough.  Greater than 50% of the time used for counseling as well as treatment plan and follow-up. She  had multiple questions which were answered to her satisfaction  K. Scherry Ran , MD [phone]    CC: Eustaquio Boyden, MD

## 2017-07-14 ENCOUNTER — Encounter: Payer: Self-pay | Admitting: Gastroenterology

## 2017-07-15 ENCOUNTER — Telehealth: Payer: Self-pay | Admitting: *Deleted

## 2017-07-15 NOTE — Telephone Encounter (Signed)
Patient has an appointment with Dr Daphine Deutscher at [facility] on 07/27/2017 at 1100am  Pt aware of the appointment by [facility]

## 2017-07-22 ENCOUNTER — Encounter: Payer: Self-pay | Admitting: Gastroenterology

## 2017-07-25 ENCOUNTER — Telehealth: Payer: Self-pay | Admitting: Gastroenterology

## 2017-07-25 NOTE — Telephone Encounter (Signed)
Patient was scheduled at [facility] with Dr Doylene Canardonner instead of Dr Daphine DeutscherMartin for 6/5  Is this ok   Patient wants to know before she goes to this appointment

## 2017-07-26 NOTE — Telephone Encounter (Signed)
Keep appointment with scheduled provider per Dr Lavon PaganiniNandigam

## 2017-07-26 NOTE — Telephone Encounter (Signed)
Advise patient to keep the appt and discuss the options with Dr Doylene Canardonner. She is new provider and I haven't interacted with her yet.

## 2017-08-17 ENCOUNTER — Encounter: Payer: Self-pay | Admitting: Internal Medicine

## 2017-08-17 ENCOUNTER — Other Ambulatory Visit (INDEPENDENT_AMBULATORY_CARE_PROVIDER_SITE_OTHER): Payer: Medicare Other

## 2017-08-17 ENCOUNTER — Ambulatory Visit (INDEPENDENT_AMBULATORY_CARE_PROVIDER_SITE_OTHER)
Admission: RE | Admit: 2017-08-17 | Discharge: 2017-08-17 | Disposition: A | Discharge status: Home / Self Care | Payer: Medicare Other | Source: Ambulatory Visit | Attending: Internal Medicine | Admitting: Internal Medicine

## 2017-08-17 ENCOUNTER — Ambulatory Visit: Payer: Medicare Other | Admitting: Internal Medicine

## 2017-08-17 VITALS — BP 134/84 | HR 89 | Ht 67.5 in | Wt 177.4 lb

## 2017-08-17 DIAGNOSIS — R058 Other specified cough: Secondary | ICD-10-CM

## 2017-08-17 DIAGNOSIS — J4540 Moderate persistent asthma, uncomplicated: Secondary | ICD-10-CM | POA: Diagnosis not present

## 2017-08-17 DIAGNOSIS — R05 Cough: Secondary | ICD-10-CM

## 2017-08-17 LAB — CBC WITH DIFFERENTIAL/PLATELET
Basophils Absolute: 0.1 10*3/uL (ref 0.0–0.1)
Basophils Relative: 0.9 % (ref 0.0–3.0)
Eosinophils Absolute: 0.2 10*3/uL (ref 0.0–0.7)
Eosinophils Relative: 2.8 % (ref 0.0–5.0)
HCT: 38.7 % (ref 36.0–46.0)
Hemoglobin: 13.1 g/dL (ref 12.0–15.0)
Lymphocytes Relative: 35.9 % (ref 12.0–46.0)
Lymphs Abs: 2.3 10*3/uL (ref 0.7–4.0)
MCHC: 33.7 g/dL (ref 30.0–36.0)
MCV: 90.5 fl (ref 78.0–100.0)
Monocytes Absolute: 0.5 10*3/uL (ref 0.1–1.0)
Monocytes Relative: 7.8 % (ref 3.0–12.0)
Neutro Abs: 3.3 10*3/uL (ref 1.4–7.7)
Neutrophils Relative %: 52.6 % (ref 43.0–77.0)
Platelets: 205 10*3/uL (ref 150.0–400.0)
RBC: 4.28 Mil/uL (ref 3.87–5.11)
RDW: 13.9 % (ref 11.5–15.5)
WBC: 6.3 10*3/uL (ref 4.0–10.5)

## 2017-08-17 LAB — NITRIC OXIDE: Nitric Oxide: 25

## 2017-08-17 MED ORDER — PREDNISONE 10 MG PO TABS: ORAL_TABLET | ORAL | 0 refills | Status: DC

## 2017-08-17 NOTE — Progress Notes (Signed)
Subjective:     Patient ID: Jordan Patterson, female   DOB: 1941/01/31      MRN: [id number]    Brief patient profile:  77 yowf  Never smoker with poor ex tol for as long as she can remember dx as asthma age 77 treated with prn inhalers for sob 77 not cough but avg activity including up and down steps and yardwork s difficulty with indolent onset around 2014 daily cough> sob  that worsened over time and so referred to pulmonary clinic 05/31/2017 by Dr   Maple Hudson after failed rx for allergic rhinitis and ? asthma vs gerd related coughing.  On shots at least 2014  per Dr Nira Retort > can't really tell helped      History of Present Illness  05/31/2017 1st   office visit/ Wert  77Cough x 5 years  Chief Complaint  Patient presents with  . Pulmonary Consult    Second opinion on cough per Dr Maple Hudson. She has been coughing for years. It mainly bothers her during the day. She states she will occ produce some clear to yellow sputum.  She states talking and bending over are triggers for her cough.   best when asleep / ? Some better with trelegy > daughter says not  Notes say better on dulera but could not afford it, she does not recall details   Kouffman Reflux v Neurogenic Cough Differentiator Reflux Comments  Do you awaken from a sound sleep coughing violently?                            With trouble breathing? occ obvious related to HB Can't breath or speak    Do you have choking episodes when you cannot  Get enough air, gasping for air ?              Yes   Do you usually cough when you lie down into  The bed, or when you just lie down to rest ?                          Yes   Do you usually cough after meals or eating?         Yes   Do you cough when (or after) you bend over?    Yes   GERD SCORE     Kouffman Reflux v Neurogenic Cough Differentiator Neurogenic   Do you more-or-less cough all day long? sporadic   Does change of temperature make you cough? Yes/cold air   Does laughing or chuckling cause you to cough?  maybe   Do fumes (perfume, automobile fumes, burned  Toast, etc.,) cause you to cough ?      no   Does speaking, singing, or talking on the phone cause you to cough   ?               yes   Neurogenic/Airway score      Really Not limited by breathing from desired activities   rec GERD   Continue nexium 40 mg Take 30-60 min before first meal of the day and zantac 300 mg after supper  Stop trelegy  Start symbicort 80 Take 2 puffs first thing in am and then another 2 puffs about 12 hours later.  Work on inhaler technique:  relax and gently blow all the way   For drainage / throat tickle try take CHLORPHENIRAMINE  4  mg - take one every 4 hours Please schedule a follow up office visit in 4 weeks, sooner if needed  with all medications /inhalers/ solutions in hand so we can verify exactly what you are taking. This includes all medications from all doctors and over the counters     06/28/2017  f/u ov/Wert re: cough x 2014 Chief Complaint  Patient presents with  . Follow-up    reports her non-productive cough better with occassional clear phlegm.   Dyspnea:  MMRC2 = can't walk a nl pace on a flat grade s sob but does fine slow and flat  Cough: much better/ min mucoid  Sleep: better despite h1 just in am  SABA use:  None rec For drainage / throat tickle try take CHLORPHENIRAMINE  4 mg - up to 1 every 4 h as needed if tol Stop all the oil based vitamins you can  Calcium carbonate should be replaced by same amount  of calcium gluconate         08/17/2017  f/u ov/Wert re:  Cough x 2014   Chief Complaint  Patient presents with  . Follow-up    Nonproductive cough, difficulty swallowing at times.  was doing some better then 2 weeks worse, only taking one h1 in am s drowsiness  Dyspnea:  Variably get sob with adls  but always on hills, never at rest  Cough: sporadic daytime Sleeping: better - no more noct events  SABA use: maybe once a month  Still on allergy shots but thinking about  stopping  As not convinced helping    No obvious day to day or daytime variability or assoc excess/ purulent sputum or mucus plugs or hemoptysis or cp or chest tightness, subjective wheeze or overt sinus or hb symptoms.   Sleeping ok  one pillow  without nocturnal  or early am exacerbation  of respiratory  c/o's or need for noct saba. Also denies any obvious fluctuation of symptoms with weather or environmental changes or other aggravating or alleviating factors except as outlined above   No unusual exposure hx or h/o childhood pna/ asthma or knowledge of premature birth.  Current Allergies, Complete Past Medical History, Past Surgical History, Family History, and Social History were reviewed in [employer] [facility] record.  ROS  The following are not active complaints unless bolded Hoarseness, sore throat, dysphagia, dental problems, itching, sneezing,  nasal congestion or discharge of excess mucus or purulent secretions, ear ache,   fever, chills, sweats, unintended wt loss or wt gain, classically pleuritic or exertional cp,  orthopnea pnd or arm/hand swelling  or leg swelling, presyncope, palpitations, abdominal pain, anorexia, nausea, vomiting, diarrhea  or change in bowel habits or change in bladder habits, change in stools or change in urine, dysuria, hematuria,  rash, arthralgias, visual complaints, headache, numbness, weakness or ataxia or problems with walking or coordination,  change in mood or  memory.        Current Meds  Medication Sig  . albuterol (PROAIR HFA) 108 (90 Base) MCG/ACT inhaler Inhale 2 puffs into the lungs every 6 (six) hours as needed for wheezing or shortness of breath.  . Aloe Vera 25 MG CAPS Take 25 mg by mouth daily.  Marland Kitchen aspirin 81 MG tablet Take 81 mg by mouth daily.  . baclofen (LIORESAL) 10 MG tablet Take 1 tablet (10 mg total) by mouth at bedtime.  . Biotin 5000 MCG TABS Take 5,000 mcg by mouth daily.  . budesonide-formoterol (SYMBICORT) 80-4.5  MCG/ACT  inhaler Inhale 2 puffs into the lungs 2 (two) times daily.  . calcium carbonate (OSCAL) 1500 (600 Ca) MG TABS tablet Take 600 mg by mouth daily.  . chlorpheniramine (CHLOR-TRIMETON) 4 MG tablet Take 4 mg by mouth 2 (two) times daily as needed for allergies.  . Cholecalciferol (VITAMIN D3) 1000 units CAPS Take 1 capsule (1,000 Units total) by mouth daily.  . Coenzyme Q10 (COQ10) 100 MG CAPS Take 100 mg by mouth daily.   . Cranberry 500 MG TABS Take 500 mg by mouth daily.  Marland Kitchen DHA-Vitamin C-Lutein (EYE HEALTH FORMULA PO) Take by mouth daily.  . diphenhydramine-acetaminophen (ACETAMINOPHEN PM) 25-500 MG TABS tablet Take 1 tablet by mouth at bedtime as needed.  Marland Kitchen esomeprazole (NEXIUM) 40 MG capsule Take 1 capsule (40 mg total) by mouth daily before breakfast.  . Garlic (GARLIQUE PO) Take 1 tablet by mouth daily.  . Glucosamine-Chondroitin (OSTEO BI-FLEX REGULAR STRENGTH PO) Take 2 tablets by mouth daily with supper.  . losartan (COZAAR) 50 MG tablet TAKE ONE AND ONE-HALF (1.5) TABLET BY MOUTH DAILY  . metFORMIN (GLUCOPHAGE) 500 MG tablet TAKE 1 TABLET BY MOUTH TWICE A DAY WITH A MEAL.  . montelukast (SINGULAIR) 10 MG tablet TAKE ONE TABLET BY MOUTH EVERY NIGHT AT BEDTIME  . NONFORMULARY OR COMPOUNDED ITEM Allergy Vaccine  Given at [facility] ENT  . polyethylene glycol (MIRALAX / GLYCOLAX) packet Take 17 g by mouth daily.  . Polyvinyl Alcohol-Povidone (REFRESH OP) Place 1 drop into both eyes daily.   Marland Kitchen PROAIR HFA 108 (90 Base) MCG/ACT inhaler INHALE TWO PUFFS INTO THE LUNGS EVERY 6 HOURS AS NEEDED FOR WHEEZING OR SHORTNESS OF BREATH.  . ranitidine (ZANTAC) 300 MG tablet Take 1 tablet (300 mg total) by mouth at bedtime.  . Saline (SIMPLY SALINE) 0.9 % AERS Place into the nose as needed.  Marland Kitchen Specialty Vitamins Products (CARDIOVASCULAR SUPPORT PO) Take by mouth daily.  Marland Kitchen SYNTHROID 100 MCG tablet TAKE 1 TABLET BY MOUTH DAILY WITH BREAKFAST.  Marland Kitchen triamterene-hydrochlorothiazide (DYAZIDE) 37.5-25 MG  capsule TAKE ONE CAPSULE BY MOUTH EVERY MORNING                          Objective:   Physical Exam  Pleasant wf nad did not cough entire ov   08/17/2017        177  06/28/2017          179   05/31/17 179 lb 9.6 oz (81.5 kg)  04/12/17 175 lb 6.4 oz (79.6 kg)  03/31/17 176 lb 6.4 oz (80 kg)     Vital signs reviewed - Note on arrival 02 sats  99% on RA    HEENT: nl dentition, turbinates bilaterally, and oropharynx. Nl external ear canals without cough reflex   NECK :  without JVD/Nodes/TM/ nl carotid upstrokes bilaterally   LUNGS: no acc muscle use,  Nl contour chest which is clear to A and P bilaterally without cough on insp or exp maneuvers   CV:  RRR  no s3 or murmur or increase in P2, and no edema   ABD:  soft and nontender with nl inspiratory excursion in the supine position. No bruits or organomegaly appreciated, bowel sounds nl  MS:  Nl gait/ ext warm without deformities, calf tenderness, cyanosis or clubbing No obvious joint restrictions   SKIN: warm and dry without lesions    NEURO:  alert, approp, nl sensorium with  no motor or cerebellar deficits apparent.  CXR PA and Lateral:   08/17/2017 :    I personally reviewed images and   impression as follows:   Mild/ mod kyphosis/ no infiltrates     Labs ordered 08/17/2017  Allergy profile       Assessment:

## 2017-08-17 NOTE — Patient Instructions (Addendum)
Please remember to go to the lab and x-ray department downstairs in the basement  for your tests - we will call you with the results when they are available.  For drainage / throat tickle try take CHLORPHENIRAMINE  4 mg - take one- two every 4 hours as needed -    If cough worse or after you see ENT > Prednisone 10 mg take  4 each am x 2 days,   2 each am x 2 days,  1 each am x 2 days and stop      Proceed with ent work up and surgery if still suggested by GI/ surgery as the best way to reduce the number of medications you are having to take to control your cough

## 2017-08-18 ENCOUNTER — Telehealth: Payer: Self-pay | Admitting: Internal Medicine

## 2017-08-18 ENCOUNTER — Encounter: Payer: Self-pay | Admitting: Internal Medicine

## 2017-08-18 LAB — RESPIRATORY ALLERGY PROFILE REGION II ~~LOC~~
Allergen, A. alternata, m6: <0.1 kU/L
Allergen, Cedar tree, t12: <0.1 kU/L
Allergen, Comm Silver Birch, t9: <0.1 kU/L
Allergen, Cottonwood, t14: <0.1 kU/L
Allergen, D pternoyssinus,d7: 0.36 kU/L — ABNORMAL HIGH
Allergen, Mouse Urine Protein, e78: <0.1 kU/L
Allergen, Mulberry, t76: <0.1 kU/L
Allergen, Oak,t7: <0.1 kU/L
Allergen, P. notatum, m1: <0.1 kU/L
Aspergillus fumigatus, m3: <0.1 kU/L
Bermuda Grass: <0.1 kU/L
Box Elder IgE: <0.1 kU/L
CLADOSPORIUM HERBARUM (M2) IGE: <0.1 kU/L
COMMON RAGWEED (SHORT) (W1) IGE: <0.1 kU/L
Cat Dander: 0.38 kU/L — ABNORMAL HIGH
Class: 0
Class: 0
Class: 0
Class: 0
Class: 0
Class: 0
Class: 0
Class: 0
Class: 0
Class: 0
Class: 0
Class: 0
Class: 0
Class: 0
Class: 0
Class: 0
Class: 0
Class: 0
Class: 0
Class: 0
Class: 0
Class: 1
Class: 1
Class: 1
Cockroach: <0.1 kU/L
D. farinae: 0.39 kU/L — ABNORMAL HIGH
Dog Dander: <0.1 kU/L
Elm IgE: <0.1 kU/L
IgE (Immunoglobulin E), Serum: 16 kU/L (ref <=114)
Johnson Grass: <0.1 kU/L
Pecan/Hickory Tree IgE: 0.2 kU/L — ABNORMAL HIGH
Rough Pigweed  IgE: <0.1 kU/L
Sheep Sorrel IgE: <0.1 kU/L
Timothy Grass: <0.1 kU/L

## 2017-08-18 LAB — INTERPRETATION:

## 2017-08-18 NOTE — Assessment & Plan Note (Signed)
Spirometry 01/23/08 >> FEV1 1.83 (69%), FEV1% 82 FENO 12/29/15- 37-elevated, indicating allergic component to airway irritation. Office Spirometry 12/29/2015-moderately severe obstructive airways disease-FVC 1.99/60%, FEV1 1.29/51%, ratio 0.65, FEF 25-75 0.73/39%. - FENO 05/31/2017  =   29  On trelegy - 05/31/2017    rx  symb 80  - FENO 08/17/2017  = 25   p am symbicort 80 x 2 pffs  - 08/17/2017  After extensive coaching inhaler device  effectiveness =    75% hfa - Spirometry 08/17/2017  FEV1 1.57 (65%)  Ratio 74     I believe the asthma component to her problem is minimal at present based on absence of nocturnal cough and note feno is lower on symb  80 than it was on trelegy which has a stronger ICS but aggravates uacs which is what I think is driving the cough now  (see separate a/p)

## 2017-08-18 NOTE — Telephone Encounter (Signed)
Pt is aware of below message and voiced her understanding. Nothing further is needed. 

## 2017-08-18 NOTE — Progress Notes (Signed)
LMTCB

## 2017-08-18 NOTE — Telephone Encounter (Signed)
Called and spoke with pt about her lab results.  Pt voiced her understanding.  She is requesting the results of her cxr.  MW please advise. Thanks

## 2017-08-18 NOTE — Telephone Encounter (Signed)
My result note is in error for the cxr > let her know I reviewed it and agree with radiology it is nl except for her spine where she had the surgery and still has some mild curvature related to osteoporosis which we knew about already

## 2017-08-18 NOTE — Assessment & Plan Note (Signed)
-   see manometry/ ph studies 04/11/17  With good symptom correlation but rare events> not enough to warrant NF per Dr Gasper LloydNandigan  But willing to consider NF next  - Allergy profile 08/17/2017 >  Eos 0.2 /  IgE pending    Upper airway cough syndrome (previously labeled PNDS),  is so named because it's frequently impossible to sort out how much is  CR/sinusitis with freq throat clearing (which can be related to primary GERD)   vs  causing  secondary (" extra esophageal")  GERD from wide swings in gastric pressure that occur with throat clearing, often  promoting self use of mint and menthol lozenges that reduce the lower esophageal sphincter tone and exacerbate the problem further in a cyclical fashion.   These are the same pts (now being labeled as having "irritable larynx syndrome" by some cough centers) who not infrequently have a history of having failed to tolerate ace inhibitors,  dry powder inhalers or biphosphonates or report having atypical/extraesophageal reflux symptoms that don't respond to standard doses of PPI  and are easily confused as having aecopd or asthma flares by even experienced allergists/ pulmonologists (myself included).   rec max rx with 1st gen H1 blockers per guidelines  And if not effective add 6 days prednisone after she sees ENT for second opinion   I had an extended discussion with the patient/daugher  reviewing all relevant studies completed to date and  lasting 15 to 20 minutes of a 25 minute visit    See device teaching which extended face to face time for this visit.  Each maintenance medication was reviewed in detail including emphasizing most importantly the difference between maintenance and prns and under what circumstances the prns are to be triggered using an action plan format that is not reflected in the computer generated alphabetically organized AVS which I have not found useful in most complex patients, especially with respiratory illnesses  Please see AVS for  specific instructions unique to this visit that I personally wrote and verbalized to the the pt in detail and then reviewed with pt  by my nurse highlighting any  changes in therapy recommended at today's visit to their plan of care.

## 2017-08-19 ENCOUNTER — Encounter: Payer: Self-pay | Admitting: Family Medicine

## 2017-08-19 ENCOUNTER — Ambulatory Visit: Payer: Medicare Other | Admitting: Family Medicine

## 2017-08-19 VITALS — BP 170/92 | HR 86 | Temp 97.9°F | Ht 67.0 in | Wt 175.8 lb

## 2017-08-19 DIAGNOSIS — K219 Gastro-esophageal reflux disease without esophagitis: Secondary | ICD-10-CM | POA: Diagnosis not present

## 2017-08-19 DIAGNOSIS — R252 Cramp and spasm: Secondary | ICD-10-CM

## 2017-08-19 DIAGNOSIS — I10 Essential (primary) hypertension: Secondary | ICD-10-CM | POA: Diagnosis not present

## 2017-08-19 DIAGNOSIS — E119 Type 2 diabetes mellitus without complications: Secondary | ICD-10-CM | POA: Diagnosis not present

## 2017-08-19 DIAGNOSIS — M8000XD Age-related osteoporosis with current pathological fracture, unspecified site, subsequent encounter for fracture with routine healing: Secondary | ICD-10-CM

## 2017-08-19 DIAGNOSIS — R05 Cough: Secondary | ICD-10-CM

## 2017-08-19 DIAGNOSIS — S22080A Wedge compression fracture of T11-T12 vertebra, initial encounter for closed fracture: Secondary | ICD-10-CM

## 2017-08-19 DIAGNOSIS — R058 Other specified cough: Secondary | ICD-10-CM

## 2017-08-19 LAB — MAGNESIUM: Magnesium: 1.9 mg/dL (ref 1.5–2.5)

## 2017-08-19 LAB — POCT GLYCOSYLATED HEMOGLOBIN (HGB A1C): Hemoglobin A1C: 6.3 % — AB (ref 4.0–5.6)

## 2017-08-19 LAB — BASIC METABOLIC PANEL
BUN: 14 mg/dL (ref 6–23)
CO2: 30 mEq/L (ref 19–32)
Calcium: 9.8 mg/dL (ref 8.4–10.5)
Chloride: 97 mEq/L (ref 96–112)
Creatinine, Ser: 0.87 mg/dL (ref 0.40–1.20)
GFR: 67.13 mL/min (ref >60.00)
Glucose, Bld: 117 mg/dL — ABNORMAL HIGH (ref 70–99)
Potassium: 5 mEq/L (ref 3.5–5.1)
Sodium: 134 mEq/L — ABNORMAL LOW (ref 135–145)

## 2017-08-19 NOTE — Patient Instructions (Addendum)
Bring me copy of your latest bone density scan Blood pressure was elevated today - start monitoring at home and let me know readings especially if consistently >140/90.  Sugars were doing well! Trial off vitamin C and aloe vera for 1-2 weeks to see if any effect.  For leg cramps - labs today and we will be in touch with results.

## 2017-08-19 NOTE — Progress Notes (Signed)
BP (!) 170/92 (BP Location: Right Arm, Cuff Size: Normal)   Pulse 86   Temp 97.9 F (36.6 C) (Oral)   Ht 5\' 7"  (1.702 m)   Wt 175 lb 12 oz (79.7 kg)   SpO2 97%   BMI 27.53 kg/m    CC: 6 mo f/u visit Subjective:    Patient ID: Jordan Patterson, female    DOB: 04/15/1940, 77 y.o.   MRN: [id number]  HPI: Jordan Patterson is a 77 y.o. female presenting on 08/19/2017 for 6 mo follow up (Pt accompanied by daughter.)   Osteoporosis with fracture - calcitonin started 06/2014, DEXA 2016 showed osteopenia T score 0.3 spine, -1.2 hip. Declined oral bisphosphonate given severe GERD/HH. Declined prolia or IV bisphosphonate. She states she had more recent dexa at [facility] - requested to bring [place] records of this.   Chronic cough - sees pulm and saw GI, has seen gen surg - normal esophageal manometry 03/2017. Treated with baclofen 10mg  nightly for LES relaxation which has helped, continued PPI BID and zantac at bedtime. She has been referred to Dr Doylene Canard [facility] to consider further anti-reflux surgery (Nissen). Has been referred to ENT. Going off menthol has helped cough.   BP elevated today - complaint with losartan 50mg  and triam/hctz daily. Has not been monitoring pressures at home.   Worsening leg cramps - worse at night time. Has tried blue emu cream  Saw Dr Ashley Royalty urogyn for uterine prolapse - rec against surgery. Thought cough contributing.   Relevant past medical, surgical, family and social history reviewed and updated as indicated. Interim medical history since our last visit reviewed. Allergies and medications reviewed and updated. Outpatient Medications Prior to Visit  Medication Sig Dispense Refill  . albuterol (PROAIR HFA) 108 (90 Base) MCG/ACT inhaler Inhale 2 puffs into the lungs every 6 (six) hours as needed for wheezing or shortness of breath. 1 Inhaler 3  . Aloe Vera 25 MG CAPS Take 25 mg by mouth daily.    Marland Kitchen aspirin 81 MG tablet Take 81 mg by mouth daily.    . baclofen (LIORESAL) 10  MG tablet Take 1 tablet (10 mg total) by mouth at bedtime. 30 each 3  . Biotin 5000 MCG TABS Take 5,000 mcg by mouth daily.    . budesonide-formoterol (SYMBICORT) 80-4.5 MCG/ACT inhaler Inhale 2 puffs into the lungs 2 (two) times daily. 1 Inhaler 11  . calcium carbonate (OSCAL) 1500 (600 Ca) MG TABS tablet Take 600 mg by mouth daily.    . chlorpheniramine (CHLOR-TRIMETON) 4 MG tablet Take 4 mg by mouth 2 (two) times daily as needed for allergies.    . Cholecalciferol (VITAMIN D3) 1000 units CAPS Take 1 capsule (1,000 Units total) by mouth daily. 30 capsule   . Coenzyme Q10 (COQ10) 100 MG CAPS Take 100 mg by mouth daily.     . Cranberry 500 MG TABS Take 500 mg by mouth daily.    Marland Kitchen DHA-Vitamin C-Lutein (EYE HEALTH FORMULA PO) Take by mouth daily.    . diphenhydramine-acetaminophen (ACETAMINOPHEN PM) 25-500 MG TABS tablet Take 1 tablet by mouth at bedtime as needed.    Marland Kitchen esomeprazole (NEXIUM) 40 MG capsule Take 1 capsule (40 mg total) by mouth daily before breakfast. 30 capsule 6  . Garlic (GARLIQUE PO) Take 1 tablet by mouth daily.    . Glucosamine-Chondroitin (OSTEO BI-FLEX REGULAR STRENGTH PO) Take 2 tablets by mouth daily with supper.    . losartan (COZAAR) 50 MG tablet TAKE ONE  AND ONE-HALF (1.5) TABLET BY MOUTH DAILY 45 tablet 11  . metFORMIN (GLUCOPHAGE) 500 MG tablet TAKE 1 TABLET BY MOUTH TWICE A DAY WITH A MEAL. 180 tablet 3  . montelukast (SINGULAIR) 10 MG tablet TAKE ONE TABLET BY MOUTH EVERY NIGHT AT BEDTIME 30 tablet 3  . NONFORMULARY OR COMPOUNDED ITEM Allergy Vaccine  Given at [facility]    . polyethylene glycol (MIRALAX / GLYCOLAX) packet Take 17 g by mouth daily.    . Polyvinyl Alcohol-Povidone (REFRESH OP) Place 1 drop into both eyes daily.     . predniSONE (DELTASONE) 10 MG tablet Take  4 each am x 2 days,   2 each am x 2 days,  1 each am x 2 days and stop 14 tablet 0  . PROAIR HFA 108 (90 Base) MCG/ACT inhaler INHALE TWO PUFFS INTO THE LUNGS EVERY 6 HOURS AS NEEDED FOR  WHEEZING OR SHORTNESS OF BREATH. 18 g 5  . ranitidine (ZANTAC) 300 MG tablet Take 1 tablet (300 mg total) by mouth at bedtime. 30 tablet 6  . Saline (SIMPLY SALINE) 0.9 % AERS Place into the nose as needed.    Marland Kitchen. Specialty Vitamins Products (CARDIOVASCULAR SUPPORT PO) Take by mouth daily.    Marland Kitchen. SYNTHROID 100 MCG tablet TAKE 1 TABLET BY MOUTH DAILY WITH BREAKFAST. 30 tablet 11  . triamterene-hydrochlorothiazide (DYAZIDE) 37.5-25 MG capsule TAKE ONE CAPSULE BY MOUTH EVERY MORNING 30 capsule 11   No facility-administered medications prior to visit.      Per HPI unless specifically indicated in ROS section below Review of Systems     Objective:    BP (!) 170/92 (BP Location: Right Arm, Cuff Size: Normal)   Pulse 86   Temp 97.9 F (36.6 C) (Oral)   Ht 5\' 7"  (1.702 m)   Wt 175 lb 12 oz (79.7 kg)   SpO2 97%   BMI 27.53 kg/m   Wt Readings from Last 3 Encounters:  08/19/17 175 lb 12 oz (79.7 kg)  08/17/17 177 lb 6.4 oz (80.5 kg)  07/13/17 179 lb 6 oz (81.4 kg)    Physical Exam  Constitutional: She appears well-developed and well-nourished. No distress.  HENT:  Mouth/Throat: Oropharynx is clear and moist. No oropharyngeal exudate.  Cardiovascular: Normal rate, regular rhythm and normal heart sounds.  No murmur heard. Pulmonary/Chest: Effort normal and breath sounds normal. No respiratory distress. She has no wheezes. She has no rales.  Musculoskeletal: She exhibits no edema.  Skin: Skin is warm.  Nursing note and vitals reviewed.  Results for orders placed or performed in visit on 08/19/17  Basic metabolic panel  Result Value Ref Range   Sodium 134 (L) 135 - 145 mEq/L   Potassium 5.0 3.5 - 5.1 mEq/L   Chloride 97 96 - 112 mEq/L   CO2 30 19 - 32 mEq/L   Glucose, Bld 117 (H) 70 - 99 mg/dL   BUN 14 6 - 23 mg/dL   Creatinine, Ser 4.090.87 0.40 - 1.20 mg/dL   Calcium 9.8 8.4 - 81.110.5 mg/dL   GFR 91.4767.13 >82.95>60.00 mL/min  Magnesium  Result Value Ref Range   Magnesium 1.9 1.5 - 2.5 mg/dL    POCT glycosylated hemoglobin (Hb A1C)  Result Value Ref Range   Hemoglobin A1C 6.3 (A) 4.0 - 5.6 %   HbA1c POC (<> result, manual entry)  4.0 - 5.6 %   HbA1c, POC (prediabetic range)  5.7 - 6.4 %   HbA1c, POC (controlled diabetic range)  0.0 - 7.0 %  Lab Results  Component Value Date   TSH 4.00 02/09/2017       Assessment & Plan:   Problem List Items Addressed This Visit    Upper airway cough syndrome   Osteoporosis with fracture    Now off calcitonin. Limited options given bad GERD.  Will await results of latest DEXA scan at [facility] 01/2017, will review and then make recommendations for further treatment.      Leg cramping    Check lytes. Currently taking blue emu cream (oleic and lineloic acid) Consider theraworx (vit E-based) cream      Relevant Orders   Basic metabolic panel (Completed)   Magnesium (Completed)   Hypertension    Chronic, deteriorated despite compliance with losartan 50mg  and maxzide daily. I asked Courtne to monitor blood pressures at home over next 1-2 wks and update me with readings - if persistently elevated will need med titration.       GERD (gastroesophageal reflux disease) - Primary    Marked, with HH. Has had normal esophageal manometry and baclofen. To see gen surgery (Dr Doylene Canard) to consider Nissen, pt hesitant.  Suggested trial off oral aloe and vit C to see if any improvement.       Diabetes type 2, controlled (HCC)    Chronic, stable. Update A1c. Continue metformin 500mg  bid.       Relevant Orders   POCT glycosylated hemoglobin (Hb A1C) (Completed)   Compression fracture of T12 vertebra (HCC)    Will await latest dexa to review (01/2017)          No orders of the defined types were placed in this encounter.  Orders Placed This Encounter  Procedures  . Basic metabolic panel  . Magnesium  . POCT glycosylated hemoglobin (Hb A1C)    Follow up plan: Return if symptoms worsen or fail to improve.  Eustaquio Boyden, MD

## 2017-08-21 DIAGNOSIS — R252 Cramp and spasm: Secondary | ICD-10-CM | POA: Insufficient documentation

## 2017-08-21 NOTE — Assessment & Plan Note (Signed)
Will await latest dexa to review (01/2017)

## 2017-08-21 NOTE — Assessment & Plan Note (Signed)
Chronic, stable. Update A1c. Continue metformin 500mg  bid.

## 2017-08-21 NOTE — Assessment & Plan Note (Signed)
Now off calcitonin. Limited options given bad GERD.  Will await results of latest D[facility] scan at [facility] 01/2017, will review and then make recommendations for further treatment.

## 2017-08-21 NOTE — Assessment & Plan Note (Signed)
Chronic, deteriorated despite compliance with losartan 50mg  and maxzide daily. I asked Carney BernJean to monitor blood pressures at home over next 1-2 wks and update me with readings - if persistently elevated will need med titration.

## 2017-08-21 NOTE — Assessment & Plan Note (Addendum)
Check lytes. Currently taking blue emu cream (oleic and lineloic acid) Consider theraworx (vit E-based) cream

## 2017-08-21 NOTE — Assessment & Plan Note (Addendum)
Marked, with HH. Has had normal esophageal manometry and baclofen. To see gen surgery (Dr Doylene Canardonner) to consider Nissen, pt hesitant.  Suggested trial off oral aloe and vit C to see if any improvement.

## 2017-08-24 ENCOUNTER — Telehealth: Payer: Self-pay | Admitting: Family Medicine

## 2017-08-24 DIAGNOSIS — K219 Gastro-esophageal reflux disease without esophagitis: Secondary | ICD-10-CM | POA: Insufficient documentation

## 2017-08-24 DIAGNOSIS — M8000XD Age-related osteoporosis with current pathological fracture, unspecified site, subsequent encounter for fracture with routine healing: Secondary | ICD-10-CM

## 2017-08-24 DIAGNOSIS — M810 Age-related osteoporosis without current pathological fracture: Secondary | ICD-10-CM | POA: Insufficient documentation

## 2017-08-24 NOTE — Telephone Encounter (Signed)
PT dropped off [facility] bone density from 01/26/17. I placed in your in box for review.

## 2017-08-26 NOTE — Telephone Encounter (Signed)
Reviewed DEXA with osteopenia T 0 spine, -1.4 hip (01/2017)

## 2017-08-30 ENCOUNTER — Other Ambulatory Visit: Payer: Self-pay | Admitting: Internal Medicine

## 2017-09-24 ENCOUNTER — Other Ambulatory Visit: Payer: Self-pay | Admitting: Gastroenterology

## 2017-09-29 ENCOUNTER — Other Ambulatory Visit: Payer: Self-pay | Admitting: Gastroenterology

## 2017-10-26 ENCOUNTER — Encounter: Payer: Self-pay | Admitting: Family Medicine

## 2017-10-27 MED ORDER — VALSARTAN 160 MG PO TABS: 160.0000 mg | ORAL_TABLET | Freq: Every day | ORAL | 1 refills | Status: DC

## 2017-11-23 ENCOUNTER — Ambulatory Visit (INDEPENDENT_AMBULATORY_CARE_PROVIDER_SITE_OTHER): Payer: Medicare Other

## 2017-11-23 DIAGNOSIS — Z23 Encounter for immunization: Secondary | ICD-10-CM

## 2017-11-25 NOTE — Telephone Encounter (Addendum)
plz notify I reviewed dexa from [facility] 01/2017.  Bone density scan was overall stable.  Options for further treatment include: 1. Just continue calcium and vit D and regular weight bearing exercises with routine monitoring with DEXA scan Q2 yrs 2. prolia shots Q6 months 3. IV bisphosphonate 4. Estrogen like medicine like evista.  Since she's had prior fracture, I would be more prone to choose options 2-4.  Also, ensure blood pressures are back to normal <140/90 (at last visit I had asked her to check BP and notify me if staying elevated).

## 2017-11-25 NOTE — Telephone Encounter (Deleted)
plz notify I reviewed dexa from [facility] 01/2017.  Bone density scan was overall stable.  Options for further treatment include: 1. Just continue calcium and vit D and regular weight bearing exercises with routine monitoring with DEXA scan Q2 yrs 2. prolia shots Q6 months 3. IV bisphosphonate 4. Estrogen like medicine like evista.  Since she's had prior fracture, I would be more prone to choose options 2-4.  Also, ensure blood pressures are back to normal <140/90 (at last visit I had asked her to check BP and notify me if staying elevated).

## 2017-11-25 NOTE — Telephone Encounter (Signed)
Left message on vm per dpr relaying Dr. G's message.  

## 2017-11-28 NOTE — Telephone Encounter (Signed)
Mailed a letter with Dr. Timoteo Expose message.

## 2017-12-12 ENCOUNTER — Encounter: Payer: Self-pay | Admitting: Family Medicine

## 2017-12-13 NOTE — Telephone Encounter (Signed)
ERROR

## 2017-12-18 ENCOUNTER — Other Ambulatory Visit: Payer: Self-pay | Admitting: Family Medicine

## 2017-12-19 ENCOUNTER — Telehealth: Payer: Self-pay | Admitting: Family Medicine

## 2017-12-19 NOTE — Telephone Encounter (Signed)
I am happy to accept her in a new patient appointment slot.

## 2017-12-19 NOTE — Telephone Encounter (Signed)
Appointment 11/25 with Jordan Patterson Pt aware

## 2017-12-19 NOTE — Telephone Encounter (Signed)
See below crm  Ok to schedule transfer of care appointment  Copied from CRM [id number]. Topic: Appointment Scheduling - Transfer of Care >> Dec 16, 2017  3:47 PM Arlyss Gandy, NT wrote: Pt is requesting to transfer FROM: Dr. Sharen Hones Pt is requesting to transfer TO: one of the NPs Reason for requested transfer: She states she feels as if Dr. Sharen Hones has not been taking care of her as she would have expect him to.   Send CRM to patient's current PCP (transferring FROM).

## 2017-12-19 NOTE — Telephone Encounter (Signed)
Overall very pleasant patient. Thanks.

## 2018-01-02 ENCOUNTER — Other Ambulatory Visit: Payer: Self-pay | Admitting: Internal Medicine

## 2018-01-16 ENCOUNTER — Ambulatory Visit: Payer: Medicare Other | Admitting: Family Medicine

## 2018-01-16 ENCOUNTER — Encounter: Payer: Self-pay | Admitting: Family Medicine

## 2018-01-16 VITALS — BP 166/88 | HR 85 | Temp 98.3°F | Ht 67.0 in | Wt 175.0 lb

## 2018-01-16 DIAGNOSIS — I10 Essential (primary) hypertension: Secondary | ICD-10-CM | POA: Diagnosis not present

## 2018-01-16 DIAGNOSIS — R05 Cough: Secondary | ICD-10-CM | POA: Diagnosis not present

## 2018-01-16 DIAGNOSIS — R058 Other specified cough: Secondary | ICD-10-CM

## 2018-01-16 NOTE — Progress Notes (Signed)
Subjective:    Patient ID: Jordan Patterson, female    DOB: Oct 20, 1940, 77 y.o.   MRN: [id number]  HPI This is a 77 yo female who presents today to see if she wants to change providers.   She has had chronic cough and has been told it is from her asthma and GERD. Has seen pulmonary and ENT. Coughs daily, embarrassing when she is in public. Rarely is cough productive. No post nasal drainage or congestion. If she uses albuterol inhaler, will have a little irritation and more cough with the first inhalation and some calming down with second inhalation. She wonders if valsartan is contributing to her cough.   HTN- checks bp at home, similar readings. Eats a lot of prepared foods, doesn't enjoy cooking for herself.   Good hydration.   Not currently seeing cardiology. Last visit about 3 years ago.   Past Medical History:  Diagnosis Date  . Allergic rhinitis   . Arthritis    hands and knees - ?osteo  . Asthma   . Chronic headaches   . Compression fracture of T12 vertebra (HCC) 07/18/2014   S/p kyphoplasty by Dr Newell CoralNudelman (08/2014)   . Depression    pt denies  . Diabetes type 2, controlled (HCC) 2004  . Environmental allergies    dust,mold,mildew  . Extrinsic asthma   . GERD (gastroesophageal reflux disease)    [name]([place]) EGD - mild esophageal dysmotility and small hiatal hernia  . Hiatal hernia   . Hx of migraines   . Hyperlipidemia    mild, diet controlled  . Hypertension   . Hypothyroidism   . Macular degeneration   . Osteoporosis with fracture 07/2014   T -1.2 hip, T 0.2 spine, T12 compression fracture s/p kyphoplasty   Past Surgical History:  Procedure Laterality Date  . 24 HOUR PH STUDY N/A 04/11/2017   Procedure: 24 HOUR PH STUDY;  Surgeon: Napoleon FormNandigam, Kavitha V, MD;  Location: [facility];  Service: Endoscopy;  Laterality: N/A;  . CATARACT EXTRACTION  2003   bilateral with lens implant  . COLONOSCOPY    . DEXA  05/2008   improved (initial DEXA 2007 - mild osteopenia)  .  DILATION AND CURETTAGE OF UTERUS  1978  . ESOPHAGEAL MANOMETRY N/A 04/11/2017   Procedure: ESOPHAGEAL MANOMETRY (EM);  Surgeon: Napoleon FormNandigam, Kavitha V, MD;  Location: [facility];  Service: Endoscopy;  Laterality: N/A;  . ESOPHAGOGASTRODUODENOSCOPY  06/25/2011   Procedure: ESOPHAGOGASTRODUODENOSCOPY (EGD);  Surgeon: Hart Carwinora M Brodie, MD;  [facility];  Service: Endoscopy;  Laterality: N/A;  no Xray  . KYPHOPLASTY N/A 08/30/2014   Procedure: THORACIC TWELVE KYPHOPLASTY;  Surgeon: Shirlean Kellyobert Nudelman, MD  . LAPAROSCOPY ABDOMEN DIAGNOSTIC     To R/O endometriosis  . PH IMPEDANCE STUDY N/A 04/11/2017   Procedure: PH IMPEDANCE STUDY;  Surgeon: Napoleon FormNandigam, Kavitha V, MD;  Location: [facility];  Service: Endoscopy;  Laterality: N/A;  . SAVORY DILATION  06/25/2011   Procedure: SAVORY DILATION;  Surgeon: Hart Carwinora M Brodie, MD;  Location: [facility];  Service: Endoscopy;  Laterality: N/A;  . TONSILLECTOMY AND ADENOIDECTOMY  1962  . US ECHOCARDIOGRAPHY  10/2014   nl systolic function EF 55%, mild diastolic dysfunction   Family History  Problem Relation Age of Onset  . Mitral valve prolapse Father   . Diabetes Father   . Mitral valve prolapse Mother   . Hypertension Mother   . Stroke Mother [id number]       after valve surgery  . Parkinson's disease Brother   .  Other Brother        POST WAR TRAUMA  . Stroke Paternal Grandfather   . Heart failure Maternal Grandfather   . Cancer Neg Hx   . Colon cancer Neg Hx   . Stomach cancer Neg Hx   . Esophageal cancer Neg Hx   . Pancreatic cancer Neg Hx   . Liver disease Neg Hx    Social History   Tobacco Use  . Smoking status: Never Smoker  . Smokeless tobacco: Never Used  Substance Use Topics  . Alcohol use: No    Alcohol/week: 0.0 standard drinks  . Drug use: No      Review of Systems Per HPI    Objective:   Physical Exam Physical Exam  Constitutional: Oriented to person, place, and time. She appears well-developed and well-nourished.  HENT:  Head:  Normocephalic and atraumatic.  Eyes: Conjunctivae are normal.  Neck: Normal range of motion. Neck supple.  Cardiovascular: Normal rate, regular rhythm and normal heart sounds.   Pulmonary/Chest: Effort normal and breath sounds normal.  Musculoskeletal: No edema.  Neurological: Alert and oriented to person, place, and time.  Skin: Skin is warm and dry.  Psychiatric: Normal mood and affect. Behavior is normal. Judgment and thought content normal.  Vitals reviewed.     BP (!) 166/88 (BP Location: Right Arm, Patient Position: Sitting, Cuff Size: Normal)   Pulse 85   Temp 98.3 F (36.8 C) (Oral)   Ht 5\' 7"  (1.702 m)   Wt 175 lb (79.4 kg)   SpO2 98%   BMI 27.41 kg/m  Wt Readings from Last 3 Encounters:  01/16/18 175 lb (79.4 kg)  08/19/17 175 lb 12 oz (79.7 kg)  08/17/17 177 lb 6.4 oz (80.5 kg)   BP Readings from Last 3 Encounters:  01/16/18 (!) 166/88  08/19/17 (!) 170/92  08/17/17 134/84       Assessment & Plan:  1. Essential hypertension - not well controlled and very limited on medications she can tolerate, will have see cardiology to help with management of this - discussed sodium intake and encouraged her to reduce prepared and packaged foods, continue exercise - Ambulatory referral to Cardiology  2. Upper airway cough syndrome - reviewed notes from ENT and pulmonary - I did not witness any cough, ? Anxiety component - Discussed trigger avoidance, can try otc Delsym, keep mouth covered in cold weather, continue albuterol inhaler use prn  - she has follow up scheduled   Olean Ree, FNP-BC  [facility], [facility]  01/18/2018 7:52 AM

## 2018-01-16 NOTE — Patient Instructions (Signed)
Very nice to meet you today  I have put in a referral for you to go back to cardiology to discuss your blood pressure issues  I suggest you try Delsym (generic is fine) over the counter every 12 hours as needed for cough, continue cough lozenges, avoid triggers, use Albuterol inhaler every 4-6 hours as needed. Keep mouth covered in cold weather.   Please follow up in 3-4 months

## 2018-01-18 ENCOUNTER — Encounter: Payer: Self-pay | Admitting: Family Medicine

## 2018-02-27 ENCOUNTER — Telehealth: Payer: Self-pay | Admitting: Family Medicine

## 2018-02-27 ENCOUNTER — Ambulatory Visit (INDEPENDENT_AMBULATORY_CARE_PROVIDER_SITE_OTHER): Payer: Medicare Other

## 2018-02-27 ENCOUNTER — Ambulatory Visit: Payer: Medicare Other

## 2018-02-27 VITALS — BP 124/96 | HR 72 | Temp 98.1°F | Ht 67.0 in | Wt 178.0 lb

## 2018-02-27 DIAGNOSIS — E118 Type 2 diabetes mellitus with unspecified complications: Secondary | ICD-10-CM | POA: Diagnosis not present

## 2018-02-27 DIAGNOSIS — E039 Hypothyroidism, unspecified: Secondary | ICD-10-CM

## 2018-02-27 DIAGNOSIS — E785 Hyperlipidemia, unspecified: Secondary | ICD-10-CM | POA: Diagnosis not present

## 2018-02-27 DIAGNOSIS — M810 Age-related osteoporosis without current pathological fracture: Secondary | ICD-10-CM

## 2018-02-27 DIAGNOSIS — Z0000 Encounter for general adult medical examination without abnormal findings: Secondary | ICD-10-CM | POA: Diagnosis not present

## 2018-02-27 DIAGNOSIS — I10 Essential (primary) hypertension: Secondary | ICD-10-CM

## 2018-02-27 LAB — LIPID PANEL
Cholesterol: 248 mg/dL — ABNORMAL HIGH (ref 0–200)
HDL: 53.8 mg/dL (ref >39.00)
LDL Cholesterol: 166 mg/dL — ABNORMAL HIGH (ref 0–99)
NonHDL: 194.64
Total CHOL/HDL Ratio: 5
Triglycerides: 141 mg/dL (ref 0.0–149.0)
VLDL: 28.2 mg/dL (ref 0.0–40.0)

## 2018-02-27 LAB — T4, FREE: Free T4: 1.18 ng/dL (ref 0.60–1.60)

## 2018-02-27 LAB — COMPREHENSIVE METABOLIC PANEL
ALT: 21 U/L (ref 0–35)
AST: 20 U/L (ref 0–37)
Albumin: 4.3 g/dL (ref 3.5–5.2)
Alkaline Phosphatase: 60 U/L (ref 39–117)
BUN: 18 mg/dL (ref 6–23)
CO2: 29 mEq/L (ref 19–32)
Calcium: 9.8 mg/dL (ref 8.4–10.5)
Chloride: 95 mEq/L — ABNORMAL LOW (ref 96–112)
Creatinine, Ser: 0.79 mg/dL (ref 0.40–1.20)
GFR: 74.93 mL/min (ref >60.00)
Glucose, Bld: 126 mg/dL — ABNORMAL HIGH (ref 70–99)
Potassium: 4.6 mEq/L (ref 3.5–5.1)
Sodium: 133 mEq/L — ABNORMAL LOW (ref 135–145)
Total Bilirubin: 0.3 mg/dL (ref 0.2–1.2)
Total Protein: 6.9 g/dL (ref 6.0–8.3)

## 2018-02-27 LAB — VITAMIN D 25 HYDROXY (VIT D DEFICIENCY, FRACTURES): VITD: 82.39 ng/mL (ref 30.00–100.00)

## 2018-02-27 LAB — LDL CHOLESTEROL, DIRECT: Direct LDL: 169 mg/dL

## 2018-02-27 LAB — HEMOGLOBIN A1C: Hgb A1c MFr Bld: 6.7 % — ABNORMAL HIGH (ref 4.6–6.5)

## 2018-02-27 LAB — TSH: TSH: 1.75 u[IU]/mL (ref 0.35–4.50)

## 2018-02-27 MED ORDER — METFORMIN HCL 500 MG PO TABS: ORAL_TABLET | ORAL | 1 refills | Status: DC

## 2018-02-27 NOTE — Telephone Encounter (Signed)
Refill sent as requested. 

## 2018-02-27 NOTE — Progress Notes (Signed)
PCP notes:   Health maintenance:  Foot exam - PCP follow-up requested A1C - completed  Abnormal screenings:   None  Patient concerns:   None  Nurse concerns:  None  Next PCP appt:   03/10/18 @ 1130

## 2018-02-27 NOTE — Progress Notes (Signed)
Subjective:   Jordan Patterson is a 78 y.o. female who presents for Medicare Annual (Subsequent) preventive examination.  Review of Systems:  N/A Cardiac Risk Factors include: advanced age (>2155men, 36>65 women);diabetes mellitus;dyslipidemia;hypertension     Objective:     Vitals: BP (!) 124/96 (BP Location: Left Arm, Patient Position: Sitting, Cuff Size: Normal) Comment: BP medication not taken  Pulse 72   Temp 98.1 F (36.7 C) (Oral)   Ht 5\' 7"  (1.702 m) Comment: no shoes  Wt 178 lb (80.7 kg)   SpO2 96%   BMI 27.88 kg/m   Body mass index is 27.88 kg/m.  Advanced Directives 02/27/2018 02/09/2017 02/09/2016 08/30/2014 06/25/2011  Does Patient Have a Medical Advance Directive? Yes Yes Yes Yes Patient has advance directive, copy not in chart  Type of Advance Directive Healthcare Power of [place]orney;Living will Healthcare Power of [place];Living will Healthcare Power of [place];Living will Healthcare Power of [place];Living will Healthcare Power of [place];Living will  Copy of Healthcare Power of Attorney in Chart? Yes - validated most recent copy scanned in chart (See row information) Yes Yes - -  Pre-existing out of facility DNR order (yellow form or pink MOST form) - - - - Other (comment)    Tobacco Social History   Tobacco Use  Smoking Status Never Smoker  Smokeless Tobacco Never Used     Counseling given: No   Clinical Intake:  Pre-visit preparation completed: Yes  Pain : No/denies pain Pain Score: 0-No pain     Nutritional Status: BMI 25 -29 Overweight Nutritional Risks: None Diabetes: Yes CBG done?: No Did pt. bring in CBG monitor from home?: No  How often do you need to have someone help you when you read instructions, pamphlets, or other written materials from your doctor or pharmacy?: 1 - Never What is the last grade level you completed in school?: 12th grade + 2 yrs college  Interpreter Needed?: No  Comments: pt is a widow and lives alone Information  entered by :: LPinson, LPN  Past Medical History:  Diagnosis Date  . Allergic rhinitis   . Arthritis    hands and knees - ?osteo  . Asthma   . Chronic headaches   . Compression fracture of T12 vertebra (HCC) 07/18/2014   S/p kyphoplasty by Dr Newell CoralNudelman (08/2014)   . Depression    pt denies  . Diabetes type 2, controlled (HCC) 2004  . Environmental allergies    dust,mold,mildew  . Extrinsic asthma   . GERD (gastroesophageal reflux disease)    [name]([facility]) EGD - mild esophageal dysmotility and small hiatal hernia  . Hiatal hernia   . Hx of migraines   . Hyperlipidemia    mild, diet controlled  . Hypertension   . Hypothyroidism   . Macular degeneration   . Osteoporosis with fracture 07/2014   T -1.2 hip, T 0.2 spine, T12 compression fracture s/p kyphoplasty   Past Surgical History:  Procedure Laterality Date  . 24 HOUR PH STUDY N/A 04/11/2017   Procedure: 24 HOUR PH STUDY;  Surgeon: Napoleon FormNandigam, Kavitha V, MD;  Location: [facility];  Service: Endoscopy;  Laterality: N/A;  . CATARACT EXTRACTION  2003   bilateral with lens implant  . COLONOSCOPY    . DEXA  05/2008   improved (initial DEXA 2007 - mild osteopenia)  . DILATION AND CURETTAGE OF UTERUS  1978  . ESOPHAGEAL MANOMETRY N/A 04/11/2017   Procedure: ESOPHAGEAL MANOMETRY (EM);  Surgeon: Napoleon FormNandigam, Kavitha V, MD;  Location: [facility];  Service:  Endoscopy;  Laterality: N/A;  . ESOPHAGOGASTRODUODENOSCOPY  06/25/2011   Procedure: ESOPHAGOGASTRODUODENOSCOPY (EGD);  Surgeon: Hart Carwinora M Brodie, MD;  [facility];  Service: Endoscopy;  Laterality: N/A;  no Xray  . KYPHOPLASTY N/A 08/30/2014   Procedure: THORACIC TWELVE KYPHOPLASTY;  Surgeon: Shirlean Kellyobert Nudelman, MD  . LAPAROSCOPY ABDOMEN DIAGNOSTIC     To R/O endometriosis  . PH IMPEDANCE STUDY N/A 04/11/2017   Procedure: PH IMPEDANCE STUDY;  Surgeon: Napoleon FormNandigam, Kavitha V, MD;  Location: [facility];  Service: Endoscopy;  Laterality: N/A;  . SAVORY DILATION  06/25/2011   Procedure: SAVORY  DILATION;  Surgeon: Hart Carwinora M Brodie, MD;  Location: [facility];  Service: Endoscopy;  Laterality: N/A;  . TONSILLECTOMY AND ADENOIDECTOMY  1962  . US ECHOCARDIOGRAPHY  10/2014   nl systolic function EF 55%, mild diastolic dysfunction   Family History  Problem Relation Age of Onset  . Mitral valve prolapse Father   . Diabetes Father   . Mitral valve prolapse Mother   . Hypertension Mother   . Stroke Mother [id number]       after valve surgery  . Parkinson's disease Brother   . Other Brother        POST WAR TRAUMA  . Stroke Paternal Grandfather   . Heart failure Maternal Grandfather   . Cancer Neg Hx   . Colon cancer Neg Hx   . Stomach cancer Neg Hx   . Esophageal cancer Neg Hx   . Pancreatic cancer Neg Hx   . Liver disease Neg Hx    Social History   Socioeconomic History  . Marital status: Widowed    Spouse name: Not on file  . Number of children: 3  . Years of education: Not on file  . Highest education level: Not on file  Occupational History  . Occupation: Retired  Engineer, productionocial Needs  . Financial resource strain: Not on file  . Food insecurity:    Worry: Not on file    Inability: Not on file  . Transportation needs:    Medical: Not on file    Non-medical: Not on file  Tobacco Use  . Smoking status: Never Smoker  . Smokeless tobacco: Never Used  Substance and Sexual Activity  . Alcohol use: No    Alcohol/week: 0.0 standard drinks  . Drug use: No  . Sexual activity: Never  Lifestyle  . Physical activity:    Days per week: Not on file    Minutes per session: Not on file  . Stress: Not on file  Relationships  . Social connections:    Talks on phone: Not on file    Gets together: Not on file    Attends religious service: Not on file    Active member of club or organization: Not on file    Attends meetings of clubs or organizations: Not on file    Relationship status: Not on file  Other Topics Concern  . 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 albuterol (PROAIR HFA) 108 (90 Base) MCG/ACT inhaler Inhale 2 puffs into the lungs every 6 (six) hours as needed  for wheezing or shortness of breath.  . Aloe Vera 25 MG CAPS Take 25 mg by mouth daily.  Marland Kitchen aspirin 81 MG tablet Take 81 mg by mouth daily.  . Biotin 5000 MCG TABS Take 5,000 mcg by mouth daily.  . calcium carbonate (OSCAL) 1500 (600 Ca) MG TABS tablet Take 600 mg by mouth daily.  . chlorpheniramine (CHLOR-TRIMETON) 4 MG tablet Take 4 mg by mouth 2 (two) times daily as needed for allergies.  . Cholecalciferol (VITAMIN D3) 1000 units CAPS Take 1 capsule (1,000 Units total) by mouth daily.  . Cranberry 500 MG TABS Take 500 mg by mouth daily.  Marland Kitchen DHA-Vitamin C-Lutein (EYE HEALTH FORMULA PO) Take by mouth daily.  . diphenhydramine-acetaminophen (ACETAMINOPHEN PM) 25-500 MG TABS tablet Take 1 tablet by mouth at bedtime as needed.  Marland Kitchen esomeprazole (NEXIUM) 40 MG capsule TAKE 1 CAPSULE BY MOUTH EVERY DAY BEFORE BREAKFAST  . Garlic (GARLIQUE PO) Take 1 tablet by mouth daily.  Marland Kitchen MAGNESIUM PO Take by mouth.  . metFORMIN (GLUCOPHAGE) 500 MG tablet TAKE 1 TABLET BY MOUTH TWICE A DAY WITH A MEAL.  . montelukast (SINGULAIR) 10 MG tablet TAKE ONE TABLET BY MOUTH EVERY NIGHT AT BEDTIME  . Multiple Vitamins-Minerals (MULTIVITAMIN PO) Take 1 tablet by mouth daily.  . polyethylene glycol (MIRALAX / GLYCOLAX) packet Take 17 g by mouth daily.  . Polyvinyl Alcohol-Povidone (REFRESH OP) Place 1 drop into both eyes daily.   . Saline (SIMPLY SALINE) 0.9 % AERS Place into the nose as needed.  Marland Kitchen Specialty Vitamins Products (CARDIOVASCULAR SUPPORT PO) Take by mouth daily.  Marland Kitchen  SYNTHROID 100 MCG tablet TAKE 1 TABLET BY MOUTH DAILY WITH BREAKFAST.  Marland Kitchen triamterene-hydrochlorothiazide (DYAZIDE) 37.5-25 MG capsule TAKE ONE CAPSULE BY MOUTH EVERY MORNING  . valsartan (DIOVAN) 160 MG tablet TAKE 1 TABLET BY MOUTH EVERY DAY **REPLACES LOSARTAN**  . [DISCONTINUED] Coenzyme Q10 (COQ10) 100 MG CAPS Take 100 mg by mouth daily.   . [DISCONTINUED] Glucosamine-Chondroitin (OSTEO BI-FLEX REGULAR STRENGTH PO) Take 2 tablets by mouth daily with supper.  . [DISCONTINUED] NONFORMULARY OR COMPOUNDED ITEM Allergy Vaccine  Given at [facility]  . [DISCONTINUED] PROAIR HFA 108 (90 Base) MCG/ACT inhaler INHALE TWO PUFFS INTO THE LUNGS EVERY 6 HOURS AS NEEDED FOR WHEEZING OR SHORTNESS OF BREATH.  . baclofen (LIORESAL) 10 MG tablet Take 1 tablet (10 mg total) by mouth at bedtime. (Patient not taking: Reported on 02/27/2018)  . budesonide-formoterol (SYMBICORT) 80-4.5 MCG/ACT inhaler Inhale 2 puffs into the lungs 2 (two) times daily. (Patient not taking: Reported on 02/27/2018)   No facility-administered encounter medications on file as of 02/27/2018.     Activities of Daily Living In your present state of health, do you have any difficulty performing the following activities: 02/27/2018  Hearing? Y  Vision? N  Difficulty concentrating or making decisions? N  Walking or climbing stairs? Y  Dressing or bathing? N  Doing errands, shopping? N  Preparing Food and eating ? N  Using the Toilet? N  In the past six months, have you accidently leaked urine? Y  Do you have problems with loss of bowel control? N  Managing your Medications? N  Managing your Finances? N  Housekeeping or managing your Housekeeping? N  Some recent data might be hidden    Patient Care Team: Emi Belfast, FNP as PCP - General (Nurse Practitioner) Eber Jones, MD as Referring Physician (Ophthalmology) Waymon Budge, MD as Consulting Physician (Pulmonary Disease) Meylor, August Saucer, DC as Consulting Physician  (  Chiropractic Medicine) Richrd Humbles, DDS as Consulting Physician (Dentistry)    Assessment:   This is a routine wellness examination for Jordan Patterson.  Hearing Screening Comments: Bilateral hearing aids Vision Screening Comments: Vision exam in Summer 2019 with [facility]/Dr. Sherryll Burger  Exercise Activities and Dietary recommendations Current Exercise Habits: Home exercise routine, Type of exercise: treadmill, Time (Minutes): 30, Frequency (Times/Week): 5, Weekly Exercise (Minutes/Week): 150, Intensity: Moderate, Exercise limited by: None identified  Goals    . Increase physical activity     Weather permitting, I will continue to walk at least 30 minutes 5-6 days per week.        Fall Risk Fall Risk  02/27/2018 02/09/2017 02/09/2016 02/07/2015 01/24/2014  Falls in the past year? 0 No No Yes No  Number falls in past yr: - - - 1 -  Injury with Fall? - - - Yes -  Comment - - - T12 compression fx -  Depression Screen PHQ 2/9 Scores 02/27/2018 02/09/2017 02/09/2016 02/07/2015  PHQ - 2 Score 0 0 0 0  PHQ- 9 Score 0 0 - -     Cognitive Function MMSE - Mini Mental State Exam 02/27/2018 02/09/2017 02/09/2016  Orientation to time 5 5 5   Orientation to Place 5 5 5   Registration 3 3 3   Attention/ Calculation 0 0 0  Recall 3 3 3   Language- name 2 objects 0 0 0  Language- repeat 1 1 1   Language- follow 3 step command 3 3 3   Language- read & follow direction 0 0 0  Write a sentence 0 0 0  Copy design 0 0 0  Total score 20 20 20      PLEASE NOTE: A Mini-Cog screen was completed. Maximum score is 20. A value of 0 denotes this part of Folstein MMSE was not completed or the patient failed this part of the Mini-Cog screening.   Mini-Cog Screening Orientation to Time - Max 5 pts Orientation to Place - Max 5 pts Registration - Max 3 pts Recall - Max 3 pts Language Repeat - Max 1 pts Language Follow 3 Step Command - Max 3 pts     Immunization History  Administered Date(s) Administered  . Influenza  Whole 11/23/2011, 12/06/2012  . Influenza,inj,Quad PF,6+ Mos 11/06/2014, 10/21/2015, 11/23/2016, 11/23/2017  . Influenza-Unspecified 11/22/2013  . Pneumococcal Conjugate-13 05/29/2015  . Pneumococcal Polysaccharide-23 10/23/2005  . Td 01/14/2012    Screening Tests Health Maintenance  Topic Date Due  . FOOT EXAM  03/10/2018 (Originally 08/05/2017)  . OPHTHALMOLOGY EXAM  07/24/2018  . HEMOGLOBIN A1C  08/28/2018  . TETANUS/TDAP  01/13/2022  . INFLUENZA VACCINE  Completed  . DEXA SCAN  Completed  . PNA vac Low Risk Adult  Completed     Plan:     I have personally reviewed, addressed, and noted the following in the patient's chart:  A. Medical and social history B. Use of alcohol, tobacco or illicit drugs  C. Current medications and supplements D. Functional ability and status E.  Nutritional status F.  Physical activity G. Advance directives H. List of other physicians I.  Hospitalizations, surgeries, and ER visits in previous 12 months J.  Vitals K. Screenings to include hearing, vision, cognitive, depression L. Referrals and appointments - none  In addition, I have reviewed and discussed with patient certain preventive protocols, quality metrics, and best practice recommendations. A written personalized care plan for preventive services as well as general preventive health recommendations were provided to patient.  See attached scanned questionnaire for additional  information.   Signed,   Lindell Noe, MHA, BS, LPN Health Coach

## 2018-02-27 NOTE — Patient Instructions (Signed)
Jordan Patterson , Thank you for taking time to come for your Medicare Wellness Visit. I appreciate your ongoing commitment to your health goals. Please review the following plan we discussed and let me know if I can assist you in the future.   These are the goals we discussed: Goals    . Increase physical activity     Weather permitting, I will continue to walk at least 30 minutes 5-6 days per week.        This is a list of the screening recommended for you and due dates:  Health Maintenance  Topic Date Due  . Complete foot exam   03/10/2018*  . Eye exam for diabetics  07/24/2018  . Hemoglobin A1C  08/28/2018  . Tetanus Vaccine  01/13/2022  . Flu Shot  Completed  . DEXA scan (bone density measurement)  Completed  . Pneumonia vaccines  Completed  *Topic was postponed. The date shown is not the original due date.   Preventive Care for Adults  A healthy lifestyle and preventive care can promote health and wellness. Preventive health guidelines for adults include the following key practices.  . A routine yearly physical is a good way to check with your health care provider about your health and preventive screening. It is a chance to share any concerns and updates on your health and to receive a thorough exam.  . Visit your dentist for a routine exam and preventive care every 6 months. Brush your teeth twice a day and floss once a day. Good oral hygiene prevents tooth decay and gum disease.  . The frequency of eye exams is based on your age, health, family medical history, use  of contact lenses, and other factors. Follow your health care provider's recommendations for frequency of eye exams.  . Eat a healthy diet. Foods like vegetables, fruits, whole grains, low-fat dairy products, and lean protein foods contain the nutrients you need without too many calories. Decrease your intake of foods high in solid fats, added sugars, and salt. Eat the right amount of calories for you. Get  information about a proper diet from your health care provider, if necessary.  . Regular physical exercise is one of the most important things you can do for your health. Most adults should get at least 150 minutes of moderate-intensity exercise (any activity that increases your heart rate and causes you to sweat) each week. In addition, most adults need muscle-strengthening exercises on 2 or more days a week.  Silver Sneakers may be a benefit available to you. To determine eligibility, you may visit the website: www.silversneakers.com or contact program at (478)004-3586 Mon-Fri between 8AM-8PM.   . Maintain a healthy weight. The body mass index (BMI) is a screening tool to identify possible weight problems. It provides an estimate of body fat based on height and weight. Your health care provider can find your BMI and can help you achieve or maintain a healthy weight.   For adults 20 years and older: ? A BMI below 18.5 is considered underweight. ? A BMI of 18.5 to 24.9 is normal. ? A BMI of 25 to 29.9 is considered overweight. ? A BMI of 30 and above is considered obese.   . Maintain normal blood lipids and cholesterol levels by exercising and minimizing your intake of saturated fat. Eat a balanced diet with plenty of fruit and vegetables. Blood tests for lipids and cholesterol should begin at age 12 and be repeated every 5 years. If your lipid or  cholesterol levels are high, you are over 50, or you are at high risk for heart disease, you may need your cholesterol levels checked more frequently. Ongoing high lipid and cholesterol levels should be treated with medicines if diet and exercise are not working.  . If you smoke, find out from your health care provider how to quit. If you do not use tobacco, please do not start.  . If you choose to drink alcohol, please do not consume more than 2 drinks per day. One drink is considered to be 12 ounces (355 mL) of beer, 5 ounces (148 mL) of wine, or 1.5  ounces (44 mL) of liquor.  . If you are 3-68 years old, ask your health care provider if you should take aspirin to prevent strokes.  . Use sunscreen. Apply sunscreen liberally and repeatedly throughout the day. You should seek shade when your shadow is shorter than you. Protect yourself by wearing long sleeves, pants, a wide-brimmed hat, and sunglasses year round, whenever you are outdoors.  . Once a month, do a whole body skin exam, using a mirror to look at the skin on your back. Tell your health care provider of new moles, moles that have irregular borders, moles that are larger than a pencil eraser, or moles that have changed in shape or color.

## 2018-02-27 NOTE — Telephone Encounter (Signed)
Pt called office requesting a refill on the Metformin. Pt uses [facility] in [place]. [facility] and [facility] day supply.

## 2018-03-01 NOTE — Progress Notes (Signed)
I reviewed health advisor's note, was available for consultation, and agree with documentation and plan.  

## 2018-03-05 NOTE — Progress Notes (Signed)
Cardiology Office Note:    Date:  03/06/2018   ID:  Jordan HorsemanJean C Landi, DOB [birth year], MRN [id number]  PCP:  Emi BelfastGessner, Deborah B, FNP  Cardiologist:  Lesleigh NoeHenry W Dani Danis III, MD   Referring MD: Emi BelfastGessner, Deborah B, FNP   Chief Complaint  Patient presents with  . Hypertension  . Advice Only    Cough  . Hyperlipidemia    History of Present Illness:    Jordan Patterson is a 78 y.o. female with a hx of hyperlipidemia, chronic dry cough, GERD,  DM II, who is referred for consultation by Olean Reeeborah Gessner, FNP for help with BP control.  The patient is being seen to get an opinion concerning BP control.  He has a longstanding history of hypertension, family history of vascular disease, personal history of hyperlipidemia and hypertension.  She has had a chronic cough for more than 3 years without defined etiology.  The cough has been attributed to ACE inhibitor therapy, statin therapy, and amlodipine.  The cough is never gone away even though the medications were discontinued, therefore suggesting that the cough was not related.  The cough is nonproductive.  Recently switched her primary care to Olean Reeeborah Gessner, FNP who refers her at this time for help with blood pressure control.  Other than cough, the patient has no specific complaints other than dyspnea with heavy physical activity.  She denies syncope, angina, orthopnea, palpitations, PND, and edema.  Past Medical History:  Diagnosis Date  . Allergic rhinitis   . Arthritis    hands and knees - ?osteo  . Asthma   . Chronic headaches   . Compression fracture of T12 vertebra (HCC) 07/18/2014   S/p kyphoplasty by Dr Newell CoralNudelman (08/2014)   . Depression    pt denies  . Diabetes type 2, controlled (HCC) 2004  . Environmental allergies    dust,mold,mildew  . Extrinsic asthma   . GERD (gastroesophageal reflux disease)    [name]([place]) EGD - mild esophageal dysmotility and small hiatal hernia  . Hiatal hernia   . Hx of migraines   . Hyperlipidemia    mild,  diet controlled  . Hypertension   . Hypothyroidism   . Macular degeneration   . Osteoporosis with fracture 07/2014   T -1.2 hip, T 0.2 spine, T12 compression fracture s/p kyphoplasty    Past Surgical History:  Procedure Laterality Date  . 24 HOUR PH STUDY N/A 04/11/2017   Procedure: 24 HOUR PH STUDY;  Surgeon: Napoleon FormNandigam, Kavitha V, MD;  Location: [facility];  Service: Endoscopy;  Laterality: N/A;  . CATARACT EXTRACTION  2003   bilateral with lens implant  . COLONOSCOPY    . DEXA  05/2008   improved (initial DEXA 2007 - mild osteopenia)  . DILATION AND CURETTAGE OF UTERUS  1978  . ESOPHAGEAL MANOMETRY N/A 04/11/2017   Procedure: ESOPHAGEAL MANOMETRY (EM);  Surgeon: Napoleon FormNandigam, Kavitha V, MD;  Location: [facility];  Service: Endoscopy;  Laterality: N/A;  . ESOPHAGOGASTRODUODENOSCOPY  06/25/2011   Procedure: ESOPHAGOGASTRODUODENOSCOPY (EGD);  Surgeon: Hart Carwinora M Brodie, MD;  [facility];  Service: Endoscopy;  Laterality: N/A;  no Xray  . KYPHOPLASTY N/A 08/30/2014   Procedure: THORACIC TWELVE KYPHOPLASTY;  Surgeon: Shirlean Kellyobert Nudelman, MD  . LAPAROSCOPY ABDOMEN DIAGNOSTIC     To R/O endometriosis  . PH IMPEDANCE STUDY N/A 04/11/2017   Procedure: PH IMPEDANCE STUDY;  Surgeon: Napoleon FormNandigam, Kavitha V, MD;  [facility]: [facility];  Service: Endoscopy;  Laterality: N/A;  . SAVORY DILATION  06/25/2011   Procedure:  SAVORY DILATION;  Surgeon: Hart Carwin, MD;  [facility];  Service: Endoscopy;  Laterality: N/A;  . TONSILLECTOMY AND ADENOIDECTOMY  1962  . US ECHOCARDIOGRAPHY  10/2014   nl systolic function EF 55%, mild diastolic dysfunction    Current Medications: Current Meds  Medication Sig  . albuterol (PROAIR HFA) 108 (90 Base) MCG/ACT inhaler Inhale 2 puffs into the lungs every 6 (six) hours as needed for wheezing or shortness of breath.  . Aloe Vera 25 MG CAPS Take 25 mg by mouth daily.  Marland Kitchen aspirin 81 MG tablet Take 81 mg by mouth daily.  . chlorpheniramine (CHLOR-TRIMETON) 4 MG  tablet Take 4 mg by mouth 2 (two) times daily as needed for allergies.  . Cholecalciferol (VITAMIN D3) 1000 units CAPS Take 1 capsule (1,000 Units total) by mouth daily.  Marland Kitchen DHA-Vitamin C-Lutein (EYE HEALTH FORMULA PO) Take by mouth daily.  . diphenhydramine-acetaminophen (ACETAMINOPHEN PM) 25-500 MG TABS tablet Take 1 tablet by mouth at bedtime as needed.  Marland Kitchen esomeprazole (NEXIUM) 40 MG capsule TAKE 1 CAPSULE BY MOUTH EVERY DAY BEFORE BREAKFAST  . Garlic (GARLIQUE PO) Take 1 tablet by mouth daily.  Marland Kitchen MAGNESIUM PO Take by mouth.  . metFORMIN (GLUCOPHAGE) 500 MG tablet TAKE 1 TABLET BY MOUTH TWICE A DAY WITH A MEAL.  . montelukast (SINGULAIR) 10 MG tablet TAKE ONE TABLET BY MOUTH EVERY NIGHT AT BEDTIME  . Multiple Vitamins-Minerals (MULTIVITAMIN PO) Take 1 tablet by mouth daily.  . polyethylene glycol (MIRALAX / GLYCOLAX) packet Take 17 g by mouth daily.  . Polyvinyl Alcohol-Povidone (REFRESH OP) Place 1 drop into both eyes daily.   . Saline (SIMPLY SALINE) 0.9 % AERS Place into the nose as needed.  Marland Kitchen Specialty Vitamins Products (CARDIOVASCULAR SUPPORT PO) Take by mouth daily.  Marland Kitchen SYNTHROID 100 MCG tablet TAKE 1 TABLET BY MOUTH DAILY WITH BREAKFAST.  Marland Kitchen triamterene-hydrochlorothiazide (DYAZIDE) 37.5-25 MG capsule TAKE ONE CAPSULE BY MOUTH EVERY MORNING  . valsartan (DIOVAN) 160 MG tablet TAKE 1 TABLET BY MOUTH EVERY DAY **REPLACES LOSARTAN**     Allergies:   Ace inhibitors; Amlodipine; Crestor [rosuvastatin]; Pregabalin; Tegretol [carbamazepine]; Wellbutrin [bupropion hcl]; Cymbalta [duloxetine hcl]; and Lipitor [atorvastatin]   Social History   Socioeconomic History  . Marital status: Widowed    Spouse name: Not on file  . Number of children: 3  . Years of education: Not on file  . Highest education level: Not on file  Occupational History  . Occupation: Retired  Engineer, production  . Financial resource strain: Not on file  . Food insecurity:    Worry: Not on file    Inability: Not on file    . Transportation needs:    Medical: Not on file    Non-medical: Not on file  Tobacco Use  . Smoking status: Never Smoker  . Smokeless tobacco: Never Used  Substance and Sexual Activity  . Alcohol use: No    Alcohol/week: 0.0 standard drinks  . Drug use: No  . Sexual activity: Never  Lifestyle  . Physical activity:    Days per week: Not on file    Minutes per session: Not on file  . Stress: Not on file  Relationships  . Social connections:    Talks on phone: Not on file    Gets together: Not on file    Attends religious service: Not on file    Active member of club or organization: Not on file    Attends meetings of clubs or organizations: Not on  file    Relationship status: Not on file  Other Topics Concern  . 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 There is no history of Cancer, Colon cancer, Stomach cancer, Esophageal cancer, Pancreatic cancer, or Liver disease.  ROS:   Please see the history of present illness.    Back pain, joint swelling, shortness of breath, cough, difficulty with hearing.  All other systems reviewed and are negative.  EKGs/Labs/Other Studies Reviewed:    The following studies were reviewed today:  Echocardiogram 2016: Study Conclusions  - Left ventricle: The cavity size was normal. There was mild focal   basal and mild concentric  hypertrophy of the septum. Systolic   function was normal. The estimated ejection fraction was 55%.   Wall motion was normal; there were no regional wall motion   abnormalities. Doppler parameters are consistent with abnormal   left ventricular relaxation (grade 1 diastolic dysfunction).   Doppler parameters are consistent with elevated ventricular   end-diastolic filling pressure. - Aortic valve: There was trivial regurgitation. - Aorta: Root not well seen bright reverbatration artifact seen in   PLA veiw. - Atrial septum: No defect or patent foramen ovale was identified.  EKG:  EKG is formed on 03/06/2018 and reveals left atrial abnormality, relatively low voltage, poor R wave progression, and otherwise unremarkable.  When compared to the prior tracing from 2017, no significant change has occurred.  Recent Labs: 08/17/2017: Hemoglobin 13.1; Platelets 205.0 08/19/2017: Magnesium 1.9 02/27/2018: ALT 21; BUN 18; Creatinine, Ser 0.79; Potassium 4.6; Sodium 133; TSH 1.75  Recent Lipid Panel    Component Value Date/Time   CHOL 248 (H) 02/27/2018 0847   CHOL 297 (H) 02/27/2015 1525   TRIG 141.0 02/27/2018 0847   TRIG 172 (H) 02/27/2015 1525   TRIG 129 07/30/2010   HDL 53.80 02/27/2018 0847   HDL 59 02/27/2015 1525   CHOLHDL 5 02/27/2018 0847   VLDL 28.2 02/27/2018 0847   LDLCALC 166 (H) 02/27/2018 0847   LDLCALC 204 (H) 02/27/2015 1525   LDLDIRECT 169.0 02/27/2018 0847    Physical Exam:    VS:  BP (!) 164/94   Pulse 93   Ht 5\' 7"  (1.702 m)   Wt 172 lb 12.8 oz (78.4 kg)   SpO2 96%   BMI 27.06 kg/m     Wt Readings from Last 3 Encounters:  03/06/18 172 lb 12.8 oz (78.4 kg)  02/27/18 178 lb (80.7 kg)  01/16/18 175 lb (79.4 kg)     GEN: Somewhat anxious appearing. No acute distress HEENT: Normal NECK: No JVD. LYMPHATICS: No lymphadenopathy CARDIAC: RRR.  No murmur, gallop, edema VASCULAR: Pulses less and symmetric bilateral radial and carotid, Bruits are absent in the  carotids bilaterally. RESPIRATORY:  Clear to auscultation without rales, wheezing or rhonchi  ABDOMEN: Soft, non-tender, non-distended, No pulsatile mass, MUSCULOSKELETAL: No deformity  SKIN:  Warm and dry NEUROLOGIC:  Alert and oriented x 3 PSYCHIATRIC:  Normal affect   ASSESSMENT:    1. Essential hypertension   2. Hyperlipidemia, unspecified hyperlipidemia type   3. Controlled type 2 diabetes mellitus without complication, without long-term current use of insulin (HCC)   4. Cough    PLAN:    In order of problems listed above:  1. Add amlodipine 5 mg/day.  Further adjustments in medical regimen will be dependent upon response.  May add low-dose beta-blocker therapy and return to hypertension clinic. 2. High risk lipid panel in a patient who is at high risk with background family history of premature atherosclerosis, and personal diagnosis of type 2 diabetes and hypertension.  She has tried statin therapy in the past and has "intolerance".  She will be referred for consideration of alternative measures to lower an extremely high LDL which was documented to be 170 in January.  Also has an elevated triglyceride for which icosapent ethyl may be a reasonable therapy add-on therapy to decrease risk of acute vascular events. 3. Hemoglobin A1c less than 7 is target.  Aerobic activity is encouraged. 4. Cough is been present now for greater than 3 years despite the discontinuation of ACE inhibitor therapy and various other potential causative agents without improvement.  The cough is not likely to be medication related, in my opinion.  Overall, the patient is at high risk for cardiac events due to poorly controlled lipids, blood pressure, and the presence of type 2 diabetes.  I have encouraged 150 minutes of moderate aerobic activity per day, salt restriction, avoidance of nonsteroidal anti-inflammatory agents, and resumed amlodipine 5 mg/day.  Further up titration of therapy will occur in the  hypertension clinic.  Will also be seen in lipid clinic for consideration of treatment of extremely high LDL.  Prior exposure to statin therapy was associated with vague complaints and potential intolerance.  Clinical follow-up with me in several months.   Medication Adjustments/Labs and Tests Ordered: Current medicines are reviewed at length with the patient today.  Concerns regarding medicines are outlined above.  Orders Placed This Encounter  Procedures  . EKG 12-Lead   Meds ordered this encounter  Medications  . amLODipine (NORVASC) 5 MG tablet    Sig: Take 1 tablet (5 mg total) by mouth daily.    Dispense:  90 tablet    Refill:  3    prescriber aware of noted allergy (cough)    Patient Instructions  Medication Instructions:  Your physician has recommended you make the following change in your medication:  1.) start amlodipine 5 mg one a day for blood pressure  If you need a refill on your cardiac medications before your next appointment, please call your pharmacy.   Lab work: none If you have labs (blood work) drawn today and your tests are completely normal, you will receive your results only by: Marland Kitchen MyChart Message (if you have MyChart) OR . A paper copy in the mail If you have any lab test that is abnormal or we need to change your treatment, we will call you to review the results.  Testing/Procedures: none  Follow-Up: At [facility], you and your health needs are our priority.  As part of our continuing mission to provide you with exceptional heart care, we have created designated Provider Care Teams.  These Care Teams include your primary Cardiologist (physician) and Advanced Practice Providers (APPs -  Physician Assistants and Nurse Practitioners) who all work together to provide you with  the care you need, when you need it. You will need a follow up appointment in 2 months.  Please call our office 2 months in advance to schedule this appointment.  You may see  Lesleigh NoeHenry W Christna Kulick III, MD or one of the following Advanced Practice Providers on your designated Care Team:   Norma FredricksonLori Gerhardt, NP Nada BoozerLaura Ingold, NP . Georgie ChardJill McDaniel, NP  Any Other Special Instructions Will Be Listed Below (If Applicable). Your physician recommends that you schedule a follow-up appointment in: 2-4 weeks with PharmD for HTN Clinic to discuss blood pressure and HLD Clinic to discuss PCSK9 mediation for elevated cholesterol.       Signed, Lesleigh NoeHenry W Harace Mccluney III, MD  03/06/2018 4:20 PM    [facility]

## 2018-03-06 ENCOUNTER — Ambulatory Visit: Payer: Medicare Other | Admitting: Interventional Cardiology

## 2018-03-06 ENCOUNTER — Encounter: Payer: Self-pay | Admitting: Interventional Cardiology

## 2018-03-06 VITALS — BP 164/94 | HR 93 | Ht 67.0 in | Wt 172.8 lb

## 2018-03-06 DIAGNOSIS — R059 Cough, unspecified: Secondary | ICD-10-CM

## 2018-03-06 DIAGNOSIS — E119 Type 2 diabetes mellitus without complications: Secondary | ICD-10-CM | POA: Diagnosis not present

## 2018-03-06 DIAGNOSIS — I10 Essential (primary) hypertension: Secondary | ICD-10-CM | POA: Diagnosis not present

## 2018-03-06 DIAGNOSIS — R05 Cough: Secondary | ICD-10-CM | POA: Diagnosis not present

## 2018-03-06 DIAGNOSIS — E785 Hyperlipidemia, unspecified: Secondary | ICD-10-CM

## 2018-03-06 MED ORDER — AMLODIPINE BESYLATE 5 MG PO TABS: 5.0000 mg | ORAL_TABLET | Freq: Every day | ORAL | 3 refills | Status: DC

## 2018-03-06 NOTE — Patient Instructions (Signed)
Medication Instructions:  Your physician has recommended you make the following change in your medication:  1.) start amlodipine 5 mg one a day for blood pressure  If you need a refill on your cardiac medications before your next appointment, please call your pharmacy.   Lab work: none If you have labs (blood work) drawn today and your tests are completely normal, you will receive your results only by: Marland Kitchen MyChart Message (if you have MyChart) OR . A paper copy in the mail If you have any lab test that is abnormal or we need to change your treatment, we will call you to review the results.  Testing/Procedures: none  Follow-Up: At [facility], you and your health needs are our priority.  As part of our continuing mission to provide you with exceptional heart care, we have created designated Provider Care Teams.  These Care Teams include your primary Cardiologist (physician) and Advanced Practice Providers (APPs -  Physician Assistants and Nurse Practitioners) who all work together to provide you with the care you need, when you need it. You will need a follow up appointment in 2 months.  Please call our office 2 months in advance to schedule this appointment.  You may see Lesleigh Noe, MD or one of the following Advanced Practice Providers on your designated Care Team:   Norma Fredrickson, NP Nada Boozer, NP . Georgie Chard, NP  Any Other Special Instructions Will Be Listed Below (If Applicable). Your physician recommends that you schedule a follow-up appointment in: 2-4 weeks with PharmD for HTN Clinic to discuss blood pressure and HLD Clinic to discuss PCSK9 mediation for elevated cholesterol.

## 2018-03-07 ENCOUNTER — Encounter: Payer: Medicare Other | Admitting: Family Medicine

## 2018-03-08 ENCOUNTER — Other Ambulatory Visit: Payer: Self-pay | Admitting: Family Medicine

## 2018-03-08 NOTE — Telephone Encounter (Signed)
Electronic refill request Last office visit 02/27/18 AMW Last refill 03/08/17 #30/11 Upcoming appointment 03/10/18 See drug warning with Valsartan

## 2018-03-10 ENCOUNTER — Ambulatory Visit (INDEPENDENT_AMBULATORY_CARE_PROVIDER_SITE_OTHER): Payer: Medicare Other | Admitting: Family Medicine

## 2018-03-10 ENCOUNTER — Encounter: Payer: Self-pay | Admitting: Family Medicine

## 2018-03-10 VITALS — BP 148/84 | HR 76 | Temp 98.0°F | Ht 67.0 in | Wt 180.0 lb

## 2018-03-10 DIAGNOSIS — E119 Type 2 diabetes mellitus without complications: Secondary | ICD-10-CM | POA: Diagnosis not present

## 2018-03-10 DIAGNOSIS — R05 Cough: Secondary | ICD-10-CM | POA: Diagnosis not present

## 2018-03-10 DIAGNOSIS — Z0000 Encounter for general adult medical examination without abnormal findings: Secondary | ICD-10-CM

## 2018-03-10 DIAGNOSIS — R058 Other specified cough: Secondary | ICD-10-CM

## 2018-03-10 NOTE — Progress Notes (Signed)
Subjective:    Patient ID: Jordan Patterson, female    DOB: November 21, 1940, 78 y.o.   MRN: [id number]  HPI This is a 78 yo female who presents today for CPE. She is accompanied by her daughter.   Last CPE- has regular care Mammo- 10/11/14, desires no further screeing Colonoscopy- desires no further screening Tdap- 01/14/12 Flu- annual Eye- regular Dental- regular Exercise- walking, very active around her home, lives alone "I have to do everything."   Chronic cough- documented for at least 3 years.  She has had extensive work-up from pulmonary, GI, ENT, cardiology.  There is never been a definitive source of cough.  She has been treated for GERD, allergic rhinitis, intolerance to ACE, reactive airway.  She cannot identify any pattern to the cough.  She can go for hours without cough and then it will appear out of the blue.  Her daughter has a video of the patient coughing to show me today.  Family members and those around her become very alarmed when she coughs.  She can get relief by raising her left arm, drinking water and or using a cough lozenge.  Patient states that her family is much more concerned about the this problem than she is.  Past Medical History:  Diagnosis Date  . Allergic rhinitis   . Arthritis    hands and knees - ?osteo  . Asthma   . Chronic headaches   . Compression fracture of T12 vertebra (HCC) 07/18/2014   S/p kyphoplasty by Dr Newell Coral (08/2014)   . Depression    pt denies  . Diabetes type 2, controlled (HCC) 2004  . Environmental allergies    dust,mold,mildew  . Extrinsic asthma   . GERD (gastroesophageal reflux disease)    [name] EGD - mild esophageal dysmotility and small hiatal hernia  . Hiatal hernia   . Hx of migraines   . Hyperlipidemia    mild, diet controlled  . Hypertension   . Hypothyroidism   . Macular degeneration   . Osteoporosis with fracture 07/2014   T -1.2 hip, T 0.2 spine, T12 compression fracture s/p kyphoplasty   Past Surgical  History:  Procedure Laterality Date  . 24 HOUR PH STUDY N/A 04/11/2017   Procedure: 24 HOUR PH STUDY;  Surgeon: Napoleon Form, MD;  [facility]: [facility];  Service: Endoscopy;  Laterality: N/A;  . CATARACT EXTRACTION  2003   bilateral with lens implant  . COLONOSCOPY    . DEXA  05/2008   improved (initial DEXA 2007 - mild osteopenia)  . DILATION AND CURETTAGE OF UTERUS  1978  . ESOPHAGEAL MANOMETRY N/A 04/11/2017   Procedure: ESOPHAGEAL MANOMETRY (EM);  Surgeon: Napoleon Form, MD;  [facility];  Service: Endoscopy;  Laterality: N/A;  . ESOPHAGOGASTRODUODENOSCOPY  06/25/2011   Procedure: ESOPHAGOGASTRODUODENOSCOPY (EGD);  Surgeon: Hart Carwin, MD;  [facility];  Service: Endoscopy;  Laterality: N/A;  no Xray  . KYPHOPLASTY N/A 08/30/2014   Procedure: THORACIC TWELVE KYPHOPLASTY;  Surgeon: [name], MD  . LAPAROSCOPY ABDOMEN DIAGNOSTIC     To R/O endometriosis  . PH IMPEDANCE STUDY N/A 04/11/2017   Procedure: PH IMPEDANCE STUDY;  Surgeon: Napoleon Form, MD;  [facility]: [facility];  Service: Endoscopy;  Laterality: N/A;  . SAVORY DILATION  06/25/2011   Procedure: SAVORY DILATION;  Surgeon: Hart Carwin, MD;  [facility];  Service: Endoscopy;  Laterality: N/A;  . TONSILLECTOMY AND ADENOIDECTOMY  1962  . US ECHOCARDIOGRAPHY  10/2014  nl systolic function EF 55%, mild diastolic dysfunction   Family History  Problem Relation Age of Onset  . Mitral valve prolapse Father   . Diabetes Father   . Mitral valve prolapse Mother   . Hypertension Mother   . Stroke Mother 275       after valve surgery  . Parkinson's disease Brother   . Other Brother        POST WAR TRAUMA  . Stroke Paternal Grandfather   . Heart failure Maternal Grandfather   . Cancer Neg Hx   . Colon cancer Neg Hx   . Stomach cancer Neg Hx   . Esophageal cancer Neg Hx   . Pancreatic cancer Neg Hx   . Liver disease Neg Hx    Social History   Tobacco Use  . Smoking  status: Never Smoker  . Smokeless tobacco: Never Used  Substance Use Topics  . Alcohol use: No    Alcohol/week: 0.0 standard drinks  . Drug use: No      Review of Systems  Constitutional: Negative.   HENT: Negative.   Eyes: Negative.   Respiratory: Positive for cough. Negative for chest tightness, shortness of breath and wheezing.   Cardiovascular: Negative.   Gastrointestinal: Negative.   Endocrine: Negative.   Genitourinary: Negative.   Musculoskeletal: Negative.   Skin: Negative.   Allergic/Immunologic: Negative.   Neurological: Negative.   Hematological: Negative.   Psychiatric/Behavioral: Negative.        Objective:   Physical Exam Vitals signs reviewed. Exam conducted with a chaperone present.  Constitutional:      General: She is not in acute distress.    Appearance: Normal appearance. She is normal weight. She is not ill-appearing, toxic-appearing or diaphoretic.  HENT:     Head: Normocephalic and atraumatic.     Right Ear: External ear normal.     Left Ear: External ear normal.     Nose: Nose normal.     Mouth/Throat:     Mouth: Mucous membranes are moist.     Pharynx: Oropharynx is clear.  Eyes:     Conjunctiva/sclera: Conjunctivae normal.  Neck:     Musculoskeletal: Normal range of motion and neck supple. No neck rigidity or muscular tenderness.  Cardiovascular:     Rate and Rhythm: Normal rate and regular rhythm.     Pulses: Normal pulses.     Heart sounds: Normal heart sounds.  Pulmonary:     Effort: Pulmonary effort is normal.     Breath sounds: Normal breath sounds.  Chest:     Breasts:        Right: Normal.   Abdominal:     General: Abdomen is flat. Bowel sounds are normal. There is no distension.     Palpations: There is no mass.     Tenderness: There is no abdominal tenderness. There is no guarding or rebound.     Hernia: No hernia is present.  Musculoskeletal:        General: No swelling.  Lymphadenopathy:     Cervical: No cervical  adenopathy.  Skin:    General: Skin is warm and dry.  Neurological:     Mental Status: She is alert and oriented to person, place, and time.  Psychiatric:        Mood and Affect: Mood normal.        Behavior: Behavior normal.        Thought Content: Thought content normal.        Judgment: Judgment  normal.    BP (!) 148/84   Pulse 76   Temp 98 F (36.7 C) (Oral)   Ht 5\' 7"  (1.702 m)   Wt 180 lb (81.6 kg)   SpO2 96%   BMI 28.19 kg/m  Wt Readings from Last 3 Encounters:  03/10/18 180 lb (81.6 kg)  03/06/18 172 lb 12.8 oz (78.4 kg)  02/27/18 178 lb (80.7 kg)   BP Readings from Last 3 Encounters:  03/10/18 (!) 148/84  03/06/18 (!) 164/94  02/27/18 (!) 124/96       Assessment & Plan:

## 2018-03-10 NOTE — Patient Instructions (Signed)
Follow up in 6 months  Keeping You Healthy  Get These Tests  Blood Pressure- Have your blood pressure checked by your healthcare provider at least once a year.  Normal blood pressure is 120/80.  Weight- Have your body mass index (BMI) calculated to screen for obesity.  BMI is a measure of body fat based on height and weight.  You can calculate your own BMI at https://www.west-esparza.com/  Cholesterol- Have your cholesterol checked every year.  Diabetes- Have your blood sugar checked every year if you have high blood pressure, high cholesterol, a family history of diabetes or if you are overweight.  Pap Test - Have a pap test every 1 to 5 years if you have been sexually active.  If you are older than 65 and recent pap tests have been normal you may not need additional pap tests.  In addition, if you have had a hysterectomy  for benign disease additional pap tests are not necessary.  Mammogram-Yearly mammograms are essential for early detection of breast cancer  Screening for Colon Cancer- Colonoscopy starting at age 78. Screening may begin sooner depending on your family history and other health conditions.  Follow up colonoscopy as directed by your Gastroenterologist.  Screening for Osteoporosis- Screening begins at age 78 with bone density scanning, sooner if you are at higher risk for developing Osteoporosis.  Get these medicines  Calcium with Vitamin D- Your body requires 1200-1500 mg of Calcium a day and [phone] IU of Vitamin D a day.  You can only absorb 500 mg of Calcium at a time therefore Calcium must be taken in 2 or 3 separate doses throughout the day.  Hormones- Hormone therapy has been associated with increased risk for certain cancers and heart disease.  Talk to your healthcare provider about if you need relief from menopausal symptoms.  Aspirin- Ask your healthcare provider about taking Aspirin to prevent Heart Disease and Stroke.  Get these Immuniztions  Flu shot- Every  fall  Pneumonia shot- Once after the age of 78; if you are younger ask your healthcare provider if you need a pneumonia shot.  Tetanus- Every ten years.  Zostavax- Once after the age of 78 to prevent shingles.  Take these steps  Don't smoke- Your healthcare provider can help you quit. For tips on how to quit, ask your healthcare provider or go to www.smokefree.gov or call 1-800 QUIT-NOW.  Be physically active- Exercise 5 days a week for a minimum of 30 minutes.  If you are not already physically active, start slow and gradually work up to 30 minutes of moderate physical activity.  Try walking, dancing, bike riding, swimming, etc.  Eat a healthy diet- Eat a variety of healthy foods such as fruits, vegetables, whole grains, low fat milk, low fat cheeses, yogurt, lean meats, chicken, fish, eggs, dried beans, tofu, etc.  For more information go to www.thenutritionsource.org  Dental visit- Brush and floss teeth twice daily; visit your dentist twice a year.  Eye exam- Visit your Optometrist or Ophthalmologist yearly.  Drink alcohol in moderation- Limit alcohol intake to one drink or less a day.  Never drink and drive.  Depression- Your emotional health is as important as your physical health.  If you're feeling down or losing interest in things you normally enjoy, please talk to your healthcare provider.  Seat Belts- can save your life; always wear one  Smoke/Carbon Monoxide detectors- These detectors need to be installed on the appropriate level of your home.  Replace batteries at least  once a year.  Violence- If anyone is threatening or hurting you, please tell your healthcare provider.  Living Will/ Health care power of attorney- Discuss with your healthcare provider and family.

## 2018-03-11 ENCOUNTER — Encounter: Payer: Self-pay | Admitting: Family Medicine

## 2018-03-30 ENCOUNTER — Encounter: Payer: Self-pay | Admitting: Pharmacist

## 2018-03-30 ENCOUNTER — Ambulatory Visit (INDEPENDENT_AMBULATORY_CARE_PROVIDER_SITE_OTHER): Payer: Medicare Other | Admitting: Pharmacist

## 2018-03-30 VITALS — BP 180/90 | HR 95

## 2018-03-30 DIAGNOSIS — E785 Hyperlipidemia, unspecified: Secondary | ICD-10-CM | POA: Diagnosis not present

## 2018-03-30 DIAGNOSIS — I10 Essential (primary) hypertension: Secondary | ICD-10-CM

## 2018-03-30 MED ORDER — CHLORTHALIDONE 25 MG PO TABS: 25.0000 mg | ORAL_TABLET | Freq: Every day | ORAL | 11 refills | Status: DC

## 2018-03-30 NOTE — Progress Notes (Addendum)
Patient ID: Jordan Patterson                 DOB: February 19, 1941                      MRN: [id number]     HPI: Jordan Patterson is a 78 y.o. female referred by Dr. Katrinka BlazingSmith to HTN/Lipid clinic. PMH is significant for  hyperlipidemia, chronic dry cough, GERD,  DM II. Per Dr. Katrinka BlazingSmith report at last visit with patient on 1/13: She has had a chronic cough for more than 3 years without defined etiology.  The cough has been attributed to ACE inhibitor therapy, statin therapy, and amlodipine.  The cough is never gone away even though the medications were discontinued, therefore suggesting that the cough was not related. Patient LDL is also elevated. She has reported "intolerance" to statins in the past.  Dr. Katrinka BlazingSmith has started amlodipine 5mg  daily. BP at that time was 164/94 HR 93.   Patient presents to clinic today accompanied by her daughter. Patient denies dizziness, lightheadness, blurred vision. She states that she has a few headaches but not as many as she use to. She brings in her home blood pressure meter. First reading was 197/102, then 185/95. BP with manual cuff was 180/90. Patient states that she feels nervous and is little worked up about the weather. She did take her blood pressure medications this morning. Appears BP cuff is relatively accurate. Home readings at home have been improving over the last 4 days.  Current HTN meds: valsartan 160mg  daily, amlodipine 5mg  daily, triamterene-hydrochlorothiazide 37.5-25mg  daily Current LIPID meds: none Previously tried: lisinopril (cough-but didn't go away when stopped), atorvastatin (cough), rosuvastatin (cough, joint aches/flu like symptoms), carvedilol, olmesartan, losartan (unsure) BP goal: <130/80 LDL goal: <70 LABS: 02/27/2018 TC 248, TG 141, HDL 53, LDL 166  Family History: The patient's family history includes Diabetes in her father; Heart failure in her maternal grandfather; Hypertension in her mother; Mitral valve prolapse in her father and mother; Other in  her brother; Parkinson's disease in her brother; Stroke in her paternal grandfather; Stroke (age of onset: 4875) in her mother. There is no history of Cancer, Colon cancer, Stomach cancer, Esophageal cancer, Pancreatic cancer, or Liver disease.  Social History: never smokes, no ETOH  Diet: green tea (2 cups), decaffe coffee, morton 1/2 sodium, does eat out, chicken, some cheese, some fried foods  Exercise: goes to the [employer] 2-3 days a week, states she's not allowed to do resistance training due to frail bones  Home BP readings: 145/83, 146/79, 148/77, 138/78 HR 78-82  Wt Readings from Last 3 Encounters:  03/10/18 180 lb (81.6 kg)  03/06/18 172 lb 12.8 oz (78.4 kg)  02/27/18 178 lb (80.7 kg)   BP Readings from Last 3 Encounters:  03/10/18 (!) 148/84  03/06/18 (!) 164/94  02/27/18 (!) 124/96   Pulse Readings from Last 3 Encounters:  03/10/18 76  03/06/18 93  02/27/18 72    Renal function: CrCl cannot be calculated (Patient's most recent lab result is older than the maximum 21 days allowed.).  Past Medical History:  Diagnosis Date  . Allergic rhinitis   . Arthritis    hands and knees - ?osteo  . Asthma   . Chronic headaches   . Compression fracture of T12 vertebra (HCC) 07/18/2014   S/p kyphoplasty by Dr Newell CoralNudelman (08/2014)   . Depression    pt denies  . Diabetes type 2, controlled (HCC) 2004  .  Environmental allergies    dust,mold,mildew  . Extrinsic asthma   . GERD (gastroesophageal reflux disease)    [name] EGD - mild esophageal dysmotility and small hiatal hernia  . Hiatal hernia   . Hx of migraines   . Hyperlipidemia    mild, diet controlled  . Hypertension   . Hypothyroidism   . Macular degeneration   . Osteoporosis with fracture 07/2014   T -1.2 hip, T 0.2 spine, T12 compression fracture s/p kyphoplasty    Current Outpatient Medications on File Prior to Visit  Medication Sig Dispense Refill  . albuterol (PROAIR HFA) 108 (90 Base) MCG/ACT inhaler Inhale 2  puffs into the lungs every 6 (six) hours as needed for wheezing or shortness of breath. 1 Inhaler 3  . Aloe Vera 25 MG CAPS Take 25 mg by mouth daily.    Marland Kitchen amLODipine (NORVASC) 5 MG tablet Take 1 tablet (5 mg total) by mouth daily. 90 tablet 3  . aspirin 81 MG tablet Take 81 mg by mouth daily.    . chlorpheniramine (CHLOR-TRIMETON) 4 MG tablet Take 4 mg by mouth 2 (two) times daily as needed for allergies.    . Cholecalciferol (VITAMIN D3) 1000 units CAPS Take 1 capsule (1,000 Units total) by mouth daily. 30 capsule   . DHA-Vitamin C-Lutein (EYE HEALTH FORMULA PO) Take by mouth daily.    . diphenhydramine-acetaminophen (ACETAMINOPHEN PM) 25-500 MG TABS tablet Take 1 tablet by mouth at bedtime as needed.    Marland Kitchen esomeprazole (NEXIUM) 40 MG capsule TAKE 1 CAPSULE BY MOUTH EVERY DAY BEFORE BREAKFAST 90 capsule 2  . Garlic (GARLIQUE PO) Take 1 tablet by mouth daily.    Marland Kitchen MAGNESIUM PO Take by mouth.    . metFORMIN (GLUCOPHAGE) 500 MG tablet TAKE 1 TABLET BY MOUTH TWICE A DAY WITH A MEAL. 180 tablet 1  . montelukast (SINGULAIR) 10 MG tablet TAKE ONE TABLET BY MOUTH EVERY NIGHT AT BEDTIME 30 tablet 3  . Multiple Vitamins-Minerals (MULTIVITAMIN PO) Take 1 tablet by mouth daily.    . polyethylene glycol (MIRALAX / GLYCOLAX) packet Take 17 g by mouth daily.    . Polyvinyl Alcohol-Povidone (REFRESH OP) Place 1 drop into both eyes daily.     . Saline (SIMPLY SALINE) 0.9 % AERS Place into the nose as needed.    Marland Kitchen Specialty Vitamins Products (CARDIOVASCULAR SUPPORT PO) Take by mouth daily.    Marland Kitchen SYNTHROID 100 MCG tablet TAKE ONE TABLET BY MOUTH EVERY MORNING WITH BREAKFAST 30 tablet 0  . triamterene-hydrochlorothiazide (DYAZIDE) 37.5-25 MG capsule TAKE ONE CAPSULE BY MOUTH EVERY MORNING 90 capsule 3  . valsartan (DIOVAN) 160 MG tablet TAKE 1 TABLET BY MOUTH EVERY DAY **REPLACES LOSARTAN** 30 tablet 3   No current facility-administered medications on file prior to visit.     Allergies  Allergen Reactions  .  Ace Inhibitors Cough  . Amlodipine Cough  . Crestor [Rosuvastatin] Other (See Comments)    myalgias  . Pregabalin Other (See Comments)    Unknown reaction  . Tegretol [Carbamazepine] Other (See Comments)    Dizziness, headache  . Wellbutrin [Bupropion Hcl] Other (See Comments)    Unknown reaction  . Cymbalta [Duloxetine Hcl] Palpitations and Other (See Comments)    headaches  . Lipitor [Atorvastatin] Cough    There were no vitals taken for this visit.   Assessment/Plan:  1. Hypertension - BP above goal <130/80. Had a long discussion about classes of blood pressure medications, medications causing a cough, and diet and exercise.  Patients home blood pressure readings are much better than today in clinic. Home cuff appears to be accurate. Patient is on triamterene-hydrochlorothiazide currently. Reports no problems in the past with potassium. Will stop triamterene-hydrochlorothiazide and replace it with chlorthalidone 25mg  daily which should provide better blood pressure lowering due to its longer half life and blood pressure lowering ability. Will need to check BMP at next appointment. Continue amlodipine 5mg  daily and valsartan 160mg  daily.  Patient really doesn't want to add more medications. Therefore if blood pressure is still above goal, I think it is reasonable to max out either valsartan or amlodipine first before adding another medication. Patient instructed to call us with BP consistently >180/>90. Reviewed proper BP taking technique.   2. Hyperlipidemia- Had a long discussion about the importance of statin medications in the prevention of cardiovascular events. Dicussed the possibility that her cough was not from atorvastatin or rosuvastatin since it did not go away after stopping medication. She did have myalgias to rosuvastatin. Discussed starting pravastatin 20mg  daily. Patient wishes to delay starting until after starting chlorthalidone. Will discuss further at next visit. Patient  also asking about PCSK9 inhibitors. Explained the importance of background statin therapy. encouraged we try pravastatin first, then if she cant tolerate or LDL is not at goal can try getting PCSK9 inhibitor approved.   Thank you  Olene FlossMelissa D Xyla Leisner, Pharm.D, BCPS [facility]  [address], [place], [place] [address]  Phone: [phone]) [phone]; Fax: [phone]

## 2018-03-30 NOTE — Patient Instructions (Addendum)
Stop triamterene-hydrochlorothiazide, start chlorthalidone 25mg  daily.  Continue amlodipine 5mg  daily and valsartan 160mg  daily.  Continue checking blood pressure at home. Write down your readings and bring them to clinic with you.  If you consistently have readings >180/>90 give me a call  Call us at [phone] with any questions or concerns

## 2018-04-04 ENCOUNTER — Other Ambulatory Visit: Payer: Self-pay | Admitting: Family Medicine

## 2018-04-12 ENCOUNTER — Ambulatory Visit: Payer: Medicare Other | Admitting: Internal Medicine

## 2018-04-12 ENCOUNTER — Encounter: Payer: Self-pay | Admitting: Internal Medicine

## 2018-04-12 DIAGNOSIS — K219 Gastro-esophageal reflux disease without esophagitis: Secondary | ICD-10-CM | POA: Diagnosis not present

## 2018-04-12 DIAGNOSIS — J4540 Moderate persistent asthma, uncomplicated: Secondary | ICD-10-CM

## 2018-04-12 MED ORDER — ALBUTEROL SULFATE HFA 108 (90 BASE) MCG/ACT IN AERS: 2.0000 | INHALATION_SPRAY | Freq: Four times a day (QID) | RESPIRATORY_TRACT | 12 refills | Status: DC | PRN

## 2018-04-12 MED ORDER — BENZONATATE 200 MG PO CAPS: 200.0000 mg | ORAL_CAPSULE | Freq: Three times a day (TID) | ORAL | 1 refills | Status: DC | PRN

## 2018-04-12 NOTE — Progress Notes (Signed)
HPI F never smoker followed for allergic rhinitis, asthma, complicated by GERD, hypothyroid, DM 2 FENO 12/29/15- 37-elevated, indicating allergic component to airway irritation. Office Spirometry 12/29/2015-moderately severe obstructive airways disease-FVC 1.99/60%, FEV1 1.29/51%, ratio 0.65, FEF 25-75 0.73/39%. Office Spirometry 08/17/2017- Moderate restriction.  FVC 2.1/66%, FEV1 1.6/65%, ratio 1.74, FEF 25-75% 1.3/65% CBC 08/18/2017-eos WNL 2.8, Allergy Profile 08/17/2017-elevated for dust mites, cat, pecan/hickory pollen, total IgE WNL 16 --------------------------------------------------------------------------------------------  04/12/17- 78 year old female never smoker followed for Allergic rhinitis, Asthma, complicated by GERD, hypothyroid, DM 2 ----asthma, cough, acid reflux  Here with her daughter.  She has a nasal probe in for reflux assessment related to her chronic cough. Basic pattern of her cough has not changed in years.  Sometimes hard to clear phlegm but usually dry.  Minimal wheeze.  No glaucoma.  She likes Fisherman's Friend cough drops. She says she got no benefit from benzonatate Perles, various inhalers which made her cough, gabapentin, cough syrups, tramadol.  04/12/2018- 78 year old female never smoker followed for Allergic rhinitis, Asthma, complicated by GERD, hypothyroid, DM 2 Dr Sherene Sires assessed for chronic cough- imp UACS and reflux, Rx'd clortrimeton for pnd, changed from Trelegy to Symbicort. Dr Darlyn Read- imp reflux- advised avoid all chocolate and caffeineincl decaf coffee and advised change from ARB Losartan. -----Asthma/Cough: Pt states she doing fairly well overall-SOB at times-with and without exertion Pro-air HFA, Norvasc, Chlor-Trimeton 4 mg, Tylenol PM nightly, Nexium 40, Diovan Singulair, We discussed her trial of suggestions from Dr. Sherene Sires for chronic cough.  She has limited choice among BP medicines so on Diovan still. Uses ginger cough drops (without methanol  or sugar)-helpful.  Delsym cough syrup helpful.  Quit Symbicort which did not help.  Prefers occasional rescue inhaler, chewing gum ( avoid sugar substitutes). GI offered Nissen fundoplication, with no guarantee if would help cough. Wakes with heartburn- gets up and walks floor, takes menthol cough syrup for relief. Office Spirometry 08/17/2017 Moderate restriction.  FVC 2.1/66%, FEV1 1.6/65%, ratio 1.74, FEF 25-75% 1.3/65% CXR 08/18/2017- IMPRESSION: No acute cardiopulmonary disease. Lower thoracic vertebroplasty Unchanged. CBC 08/18/2017-eos WNL 2.8, Allergy Profile 08/17/2017-elevated for dust mites, cat, pecan/hickory pollen, total IgE WNL 16  ROS-see HPI    += positive Constitutional:   No-   weight loss, night sweats, fevers, chills, fatigue, lassitude. HEENT:   No-  headaches, difficulty swallowing, tooth/dental problems, sore throat,       +sneezing, itching, ear ache, nasal congestion, post nasal drip,  CV:  No-   chest pain, orthopnea, PND, swelling in lower extremities, anasarca,                                                       dizziness, palpitations Resp: No-   shortness of breath with exertion or at rest.              No-   productive cough,  + non-productive cough,  No- coughing up of blood.              No-   change in color of mucus.+ wheezing.   Skin: No-   rash or lesions. GI:  +  heartburn, indigestion, No-abdominal pain, nausea, vomiting, GU:  MS:  No-   joint pain or swelling.  + back pain Neuro-     nothing unusual Psych:  No- change in mood or affect. No  depression or anxiety.  No memory loss.  OBJ- Physical Exam General- Alert, Oriented, Affect-appropriate, Distress- none acute Skin- rash-none, lesions- none, excoriation- none Lymphadenopathy- none Head- atraumatic            Eyes- Gross vision intact, PERRLA, conjunctivae and secretions clear            Ears- clear            Nose- Clear, no-Septal dev, mucus, polyps, erosion, perforation              Throat- Mallampati II , mucosa clear , drainage+white, tonsils- atrophic Neck- flexible , trachea midline, no stridor , thyroid nl, carotid no bruit Chest - symmetrical excursion , unlabored           Heart/CV- RRR , no murmur , no gallop  , no rub, nl s1 s2                           - JVD- none , edema- none, stasis changes- none, varices- none           Lung- clear to P&A/ no crackles, wheeze- none, cough+ dry , dullness-none, rub- none           Chest wall- + back brace Abd-  Br/ Gen/ Rectal- Not done, not indicated Extrem- cyanosis- none, clubbing, none, atrophy- none, strength- nl Neuro- grossly intact to observation

## 2018-04-12 NOTE — Patient Instructions (Signed)
Continue emphasis on reflux management as discussed  Script sent for bezonatate perles for cough to add as needed  Order- schedule PFT  Dx chronic cough

## 2018-04-16 ENCOUNTER — Other Ambulatory Visit: Payer: Self-pay | Admitting: Family Medicine

## 2018-04-17 NOTE — Telephone Encounter (Signed)
Electronic refill request Valsartan Last refill 12/19/17 #30/3 Last office visit 03/10/18 See allergy/contraindication

## 2018-04-20 ENCOUNTER — Encounter: Payer: Self-pay | Admitting: Pharmacist

## 2018-04-20 ENCOUNTER — Ambulatory Visit (INDEPENDENT_AMBULATORY_CARE_PROVIDER_SITE_OTHER): Payer: Medicare Other | Admitting: Pharmacist

## 2018-04-20 VITALS — BP 160/82 | HR 99

## 2018-04-20 DIAGNOSIS — I10 Essential (primary) hypertension: Secondary | ICD-10-CM | POA: Diagnosis not present

## 2018-04-20 MED ORDER — VALSARTAN 320 MG PO TABS: 320.0000 mg | ORAL_TABLET | Freq: Every day | ORAL | 11 refills | Status: DC

## 2018-04-20 NOTE — Patient Instructions (Addendum)
Increase valsartan to 320mg  daily. You may take two tablets of 160mg  tablets until you run out.   I have sent a new prescription for the 320mg  tablets to your pharmacy.  Continue to check your blood pressure at home and bring your log with you to your next appointment.  Follow up with Dr. Katrinka Blazing on 4/2  Call us at [phone] with any questions or concerns.

## 2018-04-20 NOTE — Progress Notes (Signed)
Patient ID: [name]                 DOB: Mar 22, 1940                      MRN: [id number]     HPI: Jordan Patterson is a 78 y.o. female referred by Dr. Katrinka Patterson to HTN/Lipid clinic. PMH is significant for hyperlipidemia, chronic dry cough, GERD,  DM II. Patient has a questionable history of cough due to ACE and statins, due to the fact that cough did not go away after stopping the medications. At last visit patients blood pressure was elevated at 180/90, however home readings the past few days prior to that were in the 140's. Triamterene-HCTZ was changed to chlorthalidone 25mg  daily at last visit.   Home blood pressure meter was compared to manual clinic cuff and appears accurate. Patient appears to have white coat as her blood pressure in clinic tends to be higher than at home.  Statin therapy was discussed, however patient wished to defer to next visit.   Patient presents today in good spirits. She denies dizziness, lightheadness, blurred vision, headaches, swelling. States she has seen a small improvement with blood pressure after medication change.    Current HTN meds: valsartan 160mg  daily, amlodipine 5mg  daily, chlorthalidone 25mg  daily Current LIPID meds: none Previously tried: lisinopril (cough-but didn't go away when stopped), atorvastatin (cough), rosuvastatin (cough, joint aches/flu like symptoms), carvedilol, olmesartan, losartan (unsure) BP goal: <130/80 LDL goal: <100 Risk Factors: ASCVD 10 year risk 49.7%, LDL consistently >160, DM LABS: 02/27/2018 TC 248, TG 141, HDL 53, LDL 166  Family History: The patient's family history includes Diabetes in her father; Heart failure in her maternal grandfather; Hypertension in her mother; Mitral valve prolapse in her father and mother; Other in her brother; Parkinson's disease in her brother; Stroke in her paternal grandfather; Stroke (age of onset: 46) in her mother. There is no history of Cancer, Colon cancer, Stomach cancer, Esophageal  cancer, Pancreatic cancer, or Liver disease.  Social History: never smokes, no ETOH  Diet: green tea (2 cups), decaffe coffee, morton 1/2 sodium, does eat out, chicken, some cheese, some fried foods  Exercise: goes to the [employer] 2-3 days a week, states she's not allowed to do resistance training due to frail bones  Home BP readings: 145/77,  147/72, 143/74, 142/79, 144/82, 140/90 HR 80's  Wt Readings from Last 3 Encounters:  04/12/18 179 lb 9.6 oz (81.5 kg)  03/10/18 180 lb (81.6 kg)  03/06/18 172 lb 12.8 oz (78.4 kg)   BP Readings from Last 3 Encounters:  04/12/18 140/82  03/30/18 (!) 180/90  03/10/18 (!) 148/84   Pulse Readings from Last 3 Encounters:  04/12/18 100  03/30/18 95  03/10/18 76    Renal function: CrCl cannot be calculated (Patient's most recent lab result is older than the maximum 21 days allowed.).  Past Medical History:  Diagnosis Date  . Allergic rhinitis   . Arthritis    hands and knees - ?osteo  . Asthma   . Chronic headaches   . Compression fracture of T12 vertebra (HCC) 07/18/2014   S/p kyphoplasty by Dr Jordan Patterson (08/2014)   . Depression    pt denies  . Diabetes type 2, controlled (HCC) 2004  . Environmental allergies    dust,mold,mildew  . Extrinsic asthma   . GERD (gastroesophageal reflux disease)    Jordan Patterson) EGD - mild esophageal dysmotility and small hiatal hernia  .  Hiatal hernia   . Hx of migraines   . Hyperlipidemia    mild, diet controlled  . Hypertension   . Hypothyroidism   . Macular degeneration   . Osteoporosis with fracture 07/2014   T -1.2 hip, T 0.2 spine, T12 compression fracture s/p kyphoplasty    Current Outpatient Medications on File Prior to Visit  Medication Sig Dispense Refill  . albuterol (PROAIR HFA) 108 (90 Base) MCG/ACT inhaler Inhale 2 puffs into the lungs every 6 (six) hours as needed for wheezing or shortness of breath. 1 Inhaler 12  . Aloe Vera 25 MG CAPS Take 25 mg by mouth daily.    Marland Kitchen amLODipine (NORVASC) 5  MG tablet Take 1 tablet (5 mg total) by mouth daily. 90 tablet 3  . aspirin 81 MG tablet Take 81 mg by mouth daily.    . benzonatate (TESSALON) 200 MG capsule Take 1 capsule (200 mg total) by mouth 3 (three) times daily as needed for cough. 30 capsule 1  . chlorpheniramine (CHLOR-TRIMETON) 4 MG tablet Take 4 mg by mouth 2 (two) times daily as needed for allergies.    . chlorthalidone (HYGROTON) 25 MG tablet Take 1 tablet (25 mg total) by mouth daily. 30 tablet 11  . Cholecalciferol (VITAMIN D3) 1000 units CAPS Take 1 capsule (1,000 Units total) by mouth daily. 30 capsule   . DHA-Vitamin C-Lutein (EYE HEALTH FORMULA PO) Take by mouth daily.    . diphenhydramine-acetaminophen (ACETAMINOPHEN PM) 25-500 MG TABS tablet Take 1 tablet by mouth at bedtime as needed.    Marland Kitchen esomeprazole (NEXIUM) 40 MG capsule TAKE 1 CAPSULE BY MOUTH EVERY DAY BEFORE BREAKFAST 90 capsule 2  . Garlic (GARLIQUE PO) Take 1 tablet by mouth daily.    Marland Kitchen MAGNESIUM PO Take by mouth.    . metFORMIN (GLUCOPHAGE) 500 MG tablet TAKE 1 TABLET BY MOUTH TWICE A DAY WITH A MEAL. 180 tablet 1  . montelukast (SINGULAIR) 10 MG tablet TAKE ONE TABLET BY MOUTH EVERY NIGHT AT BEDTIME 30 tablet 3  . Multiple Vitamins-Minerals (MULTIVITAMIN PO) Take 1 tablet by mouth daily.    . polyethylene glycol (MIRALAX / GLYCOLAX) packet Take 17 g by mouth daily.    . Polyvinyl Alcohol-Povidone (REFRESH OP) Place 1 drop into both eyes daily.     . Saline (SIMPLY SALINE) 0.9 % AERS Place into the nose as needed.    Marland Kitchen Specialty Vitamins Products (CARDIOVASCULAR SUPPORT PO) Take by mouth daily.    Marland Kitchen SYNTHROID 100 MCG tablet TAKE ONE TABLET BY MOUTH EVERY MORNING WITH BREAKFAST 30 tablet 4  . valsartan (DIOVAN) 160 MG tablet TAKE 1 TABLET BY MOUTH EVERY DAY **REPLACES LOSARTAN** 90 tablet 3   No current facility-administered medications on file prior to visit.     Allergies  Allergen Reactions  . Ace Inhibitors Cough  . Amlodipine Cough  . Crestor  [Rosuvastatin] Other (See Comments)    myalgias  . Pregabalin Other (See Comments)    Unknown reaction  . Tegretol [Carbamazepine] Other (See Comments)    Dizziness, headache  . Wellbutrin [Bupropion Hcl] Other (See Comments)    Unknown reaction  . Cymbalta [Duloxetine Hcl] Palpitations and Other (See Comments)    headaches  . Lipitor [Atorvastatin] Cough    There were no vitals taken for this visit.   Assessment/Plan:  1. Hypertension - BP above goal <130/80. Her home blood pressures have been better than clinic readings, but still above goal. Patient would like to avoid more medications if  possible. Therefore I will attempt to max out the doses of her current medications. Will increase valsartan to  daily. Patient has an appointment with Dr. Katrinka Patterson in 5 weeks. I will have this serve as follow up. If blood pressure still not under control at that time, would recommend increasing amlodipine to  daily. We will be happy to see patient back in clinic as recommended by Dr. Katrinka Patterson.   2. Hyperlipidemia-. LDL above goal of < 100. Discussed again starting pravastatin  daily. Patient does not wish to take statins. She states that she understands the risks of having a stroke or heart attack but at her age she is willing to run the risk, she does not want to ever feel like she did again on a statin. States that when God thinks its time, she will go. With patients age, I think it is reasonable to defer treatment. Could discuss PCSK9 in the future if patient would like to readdress.  Thank you  Olene Floss, Pharm.D, BCPS [facility]  [address] [place], [place] [address]  Phone: [phone]; Fax: [phone]

## 2018-04-21 LAB — BASIC METABOLIC PANEL
BUN/Creatinine Ratio: 13 (ref 12–28)
BUN: 10 mg/dL (ref 8–27)
CO2: 26 mmol/L (ref 20–29)
Calcium: 9.5 mg/dL (ref 8.7–10.3)
Chloride: 91 mmol/L — ABNORMAL LOW (ref 96–106)
Creatinine, Ser: 0.8 mg/dL (ref 0.57–1.00)
GFR calc Af Amer: 82 mL/min/{1.73_m2} (ref >59)
GFR calc non Af Amer: 71 mL/min/{1.73_m2} (ref >59)
Glucose: 124 mg/dL — ABNORMAL HIGH (ref 65–99)
Potassium: 3.9 mmol/L (ref 3.5–5.2)
Sodium: 133 mmol/L — ABNORMAL LOW (ref 134–144)

## 2018-04-26 ENCOUNTER — Encounter: Payer: Self-pay | Admitting: *Deleted

## 2018-05-04 ENCOUNTER — Other Ambulatory Visit: Payer: Self-pay | Admitting: Internal Medicine

## 2018-05-17 NOTE — Assessment & Plan Note (Signed)
Primary problem is GERD.  Symbicort was.  Rescue inhaler does help some.  We discussed realistic expectations. Plan- benzonatate, reflux measures, schedule PFT

## 2018-05-17 NOTE — Assessment & Plan Note (Signed)
She is describing significant heartburn especially at night, such that she has to get up and walk to the floor.  She feels limited as to medication she can take.  I have asked her to follow back up with GI and seriously consider fundoplication.

## 2018-05-25 ENCOUNTER — Ambulatory Visit: Payer: Medicare Other | Admitting: Interventional Cardiology

## 2018-06-07 ENCOUNTER — Ambulatory Visit: Payer: Medicare Other

## 2018-06-29 ENCOUNTER — Other Ambulatory Visit: Payer: Self-pay | Admitting: Gastroenterology

## 2018-07-10 NOTE — Telephone Encounter (Signed)
Dr Maple HudsonYoung, Patient is requesting your recommendations.  Dr. Maple HudsonYoung, my family and I last year made reservations for [place] [facility] [place].   My question is, considering the condition of my lungs, would it be wise or unwise for me to attend? I cannot   stand the thought of staying home while the family goes. My family & I have been taking a lot of extra precautions   to stay away from crowds and stay home. I have not been to any store myself since this virus began in early March.     I just will appreciate your expert opinion about the risk factor for a person with a lung condition such as myself.     Thank you, Jordan Patterson    Message routed to Dr Maple HudsonYoung to advise  Allergies  Allergen Reactions  . Ace Inhibitors Cough  . Amlodipine Cough  . Crestor [Rosuvastatin] Other (See Comments)    myalgias  . Pregabalin Other (See Comments)    Unknown reaction  . Tegretol [Carbamazepine] Other (See Comments)    Dizziness, headache  . Wellbutrin [Bupropion Hcl] Other (See Comments)    Unknown reaction  . Cymbalta [Duloxetine Hcl] Palpitations and Other (See Comments)    headaches  . Lipitor [Atorvastatin] Cough   Current Outpatient Medications on File Prior to Visit  Medication Sig Dispense Refill  . albuterol (PROAIR HFA) 108 (90 Base) MCG/ACT inhaler Inhale 2 puffs into the lungs every 6 (six) hours as needed for wheezing or shortness of breath. 1 Inhaler 12  . Aloe Vera 25 MG CAPS Take 25 mg by mouth daily.    Marland Kitchen. amLODipine (NORVASC) 5 MG tablet Take 1 tablet (5 mg total) by mouth daily. 90 tablet 3  . aspirin 81 MG tablet Take 81 mg by mouth daily.    . benzonatate (TESSALON) 200 MG capsule Take 1 capsule (200 mg total) by mouth 3 (three) times daily as needed for cough. 30 capsule 1  . chlorpheniramine (CHLOR-TRIMETON) 4 MG tablet Take 4 mg by mouth 2 (two) times daily as needed for allergies.    . chlorthalidone (HYGROTON) 25 MG tablet Take 1 tablet (25 mg  total) by mouth daily. 30 tablet 11  . Cholecalciferol (VITAMIN D3) 1000 units CAPS Take 1 capsule (1,000 Units total) by mouth daily. 30 capsule   . DHA-Vitamin C-Lutein (EYE HEALTH FORMULA PO) Take by mouth daily.    . diphenhydramine-acetaminophen (ACETAMINOPHEN PM) 25-500 MG TABS tablet Take 1 tablet by mouth at bedtime as needed.    Marland Kitchen. esomeprazole (NEXIUM) 40 MG capsule TAKE 1 CAPSULE BY MOUTH EVERY DAY BEFORE BREAKFAST 90 capsule 2  . Garlic (GARLIQUE PO) Take 1 tablet by mouth daily.    Marland Kitchen. MAGNESIUM PO Take by mouth.    . metFORMIN (GLUCOPHAGE) 500 MG tablet TAKE 1 TABLET BY MOUTH TWICE A DAY WITH A MEAL. 180 tablet 1  . montelukast (SINGULAIR) 10 MG tablet TAKE ONE TABLET BY MOUTH EVERY NIGHT AT BEDTIME 30 tablet 3  . Multiple Vitamins-Minerals (MULTIVITAMIN PO) Take 1 tablet by mouth daily.    . polyethylene glycol (MIRALAX / GLYCOLAX) packet Take 17 g by mouth daily.    . Polyvinyl Alcohol-Povidone (REFRESH OP) Place 1 drop into both eyes daily.     . Saline (SIMPLY SALINE) 0.9 % AERS Place into the nose as needed.    Marland Kitchen. Specialty Vitamins Products (CARDIOVASCULAR SUPPORT PO) Take by mouth daily.    Marland Kitchen. SYNTHROID  100 MCG tablet TAKE ONE TABLET BY MOUTH EVERY MORNING WITH BREAKFAST 30 tablet 4  . valsartan (DIOVAN) 320 MG tablet Take 1 tablet (320 mg total) by mouth daily. 30 tablet 11   No current facility-administered medications on file prior to visit.

## 2018-07-10 NOTE — Telephone Encounter (Signed)
There is a lot of uncertainty. I personally would go, but be very careful to social distance, wear mask in groups, crowds, even with family etc, and wash hands frequently.

## 2018-08-09 ENCOUNTER — Encounter: Payer: Self-pay | Admitting: Internal Medicine

## 2018-08-21 ENCOUNTER — Ambulatory Visit: Payer: Medicare Other | Admitting: Internal Medicine

## 2018-08-21 ENCOUNTER — Other Ambulatory Visit: Payer: Self-pay | Admitting: Family Medicine

## 2018-08-27 ENCOUNTER — Other Ambulatory Visit: Payer: Self-pay | Admitting: Internal Medicine

## 2018-08-27 ENCOUNTER — Other Ambulatory Visit: Payer: Self-pay | Admitting: Family Medicine

## 2018-09-15 ENCOUNTER — Telehealth: Payer: Self-pay | Admitting: Internal Medicine

## 2018-09-15 NOTE — Telephone Encounter (Signed)
Spoke with patient. She stated that she was returning a call to get setup for COVID testing prior to her PFT and OV with Dr. Annamaria Boots on 8/6. Advised her I would let Sharl Ma know that she called back.   Patrice, please advise. Thanks!

## 2018-09-19 NOTE — Telephone Encounter (Signed)
Pt was called to get scheduled for COVID test due to it needing to be done prior to PFT but stated at this time she did not want to have to have the COVID test done so OV and PFT were cancelled.nothing further needed.

## 2018-09-28 ENCOUNTER — Ambulatory Visit: Payer: Medicare Other | Admitting: Internal Medicine

## 2018-10-24 ENCOUNTER — Telehealth: Payer: Self-pay

## 2018-10-24 NOTE — Telephone Encounter (Signed)
Patient doesn't want COVID testing and wants to not schedule any appointment (PFT or OV) until COVID is over or she feels like she needs appointment.    Routing to [employer] as FYI.

## 2018-11-02 NOTE — Telephone Encounter (Signed)
Received the following message from patient:   "Dr. Annamaria Boots, I remember that a few visits ago you encouraged me to take Prednisone for the asthma.  I am now using the inhaler three (3) times a day (which helps control the cough) but I am wondering  if using the Prednisone could possibly help.  I do not want to use the Prednisone b/c of weight gain but I also do not want to cough continuously.   I would appreciate your opinion. Thank you, Jordan Patterson"  Sent another message back to get more information about the cough.

## 2018-11-03 MED ORDER — PREDNISONE 10 MG PO TABS: 10.0000 mg | ORAL_TABLET | Freq: Every day | ORAL | 0 refills | Status: DC

## 2018-11-03 NOTE — Addendum Note (Signed)
Addended by: Jannette Spanner on: 11/03/2018 12:45 PM   Modules accepted: Orders

## 2018-11-03 NOTE — Telephone Encounter (Signed)
Please advise Dr. Annamaria Boots.

## 2018-11-03 NOTE — Telephone Encounter (Signed)
Long -term prednisone has a lot of important side effects which we would discuss more if need be. For now, let's see what prednisone 10 mg each morning for 2 weeks would do for this cough.  Order prednisone 10 mg, # 14,   1 daily, no ref

## 2018-11-03 NOTE — Telephone Encounter (Signed)
Called pt and advised message from the provider. Pt understood and verbalized understanding. Nothing further is needed.   Rx sent into the pharmacy.  

## 2018-11-09 ENCOUNTER — Ambulatory Visit (INDEPENDENT_AMBULATORY_CARE_PROVIDER_SITE_OTHER): Payer: Medicare Other

## 2018-11-09 DIAGNOSIS — Z23 Encounter for immunization: Secondary | ICD-10-CM | POA: Diagnosis not present

## 2018-11-29 ENCOUNTER — Encounter: Payer: Self-pay | Admitting: Family Medicine

## 2018-12-06 ENCOUNTER — Encounter: Payer: Self-pay | Admitting: Family Medicine

## 2018-12-06 ENCOUNTER — Other Ambulatory Visit: Payer: Self-pay

## 2018-12-06 ENCOUNTER — Ambulatory Visit (INDEPENDENT_AMBULATORY_CARE_PROVIDER_SITE_OTHER): Payer: Medicare Other | Admitting: Family Medicine

## 2018-12-06 VITALS — BP 118/82 | HR 96 | Temp 98.3°F | Wt 179.8 lb

## 2018-12-06 DIAGNOSIS — E039 Hypothyroidism, unspecified: Secondary | ICD-10-CM

## 2018-12-06 DIAGNOSIS — E785 Hyperlipidemia, unspecified: Secondary | ICD-10-CM | POA: Diagnosis not present

## 2018-12-06 DIAGNOSIS — E119 Type 2 diabetes mellitus without complications: Secondary | ICD-10-CM | POA: Diagnosis not present

## 2018-12-06 DIAGNOSIS — M7989 Other specified soft tissue disorders: Secondary | ICD-10-CM

## 2018-12-06 DIAGNOSIS — I10 Essential (primary) hypertension: Secondary | ICD-10-CM

## 2018-12-06 MED ORDER — LEVOTHYROXINE SODIUM 100 MCG PO TABS: 100.0000 ug | ORAL_TABLET | Freq: Every day | ORAL | 1 refills | Status: DC

## 2018-12-06 NOTE — Patient Instructions (Signed)
Good to see you today  PLease follow up in 3-4 months for your annual exam, we will check your thyroid level then

## 2018-12-06 NOTE — Progress Notes (Signed)
Subjective:    Patient ID: Jordan Patterson, female    DOB: April 03, 1940, 78 y.o.   MRN: [id number] Chief Complaint  Patient presents with  . Follow-up    Routine follow up- discuss meds and blood work    HPI This is 79 yo white female who comes to the office for routine follow up. Pt is concerned about possible fluid retention due to mild swelling in both ankles in the past month. She reports that left ankle feels more swollen than right. Pt denies any SOB and states she walks couple miles daily without SOB.  Pt also reports some pain in her fingers and toes bilaterally. She suspects that it is primarily related to her arthritis. She takes Tylenol PM in the evening with moderate relief.   Pt would like to switch from her Synthroid to generic version due to high cost.   Past Medical History:  Diagnosis Date  . Allergic rhinitis   . Arthritis    hands and knees - ?osteo  . Asthma   . Chronic headaches   . Compression fracture of T12 vertebra ([place]) 07/18/2014   S/p kyphoplasty by Dr Sherwood Gambler (08/2014)   . Depression    pt denies  . Diabetes type 2, controlled ([place]) 2004  . Environmental allergies    dust,mold,mildew  . Extrinsic asthma   . GERD (gastroesophageal reflux disease)    Olevia Perches) EGD - mild esophageal dysmotility and small hiatal hernia  . Hiatal hernia   . Hx of migraines   . Hyperlipidemia    mild, diet controlled  . Hypertension   . Hypothyroidism   . Macular degeneration   . Osteoporosis with fracture 07/2014   T -1.2 hip, T 0.2 spine, T12 compression fracture s/p kyphoplasty   Past Surgical History:  Procedure Laterality Date  . [place] STUDY N/A 04/11/2017   Procedure: [place] STUDY;  Surgeon: Mauri Pole, MD;  Location: [facility];  Service: Endoscopy;  Laterality: N/A;  . CATARACT EXTRACTION  2003   bilateral with lens implant  . COLONOSCOPY    . DEXA  05/2008   improved (initial DEXA 2007 - mild osteopenia)  . DILATION AND CURETTAGE OF  UTERUS  1978  . ESOPHAGEAL MANOMETRY N/A 04/11/2017   Procedure: ESOPHAGEAL MANOMETRY (EM);  Surgeon: Mauri Pole, MD;  Location: [facility];  Service: Endoscopy;  Laterality: N/A;  . ESOPHAGOGASTRODUODENOSCOPY  06/25/2011   Procedure: ESOPHAGOGASTRODUODENOSCOPY (EGD);  Surgeon: Lafayette Dragon, MD;  Location: [facility];  Service: Endoscopy;  Laterality: N/A;  no Xray  . KYPHOPLASTY N/A 08/30/2014   Procedure: THORACIC TWELVE KYPHOPLASTY;  Surgeon: Jovita Gamma, MD  . LAPAROSCOPY ABDOMEN DIAGNOSTIC     To R/O endometriosis  . [place] IMPEDANCE STUDY N/A 04/11/2017   Procedure: [place] IMPEDANCE STUDY;  Surgeon: Mauri Pole, MD;  Location: [facility];  Service: Endoscopy;  Laterality: N/A;  . SAVORY DILATION  06/25/2011   Procedure: SAVORY DILATION;  Surgeon: Lafayette Dragon, MD;  Location: [facility];  Service: Endoscopy;  Laterality: N/A;  . TONSILLECTOMY AND ADENOIDECTOMY  1962  . US ECHOCARDIOGRAPHY  08/1060   nl systolic function EF 69%, mild diastolic dysfunction   Family History  Problem Relation Age of Onset  . Mitral valve prolapse Father   . Diabetes Father   . Mitral valve prolapse Mother   . Hypertension Mother   . Stroke Mother 31       after valve surgery  . Parkinson's disease Brother   .  Other Brother        POST WAR TRAUMA  . Stroke Paternal Grandfather   . Heart failure Maternal Grandfather   . Cancer Neg Hx   . Colon cancer Neg Hx   . Stomach cancer Neg Hx   . Esophageal cancer Neg Hx   . Pancreatic cancer Neg Hx   . Liver disease Neg Hx       Review of Systems  Constitutional: Negative for fever.  Respiratory: Positive for cough. Negative for chest tightness, shortness of breath and wheezing.   Cardiovascular: Positive for leg swelling. Negative for chest pain and palpitations.  Gastrointestinal: Negative for abdominal pain, nausea and vomiting.  Genitourinary: Positive for frequency. Negative for difficulty urinating.  Musculoskeletal: Positive  for arthralgias.  Skin: Negative.   Neurological: Negative for dizziness, weakness, light-headedness and headaches.      BP 118/82 (BP Location: Left Arm, Patient Position: Sitting, Cuff Size: Normal)   Pulse 96   Temp 98.3 F (36.8 C) (Temporal)   Wt 81.6 kg   SpO2 96%   BMI 27.75 kg/m  Objective:   Physical Exam Constitutional:      Appearance: Normal appearance.  HENT:     Head: Normocephalic and atraumatic.  Neck:     Musculoskeletal: Normal range of motion.  Cardiovascular:     Rate and Rhythm: Normal rate and regular rhythm.     Pulses: Normal pulses.     Heart sounds: Normal heart sounds.  Pulmonary:     Effort: Pulmonary effort is normal. No respiratory distress.     Breath sounds: Normal breath sounds. No wheezing, rhonchi or rales.  Abdominal:     General: Bowel sounds are normal.     Palpations: Abdomen is soft.  Musculoskeletal:        General: No tenderness.     Right lower leg: Edema present.     Left lower leg: Edema present.     Comments: Mild edema  Skin:    General: Skin is warm and dry.  Neurological:     Mental Status: She is alert and oriented to person, place, and time.  Psychiatric:        Mood and Affect: Mood normal.        Behavior: Behavior normal.        Assessment & Plan:  1. Essential hypertension Stable, continue with current treatment - CBC with Differential - Comprehensive metabolic panel  2. Hyperlipidemia, unspecified hyperlipidemia type - CBC with Differential - Comprehensive metabolic panel  3. Controlled type 2 diabetes mellitus without complication, without long-term current use of insulin (HCC) Stable, continue with current condition - Hemoglobin A1c  4. Hypothyroidism, unspecified type Pt would like to try a generic version due lower price - levothyroxine (SYNTHROID) 100 MCG tablet; Take 1 tablet (100 mcg total) by mouth daily with breakfast.  Dispense: 90 tablet; Refill: 1

## 2018-12-06 NOTE — Progress Notes (Signed)
Subjective:    Patient ID: Jordan Patterson, female    DOB: 25-Mar-1940, 78 y.o.   MRN: [id number]  HPI Chief Complaint  Patient presents with  . Follow-up    Routine follow up- discuss meds and blood work     This is a 78 yo female who presents today to discuss medication and for labs.   Hypothyroidism- wants to try generic synthroid due to cost. Last TSH 02/27/2018 was 1.75.   Swelling in ankles, L>R- no SOB, normal urination. Notices more at night. No pain. Longstanding varicose veins.   Arthritis- increased pain in fingers and toes. Takes Tylenol PM with good sleep. Right hand feels like it is occasionally numb.   ROS- she denies chest pain, SOB, abdominal pain, nausea/vomiting, diarrhea/constipation.   Past Medical History:  Diagnosis Date  . Allergic rhinitis   . Arthritis    hands and knees - ?osteo  . Asthma   . Chronic headaches   . Compression fracture of T12 vertebra ([place]) 07/18/2014   S/p kyphoplasty by Dr Sherwood Gambler (08/2014)   . Depression    pt denies  . Diabetes type 2, controlled ([place]) 2004  . Environmental allergies    dust,mold,mildew  . Extrinsic asthma   . GERD (gastroesophageal reflux disease)    Olevia Perches) EGD - mild esophageal dysmotility and small hiatal hernia  . Hiatal hernia   . Hx of migraines   . Hyperlipidemia    mild, diet controlled  . Hypertension   . Hypothyroidism   . Macular degeneration   . Osteoporosis with fracture 07/2014   T -1.2 hip, T 0.2 spine, T12 compression fracture s/p kyphoplasty   Past Surgical History:  Procedure Laterality Date  . [place] STUDY N/A 04/11/2017   Procedure: [place] STUDY;  Surgeon: Mauri Pole, MD;  Location: [facility];  Service: Endoscopy;  Laterality: N/A;  . CATARACT EXTRACTION  2003   bilateral with lens implant  . COLONOSCOPY    . DEXA  05/2008   improved (initial DEXA 2007 - mild osteopenia)  . DILATION AND CURETTAGE OF UTERUS  1978  . ESOPHAGEAL MANOMETRY N/A 04/11/2017   Procedure: ESOPHAGEAL MANOMETRY (EM);  Surgeon: Mauri Pole, MD;  Location: [facility];  Service: Endoscopy;  Laterality: N/A;  . ESOPHAGOGASTRODUODENOSCOPY  06/25/2011   Procedure: ESOPHAGOGASTRODUODENOSCOPY (EGD);  Surgeon: Lafayette Dragon, MD;  Location: [facility];  Service: Endoscopy;  Laterality: N/A;  no Xray  . KYPHOPLASTY N/A 08/30/2014   Procedure: THORACIC TWELVE KYPHOPLASTY;  Surgeon: Jovita Gamma, MD  . LAPAROSCOPY ABDOMEN DIAGNOSTIC     To R/O endometriosis  . [place] IMPEDANCE STUDY N/A 04/11/2017   Procedure: [place] IMPEDANCE STUDY;  Surgeon: Mauri Pole, MD;  Location: [facility];  Service: Endoscopy;  Laterality: N/A;  . SAVORY DILATION  06/25/2011   Procedure: SAVORY DILATION;  Surgeon: Lafayette Dragon, MD;  Location: [facility];  Service: Endoscopy;  Laterality: N/A;  . TONSILLECTOMY AND ADENOIDECTOMY  1962  . US ECHOCARDIOGRAPHY  10/7671   nl systolic function EF 41%, mild diastolic dysfunction   Family History  Problem Relation Age of Onset  . Mitral valve prolapse Father   . Diabetes Father   . Mitral valve prolapse Mother   . Hypertension Mother   . Stroke Mother 60       after valve surgery  . Parkinson's disease Brother   . Other Brother        POST WAR TRAUMA  . Stroke Paternal  Grandfather   . Heart failure Maternal Grandfather   . Cancer Neg Hx   . Colon cancer Neg Hx   . Stomach cancer Neg Hx   . Esophageal cancer Neg Hx   . Pancreatic cancer Neg Hx   . Liver disease Neg Hx    Social History   Tobacco Use  . Smoking status: Never Smoker  . Smokeless tobacco: Never Used  Substance Use Topics  . Alcohol use: No    Alcohol/week: 0.0 standard drinks  . Drug use: No      Review of Systems Per HPI    Objective:   Physical Exam Vitals signs reviewed.  Constitutional:      General: She is not in acute distress.    Appearance: Normal appearance. She is normal weight. She is not ill-appearing, toxic-appearing or diaphoretic.  HENT:      Head: Normocephalic and atraumatic.  Eyes:     Conjunctiva/sclera: Conjunctivae normal.  Cardiovascular:     Rate and Rhythm: Normal rate and regular rhythm.     Heart sounds: Normal heart sounds.  Pulmonary:     Effort: Pulmonary effort is normal.     Breath sounds: Normal breath sounds.  Musculoskeletal:     Right lower leg: No edema.     Left lower leg: No edema.  Skin:    General: Skin is warm and dry.  Neurological:     Mental Status: She is alert and oriented to person, place, and time.  Psychiatric:        Mood and Affect: Mood normal.        Behavior: Behavior normal.        Thought Content: Thought content normal.        Judgment: Judgment normal.       BP 118/82 (BP Location: Left Arm, Patient Position: Sitting, Cuff Size: Normal)   Pulse 96   Temp 98.3 F (36.8 C) (Temporal)   Wt 179 lb 12.8 oz (81.6 kg)   SpO2 96%   BMI 27.75 kg/m  Wt Readings from Last 3 Encounters:  12/06/18 179 lb 12.8 oz (81.6 kg)  04/12/18 179 lb 9.6 oz (81.5 kg)  03/10/18 180 lb (81.6 kg)   BP Readings from Last 3 Encounters:  12/06/18 118/82  04/20/18 (!) 160/82  04/12/18 140/82       Assessment & Plan:  1. Essential hypertension - well controlled - CBC with Differential - Comprehensive metabolic panel  2. Hyperlipidemia, unspecified hyperlipidemia type - CBC with Differential - Comprehensive metabolic panel  3. Controlled type 2 diabetes mellitus without complication, without long-term current use of insulin (HCC) - Hemoglobin A1c  4. Hypothyroidism, unspecified type - will start generic levothyroxine and recheck TSH at follow up - levothyroxine (SYNTHROID) 100 MCG tablet; Take 1 tablet (100 mcg total) by mouth daily with breakfast.  Dispense: 90 tablet; Refill: 1  6. Leg swelling - no evidence of swelling today, will check labs and have her elevate while sitting and wear otc compression socks as needed Olean Ree, FNP-BC   Primary Care at [facility], [facility]  12/08/2018 8:14 AM

## 2018-12-07 LAB — CBC WITH DIFFERENTIAL/PLATELET
Basophils Absolute: 0.2 10*3/uL — ABNORMAL HIGH (ref 0.0–0.1)
Basophils Relative: 3.3 % — ABNORMAL HIGH (ref 0.0–3.0)
Eosinophils Absolute: 0.1 10*3/uL (ref 0.0–0.7)
Eosinophils Relative: 1.1 % (ref 0.0–5.0)
HCT: 36.8 % (ref 36.0–46.0)
Hemoglobin: 12.2 g/dL (ref 12.0–15.0)
Lymphocytes Relative: 35.8 % (ref 12.0–46.0)
Lymphs Abs: 2.6 10*3/uL (ref 0.7–4.0)
MCHC: 33.3 g/dL (ref 30.0–36.0)
MCV: 88.3 fl (ref 78.0–100.0)
Monocytes Absolute: 0.6 10*3/uL (ref 0.1–1.0)
Monocytes Relative: 7.7 % (ref 3.0–12.0)
Neutro Abs: 3.8 10*3/uL (ref 1.4–7.7)
Neutrophils Relative %: 52.1 % (ref 43.0–77.0)
Platelets: 221 10*3/uL (ref 150.0–400.0)
RBC: 4.16 Mil/uL (ref 3.87–5.11)
RDW: 13.8 % (ref 11.5–15.5)
WBC: 7.3 10*3/uL (ref 4.0–10.5)

## 2018-12-07 LAB — COMPREHENSIVE METABOLIC PANEL
ALT: 27 U/L (ref 0–35)
AST: 30 U/L (ref 0–37)
Albumin: 4.5 g/dL (ref 3.5–5.2)
Alkaline Phosphatase: 65 U/L (ref 39–117)
BUN: 10 mg/dL (ref 6–23)
CO2: 29 mEq/L (ref 19–32)
Calcium: 9.4 mg/dL (ref 8.4–10.5)
Chloride: 91 mEq/L — ABNORMAL LOW (ref 96–112)
Creatinine, Ser: 0.86 mg/dL (ref 0.40–1.20)
GFR: 63.79 mL/min (ref >60.00)
Glucose, Bld: 137 mg/dL — ABNORMAL HIGH (ref 70–99)
Potassium: 4.4 mEq/L (ref 3.5–5.1)
Sodium: 131 mEq/L — ABNORMAL LOW (ref 135–145)
Total Bilirubin: 0.5 mg/dL (ref 0.2–1.2)
Total Protein: 6.9 g/dL (ref 6.0–8.3)

## 2018-12-07 LAB — HEMOGLOBIN A1C: Hgb A1c MFr Bld: 7.7 % — ABNORMAL HIGH (ref 4.6–6.5)

## 2018-12-08 ENCOUNTER — Encounter: Payer: Self-pay | Admitting: Family Medicine

## 2018-12-14 ENCOUNTER — Other Ambulatory Visit: Payer: Self-pay

## 2018-12-14 ENCOUNTER — Ambulatory Visit: Payer: Medicare Other | Admitting: Internal Medicine

## 2018-12-14 ENCOUNTER — Ambulatory Visit (INDEPENDENT_AMBULATORY_CARE_PROVIDER_SITE_OTHER): Payer: Medicare Other

## 2018-12-14 ENCOUNTER — Encounter: Payer: Self-pay | Admitting: Internal Medicine

## 2018-12-14 VITALS — BP 130/80 | HR 93 | Temp 98.0°F | Ht 67.5 in | Wt 180.4 lb

## 2018-12-14 DIAGNOSIS — J4540 Moderate persistent asthma, uncomplicated: Secondary | ICD-10-CM | POA: Diagnosis not present

## 2018-12-14 DIAGNOSIS — R05 Cough: Secondary | ICD-10-CM

## 2018-12-14 DIAGNOSIS — R058 Other specified cough: Secondary | ICD-10-CM

## 2018-12-14 DIAGNOSIS — E871 Hypo-osmolality and hyponatremia: Secondary | ICD-10-CM | POA: Insufficient documentation

## 2018-12-14 DIAGNOSIS — K219 Gastro-esophageal reflux disease without esophagitis: Secondary | ICD-10-CM

## 2018-12-14 LAB — BASIC METABOLIC PANEL
BUN: 11 mg/dL (ref 6–23)
CO2: 31 mEq/L (ref 19–32)
Calcium: 9.2 mg/dL (ref 8.4–10.5)
Chloride: 91 mEq/L — ABNORMAL LOW (ref 96–112)
Creatinine, Ser: 0.8 mg/dL (ref 0.40–1.20)
GFR: 69.34 mL/min (ref >60.00)
Glucose, Bld: 163 mg/dL — ABNORMAL HIGH (ref 70–99)
Potassium: 3.2 mEq/L — ABNORMAL LOW (ref 3.5–5.1)
Sodium: 131 mEq/L — ABNORMAL LOW (ref 135–145)

## 2018-12-14 MED ORDER — BREZTRI AEROSPHERE 160-9-4.8 MCG/ACT IN AERO: 2.0000 | INHALATION_SPRAY | Freq: Two times a day (BID) | RESPIRATORY_TRACT | 0 refills | Status: DC

## 2018-12-14 NOTE — Assessment & Plan Note (Signed)
To continue reflux precautions and f/u w GI as planned.

## 2018-12-14 NOTE — Assessment & Plan Note (Signed)
We are asked to recheck her sodium level with BMET at this visit. I don't expect her to have enough lung disease to have SIADH on that basis, but we will update CXR.

## 2018-12-14 NOTE — Assessment & Plan Note (Signed)
Consider trial of gabapentin

## 2018-12-14 NOTE — Assessment & Plan Note (Signed)
Response to rescue inhaler c/w an asthma component. Trelegy didn't help but we will update CXR and try sample Breztrri. Some of this sounds like upper airway cough syndrome, for which we may want to try gabapentin.  Reflux component hard to measure, though GI noted symptoms c/w reflux.

## 2018-12-14 NOTE — Progress Notes (Signed)
HPI F never smoker followed for allergic rhinitis, asthma, complicated by GERD, hypothyroid, DM 2 FENO 12/29/15- 37-elevated, indicating allergic component to airway irritation. Office Spirometry 12/29/2015-moderately severe obstructive airways disease-FVC 1.99/60%, FEV1 1.29/51%, ratio 0.65, FEF 25-75 0.73/39%. Office Spirometry 08/17/2017- Moderate restriction.  FVC 2.1/66%, FEV1 1.6/65%, ratio 1.74, FEF 25-75% 1.3/65% CBC 08/18/2017-eos WNL 2.8, Allergy Profile 08/17/2017-elevated for dust mites, cat, pecan/hickory pollen, total IgE WNL 16 --------------------------------------------------------------------------------------------   04/12/2018- 78 year old female never smoker followed for Allergic rhinitis, Asthma, complicated by GERD, hypothyroid, DM 2 Dr Sherene Sires assessed for chronic cough- imp UACS and reflux, Rx'd clortrimeton for pnd, changed from Trelegy to Symbicort. Dr Darlyn Read- imp reflux- advised avoid all chocolate and caffeineincl decaf coffee and advised change from ARB Losartan. -----Asthma/Cough: Pt states she doing fairly well overall-SOB at times-with and without exertion Pro-air HFA, Norvasc, Chlor-Trimeton 4 mg, Tylenol PM nightly, Nexium 40, Diovan Singulair, We discussed her trial of suggestions from Dr. Sherene Sires for chronic cough.  She has limited choice among BP medicines so on Diovan still. Uses ginger cough drops (without methanol or sugar)-helpful.  Delsym cough syrup helpful.  Quit Symbicort which did not help.  Prefers occasional rescue inhaler, chewing gum ( avoid sugar substitutes). GI offered Nissen fundoplication, with no guarantee if would help cough. Wakes with heartburn- gets up and walks floor, takes menthol cough syrup for relief. Office Spirometry 08/17/2017 Moderate restriction.  FVC 2.1/66%, FEV1 1.6/65%, ratio 1.74, FEF 25-75% 1.3/65% CXR 08/18/2017- IMPRESSION: No acute cardiopulmonary disease. Lower thoracic vertebroplasty Unchanged. CBC 08/18/2017-eos WNL  2.8, Allergy Profile 08/17/2017-elevated for dust mites, cat, pecan/hickory pollen, total IgE WNL 16  12/14/2018- 78 year old female never smoker followed for Allergic rhinitis, Asthma/ Chronic Cough, complicated by GERD, hypothyroid, DM 2 -----pt states her breathing feels generally "tighter" and "more shallow" since LOV; pt currently on antihistimine, which she states helps Her PCP asked we get BMET to recheck hyponatremia Dr Nandigam/ GI recommended continued reflux control measures, but not Nissen at this time. Albuterol hfa, singulair,  Chronic dry cough felt starting in upper chest/ throat, worse at night in last 2 months. Some effect of position and effort. Little PN drip, little wheeze. Trelegy did not help, but using rescue inhaler every 6 hours does seem to help.  She is watching effect of tight clothing, incl her back brace. Had flu vax.  ROS-see HPI    += positive Constitutional:   No-   weight loss, night sweats, fevers, chills, fatigue, lassitude. HEENT:   No-  headaches, difficulty swallowing, tooth/dental problems, sore throat,       +sneezing, itching, ear ache, nasal congestion, post nasal drip,  CV:  No-   chest pain, orthopnea, PND, swelling in lower extremities, anasarca,                                                       dizziness, palpitations Resp: No-   shortness of breath with exertion or at rest.              No-   productive cough,  + non-productive cough,  No- coughing up of blood.              No-   change in color of mucus.+ wheezing.   Skin: No-   rash or lesions. GI:  +  heartburn, indigestion, No-abdominal pain, nausea,  vomiting, GU:  MS:  No-   joint pain or swelling.  + back pain Neuro-     nothing unusual Psych:  No- change in mood or affect. No depression or anxiety.  No memory loss.  OBJ- Physical Exam General- Alert, Oriented, Affect-appropriate, Distress- none acute Skin- rash-none, lesions- none, excoriation- none Lymphadenopathy- none Head-  atraumatic            Eyes- Gross vision intact, PERRLA, conjunctivae and secretions clear            Ears- clear            Nose- Clear, no-Septal dev, mucus, polyps, erosion, perforation             Throat- Mallampati II , mucosa clear , drainage+white, tonsils- atrophic Neck- flexible , trachea midline, no stridor , thyroid nl, carotid no bruit Chest - symmetrical excursion , unlabored           Heart/CV- RRR , no murmur , no gallop  , no rub, nl s1 s2                           - JVD- none , edema- none, stasis changes- none, varices- none           Lung- clear to P&A/ no crackles, wheeze- none, cough+ dry with deep breath , dullness-none, rub- none           Chest wall- + back brace Abd-  Br/ Gen/ Rectal- Not done, not indicated Extrem- cyanosis- none, clubbing, none, atrophy- none, strength- nl Neuro- grossly intact to observation

## 2018-12-14 NOTE — Patient Instructions (Signed)
Order- lab  BMET     Dx hyponatremia  Order- CXR    Dx asthma moderate persistent  Sample Breztri inhaler     Inhale 2 puffs, then rise mouth, twice daily You can still use your albuterol inhaler as before, if needed  Please call if we can help

## 2018-12-15 ENCOUNTER — Telehealth: Payer: Self-pay | Admitting: Internal Medicine

## 2018-12-15 MED ORDER — CHLORTHALIDONE 25 MG PO TABS: 12.5000 mg | ORAL_TABLET | Freq: Every day | ORAL | 6 refills | Status: DC

## 2018-12-15 NOTE — Telephone Encounter (Signed)
Pt returning call.  Accidentally disconnected call.  [phone]

## 2018-12-15 NOTE — Telephone Encounter (Signed)
Called and spoke with patient. Dr. Young recommendations given.  Understanding stated. Nothing further at this time. 

## 2018-12-15 NOTE — Telephone Encounter (Signed)
Jordan Patterson is safe for her. I specifically want to see if the ingredients work any better for her than what she had tried before, for cough, wheeze and shortness of breath.

## 2018-12-15 NOTE — Telephone Encounter (Signed)
Stop Chlorthalidone 25 mg daily and decrease the dose to 12.5 mg daily

## 2018-12-15 NOTE — Telephone Encounter (Signed)
Left message for pt to call back or reply through [place].

## 2018-12-15 NOTE — Telephone Encounter (Signed)
Called and spoke with Patient.  Patient stated she was given Breztri sample at [place] 12/15/18, with Dr Annamaria Boots. Patient stated she read insert in sample box and it advised not to use for asthma.  Patient has not started [place]. Patient was wanting to know if she should try something different or if Judithann Sauger is safe for asthma Patients.  Message routed to Dr. Annamaria Boots to advise  Allergies  Allergen Reactions  . Ace Inhibitors Cough  . Amlodipine Cough  . Crestor [Rosuvastatin] Other (See Comments)    myalgias  . Pregabalin Other (See Comments)    Unknown reaction  . Tegretol [Carbamazepine] Other (See Comments)    Dizziness, headache  . Wellbutrin [Bupropion Hcl] Other (See Comments)    Unknown reaction  . Cymbalta [Duloxetine Hcl] Palpitations and Other (See Comments)    headaches  . Lipitor [Atorvastatin] Cough   Current Outpatient Medications on File Prior to Visit  Medication Sig Dispense Refill  . albuterol (PROAIR HFA) 108 (90 Base) MCG/ACT inhaler Inhale 2 puffs into the lungs every 6 (six) hours as needed for wheezing or shortness of breath. 1 Inhaler 12  . Aloe Vera 25 MG CAPS Take 25 mg by mouth daily.    Marland Kitchen amLODipine (NORVASC) 5 MG tablet Take 1 tablet (5 mg total) by mouth daily. 90 tablet 3  . aspirin 81 MG tablet Take 81 mg by mouth daily.    . Budeson-Glycopyrrol-Formoterol (BREZTRI AEROSPHERE) 160-9-4.8 MCG/ACT AERO Inhale 2 puffs into the lungs 2 (two) times daily. 5.9 g 0  . chlorpheniramine (CHLOR-TRIMETON) 4 MG tablet Take 4 mg by mouth 2 (two) times daily as needed for allergies.    . chlorthalidone (HYGROTON) 25 MG tablet Take 1 tablet (25 mg total) by mouth daily. 30 tablet 11  . Cholecalciferol (VITAMIN D3) 1000 units CAPS Take 1 capsule (1,000 Units total) by mouth daily. 30 capsule   . DHA-Vitamin C-Lutein (EYE HEALTH FORMULA PO) Take by mouth daily.    . diphenhydramine-acetaminophen (ACETAMINOPHEN PM) 25-500 MG TABS tablet Take 1 tablet by mouth at bedtime as  needed.    Marland Kitchen esomeprazole (NEXIUM) 40 MG capsule TAKE 1 CAPSULE BY MOUTH EVERY DAY BEFORE BREAKFAST 90 capsule 2  . Garlic (GARLIQUE PO) Take 1 tablet by mouth daily.    Marland Kitchen levothyroxine (SYNTHROID) 100 MCG tablet Take 1 tablet (100 mcg total) by mouth daily with breakfast. 90 tablet 1  . MAGNESIUM PO Take by mouth.    . metFORMIN (GLUCOPHAGE) 500 MG tablet TAKE 1 TABLET BY MOUTH TWICE A DAY WITH MEALS 180 tablet 1  . montelukast (SINGULAIR) 10 MG tablet TAKE 1 TABLET BY MOUTH EVERYDAY AT BEDTIME 30 tablet 5  . Multiple Vitamins-Minerals (MULTIVITAMIN PO) Take 1 tablet by mouth daily.    . polyethylene glycol (MIRALAX / GLYCOLAX) packet Take 8.5 g by mouth once a week.     . Polyvinyl Alcohol-Povidone (REFRESH OP) Place 1 drop into both eyes daily.     . Saline (SIMPLY SALINE) 0.9 % AERS Place into the nose as needed.    Marland Kitchen Specialty Vitamins Products (CARDIOVASCULAR SUPPORT PO) Take by mouth daily.    . valsartan (DIOVAN) 320 MG tablet Take 1 tablet (320 mg total) by mouth daily. 30 tablet 11   No current facility-administered medications on file prior to visit.

## 2018-12-15 NOTE — Telephone Encounter (Signed)
ATC Patient.  LMTCB for Dr. Janee Morn recommendations.

## 2018-12-18 ENCOUNTER — Telehealth: Payer: Self-pay | Admitting: Internal Medicine

## 2018-12-18 NOTE — Telephone Encounter (Signed)
Notes recorded by Deneise Lever, MD on 12/15/2018 at 11:21 AM EDT  CXR- lungs clear with no active heart or lung problem seen. Incidental note of calcium deposits in aorta (atherosclerosis), and degenerative changes in spine. ---------------------------------------------------------------------------- Notes recorded by Deneise Lever, MD on 12/14/2018 at 4:38 PM EDT  Labs- sodium and potassium levels are low, which may be partly because blood sugar is high.  Results will be availablet to Clarene Reamer, FNP --------------------------------------------------------------------------- Spoke with pt. She is aware of results. Nothing further was needed.

## 2018-12-20 ENCOUNTER — Encounter: Payer: Self-pay | Admitting: Family Medicine

## 2018-12-21 ENCOUNTER — Telehealth: Payer: Self-pay | Admitting: *Deleted

## 2018-12-21 DIAGNOSIS — I10 Essential (primary) hypertension: Secondary | ICD-10-CM

## 2018-12-21 MED ORDER — CHLORTHALIDONE 25 MG PO TABS: 12.5000 mg | ORAL_TABLET | ORAL | 6 refills | Status: DC

## 2018-12-21 NOTE — Telephone Encounter (Signed)
-----   Message from Belva Crome, MD sent at 12/21/2018  1:04 PM EDT ----- Let the patient know I reviewed the labs from Clarene Reamer, [place] and recommend decreasing chlorthalidone to 12.5 mg on Monday, Wednesday, and Friday.  A basic metabolic panel should be performed in 2 to 3 weeks. A copy will be sent to Elby Beck, FNP

## 2018-12-21 NOTE — Telephone Encounter (Signed)
Spoke with pt and went over recommendations per Dr. Tamala Julian.  Pt will be in [place] for another appt on 11/20 and can come for labs that day.  Scheduled labs.  Pt verbalized understanding and was in agreement with plan.

## 2019-01-12 ENCOUNTER — Other Ambulatory Visit: Payer: Medicare Other | Admitting: *Deleted

## 2019-01-12 ENCOUNTER — Other Ambulatory Visit: Payer: Self-pay

## 2019-01-12 DIAGNOSIS — I10 Essential (primary) hypertension: Secondary | ICD-10-CM

## 2019-01-13 LAB — BASIC METABOLIC PANEL
BUN/Creatinine Ratio: 13 (ref 12–28)
BUN: 10 mg/dL (ref 8–27)
CO2: 26 mmol/L (ref 20–29)
Calcium: 9.3 mg/dL (ref 8.7–10.3)
Chloride: 93 mmol/L — ABNORMAL LOW (ref 96–106)
Creatinine, Ser: 0.75 mg/dL (ref 0.57–1.00)
GFR calc Af Amer: 88 mL/min/{1.73_m2} (ref >59)
GFR calc non Af Amer: 77 mL/min/{1.73_m2} (ref >59)
Glucose: 114 mg/dL — ABNORMAL HIGH (ref 65–99)
Potassium: 4 mmol/L (ref 3.5–5.2)
Sodium: 134 mmol/L (ref 134–144)

## 2019-02-09 ENCOUNTER — Other Ambulatory Visit: Payer: Self-pay | Admitting: Family Medicine

## 2019-02-12 ENCOUNTER — Other Ambulatory Visit: Payer: Self-pay | Admitting: Interventional Cardiology

## 2019-02-25 ENCOUNTER — Other Ambulatory Visit: Payer: Self-pay | Admitting: Internal Medicine

## 2019-03-05 ENCOUNTER — Other Ambulatory Visit: Payer: Self-pay | Admitting: Family Medicine

## 2019-03-05 ENCOUNTER — Ambulatory Visit: Payer: Medicare Other

## 2019-03-05 ENCOUNTER — Other Ambulatory Visit: Payer: Self-pay

## 2019-03-05 ENCOUNTER — Ambulatory Visit (INDEPENDENT_AMBULATORY_CARE_PROVIDER_SITE_OTHER): Payer: Medicare PPO

## 2019-03-05 ENCOUNTER — Other Ambulatory Visit (INDEPENDENT_AMBULATORY_CARE_PROVIDER_SITE_OTHER): Payer: Medicare PPO

## 2019-03-05 DIAGNOSIS — Z0000 Encounter for general adult medical examination without abnormal findings: Secondary | ICD-10-CM

## 2019-03-05 DIAGNOSIS — E7841 Elevated Lipoprotein(a): Secondary | ICD-10-CM

## 2019-03-05 DIAGNOSIS — E119 Type 2 diabetes mellitus without complications: Secondary | ICD-10-CM

## 2019-03-05 DIAGNOSIS — E039 Hypothyroidism, unspecified: Secondary | ICD-10-CM

## 2019-03-05 DIAGNOSIS — I10 Essential (primary) hypertension: Secondary | ICD-10-CM

## 2019-03-05 LAB — COMPREHENSIVE METABOLIC PANEL
ALT: 24 U/L (ref 0–35)
AST: 22 U/L (ref 0–37)
Albumin: 4.1 g/dL (ref 3.5–5.2)
Alkaline Phosphatase: 71 U/L (ref 39–117)
BUN: 11 mg/dL (ref 6–23)
CO2: 28 mEq/L (ref 19–32)
Calcium: 9 mg/dL (ref 8.4–10.5)
Chloride: 97 mEq/L (ref 96–112)
Creatinine, Ser: 0.81 mg/dL (ref 0.40–1.20)
GFR: 68.32 mL/min (ref >60.00)
Glucose, Bld: 149 mg/dL — ABNORMAL HIGH (ref 70–99)
Potassium: 4.2 mEq/L (ref 3.5–5.1)
Sodium: 133 mEq/L — ABNORMAL LOW (ref 135–145)
Total Bilirubin: 0.4 mg/dL (ref 0.2–1.2)
Total Protein: 6.9 g/dL (ref 6.0–8.3)

## 2019-03-05 LAB — CBC WITH DIFFERENTIAL/PLATELET
Basophils Absolute: 0 10*3/uL (ref 0.0–0.1)
Basophils Relative: 0.7 % (ref 0.0–3.0)
Eosinophils Absolute: 0.2 10*3/uL (ref 0.0–0.7)
Eosinophils Relative: 2.6 % (ref 0.0–5.0)
HCT: 36.8 % (ref 36.0–46.0)
Hemoglobin: 12.1 g/dL (ref 12.0–15.0)
Lymphocytes Relative: 44.9 % (ref 12.0–46.0)
Lymphs Abs: 2.7 10*3/uL (ref 0.7–4.0)
MCHC: 32.9 g/dL (ref 30.0–36.0)
MCV: 87.9 fl (ref 78.0–100.0)
Monocytes Absolute: 0.5 10*3/uL (ref 0.1–1.0)
Monocytes Relative: 7.4 % (ref 3.0–12.0)
Neutro Abs: 2.7 10*3/uL (ref 1.4–7.7)
Neutrophils Relative %: 44.4 % (ref 43.0–77.0)
Platelets: 224 10*3/uL (ref 150.0–400.0)
RBC: 4.18 Mil/uL (ref 3.87–5.11)
RDW: 14.3 % (ref 11.5–15.5)
WBC: 6.1 10*3/uL (ref 4.0–10.5)

## 2019-03-05 LAB — LIPID PANEL
Cholesterol: 233 mg/dL — ABNORMAL HIGH (ref 0–200)
HDL: 52.1 mg/dL (ref >39.00)
LDL Cholesterol: 156 mg/dL — ABNORMAL HIGH (ref 0–99)
NonHDL: 180.65
Total CHOL/HDL Ratio: 4
Triglycerides: 124 mg/dL (ref 0.0–149.0)
VLDL: 24.8 mg/dL (ref 0.0–40.0)

## 2019-03-05 LAB — VITAMIN B12: Vitamin B-12: 884 pg/mL (ref 211–911)

## 2019-03-05 LAB — TSH: TSH: 3.51 u[IU]/mL (ref 0.35–4.50)

## 2019-03-05 LAB — HEMOGLOBIN A1C: Hgb A1c MFr Bld: 7.3 % — ABNORMAL HIGH (ref 4.6–6.5)

## 2019-03-05 NOTE — Progress Notes (Signed)
c-met  

## 2019-03-05 NOTE — Telephone Encounter (Signed)
I have already had the first shot, waiting for the second.  I definitely believe Jordan Patterson can and should go ahead and get her covid vaccine.

## 2019-03-05 NOTE — Progress Notes (Addendum)
Subjective:   Jordan Patterson is a 79 y.o. female who presents for Medicare Annual (Subsequent) preventive examination.  Review of Systems: N/A   This visit is being conducted through telemedicine via telephone at the nurse health advisor's home address due to the COVID-19 pandemic. This patient has given me verbal consent via doximity to conduct this visit, patient states they are participating from their home address. Patient and myself are on the telephone call. There is no referral for this visit. Some vital signs may be absent or patient reported.    Patient identification: identified by name, DOB, and current address   Cardiac Risk Factors include: advanced age (>15men, >62 women);diabetes mellitus;hypertension;dyslipidemia     Objective:     Vitals: There were no vitals taken for this visit.  There is no height or weight on file to calculate BMI.  Advanced Directives 03/05/2019 02/27/2018 02/09/2017 02/09/2016 08/30/2014 06/25/2011  Does Patient Have a Medical Advance Directive? Yes Yes Yes Yes Yes Patient has advance directive, copy not in chart  Type of Advance Directive Healthcare Power of [place];Living will Healthcare Power of [place];Living will Healthcare Power of [place];Living will Healthcare Power of [place];Living will Healthcare Power of [place];Living will Healthcare Power of [place];Living will  Copy of Healthcare Power of Attorney in Chart? Yes - validated most recent copy scanned in chart (See row information) Yes - validated most recent copy scanned in chart (See row information) Yes Yes - -  Pre-existing out of facility DNR order (yellow form or pink MOST form) - - - - - Other (comment)    Tobacco Social History   Tobacco Use  Smoking Status Never Smoker  Smokeless Tobacco Never Used     Counseling given: Not Answered   Clinical Intake:  Pre-visit preparation completed: Yes  Pain : No/denies pain     Nutritional Risks: None Diabetes: Yes CBG  done?: No Did pt. bring in CBG monitor from home?: No  How often do you need to have someone help you when you read instructions, pamphlets, or other written materials from your doctor or pharmacy?: 1 - Never What is the last grade level you completed in school?: college  Interpreter Needed?: No  Information entered by :: Cjohnson, LPN  Past Medical History:  Diagnosis Date  . Allergic rhinitis   . Arthritis    hands and knees - ?osteo  . Asthma   . Chronic headaches   . Compression fracture of T12 vertebra (HCC) 07/18/2014   S/p kyphoplasty by Dr Newell Coral (08/2014)   . Depression    pt denies  . Diabetes type 2, controlled (HCC) 2004  . Environmental allergies    dust,mold,mildew  . Extrinsic asthma   . GERD (gastroesophageal reflux disease)    [name] EGD - mild esophageal dysmotility and small hiatal hernia  . Hiatal hernia   . Hx of migraines   . Hyperlipidemia    mild, diet controlled  . Hypertension   . Hypothyroidism   . Macular degeneration   . Osteoporosis with fracture 07/2014   T -1.2 hip, T 0.2 spine, T12 compression fracture s/p kyphoplasty   Past Surgical History:  Procedure Laterality Date  . 24 HOUR PH STUDY N/A 04/11/2017   Procedure: 24 HOUR PH STUDY;  Surgeon: Napoleon Form, MD;  Location: [facility];  Service: Endoscopy;  Laterality: N/A;  . CATARACT EXTRACTION  2003   bilateral with lens implant  . COLONOSCOPY    . DEXA  05/2008   improved (initial  DEXA 2007 - mild osteopenia)  . DILATION AND CURETTAGE OF UTERUS  1978  . ESOPHAGEAL MANOMETRY N/A 04/11/2017   Procedure: ESOPHAGEAL MANOMETRY (EM);  Surgeon: Mauri Pole, MD;  Location: [facility];  Service: Endoscopy;  Laterality: N/A;  . ESOPHAGOGASTRODUODENOSCOPY  06/25/2011   Procedure: ESOPHAGOGASTRODUODENOSCOPY (EGD);  Surgeon: Lafayette Dragon, MD;  Location: [facility];  Service: Endoscopy;  Laterality: N/A;  no Xray  . KYPHOPLASTY N/A 08/30/2014   Procedure: THORACIC TWELVE  KYPHOPLASTY;  Surgeon: Jovita Gamma, MD  . LAPAROSCOPY ABDOMEN DIAGNOSTIC     To R/O endometriosis  . [place] IMPEDANCE STUDY N/A 04/11/2017   Procedure: [place] IMPEDANCE STUDY;  Surgeon: Mauri Pole, MD;  Location: [facility];  Service: Endoscopy;  Laterality: N/A;  . SAVORY DILATION  06/25/2011   Procedure: SAVORY DILATION;  Surgeon: Lafayette Dragon, MD;  Location: [facility];  Service: Endoscopy;  Laterality: N/A;  . TONSILLECTOMY AND ADENOIDECTOMY  1962  . US ECHOCARDIOGRAPHY  05/5807   nl systolic function EF 98%, mild diastolic dysfunction   Family History  Problem Relation Age of Onset  . Mitral valve prolapse Father   . Diabetes Father   . Mitral valve prolapse Mother   . Hypertension Mother   . Stroke Mother 20       after valve surgery  . Parkinson's disease Brother   . Other Brother        POST WAR TRAUMA  . Stroke Paternal Grandfather   . Heart failure Maternal Grandfather   . Cancer Neg Hx   . Colon cancer Neg Hx   . Stomach cancer Neg Hx   . Esophageal cancer Neg Hx   . Pancreatic cancer Neg Hx   . Liver disease Neg Hx    Social History   Socioeconomic History  . Marital status: Widowed    Spouse name: Not on file  . Number of children: 3  . Years of education: Not on file  . Highest education level: Not on file  Occupational History  . Occupation: Retired  Tobacco Use  . Smoking status: Never Smoker  . Smokeless tobacco: Never Used  Substance and Sexual Activity  . Alcohol use: No    Alcohol/week: 0.0 standard drinks  . Drug use: No  . Sexual activity: Never  Other Topics Concern  . 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 Difficulty of Paying Living Expenses: Not hard at all  Food Insecurity: No Food Insecurity  . Worried About Charity fundraiser in the Last Year: Never true  . Ran Out of Food in the Last Year: Never true  Transportation Needs: No Transportation Needs  . Lack of Transportation (Medical): No  . Lack of Transportation (Non-Medical): No  Physical Activity: Inactive  . Days of Exercise per Week: 0 days  . Minutes of Exercise per Session: 0 min  Stress: No Stress Concern Present  . Feeling of Stress : Not at all  Social Connections:   . Frequency of Communication with Friends and Family: Not on file  . Frequency of Social Gatherings with  Friends and Family: Not on file  . Attends Religious Services: Not on file  . Active Member of Clubs or Organizations: Not on file  . Attends Banker Meetings: Not on file  . Marital Status: Not on file    Outpatient Encounter Medications as of 03/05/2019  Medication Sig  . albuterol (PROAIR HFA) 108 (90 Base) MCG/ACT inhaler Inhale 2 puffs into the lungs every 6 (six) hours as needed for wheezing or shortness of breath.  . Aloe Vera 25 MG CAPS Take 25 mg by mouth daily.  Marland Kitchen amLODipine (NORVASC) 5 MG tablet TAKE 1 TABLET BY MOUTH EVERY DAY  . aspirin 81 MG tablet Take 81 mg by mouth daily.  . Budeson-Glycopyrrol-Formoterol (BREZTRI AEROSPHERE) 160-9-4.8 MCG/ACT AERO Inhale 2 puffs into the lungs 2 (two) times daily.  . chlorpheniramine (CHLOR-TRIMETON) 4 MG tablet Take 4 mg by mouth 2 (two) times daily as needed for allergies.  . chlorthalidone (HYGROTON) 25 MG tablet Take 0.5 tablets (12.5 mg total) by mouth every Monday, Wednesday, and Friday.  . Cholecalciferol (VITAMIN D3) 1000 units CAPS Take 1 capsule (1,000 Units total) by mouth daily.  Marland Kitchen DHA-Vitamin C-Lutein (EYE HEALTH FORMULA PO) Take by mouth daily.  . diphenhydramine-acetaminophen (ACETAMINOPHEN PM) 25-500 MG TABS tablet Take 1 tablet by  mouth at bedtime as needed.  Marland Kitchen esomeprazole (NEXIUM) 40 MG capsule TAKE 1 CAPSULE BY MOUTH EVERY DAY BEFORE BREAKFAST  . Garlic (GARLIQUE PO) Take 1 tablet by mouth daily.  Marland Kitchen levothyroxine (SYNTHROID) 100 MCG tablet Take 1 tablet (100 mcg total) by mouth daily with breakfast.  . MAGNESIUM PO Take by mouth.  . metFORMIN (GLUCOPHAGE) 500 MG tablet TAKE 1 TABLET BY MOUTH TWICE A DAY WITH MEALS  . montelukast (SINGULAIR) 10 MG tablet TAKE 1 TABLET BY MOUTH EVERYDAY AT BEDTIME  . Multiple Vitamins-Minerals (MULTIVITAMIN PO) Take 1 tablet by mouth daily.  . polyethylene glycol (MIRALAX / GLYCOLAX) packet Take 8.5 g by mouth once a week.   . Polyvinyl Alcohol-Povidone (REFRESH OP) Place 1 drop into both eyes daily.   . Saline (SIMPLY SALINE) 0.9 % AERS Place into the nose as needed.  Marland Kitchen Specialty Vitamins Products (CARDIOVASCULAR SUPPORT PO) Take by mouth daily.  . valsartan (DIOVAN) 320 MG tablet Take 1 tablet (320 mg total) by mouth daily.   No facility-administered encounter medications on file as of 03/05/2019.    Activities of Daily Living In your present state of health, do you have any difficulty performing the following activities: 03/05/2019  Hearing? Y  Comment has hearing aids but does not wear them  Vision? Y  Comment has macular degeneration  Difficulty concentrating or making decisions? N  Walking or climbing stairs? N  Dressing or bathing? N  Doing errands, shopping? N  Preparing Food and eating ? N  Using the Toilet? N  In the past six months, have you accidently leaked urine? Y  Comment wears pads  Do you have problems with loss of bowel control? N  Managing your Medications? N  Managing your Finances? N  Housekeeping or managing your Housekeeping? N  Some recent data might be hidden    Patient Care Team: Emi Belfast, FNP as PCP - General (Nurse Practitioner) Lyn Records, MD as PCP - Cardiology (Cardiology) Eber Jones, MD as Referring Physician  (Ophthalmology) Waymon Budge, MD as Consulting Physician (Pulmonary Disease) Meylor, August Saucer, DC as Consulting Physician (Chiropractic Medicine) Richrd Humbles, DDS as Consulting Physician (Dentistry)    Assessment:  This is a routine wellness examination for Jordan Patterson.  Exercise Activities and Dietary recommendations Current Exercise Habits: The patient does not participate in regular exercise at present, Exercise limited by: None identified  Goals    . Increase physical activity     Weather permitting, I will continue to walk at least 30 minutes 5-6 days per week.     . Patient Stated     03/05/2019, I will maintain and continue medications as prescribed.       Fall Risk Fall Risk  03/05/2019 02/27/2018 02/09/2017 02/09/2016 02/07/2015  Falls in the past year? 0 0 No No Yes  Number falls in past yr: 0 - - - 1  Injury with Fall? 0 - - - Yes  Comment - - - - T12 compression fx  Risk for fall due to : Medication side effect - - - -  Follow up Falls evaluation completed;Falls prevention discussed - - - -   Is the patient's home free of loose throw rugs in walkways, pet beds, electrical cords, etc?   yes      Grab bars in the bathroom? yes      Handrails on the stairs?   yes      Adequate lighting?   yes  Timed Get Up and Go performed: N/A  Depression Screen PHQ 2/9 Scores 03/05/2019 02/27/2018 02/09/2017 02/09/2016  PHQ - 2 Score 0 0 0 0  PHQ- 9 Score 0 0 0 -     Cognitive Function MMSE - Mini Mental State Exam 03/05/2019 02/27/2018 02/09/2017 02/09/2016  Orientation to time 5 5 5 5   Orientation to Place 5 5 5 5   Registration 3 3 3 3   Attention/ Calculation 5 0 0 0  Recall 3 3 3 3   Language- name 2 objects - 0 0 0  Language- repeat 1 1 1 1   Language- follow 3 step command - 3 3 3   Language- read & follow direction - 0 0 0  Write a sentence - 0 0 0  Copy design - 0 0 0  Total score - 20 20 20   Mini Cog  Mini-Cog screen was completed. Maximum score is 22. A value of 0 denotes  this part of the MMSE was not completed or the patient failed this part of the Mini-Cog screening.       Immunization History  Administered Date(s) Administered  . Influenza Whole 11/23/2011, 12/06/2012  . Influenza, High Dose Seasonal PF 11/23/2018  . Influenza,inj,Quad PF,6+ Mos 11/06/2014, 10/21/2015, 11/23/2016, 11/23/2017, 11/09/2018  . Influenza-Unspecified 11/22/2013  . Pneumococcal Conjugate-13 05/29/2015  . Pneumococcal Polysaccharide-23 10/23/2005  . Td 01/14/2012    Qualifies for Shingles Vaccine? Yes  Screening Tests Health Maintenance  Topic Date Due  . FOOT EXAM  03/11/2019  . HEMOGLOBIN A1C  06/06/2019  . OPHTHALMOLOGY EXAM  08/23/2019  . TETANUS/TDAP  01/13/2022  . INFLUENZA VACCINE  Completed  . DEXA SCAN  Completed  . PNA vac Low Risk Adult  Completed    Cancer Screenings: Lung: Low Dose CT Chest recommended if Age 60-80 years, 30 pack-year currently smoking OR have quit w/in 15years. Patient does not qualify. Breast:  Up to date on Mammogram? Yes, no longer required   Up to date of Bone Density/Dexa? Yes, completed 01/26/2017 Colorectal: no longer required  Additional Screenings:  Hepatitis C Screening: N/A     Plan:   Patient will maintain and continue medications as prescribed.    I have personally reviewed and noted the following in  the patient's chart:   . Medical and social history . Use of alcohol, tobacco or illicit drugs  . Current medications and supplements . Functional ability and status . Nutritional status . Physical activity . Advanced directives . List of other physicians . Hospitalizations, surgeries, and ER visits in previous 12 months . Vitals . Screenings to include cognitive, depression, and falls . Referrals and appointments  In addition, I have reviewed and discussed with patient certain preventive protocols, quality metrics, and best practice recommendations. A written personalized care plan for preventive services as  well as general preventive health recommendations were provided to patient.     Janalyn ShyJohnson, Eleri Ruben, LPN  [id number]/60/45401/12/2019

## 2019-03-05 NOTE — Telephone Encounter (Signed)
Mychart message received by pt which I have posted below:  To: [facility]    From: Jordan Patterson    Created: 03/03/2019 9:43 AM     *-*-*This message was handled on 03/05/2019 9:18 AM by Pearlie Oyster, Waldemar Siegel P*-*-*  Dr. Maple Hudson, you told me the last time I was in your office that I should NOT get the Covid-19 vaccine until I was absolutely sure it was approved by the FDA.   I need your opinion about receiving the vaccine now. I do not feel totally comfortable because it was rushed through the approval process so quickly - that is why I am asking your opinion.   I am doing well and have not had the virus (neither have any of my family).   Thank you for your help, Jordan Patterson. Jordan Patterson     Dr. Maple Hudson, please advise on this for pt. Thanks!

## 2019-03-05 NOTE — Patient Instructions (Signed)
Jordan Patterson , Thank you for taking time to come for your Medicare Wellness Visit. I appreciate your ongoing commitment to your health goals. Please review the following plan we discussed and let me know if I can assist you in the future.   Screening recommendations/referrals: Colonoscopy: no longer required Mammogram: no longer required Bone Density: Up to date, completed 01/26/2017 Recommended yearly ophthalmology/optometry visit for glaucoma screening and checkup Recommended yearly dental visit for hygiene and checkup  Vaccinations: Influenza vaccine: Up to date, completed 11/23/2018 Pneumococcal vaccine: Completed series Tdap vaccine: Up to date, completed 01/14/2012 Shingles vaccine: discussed    Advanced directives: copy in chart per patient  Conditions/risks identified: diabetes, hypertension, hyperlipidemia  Next appointment: 03/12/2019 @ 9:30 am    Preventive Care 65 Years and Older, Female Preventive care refers to lifestyle choices and visits with your health care provider that can promote health and wellness. What does preventive care include?  A yearly physical exam. This is also called an annual well check.  Dental exams once or twice a year.  Routine eye exams. Ask your health care provider how often you should have your eyes checked.  Personal lifestyle choices, including:  Daily care of your teeth and gums.  Regular physical activity.  Eating a healthy diet.  Avoiding tobacco and drug use.  Limiting alcohol use.  Practicing safe sex.  Taking low-dose aspirin every day.  Taking vitamin and mineral supplements as recommended by your health care provider. What happens during an annual well check? The services and screenings done by your health care provider during your annual well check will depend on your age, overall health, lifestyle risk factors, and family history of disease. Counseling  Your health care provider may ask you questions about  your:  Alcohol use.  Tobacco use.  Drug use.  Emotional well-being.  Home and relationship well-being.  Sexual activity.  Eating habits.  History of falls.  Memory and ability to understand (cognition).  Work and work Astronomer.  Reproductive health. Screening  You may have the following tests or measurements:  Height, weight, and BMI.  Blood pressure.  Lipid and cholesterol levels. These may be checked every 5 years, or more frequently if you are over 88 years old.  Skin check.  Lung cancer screening. You may have this screening every year starting at age 22 if you have a 30-pack-year history of smoking and currently smoke or have quit within the past 15 years.  Fecal occult blood test (FOBT) of the stool. You may have this test every year starting at age 9.  Flexible sigmoidoscopy or colonoscopy. You may have a sigmoidoscopy every 5 years or a colonoscopy every 10 years starting at age 51.  Hepatitis C blood test.  Hepatitis B blood test.  Sexually transmitted disease (STD) testing.  Diabetes screening. This is done by checking your blood sugar (glucose) after you have not eaten for a while (fasting). You may have this done every 1-3 years.  Bone density scan. This is done to screen for osteoporosis. You may have this done starting at age 51.  Mammogram. This may be done every 1-2 years. Talk to your health care provider about how often you should have regular mammograms. Talk with your health care provider about your test results, treatment options, and if necessary, the need for more tests. Vaccines  Your health care provider may recommend certain vaccines, such as:  Influenza vaccine. This is recommended every year.  Tetanus, diphtheria, and acellular pertussis (Tdap, Td)  vaccine. You may need a Td booster every 10 years.  Zoster vaccine. You may need this after age 58.  Pneumococcal 13-valent conjugate (PCV13) vaccine. One dose is recommended  after age 34.  Pneumococcal polysaccharide (PPSV23) vaccine. One dose is recommended after age 22. Talk to your health care provider about which screenings and vaccines you need and how often you need them. This information is not intended to replace advice given to you by your health care provider. Make sure you discuss any questions you have with your health care provider. Document Released: 03/07/2015 Document Revised: 10/29/2015 Document Reviewed: 12/10/2014 Elsevier Interactive Patient Education  2017 [place] Prevention in the Home Falls can cause injuries. They can happen to people of all ages. There are many things you can do to make your home safe and to help prevent falls. What can I do on the outside of my home?  Regularly fix the edges of walkways and driveways and fix any cracks.  Remove anything that might make you trip as you walk through a door, such as a raised step or threshold.  Trim any bushes or trees on the path to your home.  Use bright outdoor lighting.  Clear any walking paths of anything that might make someone trip, such as rocks or tools.  Regularly check to see if handrails are loose or broken. Make sure that both sides of any steps have handrails.  Any raised decks and porches should have guardrails on the edges.  Have any leaves, snow, or ice cleared regularly.  Use sand or salt on walking paths during winter.  Clean up any spills in your garage right away. This includes oil or grease spills. What can I do in the bathroom?  Use night lights.  Install grab bars by the toilet and in the tub and shower. Do not use towel bars as grab bars.  Use non-skid mats or decals in the tub or shower.  If you need to sit down in the shower, use a plastic, non-slip stool.  Keep the floor dry. Clean up any water that spills on the floor as soon as it happens.  Remove soap buildup in the tub or shower regularly.  Attach bath mats securely with  double-sided non-slip rug tape.  Do not have throw rugs and other things on the floor that can make you trip. What can I do in the bedroom?  Use night lights.  Make sure that you have a light by your bed that is easy to reach.  Do not use any sheets or blankets that are too big for your bed. They should not hang down onto the floor.  Have a firm chair that has side arms. You can use this for support while you get dressed.  Do not have throw rugs and other things on the floor that can make you trip. What can I do in the kitchen?  Clean up any spills right away.  Avoid walking on wet floors.  Keep items that you use a lot in easy-to-reach places.  If you need to reach something above you, use a strong step stool that has a grab bar.  Keep electrical cords out of the way.  Do not use floor polish or wax that makes floors slippery. If you must use wax, use non-skid floor wax.  Do not have throw rugs and other things on the floor that can make you trip. What can I do with my stairs?  Do not leave  any items on the stairs.  Make sure that there are handrails on both sides of the stairs and use them. Fix handrails that are broken or loose. Make sure that handrails are as long as the stairways.  Check any carpeting to make sure that it is firmly attached to the stairs. Fix any carpet that is loose or worn.  Avoid having throw rugs at the top or bottom of the stairs. If you do have throw rugs, attach them to the floor with carpet tape.  Make sure that you have a light switch at the top of the stairs and the bottom of the stairs. If you do not have them, ask someone to add them for you. What else can I do to help prevent falls?  Wear shoes that:  Do not have high heels.  Have rubber bottoms.  Are comfortable and fit you well.  Are closed at the toe. Do not wear sandals.  If you use a stepladder:  Make sure that it is fully opened. Do not climb a closed stepladder.  Make  sure that both sides of the stepladder are locked into place.  Ask someone to hold it for you, if possible.  Clearly mark and make sure that you can see:  Any grab bars or handrails.  First and last steps.  Where the edge of each step is.  Use tools that help you move around (mobility aids) if they are needed. These include:  Canes.  Walkers.  Scooters.  Crutches.  Turn on the lights when you go into a dark area. Replace any light bulbs as soon as they burn out.  Set up your furniture so you have a clear path. Avoid moving your furniture around.  If any of your floors are uneven, fix them.  If there are any pets around you, be aware of where they are.  Review your medicines with your doctor. Some medicines can make you feel dizzy. This can increase your chance of falling. Ask your doctor what other things that you can do to help prevent falls. This information is not intended to replace advice given to you by your health care provider. Make sure you discuss any questions you have with your health care provider. Document Released: 12/05/2008 Document Revised: 07/17/2015 Document Reviewed: 03/15/2014 Elsevier Interactive Patient Education  2017 [employer].

## 2019-03-05 NOTE — Progress Notes (Signed)
PCP notes:  Health Maintenance: No gaps noted   Abnormal Screenings: none   Patient concerns: Questions about safety of Metformin   Nurse concerns: none   Next PCP appt: 03/12/2019 @ 9:30 am

## 2019-03-06 ENCOUNTER — Other Ambulatory Visit: Payer: Medicare PPO

## 2019-03-06 DIAGNOSIS — E119 Type 2 diabetes mellitus without complications: Secondary | ICD-10-CM

## 2019-03-06 DIAGNOSIS — I10 Essential (primary) hypertension: Secondary | ICD-10-CM

## 2019-03-06 LAB — MICROALBUMIN / CREATININE URINE RATIO
Creatinine,U: 60 mg/dL
Microalb Creat Ratio: 2.7 mg/g (ref 0.0–30.0)
Microalb, Ur: 1.6 mg/dL (ref 0.0–1.9)

## 2019-03-12 ENCOUNTER — Encounter: Payer: Self-pay | Admitting: Family Medicine

## 2019-03-12 ENCOUNTER — Other Ambulatory Visit: Payer: Self-pay

## 2019-03-12 ENCOUNTER — Ambulatory Visit (INDEPENDENT_AMBULATORY_CARE_PROVIDER_SITE_OTHER): Payer: Medicare PPO | Admitting: Family Medicine

## 2019-03-12 VITALS — BP 132/82 | HR 84 | Temp 97.4°F | Ht 67.5 in | Wt 181.0 lb

## 2019-03-12 DIAGNOSIS — E119 Type 2 diabetes mellitus without complications: Secondary | ICD-10-CM | POA: Diagnosis not present

## 2019-03-12 DIAGNOSIS — M79642 Pain in left hand: Secondary | ICD-10-CM | POA: Diagnosis not present

## 2019-03-12 DIAGNOSIS — G8929 Other chronic pain: Secondary | ICD-10-CM | POA: Insufficient documentation

## 2019-03-12 DIAGNOSIS — M545 Low back pain, unspecified: Secondary | ICD-10-CM | POA: Insufficient documentation

## 2019-03-12 DIAGNOSIS — Z0000 Encounter for general adult medical examination without abnormal findings: Secondary | ICD-10-CM | POA: Diagnosis not present

## 2019-03-12 DIAGNOSIS — I10 Essential (primary) hypertension: Secondary | ICD-10-CM

## 2019-03-12 DIAGNOSIS — M79641 Pain in right hand: Secondary | ICD-10-CM | POA: Diagnosis not present

## 2019-03-12 DIAGNOSIS — E7841 Elevated Lipoprotein(a): Secondary | ICD-10-CM

## 2019-03-12 NOTE — Progress Notes (Signed)
Subjective:    Patient ID: Jordan Patterson, female    DOB: May 08, 1940, 79 y.o.   MRN: [id number]  HPI This is a 79 yo female who presents today for annual exam. She had her Medicare AWV 03/05/19.   Last CPE- 03/10/2018 Mammo/dexa- 01/26/2017 Pap- screening age exceeded Colonoscopy- screening age exceeded Tdap- 01/14/2012 Flu- annual Eye-annual Dental-regular Exercise-stays fairly active with household chores.  Arthritis in hands and feet- taking Tylenol pm at night, aloe pills. Pain in sides of feet, right foot with toes curling.   Right side of rib catches. Seems to come more often.  Resolves after couple of minutes of being very still.  Chronic back pain-she does wear a back brace during the day.  Some improvement.  Chronic cough-continues to occasionally bother her.  She continues to follow-up with Dr. Maple Hudson, pulmonary.  Past Medical History:  Diagnosis Date  . Allergic rhinitis   . Arthritis    hands and knees - ?osteo  . Asthma   . Chronic headaches   . Compression fracture of T12 vertebra (HCC) 07/18/2014   S/p kyphoplasty by Dr Newell Coral (08/2014)   . Depression    pt denies  . Diabetes type 2, controlled (HCC) 2004  . Environmental allergies    dust,mold,mildew  . Extrinsic asthma   . GERD (gastroesophageal reflux disease)    [name] EGD - mild esophageal dysmotility and small hiatal hernia  . Hiatal hernia   . Hx of migraines   . Hyperlipidemia    mild, diet controlled  . Hypertension   . Hypothyroidism   . Macular degeneration   . Osteoporosis with fracture 07/2014   T -1.2 hip, T 0.2 spine, T12 compression fracture s/p kyphoplasty   Past Surgical History:  Procedure Laterality Date  . 24 HOUR PH STUDY N/A 04/11/2017   Procedure: 24 HOUR PH STUDY;  Surgeon: Napoleon Form, MD;  Location: [facility];  Service: Endoscopy;  Laterality: N/A;  . CATARACT EXTRACTION  2003   bilateral with lens implant  . COLONOSCOPY    . DEXA  05/2008   improved (initial  DEXA 2007 - mild osteopenia)  . DILATION AND CURETTAGE OF UTERUS  1978  . ESOPHAGEAL MANOMETRY N/A 04/11/2017   Procedure: ESOPHAGEAL MANOMETRY (EM);  Surgeon: Napoleon Form, MD;  Location: [facility];  Service: Endoscopy;  Laterality: N/A;  . ESOPHAGOGASTRODUODENOSCOPY  06/25/2011   Procedure: ESOPHAGOGASTRODUODENOSCOPY (EGD);  Surgeon: Hart Carwin, MD;  Location: [facility];  Service: Endoscopy;  Laterality: N/A;  no Xray  . KYPHOPLASTY N/A 08/30/2014   Procedure: THORACIC TWELVE KYPHOPLASTY;  Surgeon: [name], MD  . LAPAROSCOPY ABDOMEN DIAGNOSTIC     To R/O endometriosis  . PH IMPEDANCE STUDY N/A 04/11/2017   Procedure: PH IMPEDANCE STUDY;  Surgeon: Napoleon Form, MD;  Location: [facility];  Service: Endoscopy;  Laterality: N/A;  . SAVORY DILATION  06/25/2011   Procedure: SAVORY DILATION;  Surgeon: Hart Carwin, MD;  Location: [facility];  Service: Endoscopy;  Laterality: N/A;  . TONSILLECTOMY AND ADENOIDECTOMY  1962  . US ECHOCARDIOGRAPHY  10/2014   nl systolic function EF 55%, mild diastolic dysfunction   Family History  Problem Relation Age of Onset  . Mitral valve prolapse Father   . Diabetes Father   . Mitral valve prolapse Mother   . Hypertension Mother   . Stroke Mother 42       after valve surgery  . Parkinson's disease Brother   . Other Brother  POST WAR TRAUMA  . Stroke Paternal Grandfather   . Heart failure Maternal Grandfather   . Cancer Neg Hx   . Colon cancer Neg Hx   . Stomach cancer Neg Hx   . Esophageal cancer Neg Hx   . Pancreatic cancer Neg Hx   . Liver disease Neg Hx    Social History   Tobacco Use  . Smoking status: Never Smoker  . Smokeless tobacco: Never Used  Substance Use Topics  . Alcohol use: No    Alcohol/week: 0.0 standard drinks  . Drug use: No      Review of Systems  Constitutional: Negative.   HENT: Negative.   Eyes: Negative.   Respiratory: Positive for cough (chronic). Negative for sputum  production, shortness of breath and wheezing.   Cardiovascular: Positive for leg swelling (occasional at end of day, resolves over night).  Gastrointestinal: Positive for constipation (takes Miralax weekly prn, can not take more frequently, causes urgency and diarrhea).  Genitourinary:       Stress incontinence.   Musculoskeletal:       Per HPI  Skin: Negative.   Neurological: Negative.   Psychiatric/Behavioral: Negative.    Review of Systems  Constitutional: Negative.   HENT: Negative.   Eyes: Negative.   Respiratory: Positive for cough (chronic). Negative for sputum production, shortness of breath and wheezing.   Cardiovascular: Positive for leg swelling (occasional at end of day, resolves over night).  Gastrointestinal: Positive for constipation (takes Miralax weekly prn, can not take more frequently, causes urgency and diarrhea).  Genitourinary:       Stress incontinence.   Musculoskeletal:       Per HPI  Skin: Negative.   Neurological: Negative.   Endo/Heme/Allergies: Negative.   Psychiatric/Behavioral: Negative.        Objective:   Physical Exam Vitals reviewed.  Constitutional:      General: She is not in acute distress.    Appearance: Normal appearance. She is normal weight. She is not ill-appearing, toxic-appearing or diaphoretic.  HENT:     Head: Normocephalic and atraumatic.     Right Ear: Tympanic membrane, ear canal and external ear normal.     Left Ear: Tympanic membrane, ear canal and external ear normal.  Eyes:     Conjunctiva/sclera: Conjunctivae normal.  Cardiovascular:     Rate and Rhythm: Normal rate and regular rhythm.     Heart sounds: Normal heart sounds.  Pulmonary:     Effort: Pulmonary effort is normal.     Breath sounds: Normal breath sounds.  Chest:     Comments: Patient declined breast exam Abdominal:     General: Abdomen is flat. Bowel sounds are normal.     Palpations: Abdomen is soft.  Musculoskeletal:     Cervical back: Normal  range of motion and neck supple. No rigidity or tenderness.     Right lower leg: Edema (trace) present.     Left lower leg: Edema (trace) present.  Lymphadenopathy:     Cervical: No cervical adenopathy.  Skin:    General: Skin is warm and dry.  Neurological:     Mental Status: She is alert and oriented to person, place, and time.  Psychiatric:        Mood and Affect: Mood normal.        Behavior: Behavior normal.        Thought Content: Thought content normal.        Judgment: Judgment normal.       BP  132/82   Pulse 84   Temp (!) 97.4 F (36.3 C) (Skin)   Ht 5' 7.5" (1.715 m)   Wt 181 lb (82.1 kg)   SpO2 97%   BMI 27.93 kg/m  Wt Readings from Last 3 Encounters:  03/12/19 181 lb (82.1 kg)  12/14/18 180 lb 6.4 oz (81.8 kg)  12/06/18 179 lb 12.8 oz (81.6 kg)   Depression screen [facility] 2/9 03/12/2019 03/05/2019 02/27/2018 02/09/2017 02/09/2016  Decreased Interest 0 0 0 0 0  Down, Depressed, Hopeless 0 0 0 0 0  PHQ - 2 Score 0 0 0 0 0  Altered sleeping 0 0 0 0 -  Tired, decreased energy 0 0 0 0 -  Change in appetite 0 0 0 0 -  Feeling bad or failure about yourself  0 0 0 0 -  Trouble concentrating 1 0 0 0 -  Moving slowly or fidgety/restless 0 0 0 0 -  Suicidal thoughts 0 0 0 0 -  PHQ-9 Score 1 0 0 0 -  Difficult doing work/chores Not difficult at all Not difficult at all Not difficult at all Not difficult at all -  Some recent data might be hidden   GAD 7 : Generalized Anxiety Score 03/12/2019  Nervous, Anxious, on Edge 0  Control/stop worrying 0  Worry too much - different things 0  Trouble relaxing 0  Restless 0  Easily annoyed or irritable 1  Afraid - awful might happen 0  Total GAD 7 Score 1  Anxiety Difficulty Not difficult at all        Assessment & Plan:  1. Annual physical exam - Discussed and encouraged healthy lifestyle choices- adequate sleep, regular exercise, stress management and healthy food choices.   2. Chronic midline low back pain without  sciatica -Encouraged activity as tolerated and removal of brace several times a day for deep breathing exercises  3. Controlled type 2 diabetes mellitus without complication, without long-term current use of insulin (HCC) -Improved hemoglobin A1c, continue current medications  4. Essential hypertension -Well-controlled  5. Elevated lipoprotein(a) -LDL not at goal.  Patient has had side effects with multiple statins.  She has had improvement of hemoglobin A1c and decreased LDL and total cholesterol over the last 6 months.  We will continue to monitor.  6. Pain in both hands -Likely osteoarthritis.  Discussed trying acetaminophen during the day as well as topical diclofenac gel over-the-counter.  Can also use heat/ice as needed.  -Follow-up in 6 months  This visit occurred during the SARS-CoV-2 public health emergency.  Safety protocols were in place, including screening questions prior to the visit, additional usage of staff PPE, and extensive cleaning of exam room while observing appropriate contact time as indicated for disinfecting solutions.      Olean Ree, FNP-BC  [facility], [facility]  03/12/2019 5:45 PM

## 2019-03-12 NOTE — Patient Instructions (Addendum)
Good to see you today  Can try day time Tylenol (without PM) and over the counter Voltaren Gel. Can get an appointment with [facility].   Health Maintenance After Age 79 After age 29, you are at a higher risk for certain long-term diseases and infections as well as injuries from falls. Falls are a major cause of broken bones and head injuries in people who are older than age 18. Getting regular preventive care can help to keep you healthy and well. Preventive care includes getting regular testing and making lifestyle changes as recommended by your health care provider. Talk with your health care provider about:  Which screenings and tests you should have. A screening is a test that checks for a disease when you have no symptoms.  A diet and exercise plan that is right for you. What should I know about screenings and tests to prevent falls? Screening and testing are the best ways to find a health problem early. Early diagnosis and treatment give you the best chance of managing medical conditions that are common after age 66. Certain conditions and lifestyle choices may make you more likely to have a fall. Your health care provider may recommend:  Regular vision checks. Poor vision and conditions such as cataracts can make you more likely to have a fall. If you wear glasses, make sure to get your prescription updated if your vision changes.  Medicine review. Work with your health care provider to regularly review all of the medicines you are taking, including over-the-counter medicines. Ask your health care provider about any side effects that may make you more likely to have a fall. Tell your health care provider if any medicines that you take make you feel dizzy or sleepy.  Osteoporosis screening. Osteoporosis is a condition that causes the bones to get weaker. This can make the bones weak and cause them to break more easily.  Blood pressure screening. Blood pressure changes and medicines to  control blood pressure can make you feel dizzy.  Strength and balance checks. Your health care provider may recommend certain tests to check your strength and balance while standing, walking, or changing positions.  Foot health exam. Foot pain and numbness, as well as not wearing proper footwear, can make you more likely to have a fall.  Depression screening. You may be more likely to have a fall if you have a fear of falling, feel emotionally low, or feel unable to do activities that you used to do.  Alcohol use screening. Using too much alcohol can affect your balance and may make you more likely to have a fall. What actions can I take to lower my risk of falls? General instructions  Talk with your health care provider about your risks for falling. Tell your health care provider if: ? You fall. Be sure to tell your health care provider about all falls, even ones that seem minor. ? You feel dizzy, sleepy, or off-balance.  Take over-the-counter and prescription medicines only as told by your health care provider. These include any supplements.  Eat a healthy diet and maintain a healthy weight. A healthy diet includes low-fat dairy products, low-fat (lean) meats, and fiber from whole grains, beans, and lots of fruits and vegetables. Home safety  Remove any tripping hazards, such as rugs, cords, and clutter.  Install safety equipment such as grab bars in bathrooms and safety rails on stairs.  Keep rooms and walkways well-lit. Activity   Follow a regular exercise program to stay  fit. This will help you maintain your balance. Ask your health care provider what types of exercise are appropriate for you.  If you need a cane or walker, use it as recommended by your health care provider.  Wear supportive shoes that have nonskid soles. Lifestyle  Do not drink alcohol if your health care provider tells you not to drink.  If you drink alcohol, limit how much you have: ? 0-1 drink a day  for women. ? 0-2 drinks a day for men.  Be aware of how much alcohol is in your drink. In the [place] one drink equals one typical bottle of beer (12 oz), one-half glass of wine (5 oz), or one shot of hard liquor (1 oz).  Do not use any products that contain nicotine or tobacco, such as cigarettes and e-cigarettes. If you need help quitting, ask your health care provider. Summary  Having a healthy lifestyle and getting preventive care can help to protect your health and wellness after age 17.  Screening and testing are the best way to find a health problem early and help you avoid having a fall. Early diagnosis and treatment give you the best chance for managing medical conditions that are more common for people who are older than age 14.  Falls are a major cause of broken bones and head injuries in people who are older than age 33. Take precautions to prevent a fall at home.  Work with your health care provider to learn what changes you can make to improve your health and wellness and to prevent falls. This information is not intended to replace advice given to you by your health care provider. Make sure you discuss any questions you have with your health care provider. Document Revised: 06/01/2018 Document Reviewed: 12/22/2016 Elsevier Patient Education  2020 [employer].

## 2019-03-27 ENCOUNTER — Other Ambulatory Visit: Payer: Self-pay | Admitting: Internal Medicine

## 2019-03-29 ENCOUNTER — Telehealth: Payer: Self-pay | Admitting: Gastroenterology

## 2019-03-29 ENCOUNTER — Other Ambulatory Visit: Payer: Self-pay | Admitting: Gastroenterology

## 2019-03-29 NOTE — Telephone Encounter (Signed)
I called Jordan Patterson to let her know I have sent in her Nexium and we made her an appointment for med refills as the last time she was seen was 06/2017.

## 2019-04-12 ENCOUNTER — Ambulatory Visit: Payer: Medicare Other | Admitting: Internal Medicine

## 2019-04-16 ENCOUNTER — Other Ambulatory Visit: Payer: Self-pay | Admitting: Interventional Cardiology

## 2019-04-30 ENCOUNTER — Other Ambulatory Visit: Payer: Self-pay | Admitting: Interventional Cardiology

## 2019-05-02 DIAGNOSIS — N8111 Cystocele, midline: Secondary | ICD-10-CM | POA: Diagnosis not present

## 2019-05-02 DIAGNOSIS — N813 Complete uterovaginal prolapse: Secondary | ICD-10-CM | POA: Diagnosis not present

## 2019-05-02 DIAGNOSIS — N398 Other specified disorders of urinary system: Secondary | ICD-10-CM | POA: Diagnosis not present

## 2019-05-02 DIAGNOSIS — N3946 Mixed incontinence: Secondary | ICD-10-CM | POA: Diagnosis not present

## 2019-05-09 ENCOUNTER — Ambulatory Visit: Payer: Medicare PPO | Admitting: Gastroenterology

## 2019-05-09 ENCOUNTER — Encounter: Payer: Self-pay | Admitting: Gastroenterology

## 2019-05-09 ENCOUNTER — Other Ambulatory Visit: Payer: Self-pay

## 2019-05-09 VITALS — BP 128/80 | HR 86 | Ht 67.5 in | Wt 181.0 lb

## 2019-05-09 DIAGNOSIS — K219 Gastro-esophageal reflux disease without esophagitis: Secondary | ICD-10-CM | POA: Diagnosis not present

## 2019-05-09 DIAGNOSIS — R053 Chronic cough: Secondary | ICD-10-CM

## 2019-05-09 DIAGNOSIS — R05 Cough: Secondary | ICD-10-CM

## 2019-05-09 MED ORDER — ESOMEPRAZOLE MAGNESIUM 40 MG PO CPDR: DELAYED_RELEASE_CAPSULE | ORAL | 6 refills | Status: DC

## 2019-05-09 MED ORDER — FAMOTIDINE 20 MG PO TABS: 20.0000 mg | ORAL_TABLET | Freq: Every day | ORAL | 6 refills | Status: DC

## 2019-05-09 NOTE — Progress Notes (Signed)
RONI FRIBERG    [id number]    May 19, 1940  Primary Care Physician:Gessner, Binnie Rail, FNP  Referring Physician: Emi Belfast, FNP [address] [place],  [place] [address]   Chief complaint:  GERD  HPI: 79 year old female here for follow-up visit for chronic GERD and cough   Her symptoms are stable on esomeprazole 40 mg daily.  No heartburn, dysphagia, odynophagia, nausea, vomiting, loss of appetite or weight loss.  Denies any change in bowel habits, melena or blood per rectum.  EGD Jun 25, 2011: Irregular Z line with biopsies negative for intestinal metaplasia or Barrett's esophagus.  Status post dilation with Maloney to 17 mm.  Otherwise normal exam  Upper GI series November 2012: Small sliding-type hiatal hernia with moderate inducible gastroesophageal reflux.  Mild smooth narrowing at the GE junction likely a lower esophageal mucosal ring.  30 mm barium pill passed into stomach without any difficulty.  Normal appearance of stomach and duodenum.  Esophageal manometry February 2019: Normal  24-hour pH impedance on PPI and H2 blocker February 2019: Good acid suppression with DeMeester score 7.2, very few gastroesophageal reflux events , were predominantly weekly acid reflux.  Good symptom correlation for regurgitation with reflux events but no correlation for cough.  Negative Cologuard April 18, 2015  Outpatient Encounter Medications as of 05/09/2019  Medication Sig  . albuterol (VENTOLIN HFA) 108 (90 Base) MCG/ACT inhaler TAKE 2 PUFFS BY MOUTH EVERY 6 HOURS AS NEEDED FOR WHEEZE OR SHORTNESS OF BREATH  . Aloe Vera 25 MG CAPS Take 25 mg by mouth daily.  Marland Kitchen amLODipine (NORVASC) 5 MG tablet Take 1 tablet (5 mg total) by mouth daily. Please schedule annual appt with Dr. Katrinka Blazing for refills. [phone]. 1st attempt  . aspirin 81 MG tablet Take 81 mg by mouth daily.  . chlorpheniramine (CHLOR-TRIMETON) 4 MG tablet Take 4 mg by mouth 2 (two) times daily as needed  for allergies.  . chlorthalidone (HYGROTON) 25 MG tablet Take 0.5 tablets (12.5 mg total) by mouth every Monday, Wednesday, and Friday.  . Cholecalciferol (VITAMIN D3) 1000 units CAPS Take 1 capsule (1,000 Units total) by mouth daily.  . diphenhydramine-acetaminophen (ACETAMINOPHEN PM) 25-500 MG TABS tablet Take 1 tablet by mouth at bedtime as needed.  Marland Kitchen esomeprazole (NEXIUM) 40 MG capsule TAKE 1 CAPSULE BY MOUTH EVERY DAY BEFORE BREAKFAST  . Garlic (GARLIQUE PO) Take 1 tablet by mouth daily.  Marland Kitchen levothyroxine (SYNTHROID) 100 MCG tablet Take 1 tablet (100 mcg total) by mouth daily with breakfast.  . MAGNESIUM PO Take by mouth.  . metFORMIN (GLUCOPHAGE) 500 MG tablet TAKE 1 TABLET BY MOUTH TWICE A DAY WITH MEALS  . montelukast (SINGULAIR) 10 MG tablet TAKE 1 TABLET BY MOUTH EVERYDAY AT BEDTIME  . Multiple Vitamins-Minerals (MULTIVITAMIN PO) Take 1 tablet by mouth daily.  . polyethylene glycol (MIRALAX / GLYCOLAX) packet Take 8.5 g by mouth once a week.   . Polyvinyl Alcohol-Povidone (REFRESH OP) Place 1 drop into both eyes daily.   . Saline (SIMPLY SALINE) 0.9 % AERS Place into the nose as needed.  Marland Kitchen Specialty Vitamins Products (CARDIOVASCULAR SUPPORT PO) Take by mouth daily.  . valsartan (DIOVAN) 320 MG tablet TAKE 1 TABLET BY MOUTH EVERY DAY  . [DISCONTINUED] esomeprazole (NEXIUM) 40 MG capsule TAKE 1 CAPSULE BY MOUTH EVERY DAY BEFORE BREAKFAST  . famotidine (PEPCID) 20 MG tablet Take 1 tablet (20 mg total) by mouth at bedtime.   No facility-administered  encounter medications on file as of 05/09/2019.    Allergies as of 05/09/2019 - Review Complete 03/12/2019  Allergen Reaction Noted  . Ace inhibitors Cough 05/19/2011  . Amlodipine Cough 05/19/2011  . Crestor [rosuvastatin] Other (See Comments) 01/24/2014  . Pregabalin Other (See Comments) 05/19/2011  . Tegretol [carbamazepine] Other (See Comments) 05/19/2011  . Wellbutrin [bupropion hcl] Other (See Comments) 05/19/2011  . Cymbalta  [duloxetine hcl] Palpitations and Other (See Comments) 05/19/2011  . Lipitor [atorvastatin] Cough 03/26/2016    Past Medical History:  Diagnosis Date  . Allergic rhinitis   . Arthritis    hands and knees - ?osteo  . Asthma   . Chronic headaches   . Compression fracture of T12 vertebra (HCC) 07/18/2014   S/p kyphoplasty by Dr Newell Coral (08/2014)   . Depression    pt denies  . Diabetes type 2, controlled (HCC) 2004  . Environmental allergies    dust,mold,mildew  . Extrinsic asthma   . GERD (gastroesophageal reflux disease)    [name] EGD - mild esophageal dysmotility and small hiatal hernia  . Hiatal hernia   . Hx of migraines   . Hyperlipidemia    mild, diet controlled  . Hypertension   . Hypothyroidism   . Macular degeneration   . Osteoporosis with fracture 07/2014   T -1.2 hip, T 0.2 spine, T12 compression fracture s/p kyphoplasty    Past Surgical History:  Procedure Laterality Date  . 24 HOUR PH STUDY N/A 04/11/2017   Procedure: 24 HOUR PH STUDY;  Surgeon: Napoleon Form, MD;  Location: [facility];  Service: Endoscopy;  Laterality: N/A;  . CATARACT EXTRACTION  2003   bilateral with lens implant  . COLONOSCOPY    . DEXA  05/2008   improved (initial DEXA 2007 - mild osteopenia)  . DILATION AND CURETTAGE OF UTERUS  1978  . ESOPHAGEAL MANOMETRY N/A 04/11/2017   Procedure: ESOPHAGEAL MANOMETRY (EM);  Surgeon: Napoleon Form, MD;  Location: [facility];  Service: Endoscopy;  Laterality: N/A;  . ESOPHAGOGASTRODUODENOSCOPY  06/25/2011   Procedure: ESOPHAGOGASTRODUODENOSCOPY (EGD);  Surgeon: Hart Carwin, MD;  Location: [facility];  Service: Endoscopy;  Laterality: N/A;  no Xray  . KYPHOPLASTY N/A 08/30/2014   Procedure: THORACIC TWELVE KYPHOPLASTY;  Surgeon: [name], MD  . LAPAROSCOPY ABDOMEN DIAGNOSTIC     To R/O endometriosis  . PH IMPEDANCE STUDY N/A 04/11/2017   Procedure: PH IMPEDANCE STUDY;  Surgeon: Napoleon Form, MD;  Location: [facility];   Service: Endoscopy;  Laterality: N/A;  . SAVORY DILATION  06/25/2011   Procedure: SAVORY DILATION;  Surgeon: Hart Carwin, MD;  Location: [facility];  Service: Endoscopy;  Laterality: N/A;  . TONSILLECTOMY AND ADENOIDECTOMY  1962  . US ECHOCARDIOGRAPHY  10/2014   nl systolic function EF 55%, mild diastolic dysfunction    Family History  Problem Relation Age of Onset  . Mitral valve prolapse Father   . Diabetes Father   . Mitral valve prolapse Mother   . Hypertension Mother   . Stroke Mother 50       after valve surgery  . Parkinson's disease Brother   . Other Brother        POST WAR TRAUMA  . Stroke Paternal Grandfather   . Heart failure Maternal Grandfather   . Cancer Neg Hx   . Colon cancer Neg Hx   . Stomach cancer Neg Hx   . Esophageal cancer Neg Hx   . Pancreatic cancer Neg Hx   . Liver  disease Neg Hx     Social History   Socioeconomic History  . Marital status: Widowed    Spouse name: Not on file  . Number of children: 3  . Years of education: Not on file  . Highest education level: Not on file  Occupational History  . Occupation: Retired  Tobacco Use  . Smoking status: Never Smoker  . Smokeless tobacco: Never Used  Substance and Sexual Activity  . Alcohol use: No    Alcohol/week: 0.0 standard drinks  . Drug use: No  . Sexual activity: Never  Other Topics Concern  . 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 Difficulty of Paying Living Expenses: Not hard at all  Food Insecurity: No Food Insecurity  . Worried About Programme researcher, broadcasting/film/video in the Last Year: Never true  . Ran Out of Food in the Last Year:  Never true  Transportation Needs: No Transportation Needs  . Lack of Transportation (Medical): No  . Lack of Transportation (Non-Medical): No  Physical Activity: Inactive  . Days of Exercise per Week: 0 days  . Minutes of Exercise per Session: 0 min  Stress: No Stress Concern Present  . Feeling of Stress : Not at all  Social Connections:   . Frequency of Communication with Friends and Family:   . Frequency of Social Gatherings with Friends and Family:   . Attends Religious Services:   . Active Member of Clubs or Organizations:   . Attends Banker Meetings:   Marland Kitchen Marital Status:   Intimate Partner Violence: Not At Risk  . Fear of Current or Ex-Partner: No  . Emotionally Abused: No  . Physically Abused: No  . Sexually Abused: No      Review of systems:  All other review of systems negative except as mentioned in the HPI.   Physical Exam: Vitals:   05/09/19 1325  BP: 128/80  Pulse: 86   Body mass index is 27.93 kg/m. Gen:      No acute distress  Neuro: alert and oriented x 3 Psych: normal mood and affect  Data Reviewed:  Reviewed labs, radiology imaging, old records and pertinent past GI work up   Assessment and Plan/Recommendations:  79 year old female with chronic GERD and cough for follow-up visit  Overall her symptoms are stable  Continue Nexium daily, 30 minutes before breakfast Add Pepcid 20 mg at bedtime as needed Antireflux measures  Patient is worried about getting Covid vaccination given her comorbidities [asthma, diabetes].  Explained to patient the overall benefit is for greater than potential side effects and especially she is at significant risk for worse outcome if she gets COVID-19 infection. Encouraged her to get vaccination.  This visit required 30 minutes of patient care (this includes precharting, chart review, review of results, face-to-face time used for counseling as well as treatment plan and follow-up. The patient was  provided an opportunity to ask questions and all were answered. The patient agreed with the plan and demonstrated an understanding of the instructions.  Iona Beard , MD    CC: Olean Ree  B, FNP

## 2019-05-09 NOTE — Patient Instructions (Signed)
If you are age 79 or older, your body mass index should be between 23-30. Your Body mass index is 27.93 kg/m. If this is out of the aforementioned range listed, please consider follow up with your Primary Care Provider.  If you are age 70 or younger, your body mass index should be between 19-25. Your Body mass index is 27.93 kg/m. If this is out of the aformentioned range listed, please consider follow up with your Primary Care Provider.   We have sent the following medications to your pharmacy for you to pick up at your convenience: Nexium Pepcid  Follow up in 1-2 years.  Thank you for choosing me and [facility].  Marsa Aris, MD

## 2019-05-11 ENCOUNTER — Other Ambulatory Visit: Payer: Self-pay | Admitting: Interventional Cardiology

## 2019-05-17 ENCOUNTER — Ambulatory Visit: Payer: Medicare PPO | Admitting: Internal Medicine

## 2019-05-17 ENCOUNTER — Encounter: Payer: Self-pay | Admitting: Internal Medicine

## 2019-05-17 ENCOUNTER — Other Ambulatory Visit: Payer: Self-pay

## 2019-05-17 DIAGNOSIS — N819 Female genital prolapse, unspecified: Secondary | ICD-10-CM

## 2019-05-17 DIAGNOSIS — K219 Gastro-esophageal reflux disease without esophagitis: Secondary | ICD-10-CM | POA: Diagnosis not present

## 2019-05-17 DIAGNOSIS — J4540 Moderate persistent asthma, uncomplicated: Secondary | ICD-10-CM | POA: Diagnosis not present

## 2019-05-17 NOTE — Patient Instructions (Signed)
Sample x 2 Breztri inhaler  Inhale 2 puffs, then rinse mouth, twice daily every day  You can still use your albuterol rescue inhaler as needed, as before  I recommend you get whichever Covid shot you can, as soon as you can  Please call if we can help

## 2019-05-17 NOTE — Progress Notes (Signed)
HPI F never smoker followed for allergic rhinitis, asthma, complicated by GERD, hypothyroid, DM 2 FENO 12/29/15- 37-elevated, indicating allergic component to airway irritation. Office Spirometry 12/29/2015-moderately severe obstructive airways disease-FVC 1.99/60%, FEV1 1.29/51%, ratio 0.65, FEF 25-75 0.73/39%. Office Spirometry 08/17/2017- Moderate restriction.  FVC 2.1/66%, FEV1 1.6/65%, ratio 1.74, FEF 25-75% 1.3/65% CBC 08/18/2017-eos WNL 2.8, Allergy Profile 08/17/2017-elevated for dust mites, cat, pecan/hickory pollen, total IgE WNL 16 --------------------------------------------------------------------------------------------    12/14/2018- 79 year old female never smoker followed for Allergic rhinitis, Asthma/ Chronic Cough, complicated by GERD, hypothyroid, DM 2 -----pt states her breathing feels generally "tighter" and "more shallow" since LOV; pt currently on antihistimine, which she states helps Her PCP asked we get BMET to recheck hyponatremia Dr Nandigam/ GI recommended continued reflux control measures, but not Nissen at this time. Albuterol hfa, singulair,  Chronic dry cough felt starting in upper chest/ throat, worse at night in last 2 months. Some effect of position and effort. Little PN drip, little wheeze. Trelegy did not help, but using rescue inhaler every 6 hours does seem to help.  She is watching effect of tight clothing, incl her back brace. Had flu vax.  05/17/19- 79 year old female never smoker followed for Allergic rhinitis, Asthma/ Chronic Cough, complicated by GERD/ HH (GI recs no Nissen at this time), hypothyroid, DM 2 -----F/u moderate persistent asthma without complication.  Daughter here taking notes. No Covax yet- wanted to discuss safety.Quesstions answered Trelegy no help. Much cough still, mostly dry. ProAir hfa helps best, used 2-3x/ day.  Aware of reflux at times- blamed in past for her cough.  Considering pelvic floor surgery.   CXR  12/14/2018- IMPRESSION: 1.  No radiographic evidence of acute cardiopulmonary disease. 2. Aortic atherosclerosis.  ROS-see HPI    += positive Constitutional:   No-   weight loss, night sweats, fevers, chills, fatigue, lassitude. HEENT:   No-  headaches, difficulty swallowing, tooth/dental problems, sore throat,       +sneezing, itching, ear ache, nasal congestion, post nasal drip,  CV:  No-   chest pain, orthopnea, PND, swelling in lower extremities, anasarca,                                                       dizziness, palpitations Resp: No-   shortness of breath with exertion or at rest.              No-   productive cough,  + non-productive cough,  No- coughing up of blood.              No-   change in color of mucus.+ wheezing.   Skin: No-   rash or lesions. GI:  +  heartburn, indigestion, No-abdominal pain, nausea, vomiting, GU:  MS:  No-   joint pain or swelling.  + back pain Neuro-     nothing unusual Psych:  No- change in mood or affect. No depression or anxiety.  No memory loss.  OBJ- Physical Exam General- Alert, Oriented, Affect-appropriate, Distress- none acute Skin- rash-none, lesions- none, excoriation- none Lymphadenopathy- none Head- atraumatic            Eyes- Gross vision intact, PERRLA, conjunctivae and secretions clear            Ears- clear            Nose- Clear, no-Septal  dev, mucus, polyps, erosion, perforation             Throat- Mallampati II , mucosa clear , drainage+white, tonsils- atrophic Neck- flexible , trachea midline, no stridor , thyroid nl, carotid no bruit Chest - symmetrical excursion , unlabored           Heart/CV- RRR , no murmur , no gallop  , no rub, nl s1 s2                           - JVD- none , edema- none, stasis changes- none, varices- none           Lung- clear to P&A/ no crackles, wheeze- none, cough+ wheezy with deep breath , dullness-none, rub- none           Chest wall- + back brace Abd-  Br/ Gen/ Rectal- Not done, not  indicated Extrem- cyanosis- none, clubbing, none, atrophy- none, strength- nl Neuro- grossly intact to observation

## 2019-05-18 NOTE — Assessment & Plan Note (Signed)
Not sure why cough improves with ProAir, but not Trelegy.  Plan- try again with a different maintenance controller- sample [facility]

## 2019-05-18 NOTE — Assessment & Plan Note (Signed)
We discussed surgical considerations. She would need close supervision by anesthesiology and would probably do best with intubation to minimize aspiration risk during surgery. If she is uncomfortable enough to consider surgery, she is not having an acute respiratory exacerbation now, and she isn't getting any younger.

## 2019-05-18 NOTE — Assessment & Plan Note (Signed)
Emphasis on continued reflux precautions, since she is aware of occasional reflux event.

## 2019-05-22 DIAGNOSIS — N812 Incomplete uterovaginal prolapse: Secondary | ICD-10-CM | POA: Diagnosis not present

## 2019-05-22 DIAGNOSIS — I10 Essential (primary) hypertension: Secondary | ICD-10-CM | POA: Diagnosis not present

## 2019-05-22 DIAGNOSIS — N1339 Other hydronephrosis: Secondary | ICD-10-CM | POA: Diagnosis not present

## 2019-05-22 DIAGNOSIS — N398 Other specified disorders of urinary system: Secondary | ICD-10-CM | POA: Diagnosis not present

## 2019-05-22 DIAGNOSIS — R82998 Other abnormal findings in urine: Secondary | ICD-10-CM | POA: Diagnosis not present

## 2019-05-22 DIAGNOSIS — N8111 Cystocele, midline: Secondary | ICD-10-CM | POA: Diagnosis not present

## 2019-05-22 DIAGNOSIS — N393 Stress incontinence (female) (male): Secondary | ICD-10-CM | POA: Diagnosis not present

## 2019-05-24 ENCOUNTER — Other Ambulatory Visit: Payer: Self-pay | Admitting: Interventional Cardiology

## 2019-05-31 ENCOUNTER — Other Ambulatory Visit: Payer: Self-pay | Admitting: Interventional Cardiology

## 2019-07-02 NOTE — Progress Notes (Signed)
Cardiology Office Note:    Date:  07/03/2019   ID:  Jordan Patterson, DOB 07-17-1940, MRN [id number]  PCP:  Emi Belfast, FNP  Cardiologist:  Lesleigh Noe, MD   Referring MD: Emi Belfast, FNP   Chief Complaint  Patient presents with  . Hypertension    History of Present Illness:    Jordan Patterson is a 79 y.o. female with a hx of hypertension, hyperlipidemia, and DM II.  Feels well.  Wants to know if she can get off of some of the medications that she is on.  She is not short of breath, lightheaded, but does have some trace swelling in her lower extremities.  Past Medical History:  Diagnosis Date  . Allergic rhinitis   . Arthritis    hands and knees - ?osteo  . Asthma   . Chronic headaches   . Compression fracture of T12 vertebra (HCC) 07/18/2014   S/p kyphoplasty by Dr Newell Coral (08/2014)   . Depression    pt denies  . Diabetes type 2, controlled (HCC) 2004  . Environmental allergies    dust,mold,mildew  . Extrinsic asthma   . GERD (gastroesophageal reflux disease)    [name] EGD - mild esophageal dysmotility and small hiatal hernia  . Hiatal hernia   . Hx of migraines   . Hyperlipidemia    mild, diet controlled  . Hypertension   . Hypothyroidism   . Macular degeneration   . Osteoporosis with fracture 07/2014   T -1.2 hip, T 0.2 spine, T12 compression fracture s/p kyphoplasty    Past Surgical History:  Procedure Laterality Date  . 24 HOUR PH STUDY N/A 04/11/2017   Procedure: 24 HOUR PH STUDY;  Surgeon: Napoleon Form, MD;  Location: [facility];  Service: Endoscopy;  Laterality: N/A;  . CATARACT EXTRACTION  2003   bilateral with lens implant  . COLONOSCOPY    . DEXA  05/2008   improved (initial DEXA 2007 - mild osteopenia)  . DILATION AND CURETTAGE OF UTERUS  1978  . ESOPHAGEAL MANOMETRY N/A 04/11/2017   Procedure: ESOPHAGEAL MANOMETRY (EM);  Surgeon: Napoleon Form, MD;  Location: [facility];  Service: Endoscopy;  Laterality: N/A;  .  ESOPHAGOGASTRODUODENOSCOPY  06/25/2011   Procedure: ESOPHAGOGASTRODUODENOSCOPY (EGD);  Surgeon: Hart Carwin, MD;  Location: [facility];  Service: Endoscopy;  Laterality: N/A;  no Xray  . KYPHOPLASTY N/A 08/30/2014   Procedure: THORACIC TWELVE KYPHOPLASTY;  Surgeon: [name], MD  . LAPAROSCOPY ABDOMEN DIAGNOSTIC     To R/O endometriosis  . PH IMPEDANCE STUDY N/A 04/11/2017   Procedure: PH IMPEDANCE STUDY;  Surgeon: Napoleon Form, MD;  Location: [facility];  Service: Endoscopy;  Laterality: N/A;  . SAVORY DILATION  06/25/2011   Procedure: SAVORY DILATION;  Surgeon: Hart Carwin, MD;  Location: [facility];  Service: Endoscopy;  Laterality: N/A;  . TONSILLECTOMY AND ADENOIDECTOMY  1962  . US ECHOCARDIOGRAPHY  10/2014   nl systolic function EF 55%, mild diastolic dysfunction    Current Medications: Current Meds  Medication Sig  . albuterol (VENTOLIN HFA) 108 (90 Base) MCG/ACT inhaler TAKE 2 PUFFS BY MOUTH EVERY 6 HOURS AS NEEDED FOR WHEEZE OR SHORTNESS OF BREATH  . Aloe Vera 25 MG CAPS Take 25 mg by mouth daily.  Marland Kitchen amLODipine (NORVASC) 5 MG tablet Take 1 tablet (5 mg total) by mouth daily. Please make overdue appt with Dr. Katrinka Blazing before anymore refills. 2nd attempt  . aspirin 81 MG tablet Take  81 mg by mouth daily.  . chlorpheniramine (CHLOR-TRIMETON) 4 MG tablet Take 4 mg by mouth 2 (two) times daily as needed for allergies.  . chlorthalidone (HYGROTON) 25 MG tablet Take 0.5 mg by mouth daily.  . Cholecalciferol (VITAMIN D3) 1000 units CAPS Take 1 capsule (1,000 Units total) by mouth daily.  . diphenhydramine-acetaminophen (ACETAMINOPHEN PM) 25-500 MG TABS tablet Take 1 tablet by mouth at bedtime as needed.  Marland Kitchen esomeprazole (NEXIUM) 40 MG capsule TAKE 1 CAPSULE BY MOUTH EVERY DAY BEFORE BREAKFAST  . famotidine (PEPCID) 20 MG tablet Take 1 tablet (20 mg total) by mouth at bedtime.  . Garlic (GARLIQUE PO) Take 1 tablet by mouth daily.  Marland Kitchen levothyroxine (SYNTHROID) 100 MCG tablet  Take 1 tablet (100 mcg total) by mouth daily with breakfast.  . MAGNESIUM PO Take by mouth.  . metFORMIN (GLUCOPHAGE) 500 MG tablet TAKE 1 TABLET BY MOUTH TWICE A DAY WITH MEALS  . montelukast (SINGULAIR) 10 MG tablet TAKE 1 TABLET BY MOUTH EVERYDAY AT BEDTIME  . Multiple Vitamins-Minerals (MULTIVITAMIN PO) Take 1 tablet by mouth daily.  . polyethylene glycol (MIRALAX / GLYCOLAX) packet Take 8.5 g by mouth once a week.   . Polyvinyl Alcohol-Povidone (REFRESH OP) Place 1 drop into both eyes daily.   . Saline (SIMPLY SALINE) 0.9 % AERS Place into the nose as needed.  Marland Kitchen Specialty Vitamins Products (CARDIOVASCULAR SUPPORT PO) Take by mouth daily.  . valsartan (DIOVAN) 320 MG tablet Take 1 tablet (320 mg total) by mouth daily. Please keep upcoming appt for further appt for further refills     Allergies:   Ace inhibitors, Amlodipine, Crestor [rosuvastatin], Pregabalin, Tegretol [carbamazepine], Wellbutrin [bupropion hcl], Cymbalta [duloxetine hcl], and Lipitor [atorvastatin]   Social History   Socioeconomic History  . Marital status: Widowed    Spouse name: Not on file  . Number of children: 3  . Years of education: Not on file  . Highest education level: Not on file  Occupational History  . Occupation: Retired  Tobacco Use  . Smoking status: Never Smoker  . Smokeless tobacco: Never Used  Substance and Sexual Activity  . Alcohol use: No    Alcohol/week: 0.0 standard drinks  . Drug use: No  . Sexual activity: Never  Other Topics Concern  . 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 Difficulty of Paying Living Expenses: Not hard  at all  Food Insecurity: No Food Insecurity  . Worried About Programme researcher, broadcasting/film/video in the Last Year: Never true  . Ran Out of Food in the Last Year: Never true  Transportation Needs: No Transportation Needs  . Lack of Transportation (Medical): No  . Lack of Transportation (Non-Medical): No  Physical Activity: Inactive  . Days of Exercise per Week: 0 days  . Minutes of Exercise per Session: 0 min  Stress: No Stress Concern Present  . Feeling of Stress : Not at all  Social Connections:   . Frequency of Communication with Friends and Family:   . Frequency of Social Gatherings with Friends and Family:   . Attends Religious Services:   .  Active Member of Clubs or Organizations:   . Attends Archivist Meetings:   Marland Kitchen Marital Status:      Family History: The patient's family history includes Diabetes in her father; Heart failure in her maternal grandfather; Hypertension in her mother; Mitral valve prolapse in her father and mother; Other in her brother; Parkinson's disease in her brother; Stroke in her paternal grandfather; Stroke (age of onset: 67) in her mother. There is no history of Cancer, Colon cancer, Stomach cancer, Esophageal cancer, Pancreatic cancer, or Liver disease.  ROS:   Please see the history of present illness.    Sleeps well.  Diet is unchanged.  All other systems reviewed and are negative.  EKGs/Labs/Other Studies Reviewed:    The following studies were reviewed today: No new data  EKG:  EKG normal sinus rhythm with poor R wave progression  Recent Labs: 03/05/2019: ALT 24; BUN 11; Creatinine, Ser 0.81; Hemoglobin 12.1; Platelets 224.0; Potassium 4.2; Sodium 133; TSH 3.51  Recent Lipid Panel    Component Value Date/Time   CHOL 233 (H) 03/05/2019 0820   CHOL 297 (H) 02/27/2015 1525   TRIG 124.0 03/05/2019 0820   TRIG 172 (H) 02/27/2015 1525   TRIG 129 07/30/2010 0000   HDL 52.10 03/05/2019 0820   HDL 59 02/27/2015 1525   CHOLHDL 4 03/05/2019 0820   VLDL  24.8 03/05/2019 0820   LDLCALC 156 (H) 03/05/2019 0820   LDLCALC 204 (H) 02/27/2015 1525   LDLDIRECT 169.0 02/27/2018 0847    Physical Exam:    VS:  BP (!) 152/80   Pulse 98   Ht 5' 7.5" (1.715 m)   Wt 182 lb 12.8 oz (82.9 kg)   SpO2 96%   BMI 28.21 kg/m     Wt Readings from Last 3 Encounters:  07/03/19 182 lb 12.8 oz (82.9 kg)  05/17/19 180 lb 9.6 oz (81.9 kg)  05/09/19 181 lb (82.1 kg)     GEN: Moderate obesity. No acute distress HEENT: Normal NECK: No JVD. LYMPHATICS: No lymphadenopathy CARDIAC:  RRR without murmur, gallop, or edema. VASCULAR:  Normal Pulses. No bruits. RESPIRATORY:  Clear to auscultation without rales, wheezing or rhonchi  ABDOMEN: Soft, non-tender, non-distended, No pulsatile mass, MUSCULOSKELETAL: No deformity  SKIN: Warm and dry NEUROLOGIC:  Alert and oriented x 3 PSYCHIATRIC:  Normal affect   ASSESSMENT:    1. Essential hypertension   2. Hyperlipidemia, unspecified hyperlipidemia type   3. Controlled type 2 diabetes mellitus without complication, without long-term current use of insulin ([place])   4. Educated about COVID-19 virus infection    PLAN:    In order of problems listed above:  1. Blood pressure recordings at home seem to run between 120 and 145 mmHg.  This is excellent for her current age.  Diastolic pressures run between 60 and 80 mmHg.  Low-salt diet is recommended.  Target will change the 140/80 mmHg next year when she turns 80. 2. No active therapy.  Most recent LDL is elevated at 156.  We will send her back to the lipid clinic.  She has had statin intolerance in the past. 3. Given history of diabetes, lipid therapy is mandatory. 4. COVID-19 vaccine has been received.  Social distancing is being practiced.     Medication Adjustments/Labs and Tests Ordered: Current medicines are reviewed at length with the patient today.  Concerns regarding medicines are outlined above.  Orders Placed This Encounter  Procedures  . EKG  12-Lead   No orders of the  defined types were placed in this encounter.   Patient Instructions  Medication Instructions:  Your physician recommends that you continue on your current medications as directed. Please refer to the Current Medication list given to you today.  *If you need a refill on your cardiac medications before your next appointment, please call your pharmacy*   Lab Work: None If you have labs (blood work) drawn today and your tests are completely normal, you will receive your results only by: Marland Kitchen MyChart Message (if you have MyChart) OR . A paper copy in the mail If you have any lab test that is abnormal or we need to change your treatment, we will call you to review the results.   Testing/Procedures: None   Follow-Up: At [facility], you and your health needs are our priority.  As part of our continuing mission to provide you with exceptional heart care, we have created designated Provider Care Teams.  These Care Teams include your primary Cardiologist (physician) and Advanced Practice Providers (APPs -  Physician Assistants and Nurse Practitioners) who all work together to provide you with the care you need, when you need it.  We recommend signing up for the patient portal called "MyChart".  Sign up information is provided on this After Visit Summary.  MyChart is used to connect with patients for Virtual Visits (Telemedicine).  Patients are able to view lab/test results, encounter notes, upcoming appointments, etc.  Non-urgent messages can be sent to your provider as well.   To learn more about what you can do with MyChart, go to [email or web address].    Your next appointment:   12 month(s)  The format for your next appointment:   In Person  Provider:   You may see Lesleigh Noe, MD or one of the following Advanced Practice Providers on your designated Care Team:    Norma Fredrickson, NP  Nada Boozer, NP  Georgie Chard, NP    Other  Instructions      Signed, Lesleigh Noe, MD  07/03/2019 3:25 PM    [facility]

## 2019-07-03 ENCOUNTER — Ambulatory Visit: Payer: Medicare PPO | Admitting: Interventional Cardiology

## 2019-07-03 ENCOUNTER — Other Ambulatory Visit: Payer: Self-pay

## 2019-07-03 ENCOUNTER — Encounter: Payer: Self-pay | Admitting: Interventional Cardiology

## 2019-07-03 VITALS — BP 152/80 | HR 98 | Ht 67.5 in | Wt 182.8 lb

## 2019-07-03 DIAGNOSIS — E785 Hyperlipidemia, unspecified: Secondary | ICD-10-CM | POA: Diagnosis not present

## 2019-07-03 DIAGNOSIS — E119 Type 2 diabetes mellitus without complications: Secondary | ICD-10-CM | POA: Diagnosis not present

## 2019-07-03 DIAGNOSIS — I10 Essential (primary) hypertension: Secondary | ICD-10-CM

## 2019-07-03 DIAGNOSIS — Z7189 Other specified counseling: Secondary | ICD-10-CM

## 2019-07-03 NOTE — Patient Instructions (Signed)

## 2019-07-10 ENCOUNTER — Other Ambulatory Visit: Payer: Self-pay | Admitting: Interventional Cardiology

## 2019-07-16 ENCOUNTER — Telehealth: Payer: Self-pay | Admitting: Interventional Cardiology

## 2019-07-16 NOTE — Telephone Encounter (Signed)
Called patient back. Brieftly discussed benefits of PCSK9i and Nexletol. Discussed unknown cardiovascular benefit if Nexletol and the cost vs benefit for each drug. Also discussed how its a  personal decision as to how aggressive she would like to be. Patient would like to remain on the schedule for now. Will discuss with her family and will call tomorrow if she would like to cancel.

## 2019-07-16 NOTE — Telephone Encounter (Signed)
New Message  Pt called and stated that she needed to ask the pharmacists questions about samples. She said its a new one on the market and she would like more information on it and the name of it.

## 2019-07-18 ENCOUNTER — Ambulatory Visit: Payer: Medicare PPO

## 2019-07-19 ENCOUNTER — Other Ambulatory Visit: Payer: Self-pay | Admitting: Family Medicine

## 2019-07-19 DIAGNOSIS — E039 Hypothyroidism, unspecified: Secondary | ICD-10-CM

## 2019-07-22 ENCOUNTER — Other Ambulatory Visit: Payer: Self-pay | Admitting: Family Medicine

## 2019-08-15 ENCOUNTER — Ambulatory Visit: Payer: Medicare PPO

## 2019-08-20 ENCOUNTER — Other Ambulatory Visit: Payer: Self-pay | Admitting: Interventional Cardiology

## 2019-08-22 ENCOUNTER — Other Ambulatory Visit: Payer: Self-pay | Admitting: Internal Medicine

## 2019-09-10 ENCOUNTER — Ambulatory Visit: Payer: Medicare PPO | Admitting: Family Medicine

## 2019-09-24 ENCOUNTER — Encounter: Payer: Self-pay | Admitting: Family Medicine

## 2019-09-24 ENCOUNTER — Ambulatory Visit: Payer: Medicare PPO | Admitting: Family Medicine

## 2019-09-24 ENCOUNTER — Other Ambulatory Visit: Payer: Self-pay

## 2019-09-24 VITALS — BP 132/80 | HR 87 | Temp 97.7°F | Wt 176.2 lb

## 2019-09-24 DIAGNOSIS — E119 Type 2 diabetes mellitus without complications: Secondary | ICD-10-CM | POA: Diagnosis not present

## 2019-09-24 DIAGNOSIS — R252 Cramp and spasm: Secondary | ICD-10-CM | POA: Diagnosis not present

## 2019-09-24 DIAGNOSIS — R200 Anesthesia of skin: Secondary | ICD-10-CM

## 2019-09-24 LAB — FERRITIN: Ferritin: 5.1 ng/mL — ABNORMAL LOW (ref 10.0–291.0)

## 2019-09-24 LAB — MAGNESIUM: Magnesium: 1.7 mg/dL (ref 1.5–2.5)

## 2019-09-24 LAB — BASIC METABOLIC PANEL
BUN: 14 mg/dL (ref 6–23)
CO2: 28 mEq/L (ref 19–32)
Calcium: 9.4 mg/dL (ref 8.4–10.5)
Chloride: 94 mEq/L — ABNORMAL LOW (ref 96–112)
Creatinine, Ser: 0.8 mg/dL (ref 0.40–1.20)
GFR: 69.2 mL/min (ref >60.00)
Glucose, Bld: 147 mg/dL — ABNORMAL HIGH (ref 70–99)
Potassium: 4.4 mEq/L (ref 3.5–5.1)
Sodium: 133 mEq/L — ABNORMAL LOW (ref 135–145)

## 2019-09-24 LAB — POCT GLYCOSYLATED HEMOGLOBIN (HGB A1C): Hemoglobin A1C: 7 % — AB (ref 4.0–5.6)

## 2019-09-24 NOTE — Progress Notes (Signed)
Subjective:    Patient ID: Jordan Patterson, female    DOB: 26-Aug-1940, 79 y.o.   MRN: [id number]  HPI Chief Complaint  Patient presents with   Follow-up    6 months   Numbness    Occasionally in toes and hands   Leg Pain    "cramps"   Has been walking every day for at least a mile.   Leg cramps for years, now a little worse. Can occur daily, mostly at night but some during the day. Esp if she gets down on her knees. Pain in upper and lower legs, right and left.  Resolved with position change.  Numbness in hands and toes- not every day. Some tingling and pain.  Knows that she has arthritis in her hands.  Symptoms in feet seem random, will occur in 1 or the other.  Feet are always cold, this is chronic.  Chronic cough- off and on, has appointment with Dr. Maple Hudson upcoming. Gets relief with menthol cough drops.   Review of Systems No chest pain, SOB, leg swelling    Objective:   Physical Exam Vitals reviewed.  Constitutional:      General: She is not in acute distress.    Appearance: Normal appearance. She is normal weight. She is not ill-appearing, toxic-appearing or diaphoretic.  HENT:     Head: Normocephalic and atraumatic.  Eyes:     Conjunctiva/sclera: Conjunctivae normal.  Cardiovascular:     Rate and Rhythm: Normal rate.     Pulses: Normal pulses.     Comments: Bilateral lower extremities with normal color, temperature, PT/DP pulses, normal nails. Pulmonary:     Effort: Pulmonary effort is normal.  Musculoskeletal:     Cervical back: Normal range of motion and neck supple.     Right lower leg: No edema.     Left lower leg: No edema.     Comments: Bilateral joints of the fingers with Heberden's nodes.  Skin:    General: Skin is warm and dry.  Neurological:     Mental Status: She is alert and oriented to person, place, and time.  Psychiatric:        Mood and Affect: Mood normal.        Behavior: Behavior normal.        Thought Content: Thought content normal.         Judgment: Judgment normal.       BP (!) 132/80    Pulse 87    Temp 97.7 F (36.5 C) (Temporal)    Wt 176 lb 4 oz (79.9 kg)    SpO2 94%    BMI 27.20 kg/m  Wt Readings from Last 3 Encounters:  09/24/19 176 lb 4 oz (79.9 kg)  07/03/19 182 lb 12.8 oz (82.9 kg)  05/17/19 180 lb 9.6 oz (81.9 kg)   BP Readings from Last 3 Encounters:  09/24/19 (!) 132/80  07/03/19 (!) 152/80  05/17/19 122/68   Results for orders placed or performed in visit on 09/24/19  POCT glycosylated hemoglobin (Hb A1C)  Result Value Ref Range   Hemoglobin A1C 7.0 (A) 4.0 - 5.6 %   HbA1c POC (<> result, manual entry)     HbA1c, POC (prediabetic range)     HbA1c, POC (controlled diabetic range)         Assessment & Plan:  1. Controlled type 2 diabetes mellitus without complication, without long-term current use of insulin (HCC) -Patient with good control.  Hemoglobin A1c today 7.0, down from  7.3. - POCT glycosylated hemoglobin (Hb A1C) - Basic metabolic panel  2. Bilateral leg cramps -Encouraged her to continue daily walking, discussed stretching 1-2 times per day, will also check labs - Basic metabolic panel - Ferritin - Magnesium  3. Numbness in both hands -Intermittent in nature, does not seem likely due to nerve compression, she does have osteoarthritic changes in hands.  We will continue to monitor.  4. Numbness in feet -Limited to her toes which are normal on exam with no signs or symptoms of circulatory problems. -We will check labs  -Follow-up in 6 months for CPE  This visit occurred during the SARS-CoV-2 public health emergency.  Safety protocols were in place, including screening questions prior to the visit, additional usage of staff PPE, and extensive cleaning of exam room while observing appropriate contact time as indicated for disinfecting solutions.      Jordan Ree, FNP-BC  [facility], [facility]  09/24/2019 11:58 AM

## 2019-09-24 NOTE — Patient Instructions (Addendum)
Good to see you today  I will notify you of lab results and whether or not there is anything deficient that could be causing your leg cramps  Try to stretch twice a day, continue walking  See you after 03/11/20 for your annual physical exam

## 2019-09-25 ENCOUNTER — Encounter: Payer: Self-pay | Admitting: Family Medicine

## 2019-09-26 ENCOUNTER — Encounter: Payer: Self-pay | Admitting: Family Medicine

## 2019-11-17 ENCOUNTER — Encounter: Payer: Self-pay | Admitting: Family Medicine

## 2019-11-22 ENCOUNTER — Encounter: Payer: Self-pay | Admitting: Internal Medicine

## 2019-11-22 ENCOUNTER — Ambulatory Visit (INDEPENDENT_AMBULATORY_CARE_PROVIDER_SITE_OTHER): Payer: Medicare PPO | Admitting: Internal Medicine

## 2019-11-22 ENCOUNTER — Other Ambulatory Visit: Payer: Self-pay

## 2019-11-22 DIAGNOSIS — J4540 Moderate persistent asthma, uncomplicated: Secondary | ICD-10-CM

## 2019-11-22 DIAGNOSIS — K219 Gastro-esophageal reflux disease without esophagitis: Secondary | ICD-10-CM | POA: Diagnosis not present

## 2019-11-22 MED ORDER — BREZTRI AEROSPHERE 160-9-4.8 MCG/ACT IN AERO
2.0000 | INHALATION_SPRAY | Freq: Two times a day (BID) | RESPIRATORY_TRACT | 0 refills | Status: DC
Start: 2019-11-22 — End: 2019-12-11

## 2019-11-22 NOTE — Progress Notes (Signed)
HPI F never smoker followed for allergic rhinitis, asthma, complicated by GERD, hypothyroid, DM 2 FENO 12/29/15- 37-elevated, indicating allergic component to airway irritation. Office Spirometry 12/29/2015-moderately severe obstructive airways disease-FVC 1.99/60%, FEV1 1.29/51%, ratio 0.65, FEF 25-75 0.73/39%. Office Spirometry 08/17/2017- Moderate restriction.  FVC 2.1/66%, FEV1 1.6/65%, ratio 1.74, FEF 25-75% 1.3/65% CBC 08/18/2017-eos WNL 2.8, Allergy Profile 08/17/2017-elevated for dust mites, cat, pecan/hickory pollen, total IgE WNL 16 --------------------------------------------------------------------------------------------   05/17/19- 79 year old female never smoker followed for Allergic rhinitis, Asthma/ Chronic Cough, complicated by GERD/ HH (GI recs no Nissen at this time), hypothyroid, DM 2 -----F/u moderate persistent asthma without complication.  Daughter here taking notes. No Covax yet- wanted to discuss safety.Questions answered Trelegy no help. Much cough still, mostly dry. ProAir hfa helps best, used 2-3x/ day.  Aware of reflux at times- blamed in past for her cough.  Considering pelvic floor surgery.  CXR 12/14/2018- IMPRESSION: 1.  No radiographic evidence of acute cardiopulmonary disease. 2. Aortic atherosclerosis.  11/22/19- 79 year old female never smoker followed for Allergic rhinitis, Asthma/ Chronic Cough, complicated by GERD/ HH (GI recs no Nissen at this time), hypothyroid, DM 2, HTN Ventolin hfa, Singulair,       Trelegy didn't help cough Pepcid/ Nexium Covid vax- 2 Phizer Flu vax- not yet ------chronic cough and asthma Mostly dry cough. Some LPR cough with crumbly foods. Discussed chin-tuck. Cough doesn't bother sleep. Some mild wheeze. Trelegy didn't help, but albuterol helps occ if needed. Mild exertional DOE. Questions benefit of Singulair.    ROS-see HPI    += positive Constitutional:   No-   weight loss, night sweats, fevers, chills, fatigue,  lassitude. HEENT:   No-  headaches, difficulty swallowing, tooth/dental problems, sore throat,       +sneezing, itching, ear ache, nasal congestion, post nasal drip,  CV:  No-   chest pain, orthopnea, PND, swelling in lower extremities, anasarca,                                                       dizziness, palpitations Resp: No-   shortness of breath with exertion or at rest.              No-   productive cough,  + non-productive cough,  No- coughing up of blood.              No-   change in color of mucus.+ wheezing.   Skin: No-   rash or lesions. GI:  +  heartburn, indigestion, No-abdominal pain, nausea, vomiting, GU:  MS:  No-   joint pain or swelling.  + back pain Neuro-     nothing unusual Psych:  No- change in mood or affect. No depression or anxiety.  No memory loss.  OBJ- Physical Exam General- Alert, Oriented, Affect-appropriate, Distress- none acute     Daughter here Skin- rash-none, lesions- none, excoriation- none Lymphadenopathy- none Head- atraumatic            Eyes- Gross vision intact, PERRLA, conjunctivae and secretions clear            Ears- clear            Nose- Clear, no-Septal dev, mucus, polyps, erosion, perforation             Throat- Mallampati II , mucosa clear , drainage+white, tonsils- atrophic Neck-  flexible , trachea midline, no stridor , thyroid nl, carotid no bruit Chest - symmetrical excursion , unlabored           Heart/CV- RRR , no murmur , no gallop  , no rub, nl s1 s2                           - JVD- none , edema- none, stasis changes- none, varices- none           Lung- clear to P&A/ no crackles, wheeze- none, cough+ dry with deep breath , dullness-none, rub- none           Chest wall-  Abd-  Br/ Gen/ Rectal- Not done, not indicated Extrem- cyanosis- none, clubbing, none, atrophy- none, strength- nl Neuro- grossly intact to observation

## 2019-11-22 NOTE — Assessment & Plan Note (Signed)
Dry cough, likely with components of asthma, upper airway cough and LPR or reflux. Plan- reflux precautions. Try samples Breztri. Ok to try off singulair.

## 2019-11-22 NOTE — Assessment & Plan Note (Signed)
Important to continue reflux precautions as reviewed.

## 2019-11-22 NOTE — Patient Instructions (Signed)
Sample x 2 Brestri inhaler    Inhale 2 puffs then  rinse mouth, twice daily   See if this help the cough  Do get your flu vaccine and Covid booster this Fall  Ok to try  Off Singulair to see if you think it helps  Please call if we can help you

## 2019-12-08 ENCOUNTER — Other Ambulatory Visit: Payer: Self-pay

## 2019-12-08 ENCOUNTER — Ambulatory Visit (INDEPENDENT_AMBULATORY_CARE_PROVIDER_SITE_OTHER): Payer: Medicare PPO

## 2019-12-08 DIAGNOSIS — Z23 Encounter for immunization: Secondary | ICD-10-CM | POA: Diagnosis not present

## 2019-12-11 ENCOUNTER — Ambulatory Visit: Payer: Medicare PPO | Admitting: Podiatrist

## 2019-12-11 ENCOUNTER — Other Ambulatory Visit: Payer: Self-pay

## 2019-12-11 ENCOUNTER — Encounter: Payer: Self-pay | Admitting: Podiatrist

## 2019-12-11 VITALS — BP 128/76 | HR 86 | Temp 98.2°F

## 2019-12-11 DIAGNOSIS — S90221A Contusion of right lesser toe(s) with damage to nail, initial encounter: Secondary | ICD-10-CM | POA: Diagnosis not present

## 2019-12-11 DIAGNOSIS — L609 Nail disorder, unspecified: Secondary | ICD-10-CM

## 2019-12-11 MED ORDER — BREZTRI AEROSPHERE 160-9-4.8 MCG/ACT IN AERO: 2.0000 | INHALATION_SPRAY | Freq: Two times a day (BID) | RESPIRATORY_TRACT | 4 refills | Status: DC

## 2019-12-11 NOTE — Progress Notes (Signed)
Chief Complaint  Patient presents with   Nail Problem    discolored thick great toenail right foot. may need removal      HPI: Patient is 79 y.o. female who presents today for the concerns as listed above. She relates the great toenail is loose but not painful.  She would like advise on how to treat.    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   Hyponatremia 12/14/2018   Laryngopharyngeal reflux (LPR) 08/24/2017   Osteoporosis 08/24/2017   Leg cramping 08/21/2017   Regurgitation of food    Prolapse of female pelvic organs 02/19/2017   Compression fracture of T12 vertebra (HCC) 07/18/2014   Hypertensive retinopathy of both eyes 06/04/2014   Nonexudative age-related macular degeneration 06/04/2014   Posterior vitreous detachment of both eyes 06/04/2014   Pseudophakia of both eyes 06/04/2014   Right knee pain 05/30/2014   Advanced care planning/counseling discussion 01/24/2014   Health maintenance examination 01/24/2014   Dysgeusia 11/12/2013   Upper airway cough syndrome 03/19/2013   Fatigue 06/14/2012   Allergic conjunctivitis and rhinitis 05/15/2012   Asthma, moderate persistent 03/09/2012   Medicare annual wellness visit, subsequent 01/14/2012   Hiatal hernia    GERD (gastroesophageal reflux disease)    Essential hypertension    Hyperlipidemia    Depression    Hypothyroidism    Osteoporosis with fracture    Diabetes type 2, controlled (HCC)    Osteoarthritis     Current Outpatient Medications on File Prior to Visit  Medication Sig Dispense Refill   albuterol (VENTOLIN HFA) 108 (90 Base) MCG/ACT inhaler TAKE 2 PUFFS BY MOUTH EVERY 6 HOURS AS NEEDED FOR WHEEZE OR SHORTNESS OF BREATH 18 g 11   Aloe Vera 25 MG CAPS Take 25 mg by mouth daily.     amLODipine (NORVASC) 5 MG tablet TAKE 1 TABLET BY MOUTH EVERY DAY 90 tablet 3    aspirin 81 MG tablet Take 81 mg by mouth daily.     chlorpheniramine (CHLOR-TRIMETON) 4 MG tablet Take 4 mg by mouth 2 (two) times daily as needed for allergies.     chlorthalidone (HYGROTON) 25 MG tablet Take 0.5 mg by mouth daily.     Cholecalciferol (VITAMIN D3) 1000 units CAPS Take 1 capsule (1,000 Units total) by mouth daily. 30 capsule    diphenhydramine-acetaminophen (ACETAMINOPHEN PM) 25-500 MG TABS tablet Take 1 tablet by mouth at bedtime as needed.     esomeprazole (NEXIUM) 40 MG capsule TAKE 1 CAPSULE BY MOUTH EVERY DAY BEFORE BREAKFAST 90 capsule 6   famotidine (PEPCID) 20 MG tablet Take 1 tablet (20 mg total) by mouth at bedtime. 90 tablet 6   Garlic (GARLIQUE PO) Take 1 tablet by mouth daily.     levothyroxine (SYNTHROID) 100 MCG tablet TAKE 1 TABLET BY MOUTH EVERY MORNING WITH BREAKFAST 30 tablet 4   MAGNESIUM PO Take by mouth.     metFORMIN (GLUCOPHAGE) 500 MG tablet TAKE 1 TABLET BY MOUTH TWICE A DAY WITH MEALS 180 tablet 1   montelukast (SINGULAIR) 10 MG tablet TAKE 1 TABLET BY MOUTH EVERYDAY AT BEDTIME 30 tablet 5   Multiple Vitamins-Minerals (MULTIVITAMIN PO) Take 1 tablet by mouth daily.     polyethylene glycol (MIRALAX / GLYCOLAX) packet Take 8.5 g by mouth once a week.      Polyvinyl Alcohol-Povidone (REFRESH OP) Place 1 drop into both eyes daily.  Saline (SIMPLY SALINE) 0.9 % AERS Place into the nose as needed.     Specialty Vitamins Products (CARDIOVASCULAR SUPPORT PO) Take by mouth daily.     valsartan (DIOVAN) 320 MG tablet Take 1 tablet (320 mg total) by mouth daily. 90 tablet 2   No current facility-administered medications on file prior to visit.    Allergies  Allergen Reactions   Ace Inhibitors Cough   Amlodipine Cough   Crestor [Rosuvastatin] Other (See Comments)    myalgias   Pregabalin Other (See Comments)    Unknown reaction   Tegretol [Carbamazepine] Other (See Comments)    Dizziness, headache   Wellbutrin [Bupropion Hcl]  Other (See Comments)    Unknown reaction   Cymbalta [Duloxetine Hcl] Palpitations and Other (See Comments)    headaches   Lipitor [Atorvastatin] Cough    Review of Systems No fevers, chills, nausea, muscle aches, no difficulty breathing, no calf pain, no chest pain or shortness of breath.   Physical Exam  GENERAL APPEARANCE: Alert, conversant. Appropriately groomed. No acute distress.   VASCULAR: Pedal pulses palpable DP and PT bilateral.  Capillary refill time is immediate to all digits,  Proximal to distal cooling it warm to warm.  Digital perfusion adequate.   NEUROLOGIC: sensation is intact to 5.07 monofilament at 5/5 sites bilateral.  Light touch is intact bilateral, vibratory sensation intact bilateral  MUSCULOSKELETAL: acceptable muscle strength, tone and stability bilateral.  No gross boney pedal deformities noted.  No pain, crepitus or limitation noted with foot and ankle range of motion bilateral.   DERMATOLOGIC: skin is warm, supple, and dry.  No open lesions noted.  No rash, no pre ulcerative lesions. Right hallux nail is lifting from the nail bed and remains attached at the base of the nail. No redness, swelling, no drainage, no bruising, no sign of infection.  No pain with palpation or exam.   Assessment     ICD-10-CM   1. Contusion of toenail of right foot, initial encounter  S90.221A   2. Loose toenail  L60.9      Plan  Discussed treatment options including removal of the toenail and trimming the toenail back, vs leaving the nail alone and allowing it to come off on its own.  The patient elects to leave the nail alone as it is not bothersome to her and is not infected.  I did dispense a silicone sleeve for her to wear to help hold the nail in place.  I instructed her that if she sees any drainage, redness, swelling or experiences any pain to call and we can remove the nail for her.

## 2019-12-11 NOTE — Addendum Note (Signed)
Addended by: Jaynee Eagles C on: 12/11/2019 11:14 AM   Modules accepted: Orders

## 2019-12-11 NOTE — Telephone Encounter (Signed)
Ok to send [employer], # 3, ref x 4    Inhale 2 puffs then rinse mouth, twice daily Thanks

## 2019-12-11 NOTE — Telephone Encounter (Signed)
CY - please advise. Thanks! 

## 2019-12-22 ENCOUNTER — Other Ambulatory Visit: Payer: Self-pay | Admitting: Family Medicine

## 2019-12-22 DIAGNOSIS — E039 Hypothyroidism, unspecified: Secondary | ICD-10-CM

## 2020-01-07 ENCOUNTER — Other Ambulatory Visit: Payer: Self-pay | Admitting: Interventional Cardiology

## 2020-01-12 ENCOUNTER — Other Ambulatory Visit: Payer: Self-pay | Admitting: Internal Medicine

## 2020-01-14 NOTE — Telephone Encounter (Signed)
Pt is requesting refill MONTELUKAST SOD 10 MG TABLET next ov 11/21/2020

## 2020-01-14 NOTE — Telephone Encounter (Signed)
Singulair refilled.

## 2020-01-26 ENCOUNTER — Other Ambulatory Visit: Payer: Self-pay | Admitting: Family Medicine

## 2020-01-26 DIAGNOSIS — E039 Hypothyroidism, unspecified: Secondary | ICD-10-CM

## 2020-03-02 ENCOUNTER — Encounter: Payer: Self-pay | Admitting: Family Medicine

## 2020-03-11 ENCOUNTER — Telehealth: Payer: Self-pay | Admitting: Family Medicine

## 2020-03-11 DIAGNOSIS — E785 Hyperlipidemia, unspecified: Secondary | ICD-10-CM

## 2020-03-11 DIAGNOSIS — E119 Type 2 diabetes mellitus without complications: Secondary | ICD-10-CM

## 2020-03-11 DIAGNOSIS — Z79899 Other long term (current) drug therapy: Secondary | ICD-10-CM | POA: Insufficient documentation

## 2020-03-11 DIAGNOSIS — E039 Hypothyroidism, unspecified: Secondary | ICD-10-CM

## 2020-03-11 DIAGNOSIS — M8000XD Age-related osteoporosis with current pathological fracture, unspecified site, subsequent encounter for fracture with routine healing: Secondary | ICD-10-CM

## 2020-03-11 DIAGNOSIS — I10 Essential (primary) hypertension: Secondary | ICD-10-CM

## 2020-03-11 NOTE — Telephone Encounter (Signed)
-----   Message from Aquilla Solian, RT sent at 02/25/2020  3:27 PM EST ----- Regarding: Lab Orders for Friday 1.21.2022 This is a Jordan Patterson pt w/a CPE appt for you on Wednesday 1.26.2022   Please place lab orders for Friday 1.2a.2022  Thank you, Jones Bales RT(R)

## 2020-03-12 ENCOUNTER — Ambulatory Visit (INDEPENDENT_AMBULATORY_CARE_PROVIDER_SITE_OTHER): Payer: Medicare PPO

## 2020-03-12 DIAGNOSIS — Z0000 Encounter for general adult medical examination without abnormal findings: Secondary | ICD-10-CM

## 2020-03-12 NOTE — Patient Instructions (Signed)
Jordan Patterson , Thank you for taking time to come for your Medicare Wellness Visit. I appreciate your ongoing commitment to your health goals. Please review the following plan we discussed and let me know if I can assist you in the future.   Screening recommendations/referrals: Colonoscopy: no longer required Mammogram: no longer required  Bone Density: declined Recommended yearly ophthalmology/optometry visit for glaucoma screening and checkup Recommended yearly dental visit for hygiene and checkup  Vaccinations: Influenza vaccine: Up to date, completed 12/08/2019, due 09/2020 Pneumococcal vaccine: Completed series Tdap vaccine: Up to date, completed 01/14/2012, due 12/2021 Shingles vaccine: due, check with your insurance regarding coverage if interested    Covid-19: 2 doses completed, booster due, You state you will make appointment for this soon.   Advanced directives: Please bring a copy of your POA (Power of Attorney) and/or Living Will to your next appointment.   Conditions/risks identified: diabetes, hypertension  Next appointment: Follow up in one year for your annual wellness visit    Preventive Care 65 Years and Older, Female Preventive care refers to lifestyle choices and visits with your health care provider that can promote health and wellness. What does preventive care include?  A yearly physical exam. This is also called an annual well check.  Dental exams once or twice a year.  Routine eye exams. Ask your health care provider how often you should have your eyes checked.  Personal lifestyle choices, including:  Daily care of your teeth and gums.  Regular physical activity.  Eating a healthy diet.  Avoiding tobacco and drug use.  Limiting alcohol use.  Practicing safe sex.  Taking low-dose aspirin every day.  Taking vitamin and mineral supplements as recommended by your health care provider. What happens during an annual well check? The services and  screenings done by your health care provider during your annual well check will depend on your age, overall health, lifestyle risk factors, and family history of disease. Counseling  Your health care provider may ask you questions about your:  Alcohol use.  Tobacco use.  Drug use.  Emotional well-being.  Home and relationship well-being.  Sexual activity.  Eating habits.  History of falls.  Memory and ability to understand (cognition).  Work and work Astronomer.  Reproductive health. Screening  You may have the following tests or measurements:  Height, weight, and BMI.  Blood pressure.  Lipid and cholesterol levels. These may be checked every 5 years, or more frequently if you are over 80 years old.  Skin check.  Lung cancer screening. You may have this screening every year starting at age 80 if you have a 30-pack-year history of smoking and currently smoke or have quit within the past 15 years.  Fecal occult blood test (FOBT) of the stool. You may have this test every year starting at age 80.  Flexible sigmoidoscopy or colonoscopy. You may have a sigmoidoscopy every 5 years or a colonoscopy every 10 years starting at age 80.  Hepatitis C blood test.  Hepatitis B blood test.  Sexually transmitted disease (STD) testing.  Diabetes screening. This is done by checking your blood sugar (glucose) after you have not eaten for a while (fasting). You may have this done every 1-3 years.  Bone density scan. This is done to screen for osteoporosis. You may have this done starting at age 80.  Mammogram. This may be done every 1-2 years. Talk to your health care provider about how often you should have regular mammograms. Talk with your health  care provider about your test results, treatment options, and if necessary, the need for more tests. Vaccines  Your health care provider may recommend certain vaccines, such as:  Influenza vaccine. This is recommended every  year.  Tetanus, diphtheria, and acellular pertussis (Tdap, Td) vaccine. You may need a Td booster every 10 years.  Zoster vaccine. You may need this after age 80.  Pneumococcal 13-valent conjugate (PCV13) vaccine. One dose is recommended after age 80.  Pneumococcal polysaccharide (PPSV23) vaccine. One dose is recommended after age 80. Talk to your health care provider about which screenings and vaccines you need and how often you need them. This information is not intended to replace advice given to you by your health care provider. Make sure you discuss any questions you have with your health care provider. Document Released: 03/07/2015 Document Revised: 10/29/2015 Document Reviewed: 12/10/2014 Elsevier Interactive Patient Education  2017 [place] Prevention in the Home Falls can cause injuries. They can happen to people of all ages. There are many things you can do to make your home safe and to help prevent falls. What can I do on the outside of my home?  Regularly fix the edges of walkways and driveways and fix any cracks.  Remove anything that might make you trip as you walk through a door, such as a raised step or threshold.  Trim any bushes or trees on the path to your home.  Use bright outdoor lighting.  Clear any walking paths of anything that might make someone trip, such as rocks or tools.  Regularly check to see if handrails are loose or broken. Make sure that both sides of any steps have handrails.  Any raised decks and porches should have guardrails on the edges.  Have any leaves, snow, or ice cleared regularly.  Use sand or salt on walking paths during winter.  Clean up any spills in your garage right away. This includes oil or grease spills. What can I do in the bathroom?  Use night lights.  Install grab bars by the toilet and in the tub and shower. Do not use towel bars as grab bars.  Use non-skid mats or decals in the tub or shower.  If you  need to sit down in the shower, use a plastic, non-slip stool.  Keep the floor dry. Clean up any water that spills on the floor as soon as it happens.  Remove soap buildup in the tub or shower regularly.  Attach bath mats securely with double-sided non-slip rug tape.  Do not have throw rugs and other things on the floor that can make you trip. What can I do in the bedroom?  Use night lights.  Make sure that you have a light by your bed that is easy to reach.  Do not use any sheets or blankets that are too big for your bed. They should not hang down onto the floor.  Have a firm chair that has side arms. You can use this for support while you get dressed.  Do not have throw rugs and other things on the floor that can make you trip. What can I do in the kitchen?  Clean up any spills right away.  Avoid walking on wet floors.  Keep items that you use a lot in easy-to-reach places.  If you need to reach something above you, use a strong step stool that has a grab bar.  Keep electrical cords out of the way.  Do not use floor  polish or wax that makes floors slippery. If you must use wax, use non-skid floor wax.  Do not have throw rugs and other things on the floor that can make you trip. What can I do with my stairs?  Do not leave any items on the stairs.  Make sure that there are handrails on both sides of the stairs and use them. Fix handrails that are broken or loose. Make sure that handrails are as long as the stairways.  Check any carpeting to make sure that it is firmly attached to the stairs. Fix any carpet that is loose or worn.  Avoid having throw rugs at the top or bottom of the stairs. If you do have throw rugs, attach them to the floor with carpet tape.  Make sure that you have a light switch at the top of the stairs and the bottom of the stairs. If you do not have them, ask someone to add them for you. What else can I do to help prevent falls?  Wear shoes  that:  Do not have high heels.  Have rubber bottoms.  Are comfortable and fit you well.  Are closed at the toe. Do not wear sandals.  If you use a stepladder:  Make sure that it is fully opened. Do not climb a closed stepladder.  Make sure that both sides of the stepladder are locked into place.  Ask someone to hold it for you, if possible.  Clearly mark and make sure that you can see:  Any grab bars or handrails.  First and last steps.  Where the edge of each step is.  Use tools that help you move around (mobility aids) if they are needed. These include:  Canes.  Walkers.  Scooters.  Crutches.  Turn on the lights when you go into a dark area. Replace any light bulbs as soon as they burn out.  Set up your furniture so you have a clear path. Avoid moving your furniture around.  If any of your floors are uneven, fix them.  If there are any pets around you, be aware of where they are.  Review your medicines with your doctor. Some medicines can make you feel dizzy. This can increase your chance of falling. Ask your doctor what other things that you can do to help prevent falls. This information is not intended to replace advice given to you by your health care provider. Make sure you discuss any questions you have with your health care provider. Document Released: 12/05/2008 Document Revised: 07/17/2015 Document Reviewed: 03/15/2014 Elsevier Interactive Patient Education  2017 [employer].

## 2020-03-12 NOTE — Progress Notes (Signed)
PCP notes:  Health Maintenance: COVID booster due Foot exam- due Dexa- declined Shingrix- declined   Abnormal Screenings: none   Patient concerns: none   Nurse concerns: none   Next PCP appt.: 03/19/2020 @ 9:30 am

## 2020-03-12 NOTE — Progress Notes (Signed)
Subjective:   Jordan Patterson is a 80 y.o. female who presents for Medicare Annual (Subsequent) preventive examination.  Review of Systems: N/A      I connected with the patient today by telephone and verified that I am speaking with the correct person using two identifiers. Location patient: home Location nurse: work Persons participating in the telephone visit: patient, nurse.   I discussed the limitations, risks, security and privacy concerns of performing an evaluation and management service by telephone and the availability of in person appointments. I also discussed with the patient that there may be a patient responsible charge related to this service. The patient expressed understanding and verbally consented to this telephonic visit.        Cardiac Risk Factors include: advanced age (>34men, >18 women);diabetes mellitus;hypertension     Objective:    Today's Vitals   There is no height or weight on file to calculate BMI.  Advanced Directives 03/12/2020 03/05/2019 02/27/2018 02/09/2017 02/09/2016 08/30/2014 06/25/2011  Does Patient Have a Medical Advance Directive? Yes Yes Yes Yes Yes Yes Patient has advance directive, copy not in chart  Type of Advance Directive Healthcare Power of [place];Living will Healthcare Power of [place];Living will Healthcare Power of [place];Living will Healthcare Power of [place];Living will Healthcare Power of [place];Living will Healthcare Power of [place];Living will Healthcare Power of [place];Living will  Copy of Healthcare Power of Attorney in Chart? No - copy requested Yes - validated most recent copy scanned in chart (See row information) Yes - validated most recent copy scanned in chart (See row information) Yes Yes - -  Pre-existing out of facility DNR order (yellow form or pink MOST form) - - - - - - Other (comment)    Current Medications (verified) Outpatient Encounter Medications as of 03/12/2020  Medication Sig  . albuterol  (VENTOLIN HFA) 108 (90 Base) MCG/ACT inhaler TAKE 2 PUFFS BY MOUTH EVERY 6 HOURS AS NEEDED FOR WHEEZE OR SHORTNESS OF BREATH  . Aloe Vera 25 MG CAPS Take 25 mg by mouth daily.  Marland Kitchen amLODipine (NORVASC) 5 MG tablet TAKE 1 TABLET BY MOUTH EVERY DAY  . aspirin 81 MG tablet Take 81 mg by mouth daily.  . Budeson-Glycopyrrol-Formoterol (BREZTRI AEROSPHERE) 160-9-4.8 MCG/ACT AERO Inhale 2 puffs into the lungs 2 (two) times daily.  . chlorpheniramine (CHLOR-TRIMETON) 4 MG tablet Take 4 mg by mouth 2 (two) times daily as needed for allergies.  . chlorthalidone (HYGROTON) 25 MG tablet TAKE 0.5 TABLETS (12.5 MG TOTAL) BY MOUTH EVERY MONDAY, WEDNESDAY, AND FRIDAY.  Marland Kitchen Cholecalciferol (VITAMIN D3) 1000 units CAPS Take 1 capsule (1,000 Units total) by mouth daily.  . diphenhydramine-acetaminophen (TYLENOL PM) 25-500 MG TABS tablet Take 1 tablet by mouth at bedtime as needed.  Marland Kitchen esomeprazole (NEXIUM) 40 MG capsule TAKE 1 CAPSULE BY MOUTH EVERY DAY BEFORE BREAKFAST  . famotidine (PEPCID) 20 MG tablet Take 1 tablet (20 mg total) by mouth at bedtime.  . Garlic (GARLIQUE PO) Take 1 tablet by mouth daily.  Marland Kitchen levothyroxine (SYNTHROID) 100 MCG tablet TAKE 1 TABLET BY MOUTH EVERY MORNING WITH BREAKFAST  . MAGNESIUM PO Take by mouth.  . metFORMIN (GLUCOPHAGE) 500 MG tablet TAKE 1 TABLET BY MOUTH TWICE A DAY WITH MEALS  . montelukast (SINGULAIR) 10 MG tablet TAKE 1 TABLET BY MOUTH EVERYDAY AT BEDTIME  . Multiple Vitamins-Minerals (MULTIVITAMIN PO) Take 1 tablet by mouth daily.  . polyethylene glycol (MIRALAX / GLYCOLAX) packet Take 8.5 g by mouth once a week.   . Polyvinyl  Alcohol-Povidone (REFRESH OP) Place 1 drop into both eyes daily.   . Saline 0.9 % AERS Place into the nose as needed.  Marland Kitchen Specialty Vitamins Products (CARDIOVASCULAR SUPPORT PO) Take by mouth daily.  . valsartan (DIOVAN) 320 MG tablet Take 1 tablet (320 mg total) by mouth daily.   No facility-administered encounter medications on file as of 03/12/2020.     Allergies (verified) Ace inhibitors, Amlodipine, Crestor [rosuvastatin], Pregabalin, Tegretol [carbamazepine], Wellbutrin [bupropion hcl], Cymbalta [duloxetine hcl], and Lipitor [atorvastatin]   History: Past Medical History:  Diagnosis Date  . Allergic rhinitis   . Arthritis    hands and knees - ?osteo  . Asthma   . Chronic headaches   . Compression fracture of T12 vertebra (HCC) 07/18/2014   S/p kyphoplasty by Dr Newell Coral (08/2014)   . Depression    pt denies  . Diabetes type 2, controlled (HCC) 2004  . Environmental allergies    dust,mold,mildew  . Extrinsic asthma   . GERD (gastroesophageal reflux disease)    [name] EGD - mild esophageal dysmotility and small hiatal hernia  . Hiatal hernia   . Hx of migraines   . Hyperlipidemia    mild, diet controlled  . Hypertension   . Hypothyroidism   . Macular degeneration   . Osteoporosis with fracture 07/2014   T -1.2 hip, T 0.2 spine, T12 compression fracture s/p kyphoplasty   Past Surgical History:  Procedure Laterality Date  . 24 HOUR PH STUDY N/A 04/11/2017   Procedure: 24 HOUR PH STUDY;  Surgeon: Napoleon Form, MD;  Location: [facility];  Service: Endoscopy;  Laterality: N/A;  . CATARACT EXTRACTION  2003   bilateral with lens implant  . COLONOSCOPY    . DEXA  05/2008   improved (initial DEXA 2007 - mild osteopenia)  . DILATION AND CURETTAGE OF UTERUS  1978  . ESOPHAGEAL MANOMETRY N/A 04/11/2017   Procedure: ESOPHAGEAL MANOMETRY (EM);  Surgeon: Napoleon Form, MD;  Location: [facility];  Service: Endoscopy;  Laterality: N/A;  . ESOPHAGOGASTRODUODENOSCOPY  06/25/2011   Procedure: ESOPHAGOGASTRODUODENOSCOPY (EGD);  Surgeon: Hart Carwin, MD;  Location: [facility];  Service: Endoscopy;  Laterality: N/A;  no Xray  . KYPHOPLASTY N/A 08/30/2014   Procedure: THORACIC TWELVE KYPHOPLASTY;  Surgeon: [name], MD  . LAPAROSCOPY ABDOMEN DIAGNOSTIC     To R/O endometriosis  . PH IMPEDANCE STUDY N/A 04/11/2017    Procedure: PH IMPEDANCE STUDY;  Surgeon: Napoleon Form, MD;  Location: [facility];  Service: Endoscopy;  Laterality: N/A;  . SAVORY DILATION  06/25/2011   Procedure: SAVORY DILATION;  Surgeon: Hart Carwin, MD;  Location: [facility];  Service: Endoscopy;  Laterality: N/A;  . TONSILLECTOMY AND ADENOIDECTOMY  1962  . US ECHOCARDIOGRAPHY  10/2014   nl systolic function EF 55%, mild diastolic dysfunction   Family History  Problem Relation Age of Onset  . Mitral valve prolapse Father   . Diabetes Father   . Mitral valve prolapse Mother   . Hypertension Mother   . Stroke Mother 70       after valve surgery  . Parkinson's disease Brother   . Other Brother        POST WAR TRAUMA  . Stroke Paternal Grandfather   . Heart failure Maternal Grandfather   . Cancer Neg Hx   . Colon cancer Neg Hx   . Stomach cancer Neg Hx   . Esophageal cancer Neg Hx   . Pancreatic cancer Neg Hx   . Liver  disease Neg Hx    Social History   Socioeconomic History  . Marital status: Widowed    Spouse name: Not on file  . Number of children: 3  . Years of education: Not on file  . Highest education level: Not on file  Occupational History  . Occupation: Retired  Tobacco Use  . Smoking status: Never Smoker  . Smokeless tobacco: Never Used  Vaping Use  . Vaping Use: Never used  Substance and Sexual Activity  . Alcohol use: No    Alcohol/week: 0.0 standard drinks  . Drug use: No  . Sexual activity: Never  Other Topics Concern  . 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 Difficulty of Paying Living Expenses: Not hard at all  Food  Insecurity: No Food Insecurity  . Worried About Programme researcher, broadcasting/film/video in the Last Year: Never true  . Ran Out of Food in the Last Year: Never true  Transportation Needs: No Transportation Needs  . Lack of Transportation (Medical): No  . Lack of Transportation (Non-Medical): No  Physical Activity: Inactive  . Days of Exercise per Week: 0 days  . Minutes of Exercise per Session: 0 min  Stress: No Stress Concern Present  . Feeling of Stress : Not at all  Social Connections: Not on file    Tobacco Counseling Counseling given: Not Answered   Clinical Intake:  Pre-visit preparation completed: Yes  Pain : No/denies pain     Nutritional Risks: None Diabetes: Yes CBG done?: No Did pt. bring in CBG monitor from home?: No  How often do you need to have someone help you when you read instructions, pamphlets, or other written materials from your doctor or pharmacy?: 1 - Never What is the last grade level you completed in school?: some college  Diabetic: Yes Nutrition Risk Assessment:  Has the patient had any N/V/D within the last 2 months?  No  Does the patient have any non-healing wounds?  No  Has the patient had any unintentional weight loss or weight gain?  No   Diabetes:  Is the patient diabetic?  Yes  If diabetic, was a CBG obtained today?  No  telephone visit  Did the patient bring in their glucometer from home?  No  telephone visit  How often do you monitor your CBG's? never.   Financial Strains and Diabetes Management:  Are you having any financial strains with the device, your supplies or your medication? No .  Does the patient want to be seen by Chronic Care Management for management of their diabetes?  No  Would the patient like to be referred to a Nutritionist or for Diabetic Management?  No   Diabetic Exams:  Diabetic Eye Exam: Completed [id number] Diabetic Foot Exam: Overdue, Pt has been advised about the importance in completing this exam. Pt is scheduled for  diabetic foot exam on 03/19/2020.   Interpreter Needed?: No  Information entered by :: CJohnson, LPN   Activities of Daily Living In your present state of health, do you have any difficulty performing the following activities: 03/12/2020  Hearing? Y  Comment has hearing aids- does not wear at home  Vision? N  Difficulty concentrating or making decisions? N  Walking or climbing stairs? N  Dressing or bathing? N  Doing errands, shopping? N  Preparing Food and eating ? N  Using the Toilet? N  In the past six months, have you accidently leaked urine? Y  Comment wears liners when going out  Do you have problems with loss of bowel control? N  Managing your Medications? N  Managing your Finances? N  Housekeeping or managing your Housekeeping? N  Some recent data might be hidden    Patient Care Team: Emi Belfast, FNP (Inactive) as PCP - General (Nurse Practitioner) Lyn Records, MD as PCP - Cardiology (Cardiology) Eber Jones, MD as Referring Physician (Ophthalmology) Waymon Budge, MD as Consulting Physician (Pulmonary Disease) Meylor, August Saucer, DC as Consulting Physician (Chiropractic Medicine) Richrd Humbles, DDS as Consulting Physician (Dentistry)  Indicate any recent Medical Services you may have received from other than [facility] providers in the past year (date may be approximate).     Assessment:   This is a routine wellness examination for Vandy.  Hearing/Vision screen  Hearing Screening   125Hz  250Hz  500Hz  1000Hz  2000Hz  3000Hz  4000Hz  6000Hz  8000Hz   Right ear:           Left ear:           Vision Screening Comments: Patient gets annual eye exams   Dietary issues and exercise activities discussed: Current Exercise Habits: The patient does not participate in regular exercise at present, Exercise limited by: None identified  Goals    . Increase physical activity     Weather permitting, I will continue to walk at least 30 minutes 5-6 days per week.     . Patient  Stated     03/05/2019, I will maintain and continue medications as prescribed.    . Patient Stated     03/12/2020, I will maintain and continue medications as prescribed.       Depression Screen PHQ 2/9 Scores 03/12/2020 09/24/2019 03/12/2019 03/05/2019 02/27/2018 02/09/2017 02/09/2016  PHQ - 2 Score 0 0 0 0 0 0 0  PHQ- 9 Score 0 - 1 0 0 0 -    Fall Risk Fall Risk  03/12/2020 03/05/2019 02/27/2018 02/09/2017 02/09/2016  Falls in the past year? 0 0 0 No No  Number falls in past yr: 0 0 - - -  Injury with Fall? 0 0 - - -  Comment - - - - -  Risk for fall due to : Medication side effect Medication side effect - - -  Follow up Falls evaluation completed;Falls prevention discussed Falls evaluation completed;Falls prevention discussed - - -    FALL RISK PREVENTION PERTAINING TO THE HOME:  Any stairs in or around the home? Yes  If so, are there any without handrails? No  Home free of loose throw rugs in walkways, pet beds, electrical cords, etc? Yes  Adequate lighting in your home to reduce risk of falls? Yes   ASSISTIVE DEVICES UTILIZED TO PREVENT FALLS:  Life alert? No  Use of a cane, walker or w/c? No  Grab bars in the bathroom? Yes  Shower chair or bench in shower? No  Elevated toilet seat or a handicapped toilet? No   TIMED UP AND GO:  Was the test performed? N/A telephone visit .   Cognitive Function: MMSE - Mini Mental State Exam 03/12/2020 03/05/2019 02/27/2018 02/09/2017 02/09/2016  Orientation  to time 5 5 5 5 5   Orientation to Place 5 5 5 5 5   Registration 3 3 3 3 3   Attention/ Calculation 5 5 0 0 0  Recall 3 3 3 3 3   Language- name 2 objects - - 0 0 0  Language- repeat 1 1 1 1 1   Language- follow 3 step command - - 3 3 3   Language- read & follow direction - - 0 0 0  Write a sentence - - 0 0 0  Copy design - - 0 0 0  Total score - - 20 20 20   Mini Cog  Mini-Cog screen was completed. Maximum score is 22. A value of 0 denotes this part of the MMSE was not completed or the  patient failed this part of the Mini-Cog screening.       Immunizations Immunization History  Administered Date(s) Administered  . Fluad Quad(high Dose 65+) 12/08/2019  . Influenza Whole 11/23/2011, 12/06/2012  . Influenza, High Dose Seasonal PF 11/23/2018  . Influenza,inj,Quad PF,6+ Mos 11/06/2014, 10/21/2015, 11/23/2016, 11/23/2017, 11/09/2018  . Influenza-Unspecified 11/22/2013  . PFIZER(Purple Top)SARS-COV-2 Vaccination 06/01/2019, 06/27/2019  . Pneumococcal Conjugate-13 05/29/2015  . Pneumococcal Polysaccharide-23 10/23/2005  . Td 01/14/2012    TDAP status: Up to date  Flu Vaccine status: Up to date  Pneumococcal vaccine status: Up to date  Covid-19 vaccine status: completed 2 doses, booster is due. Patient aware and will schedule appointment soon.   Qualifies for Shingles Vaccine? Yes   Zostavax completed No   Shingrix Completed?: No.    Education has been provided regarding the importance of this vaccine. Patient has been advised to call insurance company to determine out of pocket expense if they have not yet received this vaccine. Advised may also receive vaccine at local pharmacy or Health Dept. Verbalized acceptance and understanding.  Screening Tests Health Maintenance  Topic Date Due  . Hepatitis C Screening  Never done  . FOOT EXAM  03/11/2019  . COVID-19 Vaccine (3 - Booster for Pfizer series) 12/28/2019  . HEMOGLOBIN A1C  03/26/2020  . OPHTHALMOLOGY EXAM  11/22/2020  . TETANUS/TDAP  01/13/2022  . INFLUENZA VACCINE  Completed  . DEXA SCAN  Completed  . PNA vac Low Risk Adult  Completed    Health Maintenance  Health Maintenance Due  Topic Date Due  . Hepatitis C Screening  Never done  . FOOT EXAM  03/11/2019  . COVID-19 Vaccine (3 - Booster for Pfizer series) 12/28/2019    Colorectal cancer screening: No longer required.   Mammogram status: No longer required due to age.  Bone Density status: declined  Lung Cancer Screening: (Low Dose CT Chest  recommended if Age 69-80 years, 30 pack-year currently smoking OR have quit w/in 15years.) does not qualify.   Additional Screening:  Hepatitis C Screening: does qualify; Completed due  Vision Screening: Recommended annual ophthalmology exams for early detection of glaucoma and other disorders of the eye. Is the patient up to date with their annual eye exam?  Yes  Who is the provider or what is the name of the office in which the patient attends annual eye exams? Dr. Sherryll BurgerShah If pt is not established with a provider, would they like to be referred to a provider to establish care? No .   Dental Screening: Recommended annual dental exams for proper oral hygiene  Community Resource Referral / Chronic Care Management: CRR required this visit?  No   CCM required this visit?  No  Plan:     I have personally reviewed and noted the following in the patient's chart:   . Medical and social history . Use of alcohol, tobacco or illicit drugs  . Current medications and supplements . Functional ability and status . Nutritional status . Physical activity . Advanced directives . List of other physicians . Hospitalizations, surgeries, and ER visits in previous 12 months . Vitals . Screenings to include cognitive, depression, and falls . Referrals and appointments  In addition, I have reviewed and discussed with patient certain preventive protocols, quality metrics, and best practice recommendations. A written personalized care plan for preventive services as well as general preventive health recommendations were provided to patient.   Due to this being a telephonic visit, the after visit summary with patients personalized plan was offered to patient via office or my-chart. Patient preferred to pick up at office at next visit or via mychart.   Janalyn ShyJohnson, Shantasia Hunnell, LPN   [id number]/46961/19/2022

## 2020-03-14 ENCOUNTER — Other Ambulatory Visit: Payer: Medicare PPO

## 2020-03-17 ENCOUNTER — Other Ambulatory Visit: Payer: Self-pay

## 2020-03-17 ENCOUNTER — Other Ambulatory Visit (INDEPENDENT_AMBULATORY_CARE_PROVIDER_SITE_OTHER): Payer: Medicare PPO

## 2020-03-17 DIAGNOSIS — E039 Hypothyroidism, unspecified: Secondary | ICD-10-CM | POA: Diagnosis not present

## 2020-03-17 DIAGNOSIS — M8000XD Age-related osteoporosis with current pathological fracture, unspecified site, subsequent encounter for fracture with routine healing: Secondary | ICD-10-CM

## 2020-03-17 DIAGNOSIS — Z79899 Other long term (current) drug therapy: Secondary | ICD-10-CM

## 2020-03-17 DIAGNOSIS — E785 Hyperlipidemia, unspecified: Secondary | ICD-10-CM | POA: Diagnosis not present

## 2020-03-17 DIAGNOSIS — I10 Essential (primary) hypertension: Secondary | ICD-10-CM

## 2020-03-17 DIAGNOSIS — E119 Type 2 diabetes mellitus without complications: Secondary | ICD-10-CM

## 2020-03-17 LAB — COMPREHENSIVE METABOLIC PANEL
ALT: 25 U/L (ref 0–35)
AST: 20 U/L (ref 0–37)
Albumin: 4.3 g/dL (ref 3.5–5.2)
Alkaline Phosphatase: 72 U/L (ref 39–117)
BUN: 15 mg/dL (ref 6–23)
CO2: 31 mEq/L (ref 19–32)
Calcium: 9.5 mg/dL (ref 8.4–10.5)
Chloride: 97 mEq/L (ref 96–112)
Creatinine, Ser: 0.82 mg/dL (ref 0.40–1.20)
GFR: 68.03 mL/min (ref >60.00)
Glucose, Bld: 129 mg/dL — ABNORMAL HIGH (ref 70–99)
Potassium: 4.7 mEq/L (ref 3.5–5.1)
Sodium: 135 mEq/L (ref 135–145)
Total Bilirubin: 0.5 mg/dL (ref 0.2–1.2)
Total Protein: 6.7 g/dL (ref 6.0–8.3)

## 2020-03-17 LAB — CBC WITH DIFFERENTIAL/PLATELET
Basophils Absolute: 0 10*3/uL (ref 0.0–0.1)
Basophils Relative: 0.5 % (ref 0.0–3.0)
Eosinophils Absolute: 0.1 10*3/uL (ref 0.0–0.7)
Eosinophils Relative: 2.3 % (ref 0.0–5.0)
HCT: 38 % (ref 36.0–46.0)
Hemoglobin: 12.6 g/dL (ref 12.0–15.0)
Lymphocytes Relative: 43.7 % (ref 12.0–46.0)
Lymphs Abs: 2.7 10*3/uL (ref 0.7–4.0)
MCHC: 33.1 g/dL (ref 30.0–36.0)
MCV: 87 fl (ref 78.0–100.0)
Monocytes Absolute: 0.5 10*3/uL (ref 0.1–1.0)
Monocytes Relative: 7.7 % (ref 3.0–12.0)
Neutro Abs: 2.9 10*3/uL (ref 1.4–7.7)
Neutrophils Relative %: 45.8 % (ref 43.0–77.0)
Platelets: 205 10*3/uL (ref 150.0–400.0)
RBC: 4.37 Mil/uL (ref 3.87–5.11)
RDW: 14.7 % (ref 11.5–15.5)
WBC: 6.3 10*3/uL (ref 4.0–10.5)

## 2020-03-17 LAB — HEMOGLOBIN A1C: Hgb A1c MFr Bld: 7.2 % — ABNORMAL HIGH (ref 4.6–6.5)

## 2020-03-17 LAB — LDL CHOLESTEROL, DIRECT: Direct LDL: 164 mg/dL

## 2020-03-17 LAB — LIPID PANEL
Cholesterol: 250 mg/dL — ABNORMAL HIGH (ref 0–200)
HDL: 53.1 mg/dL (ref >39.00)
NonHDL: 197.23
Total CHOL/HDL Ratio: 5
Triglycerides: 230 mg/dL — ABNORMAL HIGH (ref 0.0–149.0)
VLDL: 46 mg/dL — ABNORMAL HIGH (ref 0.0–40.0)

## 2020-03-17 LAB — VITAMIN B12: Vitamin B-12: 1145 pg/mL — ABNORMAL HIGH (ref 211–911)

## 2020-03-17 LAB — TSH: TSH: 6.03 u[IU]/mL — ABNORMAL HIGH (ref 0.35–4.50)

## 2020-03-17 LAB — VITAMIN D 25 HYDROXY (VIT D DEFICIENCY, FRACTURES): VITD: 55.9 ng/mL (ref 30.00–100.00)

## 2020-03-19 ENCOUNTER — Encounter: Payer: Self-pay | Admitting: Family Medicine

## 2020-03-19 ENCOUNTER — Other Ambulatory Visit: Payer: Self-pay

## 2020-03-19 ENCOUNTER — Ambulatory Visit: Payer: Medicare PPO | Admitting: Family Medicine

## 2020-03-19 ENCOUNTER — Ambulatory Visit (INDEPENDENT_AMBULATORY_CARE_PROVIDER_SITE_OTHER): Payer: Medicare PPO | Admitting: Family Medicine

## 2020-03-19 VITALS — BP 132/80 | HR 59 | Temp 98.2°F | Ht 67.3 in | Wt 182.0 lb

## 2020-03-19 DIAGNOSIS — E119 Type 2 diabetes mellitus without complications: Secondary | ICD-10-CM | POA: Diagnosis not present

## 2020-03-19 DIAGNOSIS — E039 Hypothyroidism, unspecified: Secondary | ICD-10-CM

## 2020-03-19 DIAGNOSIS — I10 Essential (primary) hypertension: Secondary | ICD-10-CM

## 2020-03-19 DIAGNOSIS — M8000XD Age-related osteoporosis with current pathological fracture, unspecified site, subsequent encounter for fracture with routine healing: Secondary | ICD-10-CM

## 2020-03-19 DIAGNOSIS — Z0000 Encounter for general adult medical examination without abnormal findings: Secondary | ICD-10-CM | POA: Diagnosis not present

## 2020-03-19 DIAGNOSIS — E785 Hyperlipidemia, unspecified: Secondary | ICD-10-CM

## 2020-03-19 DIAGNOSIS — E1169 Type 2 diabetes mellitus with other specified complication: Secondary | ICD-10-CM

## 2020-03-19 DIAGNOSIS — Z79899 Other long term (current) drug therapy: Secondary | ICD-10-CM

## 2020-03-19 DIAGNOSIS — K219 Gastro-esophageal reflux disease without esophagitis: Secondary | ICD-10-CM

## 2020-03-19 DIAGNOSIS — E871 Hypo-osmolality and hyponatremia: Secondary | ICD-10-CM

## 2020-03-19 NOTE — Assessment & Plan Note (Signed)
Lab Results  Component Value Date   HGBA1C 7.2 (H) 03/17/2020   Goal of under 7  Good exercise and diet is fair  Pt declines an inc in metformin  Will re check in 3 months  Eye exam is utd  Nl foot exam  Re check 3 mo

## 2020-03-19 NOTE — Assessment & Plan Note (Signed)
Reviewed health habits including diet and exercise and skin cancer prevention Reviewed appropriate screening tests for age  Also reviewed health mt list, fam hx and immunization status , as well as social and family history   See HPI Labs reviewed covid immunized Declines shingrix vaccine  dexa 12/18 and declines a e check  Also declines mammogram or any colon cancer screening

## 2020-03-19 NOTE — Progress Notes (Signed)
Subjective:    Patient ID: Jordan HorsemanJean C Patterson, female    DOB: November 20, 1940, 80 y.o.   MRN: [id number]  This visit occurred during the SARS-CoV-2 public health emergency.  Safety protocols were in place, including screening questions prior to the visit, additional usage of staff PPE, and extensive cleaning of exam room while observing appropriate contact time as indicated for disinfecting solutions.    HPI 80 yo pt of NP Leone PayorGessner presents to transfer care and for health mt exam   Wt Readings from Last 3 Encounters:  03/19/20 182 lb (82.6 kg)  11/22/19 179 lb 9.6 oz (81.5 kg)  09/24/19 176 lb 4 oz (79.9 kg)   28.25 kg/m   amw was 1/19  Noted covid booster was due and declined dexa and shingrix  covid booster planned for feb   covid vaccinated shingrix- declined  Eye exam 10/21  dexa 12/18 osteopenia (done at [facility]) - declines another  H/o OP with h/o spinal fx and kyphoplasty Took forteo for 2 y  D level is 55.9  She declines further dexa or treatment  No new falls or fractures  Exercise - gets out and walks when weather permits  3 miles per day   Breast cancer screen   Mammogram 2018- declines another  Self breast exam - no breast lumps  HTN  BP Readings from Last 3 Encounters:  03/19/20 132/80  12/11/19 128/76  11/22/19 122/72    Pulse Readings from Last 3 Encounters:  03/19/20 (!) 59  12/11/19 86  11/22/19 91    Amlodipine 5 mg daily Chlorthalidone 12.5 mg three days weekly Valsartan 320 mg   Sees cardiology in the spring   Hypothyroidism  Pt has no clinical changes No change in energy level/ hair or skin/ edema and no tremor Lab Results  Component Value Date   TSH 6.03 (H) 03/17/2020    Takes levothy 100 mcg daily  No missed doses  Taken correctly    DM2 Lab Results  Component Value Date   HGBA1C 7.2 (H) 03/17/2020   No highs or lows per pt - does not check her sugar  Exercise is great  Diet is good overall -eats healthy meals  Avoids sugar  drinks  No regular  Metformin 500 mg bid (does not want to inc this)  Not interested in glucose checking yet -perhaps later   H/o hyponatremia Lab Results  Component Value Date   CREATININE 0.82 03/17/2020   BUN 15 03/17/2020   NA 135 03/17/2020   K 4.7 03/17/2020   CL 97 03/17/2020   CO2 31 03/17/2020   Hyperlipidemia  Intol of statins  Lab Results  Component Value Date   CHOL 250 (H) 03/17/2020   CHOL 233 (H) 03/05/2019   CHOL 248 (H) 02/27/2018   Lab Results  Component Value Date   HDL 53.10 03/17/2020   HDL 52.10 03/05/2019   HDL 53.80 02/27/2018   Lab Results  Component Value Date   LDLCALC 156 (H) 03/05/2019   LDLCALC 166 (H) 02/27/2018   LDLCALC 168 (H) 02/09/2017   Lab Results  Component Value Date   TRIG 230.0 (H) 03/17/2020   TRIG 124.0 03/05/2019   TRIG 141.0 02/27/2018   Lab Results  Component Value Date   CHOLHDL 5 03/17/2020   CHOLHDL 4 03/05/2019   CHOLHDL 5 02/27/2018   Lab Results  Component Value Date   LDLDIRECT 164.0 03/17/2020   LDLDIRECT 169.0 02/27/2018   LDLDIRECT 187.0 02/09/2017  takes garliqe Intol  of statin  Has tried other meds - with cardiology /intolerant   GERD Takes nexium and pepcid  Not well controlled  Has GI f/u next month   Takes miralax once per week  Wants to take more - twice per week    Lab Results  Component Value Date   VITAMINB12 1,145 (H) 03/17/2020  unsure where she gets her B12 Lab Results  Component Value Date   WBC 6.3 03/17/2020   HGB 12.6 03/17/2020   HCT 38.0 03/17/2020   MCV 87.0 03/17/2020   PLT 205.0 03/17/2020   Lab Results  Component Value Date   ALT 25 03/17/2020   AST 20 03/17/2020   ALKPHOS 72 03/17/2020   BILITOT 0.5 03/17/2020   A lot of cramping hands/feet Takes mag  Takes ca  Uses blue emu otc -helps some   Patient Active Problem List   Diagnosis Date Noted  . Current use of proton pump inhibitor 03/11/2020  . Chronic midline low back pain without sciatica  03/12/2019  . 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 Hyponatremia 12/14/2018  . Laryngopharyngeal reflux (LPR) 08/24/2017  . Osteoporosis 08/24/2017  . Leg cramping 08/21/2017  . Regurgitation of food   . Prolapse of female pelvic organs 02/19/2017  . Compression fracture of T12 vertebra (HCC) 07/18/2014  . Hypertensive retinopathy of both eyes 06/04/2014  . 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 Posterior vitreous detachment of both eyes 06/04/2014  . Pseudophakia of both eyes 06/04/2014  . Right knee pain 05/30/2014  . Advanced care planning/counseling discussion 01/24/2014  . Health maintenance examination 01/24/2014  . Dysgeusia 11/12/2013  . Upper airway cough syndrome 03/19/2013  . Allergic conjunctivitis and rhinitis 05/15/2012  . Asthma, moderate persistent 03/09/2012  . Medicare annual wellness visit, subsequent 01/14/2012  . Hiatal hernia   . GERD (gastroesophageal reflux disease)   . Essential hypertension   . Hyperlipidemia associated with type 2 diabetes mellitus (HCC)   . Depression   . Hypothyroidism   . Osteoporosis with fracture   . Diabetes type 2, controlled (HCC)   . Osteoarthritis    Past Medical History:  Diagnosis Date  . Allergic rhinitis   . Arthritis    hands and knees - ?osteo  . Asthma   . Chronic headaches   . Compression fracture of T12 vertebra (HCC) 07/18/2014   S/p kyphoplasty by Dr Newell Coral (08/2014)   . Depression    pt denies  . Diabetes type 2, controlled (HCC) 2004  . Environmental allergies    dust,mold,mildew  . Extrinsic asthma   . GERD (gastroesophageal reflux disease)    [name] EGD - mild esophageal dysmotility and small hiatal hernia  . Hiatal hernia   . Hx of migraines   . Hyperlipidemia    mild, diet controlled  . Hypertension   . Hypothyroidism   . Macular degeneration   . Osteoporosis with fracture 07/2014   T -1.2 hip, T 0.2 spine, T12 compression fracture s/p kyphoplasty   Past Surgical  History:  Procedure Laterality Date  . 24 HOUR PH STUDY N/A 04/11/2017   Procedure: 24 HOUR PH STUDY;  Surgeon: Napoleon Form, MD;  Location: [facility];  Service: Endoscopy;  Laterality: N/A;  . CATARACT EXTRACTION  2003   bilateral with lens implant  . COLONOSCOPY    . DEXA  05/2008   improved (initial DEXA 2007 - mild osteopenia)  . DILATION AND CURETTAGE OF UTERUS  1978  . ESOPHAGEAL MANOMETRY N/A 04/11/2017   Procedure: ESOPHAGEAL  MANOMETRY (EM);  Surgeon: Napoleon Form, MD;  Location: [facility];  Service: Endoscopy;  Laterality: N/A;  . ESOPHAGOGASTRODUODENOSCOPY  06/25/2011   Procedure: ESOPHAGOGASTRODUODENOSCOPY (EGD);  Surgeon: Hart Carwin, MD;  Location: [facility];  Service: Endoscopy;  Laterality: N/A;  no Xray  . KYPHOPLASTY N/A 08/30/2014   Procedure: THORACIC TWELVE KYPHOPLASTY;  Surgeon: [name], MD  . LAPAROSCOPY ABDOMEN DIAGNOSTIC     To R/O endometriosis  . PH IMPEDANCE STUDY N/A 04/11/2017   Procedure: PH IMPEDANCE STUDY;  Surgeon: Napoleon Form, MD;  Location: [facility];  Service: Endoscopy;  Laterality: N/A;  . SAVORY DILATION  06/25/2011   Procedure: SAVORY DILATION;  Surgeon: Hart Carwin, MD;  Location: [facility];  Service: Endoscopy;  Laterality: N/A;  . TONSILLECTOMY AND ADENOIDECTOMY  1962  . US ECHOCARDIOGRAPHY  10/2014   nl systolic function EF 55%, mild diastolic dysfunction   Social History   Tobacco Use  . Smoking status: Never Smoker  . Smokeless tobacco: Never Used  Vaping Use  . Vaping Use: Never used  Substance Use Topics  . Alcohol use: No    Alcohol/week: 0.0 standard drinks  . Drug use: No   Family History  Problem Relation Age of Onset  . Mitral valve prolapse Father   . Diabetes Father   . Mitral valve prolapse Mother   . Hypertension Mother   . Stroke Mother 51       after valve surgery  . Parkinson's disease Brother   . Other Brother        POST WAR TRAUMA  . Stroke Paternal Grandfather   .  Heart failure Maternal Grandfather   . Cancer Neg Hx   . Colon cancer Neg Hx   . Stomach cancer Neg Hx   . Esophageal cancer Neg Hx   . Pancreatic cancer Neg Hx   . Liver disease Neg Hx    Allergies  Allergen Reactions  . Ace Inhibitors Cough  . Amlodipine Cough  . Crestor [Rosuvastatin] Other (See Comments)    myalgias  . Pregabalin Other (See Comments)    Unknown reaction  . Tegretol [Carbamazepine] Other (See Comments)    Dizziness, headache  . Wellbutrin [Bupropion Hcl] Other (See Comments)    Unknown reaction  . Cymbalta [Duloxetine Hcl] Palpitations and Other (See Comments)    headaches  . Lipitor [Atorvastatin] Cough   Current Outpatient Medications on File Prior to Visit  Medication Sig Dispense Refill  . albuterol (VENTOLIN HFA) 108 (90 Base) MCG/ACT inhaler TAKE 2 PUFFS BY MOUTH EVERY 6 HOURS AS NEEDED FOR WHEEZE OR SHORTNESS OF BREATH 18 g 11  . amLODipine (NORVASC) 5 MG tablet TAKE 1 TABLET BY MOUTH EVERY DAY 90 tablet 3  . aspirin 81 MG tablet Take 81 mg by mouth daily.    . Budeson-Glycopyrrol-Formoterol (BREZTRI AEROSPHERE) 160-9-4.8 MCG/ACT AERO Inhale 2 puffs into the lungs 2 (two) times daily. 32.1 g 4  . chlorpheniramine (CHLOR-TRIMETON) 4 MG tablet Take 4 mg by mouth 2 (two) times daily as needed for allergies.    . Cholecalciferol (VITAMIN D3) 1000 units CAPS Take 1 capsule (1,000 Units total) by mouth daily. 30 capsule   . diphenhydramine-acetaminophen (TYLENOL PM) 25-500 MG TABS tablet Take 1 tablet by mouth at bedtime as needed.    Marland Kitchen esomeprazole (NEXIUM) 40 MG capsule TAKE 1 CAPSULE BY MOUTH EVERY DAY BEFORE BREAKFAST 90 capsule 6  . famotidine (PEPCID) 20 MG tablet Take 1 tablet (20 mg total) by mouth  at bedtime. 90 tablet 6  . ferrous sulfate 325 (65 FE) MG EC tablet Take 325 mg by mouth 3 (three) times daily with meals.    . Garlic (GARLIQUE PO) Take 1 tablet by mouth daily.    Marland Kitchen levothyroxine (SYNTHROID) 100 MCG tablet TAKE 1 TABLET BY MOUTH EVERY  MORNING WITH BREAKFAST 30 tablet 2  . MAGNESIUM PO Take by mouth.    . metFORMIN (GLUCOPHAGE) 500 MG tablet TAKE 1 TABLET BY MOUTH TWICE A DAY WITH MEALS 180 tablet 1  . montelukast (SINGULAIR) 10 MG tablet TAKE 1 TABLET BY MOUTH EVERYDAY AT BEDTIME 30 tablet 5  . Omega-3 Fatty Acids (OMEGA 3 500 PO) Take by mouth.    . polyethylene glycol (MIRALAX / GLYCOLAX) packet Take 8.5 g by mouth once a week.     . Polyvinyl Alcohol-Povidone (REFRESH OP) Place 1 drop into both eyes daily.     . Saline 0.9 % AERS Place into the nose as needed.    Marland Kitchen Specialty Vitamins Products (CARDIOVASCULAR SUPPORT PO) Take by mouth daily.    . valsartan (DIOVAN) 320 MG tablet Take 1 tablet (320 mg total) by mouth daily. 90 tablet 2   No current facility-administered medications on file prior to visit.     Review of Systems  Constitutional: Positive for fatigue. Negative for activity change, appetite change, fever and unexpected weight change.  HENT: Negative for congestion, ear pain, rhinorrhea, sinus pressure and sore throat.   Eyes: Negative for pain, redness and visual disturbance.  Respiratory: Negative for cough, shortness of breath and wheezing.   Cardiovascular: Negative for chest pain and palpitations.       Chronic cough  Gastrointestinal: Positive for abdominal distention. Negative for abdominal pain, blood in stool, constipation and diarrhea.       Heartburn  Endocrine: Negative for polydipsia and polyuria.  Genitourinary: Negative for dysuria, frequency and urgency.  Musculoskeletal: Positive for arthralgias. Negative for back pain and myalgias.       Muscle cramps in hands/feet  Skin: Negative for pallor and rash.  Allergic/Immunologic: Negative for environmental allergies.  Neurological: Negative for dizziness, syncope and headaches.  Hematological: Negative for adenopathy. Does not bruise/bleed easily.  Psychiatric/Behavioral: Negative for decreased concentration and dysphoric mood. The patient  is not nervous/anxious.        Objective:   Physical Exam Constitutional:      General: She is not in acute distress.    Appearance: Normal appearance. She is well-developed and normal weight. She is not ill-appearing or diaphoretic.  HENT:     Head: Normocephalic and atraumatic.     Right Ear: Tympanic membrane, ear canal and external ear normal.     Left Ear: Tympanic membrane, ear canal and external ear normal.     Nose: Nose normal. No congestion.     Mouth/Throat:     Mouth: Mucous membranes are moist.     Pharynx: Oropharynx is clear. No posterior oropharyngeal erythema.  Eyes:     General: No scleral icterus.    Extraocular Movements: Extraocular movements intact.     Conjunctiva/sclera: Conjunctivae normal.     Pupils: Pupils are equal, round, and reactive to light.  Neck:     Thyroid: No thyromegaly.     Vascular: No carotid bruit or JVD.  Cardiovascular:     Rate and Rhythm: Normal rate and regular rhythm.     Pulses: Normal pulses.     Heart sounds: Normal heart sounds. No gallop.   Pulmonary:  Effort: Pulmonary effort is normal. No respiratory distress.     Breath sounds: Normal breath sounds. No wheezing.     Comments: Good air exch Chest:     Chest wall: No tenderness.  Abdominal:     General: Bowel sounds are normal. There is no distension or abdominal bruit.     Palpations: Abdomen is soft. There is no mass.     Tenderness: There is no abdominal tenderness.     Hernia: No hernia is present.  Genitourinary:    Comments: Breast exam: No mass, nodules, thickening, tenderness, bulging, retraction, inflamation, nipple discharge or skin changes noted.  No axillary or clavicular LA.     Musculoskeletal:        General: No tenderness. Normal range of motion.     Cervical back: Normal range of motion and neck supple. No rigidity. No muscular tenderness.     Right lower leg: No edema.     Left lower leg: No edema.     Comments: Mild kyphosis  Lymphadenopathy:      Cervical: No cervical adenopathy.  Skin:    General: Skin is warm and dry.     Coloration: Skin is not pale.     Findings: No erythema or rash.     Comments: Solar lentigines diffusely   Neurological:     Mental Status: She is alert. Mental status is at baseline.     Cranial Nerves: No cranial nerve deficit.     Motor: No abnormal muscle tone.     Coordination: Coordination normal.     Gait: Gait normal.     Deep Tendon Reflexes: Reflexes are normal and symmetric. Reflexes normal.  Psychiatric:        Mood and Affect: Mood normal.        Cognition and Memory: Cognition and memory normal.           Assessment & Plan:   Problem List Items Addressed This Visit      Cardiovascular and Mediastinum   Essential hypertension    bp in fair control at this time  BP Readings from Last 1 Encounters:  03/19/20 132/80   No changes needed Most recent labs reviewed  Disc lifstyle change with low sodium diet and exercise  Plan to continue amlodipine 5 mg and valsartan 320 mg daily with chlorthalidone 13.5 mg bid        Digestive   GERD (gastroesophageal reflux disease)    Per pt not well controlled Taking nexium and pepcid  GI f/u planned next month        Endocrine   Hyperlipidemia associated with type 2 diabetes mellitus (HCC)    Pt is entirely intolerant to statins ever low /intermittent dosing Has worked on this with cardiology - intol of any med so  Far  Takes garlique supplement  Disc goals for lipids and reasons to control them Rev last labs with pt Rev low sat fat diet in detail       Hypothyroidism    Hypothyroidism  Pt has no clinical changes No change in energy level/ hair or skin/ edema and no tremor Lab Results  Component Value Date   TSH 6.03 (H) 03/17/2020     Without any clinical changes (disc what to watch out for) decided to continue current dose of levothyroxine-100 mcg daily and re check tsh in 3 mo      Diabetes type 2, controlled (HCC)     Lab Results  Component Value Date  HGBA1C 7.2 (H) 03/17/2020   Goal of under 7  Good exercise and diet is fair  Pt declines an inc in metformin  Will re check in 3 months  Eye exam is utd  Nl foot exam  Re check 3 mo      Relevant Orders   Hemoglobin A1c     Musculoskeletal and Integument   Osteoporosis with fracture    dexa 12/18 Pt declines further eval or tx Past tx with forteo for 2 y  Hx of comp fracture with kyphoplasty  No new falls or fractures Good exercise  Takes vit D (tx level at 55.9)        Other   Health maintenance examination - Primary    Reviewed health habits including diet and exercise and skin cancer prevention Reviewed appropriate screening tests for age  Also reviewed health mt list, fam hx and immunization status , as well as social and family history   See HPI Labs reviewed covid immunized Declines shingrix vaccine  dexa 12/18 and declines a e check  Also declines mammogram or any colon cancer screening        Hyponatremia    Taking chlorhalidone three days weekly  Continue to monitor      Relevant Orders   TSH   Current use of proton pump inhibitor    Lab Results  Component Value Date   VITAMINB12 1,145 (H) 03/17/2020  will cut her supplementation in 1/2  Vit D is therapeutic

## 2020-03-19 NOTE — Assessment & Plan Note (Signed)
dexa 12/18 Pt declines further eval or tx Past tx with forteo for 2 y  Hx of comp fracture with kyphoplasty  No new falls or fractures Good exercise  Takes vit D (tx level at 55.9)

## 2020-03-19 NOTE — Patient Instructions (Addendum)
Get your covid booster when you can   Let's re check thyroid in 3 months   B12 level is high- check vitamin labels / you need to cut back on that   Mustard/tumeric may help cramps   Follow up with GI as planned for your reflux

## 2020-03-19 NOTE — Assessment & Plan Note (Signed)
Lab Results  Component Value Date   VITAMINB12 1,145 (H) 03/17/2020  will cut her supplementation in 1/2  Vit D is therapeutic

## 2020-03-19 NOTE — Assessment & Plan Note (Signed)
Per pt not well controlled Taking nexium and pepcid  GI f/u planned next month

## 2020-03-19 NOTE — Assessment & Plan Note (Signed)
Taking chlorhalidone three days weekly  Continue to monitor

## 2020-03-19 NOTE — Assessment & Plan Note (Signed)
Pt is entirely intolerant to statins ever low /intermittent dosing Has worked on this with cardiology - intol of any med so  Far  Takes garlique supplement  Disc goals for lipids and reasons to control them Rev last labs with pt Rev low sat fat diet in detail

## 2020-03-19 NOTE — Assessment & Plan Note (Signed)
Hypothyroidism  Pt has no clinical changes No change in energy level/ hair or skin/ edema and no tremor Lab Results  Component Value Date   TSH 6.03 (H) 03/17/2020     Without any clinical changes (disc what to watch out for) decided to continue current dose of levothyroxine-100 mcg daily and re check tsh in 3 mo

## 2020-03-19 NOTE — Assessment & Plan Note (Signed)
bp in fair control at this time  BP Readings from Last 1 Encounters:  03/19/20 132/80   No changes needed Most recent labs reviewed  Disc lifstyle change with low sodium diet and exercise  Plan to continue amlodipine 5 mg and valsartan 320 mg daily with chlorthalidone 13.5 mg bid

## 2020-04-16 ENCOUNTER — Encounter: Payer: Self-pay | Admitting: Gastroenterology

## 2020-04-16 ENCOUNTER — Ambulatory Visit: Payer: Medicare PPO | Admitting: Gastroenterology

## 2020-04-16 ENCOUNTER — Other Ambulatory Visit: Payer: Self-pay

## 2020-04-16 VITALS — BP 142/86 | HR 77 | Ht 67.3 in | Wt 184.0 lb

## 2020-04-16 DIAGNOSIS — R053 Chronic cough: Secondary | ICD-10-CM | POA: Diagnosis not present

## 2020-04-16 DIAGNOSIS — K219 Gastro-esophageal reflux disease without esophagitis: Secondary | ICD-10-CM

## 2020-04-16 NOTE — Progress Notes (Signed)
Jordan Patterson    [id number]    [birth year]  Primary Care Physician:Gessner, Binnie Rail, FNP (Inactive)  Referring Physician: No referring provider defined for this encounter.   Chief complaint:  GERD  HPI:  80 year old very pleasant female here for follow-up visit for chronic GERD and cough .  She is accompanied by her daughter  She is taking esomeprazole 40 mg daily and Pepcid at bedtime.  She is having breakthrough regurgitation and cough.  She feels her reflux symptoms are progressively worse in the past few months.  No heartburn, dysphagia, odynophagia, nausea, vomiting, loss of appetite or weight loss.  Denies any change in bowel habits, melena or blood per rectum.  EGD Jun 25, 2011:Irregular Z line with biopsies negative for intestinal metaplasia or Barrett's esophagus. Status post dilation with Maloney to 17 mm. Otherwise normal exam  Upper GI series November 2012:Small sliding-type hiatal hernia with moderate inducible gastroesophageal reflux. Mild smooth narrowing at the GE junction likely a lower esophageal mucosal ring. 30 mm barium pill passed into stomach without any difficulty. Normal appearance of stomach and duodenum.  Esophageal manometry February 2019:Normal  24-hour pH impedance on PPI and H2 blocker February 2019:Good acid suppression with DeMeester score 7.2, very few gastroesophageal reflux events , werepredominantly weekly acid reflux. Good symptom correlation for regurgitation with reflux events but no correlation for cough.  Negative Cologuard April 18, 2015   Outpatient Encounter Medications as of 04/16/2020  Medication Sig  . albuterol (VENTOLIN HFA) 108 (90 Base) MCG/ACT inhaler TAKE 2 PUFFS BY MOUTH EVERY 6 HOURS AS NEEDED FOR WHEEZE OR SHORTNESS OF BREATH  . amLODipine (NORVASC) 5 MG tablet TAKE 1 TABLET BY MOUTH EVERY DAY  . aspirin 81 MG tablet Take 81 mg by mouth daily.  . Budeson-Glycopyrrol-Formoterol (BREZTRI  AEROSPHERE) 160-9-4.8 MCG/ACT AERO Inhale 2 puffs into the lungs 2 (two) times daily.  . chlorpheniramine (CHLOR-TRIMETON) 4 MG tablet Take 4 mg by mouth 2 (two) times daily as needed for allergies.  . Cholecalciferol (VITAMIN D3) 1000 units CAPS Take 1 capsule (1,000 Units total) by mouth daily.  . diphenhydramine-acetaminophen (TYLENOL PM) 25-500 MG TABS tablet Take 1 tablet by mouth at bedtime as needed.  Marland Kitchen esomeprazole (NEXIUM) 40 MG capsule TAKE 1 CAPSULE BY MOUTH EVERY DAY BEFORE BREAKFAST  . famotidine (PEPCID) 20 MG tablet Take 1 tablet (20 mg total) by mouth at bedtime.  . ferrous sulfate 325 (65 FE) MG EC tablet Take 325 mg by mouth 3 (three) times daily with meals.  . Garlic (GARLIQUE PO) Take 1 tablet by mouth daily.  Marland Kitchen levothyroxine (SYNTHROID) 100 MCG tablet TAKE 1 TABLET BY MOUTH EVERY MORNING WITH BREAKFAST  . MAGNESIUM PO Take by mouth.  . metFORMIN (GLUCOPHAGE) 500 MG tablet TAKE 1 TABLET BY MOUTH TWICE A DAY WITH MEALS  . montelukast (SINGULAIR) 10 MG tablet TAKE 1 TABLET BY MOUTH EVERYDAY AT BEDTIME  . Omega-3 Fatty Acids (OMEGA 3 500 PO) Take by mouth.  . polyethylene glycol (MIRALAX / GLYCOLAX) packet Take 8.5 g by mouth once a week.   . Polyvinyl Alcohol-Povidone (REFRESH OP) Place 1 drop into both eyes daily.   . Saline 0.9 % AERS Place into the nose as needed.  Marland Kitchen Specialty Vitamins Products (CARDIOVASCULAR SUPPORT PO) Take by mouth daily.  . valsartan (DIOVAN) 320 MG tablet Take 1 tablet (320 mg total) by mouth daily.   No facility-administered encounter medications on file as of 04/16/2020.  Allergies as of 04/16/2020 - Review Complete 04/16/2020  Allergen Reaction Noted  . Ace inhibitors Cough 05/19/2011  . Amlodipine Cough 05/19/2011  . Crestor [rosuvastatin] Other (See Comments) 01/24/2014  . Pregabalin Other (See Comments) 05/19/2011  . Tegretol [carbamazepine] Other (See Comments) 05/19/2011  . Wellbutrin [bupropion hcl] Other (See Comments) 05/19/2011  .  Cymbalta [duloxetine hcl] Palpitations and Other (See Comments) 05/19/2011  . Lipitor [atorvastatin] Cough 03/26/2016    Past Medical History:  Diagnosis Date  . Allergic rhinitis   . Arthritis    hands and knees - ?osteo  . Asthma   . Chronic headaches   . Compression fracture of T12 vertebra (HCC) 07/18/2014   S/p kyphoplasty by Dr Newell Coral (08/2014)   . Depression    pt denies  . Diabetes type 2, controlled (HCC) 2004  . Environmental allergies    dust,mold,mildew  . Extrinsic asthma   . GERD (gastroesophageal reflux disease)    [name] EGD - mild esophageal dysmotility and small hiatal hernia  . Hiatal hernia   . Hx of migraines   . Hyperlipidemia    mild, diet controlled  . Hypertension   . Hypothyroidism   . Macular degeneration   . Osteoporosis with fracture 07/2014   T -1.2 hip, T 0.2 spine, T12 compression fracture s/p kyphoplasty    Past Surgical History:  Procedure Laterality Date  . 24 HOUR PH STUDY N/A 04/11/2017   Procedure: 24 HOUR PH STUDY;  Surgeon: Napoleon Form, MD;  Location: [facility];  Service: Endoscopy;  Laterality: N/A;  . CATARACT EXTRACTION  2003   bilateral with lens implant  . COLONOSCOPY    . DEXA  05/2008   improved (initial DEXA 2007 - mild osteopenia)  . DILATION AND CURETTAGE OF UTERUS  1978  . ESOPHAGEAL MANOMETRY N/A 04/11/2017   Procedure: ESOPHAGEAL MANOMETRY (EM);  Surgeon: Napoleon Form, MD;  Location: [facility];  Service: Endoscopy;  Laterality: N/A;  . ESOPHAGOGASTRODUODENOSCOPY  06/25/2011   Procedure: ESOPHAGOGASTRODUODENOSCOPY (EGD);  Surgeon: Hart Carwin, MD;  Location: [facility];  Service: Endoscopy;  Laterality: N/A;  no Xray  . KYPHOPLASTY N/A 08/30/2014   Procedure: THORACIC TWELVE KYPHOPLASTY;  Surgeon: [name], MD  . LAPAROSCOPY ABDOMEN DIAGNOSTIC     To R/O endometriosis  . PH IMPEDANCE STUDY N/A 04/11/2017   Procedure: PH IMPEDANCE STUDY;  Surgeon: Napoleon Form, MD;  Location: [facility];  Service: Endoscopy;  Laterality: N/A;  . SAVORY DILATION  06/25/2011   Procedure: SAVORY DILATION;  Surgeon: Hart Carwin, MD;  Location: [facility];  Service: Endoscopy;  Laterality: N/A;  . TONSILLECTOMY AND ADENOIDECTOMY  1962  . US ECHOCARDIOGRAPHY  10/2014   nl systolic function EF 55%, mild diastolic dysfunction    Family History  Problem Relation Age of Onset  . Mitral valve prolapse Father   . Diabetes Father   . Mitral valve prolapse Mother   . Hypertension Mother   . Stroke Mother 38       after valve surgery  . Parkinson's disease Brother   . Other Brother        POST WAR TRAUMA  . Stroke Paternal Grandfather   . Heart failure Maternal Grandfather   . Cancer Neg Hx   . Colon cancer Neg Hx   . Stomach cancer Neg Hx   . Esophageal cancer Neg Hx   . Pancreatic cancer Neg Hx   . Liver disease Neg Hx     Social History  Socioeconomic History  . Marital status: Widowed    Spouse name: Not on file  . Number of children: 3  . Years of education: Not on file  . Highest education level: Not on file  Occupational History  . Occupation: Retired  Tobacco Use  . Smoking status: Never Smoker  . Smokeless tobacco: Never Used  Vaping Use  . Vaping Use: Never used  Substance and Sexual Activity  . Alcohol use: No    Alcohol/week: 0.0 standard drinks  . Drug use: No  . Sexual activity: Never  Other Topics Concern  . 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 Difficulty of Paying Living Expenses: Not hard at all  Food Insecurity: No Food Insecurity  . Worried About Programme researcher, broadcasting/film/video in the Last  Year: Never true  . Ran Out of Food in the Last Year: Never true  Transportation Needs: No Transportation Needs  . Lack of Transportation (Medical): No  . Lack of Transportation (Non-Medical): No  Physical Activity: Inactive  . Days of Exercise per Week: 0 days  . Minutes of Exercise per Session: 0 min  Stress: No Stress Concern Present  . Feeling of Stress : Not at all  Social Connections: Not on file  Intimate Partner Violence: Not At Risk  . Fear of Current or Ex-Partner: No  . Emotionally Abused: No  . Physically Abused: No  . Sexually Abused: No      Review of systems: All other review of systems negative except as mentioned in the HPI.   Physical Exam: Vitals:   04/16/20 1035  BP: (!) 142/86  Pulse: 77   Body mass index is 28.56 kg/m. Gen:      No acute distress HEENT:  sclera anicteric Abd:      soft, non-tender; no palpable masses, no distension Ext:    No edema Neuro: alert and oriented x 3 Psych: normal mood and affect  Data Reviewed:  Reviewed labs, radiology imaging, old records and pertinent past GI work up   Assessment and Plan/Recommendations:  80 year old very pleasant female with history of chronic GERD and chronic cough Worsening reflux symptoms, cough is worse when she tries to drink or eat something. We will plan for the FEES study to exclude oropharyngeal dysphagia or aspiration  Increase Nexium to twice daily and continue antireflux measures  Return after fees study to discuss further evaluation and work-up as needed   The patient was provided an opportunity to ask questions and all were answered. The patient agreed with the plan and demonstrated an understanding of the instructions.  Iona Beard , MD    CC: No ref. provider found

## 2020-04-16 NOTE — Patient Instructions (Signed)
You will receive a call from a scheduler to schedule your FEES Study ( Fiberoptic Endoscopic Evaluating swallowing study)   Increase Nexium to twice daily   Conn's Current Therapy 2021 (pp. 213-216). [place], [place]: Elsevier.">  Gastroesophageal Reflux Disease, Adult Gastroesophageal reflux (GER) happens when acid from the stomach flows up into the tube that connects the mouth and the stomach (esophagus). Normally, food travels down the esophagus and stays in the stomach to be digested. However, when a person has GER, food and stomach acid sometimes move back up into the esophagus. If this becomes a more serious problem, the person may be diagnosed with a disease called gastroesophageal reflux disease (GERD). GERD occurs when the reflux:  Happens often.  Causes frequent or severe symptoms.  Causes problems such as damage to the esophagus. When stomach acid comes in contact with the esophagus, the acid may cause inflammation in the esophagus. Over time, GERD may create small holes (ulcers) in the lining of the esophagus. What are the causes? This condition is caused by a problem with the muscle between the esophagus and the stomach (lower esophageal sphincter, or LES). Normally, the LES muscle closes after food passes through the esophagus to the stomach. When the LES is weakened or abnormal, it does not close properly, and that allows food and stomach acid to go back up into the esophagus. The LES can be weakened by certain dietary substances, medicines, and medical conditions, including:  Tobacco use.  Pregnancy.  Having a hiatal hernia.  Alcohol use.  Certain foods and beverages, such as coffee, chocolate, onions, and peppermint. What increases the risk? You are more likely to develop this condition if you:  Have an increased body weight.  Have a connective tissue disorder.  Take NSAIDs, such as ibuprofen. What are the signs or symptoms? Symptoms of this condition  include:  Heartburn.  Difficult or painful swallowing and the feeling of having a lump in the throat.  A bitter taste in the mouth.  Bad breath and having a large amount of saliva.  Having an upset or bloated stomach and belching.  Chest pain. Different conditions can cause chest pain. Make sure you see your health care provider if you experience chest pain.  Shortness of breath or wheezing.  Ongoing (chronic) cough or a nighttime cough.  Wearing away of tooth enamel.  Weight loss. How is this diagnosed? This condition may be diagnosed based on a medical history and a physical exam. To determine if you have mild or severe GERD, your health care provider may also monitor how you respond to treatment. You may also have tests, including:  A test to examine your stomach and esophagus with a small camera (endoscopy).  A test that measures the acidity level in your esophagus.  A test that measures how much pressure is on your esophagus.  A barium swallow or modified barium swallow test to show the shape, size, and functioning of your esophagus. How is this treated? Treatment for this condition may vary depending on how severe your symptoms are. Your health care provider may recommend:  Changes to your diet.  Medicine.  Surgery. The goal of treatment is to help relieve your symptoms and to prevent complications. Follow these instructions at home: Eating and drinking  Follow a diet as recommended by your health care provider. This may involve avoiding foods and drinks such as: ? Coffee and tea, with or without caffeine. ? Drinks that contain alcohol. ? Energy drinks and sports drinks. ?  Carbonated drinks or sodas. ? Chocolate and cocoa. ? Peppermint and mint flavorings. ? Garlic and onions. ? Horseradish. ? Spicy and acidic foods, including peppers, chili powder, curry powder, vinegar, hot sauces, and barbecue sauce. ? Citrus fruit juices and citrus fruits, such as  oranges, lemons, and limes. ? Tomato-based foods, such as red sauce, chili, salsa, and pizza with red sauce. ? Fried and fatty foods, such as donuts, french fries, potato chips, and high-fat dressings. ? High-fat meats, such as hot dogs and fatty cuts of red and white meats, such as rib eye steak, sausage, ham, and bacon. ? High-fat dairy items, such as whole milk, butter, and cream cheese.  Eat small, frequent meals instead of large meals.  Avoid drinking large amounts of liquid with your meals.  Avoid eating meals during the 2-3 hours before bedtime.  Avoid lying down right after you eat.  Do not exercise right after you eat.   Lifestyle  Do not use any products that contain nicotine or tobacco. These products include cigarettes, chewing tobacco, and vaping devices, such as e-cigarettes. If you need help quitting, ask your health care provider.  Try to reduce your stress by using methods such as yoga or meditation. If you need help reducing stress, ask your health care provider.  If you are overweight, reduce your weight to an amount that is healthy for you. Ask your health care provider for guidance about a safe weight loss goal.   General instructions  Pay attention to any changes in your symptoms.  Take over-the-counter and prescription medicines only as told by your health care provider. Do not take aspirin, ibuprofen, or other NSAIDs unless your health care provider told you to take these medicines.  Wear loose-fitting clothing. Do not wear anything tight around your waist that causes pressure on your abdomen.  Raise (elevate) the head of your bed about 6 inches (15 cm). You can use a wedge to do this.  Avoid bending over if this makes your symptoms worse.  Keep all follow-up visits. This is important. Contact a health care provider if:  You have: ? New symptoms. ? Unexplained weight loss. ? Difficulty swallowing or it hurts to swallow. ? Wheezing or a persistent  cough. ? A hoarse voice.  Your symptoms do not improve with treatment. Get help right away if:  You have sudden pain in your arms, neck, jaw, teeth, or back.  You suddenly feel sweaty, dizzy, or light-headed.  You have chest pain or shortness of breath.  You vomit and the vomit is green, yellow, or black, or it looks like blood or coffee grounds.  You faint.  You have stool that is red, bloody, or black.  You cannot swallow, drink, or eat. These symptoms may represent a serious problem that is an emergency. Do not wait to see if the symptoms will go away. Get medical help right away. Call your local emergency services (911 in the [place] Do not drive yourself to the hospital. Summary  Gastroesophageal reflux happens when acid from the stomach flows up into the esophagus. GERD is a disease in which the reflux happens often, causes frequent or severe symptoms, or causes problems such as damage to the esophagus.  Treatment for this condition may vary depending on how severe your symptoms are. Your health care provider may recommend diet and lifestyle changes, medicine, or surgery.  Contact a health care provider if you have new or worsening symptoms.  Take over-the-counter and prescription medicines only as  told by your health care provider. Do not take aspirin, ibuprofen, or other NSAIDs unless your health care provider told you to do so.  Keep all follow-up visits as told by your health care provider. This is important. This information is not intended to replace advice given to you by your health care provider. Make sure you discuss any questions you have with your health care provider. Document Revised: 08/20/2019 Document Reviewed: 08/20/2019 Elsevier Patient Education  2021 [employer].  Due to recent changes in healthcare laws, you may see the results of your imaging and laboratory studies on MyChart before your provider has had a chance to review them.  We understand that in  some cases there may be results that are confusing or concerning to you. Not all laboratory results come back in the same time frame and the provider may be waiting for multiple results in order to interpret others.  Please give [place] 48 hours in order for your provider to thoroughly review all the results before contacting the office for clarification of your results.   I appreciate the  opportunity to care for you  Thank You   Marsa Aris , MD

## 2020-04-23 ENCOUNTER — Telehealth: Payer: Self-pay

## 2020-04-23 DIAGNOSIS — E039 Hypothyroidism, unspecified: Secondary | ICD-10-CM

## 2020-04-23 MED ORDER — LEVOTHYROXINE SODIUM 100 MCG PO TABS: 100.0000 ug | ORAL_TABLET | Freq: Every day | ORAL | 1 refills | Status: DC

## 2020-04-23 NOTE — Telephone Encounter (Signed)
Pharmacy requests refill on: Levothyroxine 100 mcg   LAST REFILL: 01/28/2020 (Q-30, R-2) LAST OV: 03/19/2020 NEXT OV: 06/17/2020 PHARMACY: [id number] [place], [place]  TSH (03/17/2020): 6.03

## 2020-05-02 ENCOUNTER — Other Ambulatory Visit: Payer: Self-pay | Admitting: Interventional Cardiology

## 2020-05-06 ENCOUNTER — Encounter: Payer: Self-pay | Admitting: Gastroenterology

## 2020-05-23 ENCOUNTER — Other Ambulatory Visit: Payer: Self-pay | Admitting: Interventional Cardiology

## 2020-05-28 DIAGNOSIS — M9906 Segmental and somatic dysfunction of lower extremity: Secondary | ICD-10-CM | POA: Diagnosis not present

## 2020-05-28 DIAGNOSIS — M9903 Segmental and somatic dysfunction of lumbar region: Secondary | ICD-10-CM | POA: Diagnosis not present

## 2020-05-28 DIAGNOSIS — M5442 Lumbago with sciatica, left side: Secondary | ICD-10-CM | POA: Diagnosis not present

## 2020-05-28 DIAGNOSIS — S83412A Sprain of medial collateral ligament of left knee, initial encounter: Secondary | ICD-10-CM | POA: Diagnosis not present

## 2020-05-28 DIAGNOSIS — S83411A Sprain of medial collateral ligament of right knee, initial encounter: Secondary | ICD-10-CM | POA: Diagnosis not present

## 2020-05-30 ENCOUNTER — Other Ambulatory Visit: Payer: Self-pay | Admitting: Gastroenterology

## 2020-06-03 DIAGNOSIS — M5442 Lumbago with sciatica, left side: Secondary | ICD-10-CM | POA: Diagnosis not present

## 2020-06-03 DIAGNOSIS — M9903 Segmental and somatic dysfunction of lumbar region: Secondary | ICD-10-CM | POA: Diagnosis not present

## 2020-06-03 DIAGNOSIS — M9906 Segmental and somatic dysfunction of lower extremity: Secondary | ICD-10-CM | POA: Diagnosis not present

## 2020-06-03 DIAGNOSIS — S83412A Sprain of medial collateral ligament of left knee, initial encounter: Secondary | ICD-10-CM | POA: Diagnosis not present

## 2020-06-03 DIAGNOSIS — S83411A Sprain of medial collateral ligament of right knee, initial encounter: Secondary | ICD-10-CM | POA: Diagnosis not present

## 2020-06-04 DIAGNOSIS — N8111 Cystocele, midline: Secondary | ICD-10-CM | POA: Diagnosis not present

## 2020-06-04 DIAGNOSIS — N1339 Other hydronephrosis: Secondary | ICD-10-CM | POA: Diagnosis not present

## 2020-06-04 DIAGNOSIS — N1330 Unspecified hydronephrosis: Secondary | ICD-10-CM | POA: Diagnosis not present

## 2020-06-10 DIAGNOSIS — M9906 Segmental and somatic dysfunction of lower extremity: Secondary | ICD-10-CM | POA: Diagnosis not present

## 2020-06-10 DIAGNOSIS — S83411A Sprain of medial collateral ligament of right knee, initial encounter: Secondary | ICD-10-CM | POA: Diagnosis not present

## 2020-06-10 DIAGNOSIS — S83412A Sprain of medial collateral ligament of left knee, initial encounter: Secondary | ICD-10-CM | POA: Diagnosis not present

## 2020-06-10 DIAGNOSIS — M5442 Lumbago with sciatica, left side: Secondary | ICD-10-CM | POA: Diagnosis not present

## 2020-06-10 DIAGNOSIS — M9903 Segmental and somatic dysfunction of lumbar region: Secondary | ICD-10-CM | POA: Diagnosis not present

## 2020-06-13 ENCOUNTER — Telehealth: Payer: Self-pay

## 2020-06-13 MED ORDER — METFORMIN HCL 500 MG PO TABS: 1.0000 | ORAL_TABLET | Freq: Two times a day (BID) | ORAL | 1 refills | Status: DC

## 2020-06-13 NOTE — Telephone Encounter (Signed)
Pharmacy requests refill on: Metformin 500 mg   LAST REFILL: 07/24/2019 (Q-180, R-1) LAST OV: 03/19/2020 NEXT OV: 06/17/2020 PHARMACY: [id number] [place], [place]  Hgb A1C (03/17/2020): 7.2

## 2020-06-17 ENCOUNTER — Other Ambulatory Visit: Payer: Medicare PPO

## 2020-06-17 ENCOUNTER — Other Ambulatory Visit: Payer: Self-pay

## 2020-06-17 ENCOUNTER — Ambulatory Visit: Payer: Medicare PPO | Admitting: Family Medicine

## 2020-06-17 DIAGNOSIS — S83411A Sprain of medial collateral ligament of right knee, initial encounter: Secondary | ICD-10-CM | POA: Diagnosis not present

## 2020-06-17 DIAGNOSIS — M5442 Lumbago with sciatica, left side: Secondary | ICD-10-CM | POA: Diagnosis not present

## 2020-06-17 DIAGNOSIS — E119 Type 2 diabetes mellitus without complications: Secondary | ICD-10-CM | POA: Diagnosis not present

## 2020-06-17 DIAGNOSIS — M9903 Segmental and somatic dysfunction of lumbar region: Secondary | ICD-10-CM | POA: Diagnosis not present

## 2020-06-17 DIAGNOSIS — M9906 Segmental and somatic dysfunction of lower extremity: Secondary | ICD-10-CM | POA: Diagnosis not present

## 2020-06-17 DIAGNOSIS — S83412A Sprain of medial collateral ligament of left knee, initial encounter: Secondary | ICD-10-CM | POA: Diagnosis not present

## 2020-06-17 DIAGNOSIS — E871 Hypo-osmolality and hyponatremia: Secondary | ICD-10-CM

## 2020-06-17 LAB — TSH: TSH: 3.65 u[IU]/mL (ref 0.35–4.50)

## 2020-06-17 LAB — HEMOGLOBIN A1C: Hgb A1c MFr Bld: 7.4 % — ABNORMAL HIGH (ref 4.6–6.5)

## 2020-06-18 ENCOUNTER — Telehealth: Payer: Self-pay | Admitting: *Deleted

## 2020-06-18 NOTE — Telephone Encounter (Signed)
Left VM requesting pt to call the office back 

## 2020-06-18 NOTE — Telephone Encounter (Signed)
-----   Message from Judy Pimple, MD sent at 06/17/2020  8:43 PM EDT ----- TSH is in the normal range- stay on current levothyroxine dose A1c is up again to 7.4  I would like to inc her metformin dose to 1000 mg bid if agreeable and then f/u 3 mo  Please try and stick to low glycemic diet

## 2020-06-24 DIAGNOSIS — S83412A Sprain of medial collateral ligament of left knee, initial encounter: Secondary | ICD-10-CM | POA: Diagnosis not present

## 2020-06-24 DIAGNOSIS — M9906 Segmental and somatic dysfunction of lower extremity: Secondary | ICD-10-CM | POA: Diagnosis not present

## 2020-06-24 DIAGNOSIS — S83411A Sprain of medial collateral ligament of right knee, initial encounter: Secondary | ICD-10-CM | POA: Diagnosis not present

## 2020-06-24 DIAGNOSIS — M5442 Lumbago with sciatica, left side: Secondary | ICD-10-CM | POA: Diagnosis not present

## 2020-06-24 DIAGNOSIS — M9903 Segmental and somatic dysfunction of lumbar region: Secondary | ICD-10-CM | POA: Diagnosis not present

## 2020-07-04 ENCOUNTER — Ambulatory Visit: Payer: Medicare PPO | Admitting: Gastroenterology

## 2020-07-29 ENCOUNTER — Other Ambulatory Visit: Payer: Self-pay | Admitting: Interventional Cardiology

## 2020-08-01 ENCOUNTER — Ambulatory Visit: Payer: Medicare PPO | Admitting: Interventional Cardiology

## 2020-08-05 ENCOUNTER — Other Ambulatory Visit: Payer: Self-pay | Admitting: Internal Medicine

## 2020-08-22 ENCOUNTER — Other Ambulatory Visit: Payer: Self-pay | Admitting: Interventional Cardiology

## 2020-09-12 MED ORDER — PREDNISONE 5 MG PO TABS: 5.0000 mg | ORAL_TABLET | Freq: Every day | ORAL | 0 refills | Status: DC

## 2020-09-12 NOTE — Telephone Encounter (Signed)
Order- Try prednisone 15 mg daily x 7 days (5 mg, # 21). If this doesn't help we will need to see her.

## 2020-09-12 NOTE — Telephone Encounter (Signed)
Dr. Maple Hudson, I am having a lot of tightness in the area of the bronchia and some hoarseness at times. I am still taking the meds as you directed. I also use a saline spray in one nostril at bedtime. Is there anything else that I can do to help loosen the tightness. At times I all but lose my voice.   Thanks for your help, Jordan Patterson

## 2020-09-17 ENCOUNTER — Other Ambulatory Visit: Payer: Self-pay | Admitting: Interventional Cardiology

## 2020-09-30 ENCOUNTER — Telehealth: Payer: Self-pay | Admitting: Internal Medicine

## 2020-09-30 NOTE — Telephone Encounter (Signed)
ATC x1, no answer, left vm to return call to office.

## 2020-09-30 NOTE — Progress Notes (Signed)
Cardiology Office Note:    Date:  09/30/2020   ID:  Jordan Patterson, DOB [birth year], MRN [id number]  PCP:  Judy Pimple, MD  Cardiologist:  Lesleigh Noe, MD   Referring MD: No ref. provider found   No chief complaint on file.   History of Present Illness:    Jordan Patterson is a 80 y.o. female with a hx of  hypertension, hyperlipidemia, and DM II.   The patient is here today unaccompanied by her daughter.  She tells me that she walks 2 miles most mornings.  It takes about 45 minutes.  She has no leg discomfort, shortness of breath, or chest pain.  They expressly concerned about the possibility of developing heart failure.  The major concern is that occasionally when she swallows she begins coughing.  This seems to be progressively getting worse.  She denies orthopnea and PND.  She has not had syncope or prolonged racing of the heart.  Past Medical History:  Diagnosis Date   Allergic rhinitis    Arthritis    hands and knees - ?osteo   Asthma    Chronic headaches    Compression fracture of T12 vertebra (HCC) 07/18/2014   S/p kyphoplasty by Dr Newell Coral (08/2014)    Depression    pt denies   Diabetes type 2, controlled (HCC) 2004   Environmental allergies    dust,mold,mildew   Extrinsic asthma    GERD (gastroesophageal reflux disease)    [name] EGD - mild esophageal dysmotility and small hiatal hernia   Hiatal hernia    Hx of migraines    Hyperlipidemia    mild, diet controlled   Hypertension    Hypothyroidism    Macular degeneration    Osteoporosis with fracture 07/2014   T -1.2 hip, T 0.2 spine, T12 compression fracture s/p kyphoplasty    Past Surgical History:  Procedure Laterality Date   27 HOUR PH STUDY N/A 04/11/2017   Procedure: 24 HOUR PH STUDY;  Surgeon: Napoleon Form, MD;  Location: [facility];  Service: Endoscopy;  Laterality: N/A;   CATARACT EXTRACTION  2003   bilateral with lens implant   COLONOSCOPY     DEXA  05/2008   improved (initial DEXA  2007 - mild osteopenia)   DILATION AND CURETTAGE OF UTERUS  1978   ESOPHAGEAL MANOMETRY N/A 04/11/2017   Procedure: ESOPHAGEAL MANOMETRY (EM);  Surgeon: Napoleon Form, MD;  Location: [facility];  Service: Endoscopy;  Laterality: N/A;   ESOPHAGOGASTRODUODENOSCOPY  06/25/2011   Procedure: ESOPHAGOGASTRODUODENOSCOPY (EGD);  Surgeon: Hart Carwin, MD;  Location: [facility];  Service: Endoscopy;  Laterality: N/A;  no Xray   KYPHOPLASTY N/A 08/30/2014   Procedure: THORACIC TWELVE KYPHOPLASTY;  Surgeon: [name], MD   LAPAROSCOPY ABDOMEN DIAGNOSTIC     To R/O endometriosis   PH IMPEDANCE STUDY N/A 04/11/2017   Procedure: PH IMPEDANCE STUDY;  Surgeon: Napoleon Form, MD;  Location: [facility];  Service: Endoscopy;  Laterality: N/A;   SAVORY DILATION  06/25/2011   Procedure: SAVORY DILATION;  Surgeon: Hart Carwin, MD;  Location: [facility];  Service: Endoscopy;  Laterality: N/A;   TONSILLECTOMY AND ADENOIDECTOMY  1962   US ECHOCARDIOGRAPHY  10/2014   nl systolic function EF 55%, mild diastolic dysfunction    Current Medications: No outpatient medications have been marked as taking for the 10/01/20 encounter (Appointment) with Lyn Records, MD.     Allergies:   Ace inhibitors, Amlodipine, Crestor [rosuvastatin], Pregabalin, Tegretol [  carbamazepine], Wellbutrin [bupropion hcl], Cymbalta [duloxetine hcl], and Lipitor [atorvastatin]   Social History   Socioeconomic History   Marital status: Widowed    Spouse name: Not on file   Number of children: 3   Years of education: Not on file   Highest education level: Not on file  Occupational History   Occupation: Retired  Tobacco Use   Smoking status: Never   Smokeless tobacco: Never  Vaping Use   Vaping Use: Never used  Substance and Sexual Activity   Alcohol use: No    Alcohol/week: 0.0 standard drinks   Drug use: No   Sexual activity: Never  Other Topics Concern   Not on file  Social History Narrative   Caffeine:  occasional coffee   Lives alone, widower, 1 cat   Occupation: retired, 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   Act: works 3d/wk Chemical engineer), lives in [place], gardens, walks   Diet: good water, fruits/vegetables daily      HCPOA is Abree Romick [place] [phone] (cell)   Social Determinants of Health   Financial Resource Strain: Low Risk    Difficulty of Paying Living Expenses: Not hard at all  Food Insecurity: No Food Insecurity   Worried About Programme researcher, broadcasting/film/video in the Last Year: Never true   Ran Out of Food in the Last Year: Never true  Transportation Needs: No Transportation Needs   Lack of Transportation (Medical): No   Lack of Transportation (Non-Medical): No  Physical Activity: Inactive   Days of Exercise per Week: 0 days   Minutes of Exercise per Session: 0 min  Stress: No Stress Concern Present   Feeling of Stress : Not at all  Social Connections: Not on file     Family History: The patient's family history includes Diabetes in her father; Heart failure in her maternal grandfather; Hypertension in her mother; Mitral valve prolapse in her father and mother; Other in her brother; Parkinson's disease in her brother; Stroke in her paternal grandfather; Stroke (age of onset: 102) in her mother. There is no history of Cancer, Colon cancer, Stomach cancer, Esophageal cancer, Pancreatic cancer, or Liver disease.  ROS:   Please see the history of present illness.    Cough has been consistently present over several years.  She has occasional asthmatic episodes.  She gets reflux when she lies flat at night.  She has occasional phlegm in her throat.  She does not have dysphagia.  She gets numbness and tingling in her feet and hands.  All other systems reviewed and are negative.  EKGs/Labs/Other Studies Reviewed:    The following studies were reviewed today: 2D Doppler echocardiogram 11/22/2014:  Study Conclusions   - Left ventricle: The cavity size was normal. There was mild  focal    basal and mild concentric hypertrophy of the septum. Systolic    function was normal. The estimated ejection fraction was 55%.    Wall motion was normal; there were no regional wall motion    abnormalities. Doppler parameters are consistent with abnormal    left ventricular relaxation (grade 1 diastolic dysfunction).    Doppler parameters are consistent with elevated ventricular    end-diastolic filling pressure.  - Aortic valve: There was trivial regurgitation.  - Aorta: Root not well seen bright reverbatration artifact seen in    PLA veiw.  - Atrial septum: No defect or patent foramen ovale was identified.   EKG:  EKG normal sinus rhythm, decreased voltage, poor R wave  progression V1 through V4.  When compared to May 2021 no changes noted.  Recent Labs: 03/17/2020: ALT 25; BUN 15; Creatinine, Ser 0.82; Hemoglobin 12.6; Platelets 205.0; Potassium 4.7; Sodium 135 06/17/2020: TSH 3.65  Recent Lipid Panel    Component Value Date/Time   CHOL 250 (H) 03/17/2020 0831   CHOL 297 (H) 02/27/2015 1525   TRIG 230.0 (H) 03/17/2020 0831   TRIG 172 (H) 02/27/2015 1525   TRIG 129 07/30/2010 0000   HDL 53.10 03/17/2020 0831   HDL 59 02/27/2015 1525   CHOLHDL 5 03/17/2020 0831   VLDL 46.0 (H) 03/17/2020 0831   LDLCALC 156 (H) 03/05/2019 0820   LDLCALC 204 (H) 02/27/2015 1525   LDLDIRECT 164.0 03/17/2020 0831    Physical Exam:    VS:  There were no vitals taken for this visit.    Wt Readings from Last 3 Encounters:  04/16/20 184 lb (83.5 kg)  03/19/20 182 lb (82.6 kg)  11/22/19 179 lb 9.6 oz (81.5 kg)     GEN: Looks younger than stated age. No acute distress HEENT: Normal NECK: No JVD. LYMPHATICS: No lymphadenopathy CARDIAC: No murmur. RRR no gallop, or edema. VASCULAR:  Normal Pulses. No bruits. RESPIRATORY:  Clear to auscultation without rales, wheezing or rhonchi  ABDOMEN: Soft, non-tender, non-distended, No pulsatile mass, MUSCULOSKELETAL: No deformity  SKIN: Warm and  dry NEUROLOGIC:  Alert and oriented x 3 PSYCHIATRIC:  Normal affect   ASSESSMENT:    1. Essential hypertension   2. Hyperlipidemia, unspecified hyperlipidemia type   3. Controlled type 2 diabetes mellitus without complication, without long-term current use of insulin (HCC)   4. Hiatal hernia    PLAN:    In order of problems listed above:  New blood pressure target 140/80.  Increase HCTZ/chlorthalidone to 12.5 mg daily.  Basic metabolic panel in 2 weeks.  Monitor blood pressure and call results after 2 weeks.She has had cough on ACE inhibitor's.  She wonders if she has having cough on amlodipine and or Diovan. Continue low-fat diet.  Lipids are terrible but the patient has been unwilling to take statins because of musculoskeletal discomfort.   Hemoglobin A1c is 7.4 in April.  She is on Glucophage. Reflux related symptoms and prior history of esophageal stricture with dilatation.    I do not believe we need imaging.  I encouraged continued physical activity.  I encouraged decreasing salt intake.  We have decided to intensify the dose of diuretic therapy.  We discussed with the patient that numbness in hands and feet not related to her blood pressure medication.  Medication Adjustments/Labs and Tests Ordered: Current medicines are reviewed at length with the patient today.  Concerns regarding medicines are outlined above.  No orders of the defined types were placed in this encounter.  No orders of the defined types were placed in this encounter.   There are no Patient Instructions on file for this visit.   Signed, Lesleigh Noe, MD  09/30/2020 9:24 PM    [facility]

## 2020-10-01 ENCOUNTER — Ambulatory Visit: Payer: Medicare PPO | Admitting: Interventional Cardiology

## 2020-10-01 ENCOUNTER — Other Ambulatory Visit: Payer: Self-pay

## 2020-10-01 ENCOUNTER — Encounter: Payer: Self-pay | Admitting: Interventional Cardiology

## 2020-10-01 VITALS — BP 150/92 | HR 80 | Ht 67.3 in | Wt 173.4 lb

## 2020-10-01 DIAGNOSIS — E119 Type 2 diabetes mellitus without complications: Secondary | ICD-10-CM

## 2020-10-01 DIAGNOSIS — E785 Hyperlipidemia, unspecified: Secondary | ICD-10-CM | POA: Diagnosis not present

## 2020-10-01 DIAGNOSIS — I10 Essential (primary) hypertension: Secondary | ICD-10-CM | POA: Diagnosis not present

## 2020-10-01 DIAGNOSIS — K449 Diaphragmatic hernia without obstruction or gangrene: Secondary | ICD-10-CM | POA: Diagnosis not present

## 2020-10-01 MED ORDER — CHLORTHALIDONE 25 MG PO TABS: 12.5000 mg | ORAL_TABLET | Freq: Every day | ORAL | 3 refills | Status: DC

## 2020-10-01 NOTE — Telephone Encounter (Signed)
ATC home phone # x2, no answer, no vm.  ATC cell # x1, left VM to return call.

## 2020-10-01 NOTE — Patient Instructions (Signed)
Medication Instructions:  1) START Chlorthalidone 12.5mg  once daily.  Monitor your blood pressure 2-3 times per week and call the office with those readings in  2 weeks.  Wait at least two hours after your medication before taking your blood pressure.  *If you need a refill on your cardiac medications before your next appointment, please call your pharmacy*   Lab Work: BMET in 2 weeks  If you have labs (blood work) drawn today and your tests are completely normal, you will receive your results only by: MyChart Message (if you have MyChart) OR A paper copy in the mail If you have any lab test that is abnormal or we need to change your treatment, we will call you to review the results.   Testing/Procedures: None   Follow-Up: At [facility], you and your health needs are our priority.  As part of our continuing mission to provide you with exceptional heart care, we have created designated Provider Care Teams.  These Care Teams include your primary Cardiologist (physician) and Advanced Practice Providers (APPs -  Physician Assistants and Nurse Practitioners) who all work together to provide you with the care you need, when you need it.  We recommend signing up for the patient portal called "MyChart".  Sign up information is provided on this After Visit Summary.  MyChart is used to connect with patients for Virtual Visits (Telemedicine).  Patients are able to view lab/test results, encounter notes, upcoming appointments, etc.  Non-urgent messages can be sent to your provider as well.   To learn more about what you can do with MyChart, go to [email or web address].    Your next appointment:   1 year(s)  The format for your next appointment:   In Person  Provider:   You may see Lesleigh Noe, MD or one of the following Advanced Practice Providers on your designated Care Team:   Nada Boozer, NP   Other Instructions

## 2020-10-02 NOTE — Telephone Encounter (Signed)
I have called and LM on VM for the pt.  This is the 3rd time that we have called the pt with no answer.  Per protocol I will sign off this message.  I did leave a VM asking the pt to call back if she is still needing to speak with [place] about this message.

## 2020-10-14 ENCOUNTER — Other Ambulatory Visit: Payer: Medicare PPO

## 2020-10-15 ENCOUNTER — Other Ambulatory Visit: Payer: Self-pay | Admitting: Interventional Cardiology

## 2020-10-16 ENCOUNTER — Other Ambulatory Visit: Payer: Self-pay

## 2020-10-16 ENCOUNTER — Other Ambulatory Visit: Payer: Medicare PPO | Admitting: *Deleted

## 2020-10-16 DIAGNOSIS — I10 Essential (primary) hypertension: Secondary | ICD-10-CM | POA: Diagnosis not present

## 2020-10-17 LAB — BASIC METABOLIC PANEL
BUN/Creatinine Ratio: 18 (ref 12–28)
BUN: 14 mg/dL (ref 8–27)
CO2: 25 mmol/L (ref 20–29)
Calcium: 9.5 mg/dL (ref 8.7–10.3)
Chloride: 91 mmol/L — ABNORMAL LOW (ref 96–106)
Creatinine, Ser: 0.78 mg/dL (ref 0.57–1.00)
Glucose: 109 mg/dL — ABNORMAL HIGH (ref 65–99)
Potassium: 3.9 mmol/L (ref 3.5–5.2)
Sodium: 135 mmol/L (ref 134–144)
eGFR: 77 mL/min/{1.73_m2} (ref >59)

## 2020-10-25 ENCOUNTER — Other Ambulatory Visit: Payer: Self-pay | Admitting: Family Medicine

## 2020-10-25 DIAGNOSIS — E039 Hypothyroidism, unspecified: Secondary | ICD-10-CM

## 2020-10-31 ENCOUNTER — Other Ambulatory Visit: Payer: Self-pay | Admitting: Interventional Cardiology

## 2020-11-04 DIAGNOSIS — M9902 Segmental and somatic dysfunction of thoracic region: Secondary | ICD-10-CM | POA: Diagnosis not present

## 2020-11-04 DIAGNOSIS — S233XXA Sprain of ligaments of thoracic spine, initial encounter: Secondary | ICD-10-CM | POA: Diagnosis not present

## 2020-11-05 DIAGNOSIS — M9902 Segmental and somatic dysfunction of thoracic region: Secondary | ICD-10-CM | POA: Diagnosis not present

## 2020-11-05 DIAGNOSIS — S233XXA Sprain of ligaments of thoracic spine, initial encounter: Secondary | ICD-10-CM | POA: Diagnosis not present

## 2020-11-10 DIAGNOSIS — S233XXA Sprain of ligaments of thoracic spine, initial encounter: Secondary | ICD-10-CM | POA: Diagnosis not present

## 2020-11-10 DIAGNOSIS — M9902 Segmental and somatic dysfunction of thoracic region: Secondary | ICD-10-CM | POA: Diagnosis not present

## 2020-11-12 DIAGNOSIS — S233XXA Sprain of ligaments of thoracic spine, initial encounter: Secondary | ICD-10-CM | POA: Diagnosis not present

## 2020-11-12 DIAGNOSIS — M9902 Segmental and somatic dysfunction of thoracic region: Secondary | ICD-10-CM | POA: Diagnosis not present

## 2020-11-17 DIAGNOSIS — M9902 Segmental and somatic dysfunction of thoracic region: Secondary | ICD-10-CM | POA: Diagnosis not present

## 2020-11-17 DIAGNOSIS — S233XXA Sprain of ligaments of thoracic spine, initial encounter: Secondary | ICD-10-CM | POA: Diagnosis not present

## 2020-11-19 DIAGNOSIS — M9902 Segmental and somatic dysfunction of thoracic region: Secondary | ICD-10-CM | POA: Diagnosis not present

## 2020-11-19 DIAGNOSIS — S233XXA Sprain of ligaments of thoracic spine, initial encounter: Secondary | ICD-10-CM | POA: Diagnosis not present

## 2020-11-20 DIAGNOSIS — I10 Essential (primary) hypertension: Secondary | ICD-10-CM

## 2020-11-24 DIAGNOSIS — M9902 Segmental and somatic dysfunction of thoracic region: Secondary | ICD-10-CM | POA: Diagnosis not present

## 2020-11-24 DIAGNOSIS — S233XXA Sprain of ligaments of thoracic spine, initial encounter: Secondary | ICD-10-CM | POA: Diagnosis not present

## 2020-11-26 DIAGNOSIS — M9902 Segmental and somatic dysfunction of thoracic region: Secondary | ICD-10-CM | POA: Diagnosis not present

## 2020-11-26 DIAGNOSIS — S233XXA Sprain of ligaments of thoracic spine, initial encounter: Secondary | ICD-10-CM | POA: Diagnosis not present

## 2020-11-27 MED ORDER — CHLORTHALIDONE 25 MG PO TABS: 25.0000 mg | ORAL_TABLET | Freq: Every day | ORAL | 1 refills | Status: DC

## 2020-11-27 NOTE — Addendum Note (Signed)
Addended by: Julio Sicks on: 11/27/2020 01:08 PM   Modules accepted: Orders

## 2020-11-27 NOTE — Telephone Encounter (Signed)
Okay to refill. She needs a BMP

## 2020-12-01 DIAGNOSIS — M9902 Segmental and somatic dysfunction of thoracic region: Secondary | ICD-10-CM | POA: Diagnosis not present

## 2020-12-01 DIAGNOSIS — S233XXA Sprain of ligaments of thoracic spine, initial encounter: Secondary | ICD-10-CM | POA: Diagnosis not present

## 2020-12-03 DIAGNOSIS — S233XXA Sprain of ligaments of thoracic spine, initial encounter: Secondary | ICD-10-CM | POA: Diagnosis not present

## 2020-12-03 DIAGNOSIS — M9902 Segmental and somatic dysfunction of thoracic region: Secondary | ICD-10-CM | POA: Diagnosis not present

## 2020-12-08 DIAGNOSIS — S233XXA Sprain of ligaments of thoracic spine, initial encounter: Secondary | ICD-10-CM | POA: Diagnosis not present

## 2020-12-08 DIAGNOSIS — M9902 Segmental and somatic dysfunction of thoracic region: Secondary | ICD-10-CM | POA: Diagnosis not present

## 2020-12-10 ENCOUNTER — Other Ambulatory Visit: Payer: Medicare PPO | Admitting: *Deleted

## 2020-12-10 ENCOUNTER — Other Ambulatory Visit: Payer: Self-pay

## 2020-12-10 DIAGNOSIS — I10 Essential (primary) hypertension: Secondary | ICD-10-CM

## 2020-12-10 DIAGNOSIS — M9902 Segmental and somatic dysfunction of thoracic region: Secondary | ICD-10-CM | POA: Diagnosis not present

## 2020-12-10 DIAGNOSIS — S233XXA Sprain of ligaments of thoracic spine, initial encounter: Secondary | ICD-10-CM | POA: Diagnosis not present

## 2020-12-11 LAB — BASIC METABOLIC PANEL
BUN/Creatinine Ratio: 15 (ref 12–28)
BUN: 12 mg/dL (ref 8–27)
CO2: 27 mmol/L (ref 20–29)
Calcium: 9.8 mg/dL (ref 8.7–10.3)
Chloride: 92 mmol/L — ABNORMAL LOW (ref 96–106)
Creatinine, Ser: 0.79 mg/dL (ref 0.57–1.00)
Glucose: 117 mg/dL — ABNORMAL HIGH (ref 70–99)
Potassium: 4.2 mmol/L (ref 3.5–5.2)
Sodium: 133 mmol/L — ABNORMAL LOW (ref 134–144)
eGFR: 76 mL/min/{1.73_m2} (ref >59)

## 2020-12-12 ENCOUNTER — Other Ambulatory Visit: Payer: Self-pay | Admitting: Family Medicine

## 2020-12-12 NOTE — Telephone Encounter (Signed)
Pt hasn't had an a1c checked since April. Pt does have an acute "back pain" appt on Monday 12/15/20, please advise

## 2020-12-12 NOTE — Telephone Encounter (Signed)
I refilled it once and can check a1c when she is here

## 2020-12-15 ENCOUNTER — Ambulatory Visit (INDEPENDENT_AMBULATORY_CARE_PROVIDER_SITE_OTHER): Payer: Medicare PPO

## 2020-12-15 ENCOUNTER — Ambulatory Visit: Payer: Medicare PPO | Admitting: Family Medicine

## 2020-12-15 ENCOUNTER — Encounter: Payer: Self-pay | Admitting: Family Medicine

## 2020-12-15 ENCOUNTER — Other Ambulatory Visit: Payer: Self-pay

## 2020-12-15 VITALS — BP 160/85 | HR 98 | Temp 98.2°F | Ht 67.3 in | Wt 171.0 lb

## 2020-12-15 DIAGNOSIS — S22080A Wedge compression fracture of T11-T12 vertebra, initial encounter for closed fracture: Secondary | ICD-10-CM | POA: Diagnosis not present

## 2020-12-15 DIAGNOSIS — Z23 Encounter for immunization: Secondary | ICD-10-CM | POA: Diagnosis not present

## 2020-12-15 DIAGNOSIS — Z8739 Personal history of other diseases of the musculoskeletal system and connective tissue: Secondary | ICD-10-CM | POA: Diagnosis not present

## 2020-12-15 DIAGNOSIS — M40204 Unspecified kyphosis, thoracic region: Secondary | ICD-10-CM | POA: Diagnosis not present

## 2020-12-15 DIAGNOSIS — M810 Age-related osteoporosis without current pathological fracture: Secondary | ICD-10-CM | POA: Diagnosis not present

## 2020-12-15 DIAGNOSIS — I10 Essential (primary) hypertension: Secondary | ICD-10-CM | POA: Diagnosis not present

## 2020-12-15 DIAGNOSIS — M9902 Segmental and somatic dysfunction of thoracic region: Secondary | ICD-10-CM | POA: Diagnosis not present

## 2020-12-15 DIAGNOSIS — S233XXA Sprain of ligaments of thoracic spine, initial encounter: Secondary | ICD-10-CM | POA: Diagnosis not present

## 2020-12-15 MED ORDER — METHOCARBAMOL 500 MG PO TABS: 500.0000 mg | ORAL_TABLET | Freq: Three times a day (TID) | ORAL | 1 refills | Status: DC | PRN

## 2020-12-15 NOTE — Progress Notes (Signed)
Subjective:    Patient ID: Jordan Patterson, female    DOB: 12/21/1940, 80 y.o.   MRN: [id number]  This visit occurred during the SARS-CoV-2 public health emergency.  Safety protocols were in place, including screening questions prior to the visit, additional usage of staff PPE, and extensive cleaning of exam room while observing appropriate contact time as indicated for disinfecting solutions.   HPI Pt presents for c/o of back pain   Wt Readings from Last 3 Encounters:  12/15/20 171 lb (77.6 kg)  10/01/20 173 lb 6.4 oz (78.7 kg)  04/16/20 184 lb (83.5 kg)   26.54 kg/m  [place] labor day weekend  Thought she had bruised her R side Rudie Meyer afterwards  [place] in yard/ tripped over a hole  Had appt with her chiropractor and goes to him on a regular basis  Did xrays - twice  Now things there is a hairline fracture in T11  Still very tender (can do a very careful manipulation)  Family notes increase in pain recently  Not healing like it hoped  Pain is across the mid back  More on the L today It moves  Being on her feet makes it worse  Also bending over  Also chronic cough worsens it  She is wearing back brace  Most of the pain is sharp  Lying still is not as bad  Pain does not travel  No numbness or weakness   Takes tylenol  400 mg advil -careful about it    Family is concerned about her fall  Wonders if she has cognitive issues  ? If she may have hit her head   H/o kyphoplasty in 2016 for compression fx of T12 vert  (Dr Newell Coral) ? If Dr Danielle Dess could help (her chiropractor could help her get in)  Also knows Dr Jeral Fruit  H/o OP  Declined further eval or tx  Past forteo tx for 2 y   Last lumbar xray 2016: EXAM: LUMBAR SPINE - COMPLETE 4+ VIEW   COMPARISON:  None.   FINDINGS: The lumbar vertebral bodies are preserved in height. Mild loss of height anteriorly of T12 is suspected. The lumbar pedicles and transverse processes are intact. There is mild multilevel disc  space narrowing. The observed portions of the sacrum are unremarkable. There is no spondylolisthesis. There is facet joint hypertrophy at L4-5 and at L5-S1.   IMPRESSION: There is mild anterior wedge compression of T12 with loss of height of approximately 10%. There is mild degenerative facet joint change and disc space narrowing at multiple lumbar levels.  MRI done at that time :  CLINICAL DATA:  Back pain and leg weakness. Compression fracture T12 in May 2016.   EXAM: MRI THORACIC SPINE WITHOUT CONTRAST   TECHNIQUE: Multiplanar, multisequence MR imaging of the thoracic spine was performed. No intravenous contrast was administered.   COMPARISON:  Radiographs dated 07/08/2014   FINDINGS: There is a subacute compression fracture of the superior aspect of T12. There is only 2 mm of protrusion of the posterior superior aspect of T12 into the spinal canal with no neural impingement. There is no disc protrusion at that level.   There is a small central subligamentous disc protrusion at T5-6 which does deform the ventral aspect of the thoracic spinal cord but there is no myelopathy.   There is a small asymmetric disc bulge at T9-10 slightly to the right of midline with no neural impingement.   There is tiny broad-based bulge of the T12-L1 disc  with no neural impingement. Normal conus tip at L1.   IMPRESSION: 1. Benign appearing compression fracture of the superior aspect of T12 with no neural impingement. 2. Small central disc protrusion at T5-6 slightly distorts the ventral aspect of the spinal cord without myelopathy.  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   Hyponatremia 12/14/2018   Laryngopharyngeal reflux (LPR) 08/24/2017   Osteoporosis 08/24/2017   Leg cramping 08/21/2017   Regurgitation of food    Prolapse of female pelvic organs 02/19/2017   History of compression fracture of spine 07/18/2014   Hypertensive retinopathy of both eyes 06/04/2014   Nonexudative age-related macular degeneration 06/04/2014   Posterior vitreous detachment of both eyes 06/04/2014   Pseudophakia of both eyes 06/04/2014   Right knee pain 05/30/2014   Advanced care planning/counseling discussion 01/24/2014   Health maintenance examination 01/24/2014   Dysgeusia 11/12/2013   Upper airway cough syndrome 03/19/2013   Allergic conjunctivitis and rhinitis 05/15/2012   Asthma, moderate persistent 03/09/2012   Medicare annual wellness visit, subsequent 01/14/2012   Hiatal hernia    GERD (gastroesophageal reflux disease)    Essential hypertension    Hyperlipidemia associated with type 2 diabetes mellitus (HCC)    Depression    Hypothyroidism    Diabetes type 2, controlled (HCC)    Osteoarthritis    Past Medical History:  Diagnosis Date   Allergic rhinitis    Arthritis    hands and knees - ?osteo   Asthma    Chronic headaches    Compression fracture of T12 vertebra (HCC) 07/18/2014   S/p kyphoplasty by Dr Newell Coral (08/2014)    Depression    pt denies   Diabetes type 2, controlled (HCC) 2004   Environmental allergies    dust,mold,mildew   Extrinsic asthma    GERD (gastroesophageal reflux disease)    [name] EGD - mild esophageal dysmotility and small hiatal hernia   Hiatal hernia    Hx of migraines    Hyperlipidemia    mild, diet controlled   Hypertension    Hypothyroidism    Macular degeneration    Osteoporosis with fracture 07/2014   T -1.2 hip, T 0.2 spine, T12  compression fracture s/p kyphoplasty   Past Surgical History:  Procedure Laterality Date   73 HOUR PH STUDY N/A 04/11/2017   Procedure: 24 HOUR PH STUDY;  Surgeon: Napoleon Form, MD;  Location: [facility];  Service: Endoscopy;  Laterality: N/A;   CATARACT EXTRACTION  2003   bilateral with lens implant   COLONOSCOPY     DEXA  05/2008   improved (initial DEXA 2007 - mild osteopenia)   DILATION AND CURETTAGE OF UTERUS  1978   ESOPHAGEAL MANOMETRY N/A 04/11/2017   Procedure: ESOPHAGEAL MANOMETRY (EM);  Surgeon: Napoleon Form, MD;  Location: [facility];  Service: Endoscopy;  Laterality: N/A;   ESOPHAGOGASTRODUODENOSCOPY  06/25/2011   Procedure: ESOPHAGOGASTRODUODENOSCOPY (EGD);  Surgeon: Hart Carwin, MD;  Location: [facility];  Service: Endoscopy;  Laterality: N/A;  no Xray   KYPHOPLASTY N/A 08/30/2014   Procedure: THORACIC TWELVE KYPHOPLASTY;  Surgeon: [name], MD   LAPAROSCOPY ABDOMEN DIAGNOSTIC     To R/O endometriosis   PH IMPEDANCE STUDY N/A 04/11/2017   Procedure: PH IMPEDANCE STUDY;  Surgeon: Napoleon Form, MD;  Location: [facility];  Service: Endoscopy;  Laterality: N/A;   SAVORY DILATION  06/25/2011   Procedure: SAVORY DILATION;  Surgeon: Hart Carwin, MD;  Location: [facility];  Service: Endoscopy;  Laterality: N/A;   TONSILLECTOMY AND ADENOIDECTOMY  1962   US ECHOCARDIOGRAPHY  10/2014   nl systolic function EF 55%, mild diastolic dysfunction   Social History   Tobacco Use   Smoking status: Never   Smokeless tobacco: Never  Vaping Use   Vaping Use: Never used  Substance Use Topics   Alcohol use: No    Alcohol/week: 0.0 standard drinks   Drug use: No   Family History  Problem Relation Age of Onset   Mitral valve prolapse Father    Diabetes Father    Mitral valve prolapse Mother    Hypertension Mother    Stroke Mother 16       after valve surgery   Parkinson's disease Brother    Other Brother        POST WAR TRAUMA   Stroke Paternal  Grandfather    Heart failure Maternal Grandfather    Cancer Neg Hx    Colon cancer Neg Hx    Stomach cancer Neg Hx    Esophageal cancer Neg Hx    Pancreatic cancer Neg Hx    Liver disease Neg Hx    Allergies  Allergen Reactions   Ace Inhibitors Cough   Amlodipine Cough   Crestor [Rosuvastatin] Other (See Comments)    myalgias   Pregabalin Other (See Comments)    Unknown reaction   Tegretol [Carbamazepine] Other (See Comments)    Dizziness, headache   Wellbutrin [Bupropion Hcl] Other (See Comments)    Unknown reaction   Cymbalta [Duloxetine Hcl] Palpitations and Other (See Comments)    headaches   Lipitor [Atorvastatin] Cough   Current Outpatient Medications on File Prior to Visit  Medication Sig Dispense Refill   albuterol (VENTOLIN HFA) 108 (90 Base) MCG/ACT inhaler TAKE 2 PUFFS BY MOUTH EVERY 6 HOURS AS NEEDED FOR WHEEZE OR SHORTNESS OF BREATH 18 g 11   amLODipine (NORVASC) 5 MG tablet Take 1 tablet (5 mg total) by mouth daily. 90 tablet 3   aspirin 81 MG tablet Take 81 mg by mouth daily.     Budeson-Glycopyrrol-Formoterol (BREZTRI AEROSPHERE) 160-9-4.8 MCG/ACT AERO Inhale 2 puffs into the lungs 2 (two) times daily. 32.1 g 4   chlorpheniramine (CHLOR-TRIMETON) 4 MG tablet Take 4 mg by mouth 2 (two) times daily as needed for allergies.     chlorthalidone (HYGROTON) 25 MG tablet Take 1 tablet (25 mg total) by mouth daily. 90 tablet 1   Cholecalciferol (VITAMIN D3) 1000 units CAPS Take 1 capsule (1,000 Units total) by mouth daily. 30 capsule    diphenhydramine-acetaminophen (TYLENOL PM) 25-500 MG TABS tablet Take 1 tablet by mouth at bedtime as needed.     esomeprazole (NEXIUM) 40 MG capsule TAKE 1 CAPSULE BY MOUTH EVERY DAY BEFORE BREAKFAST 90 capsule 6   famotidine (PEPCID) 20 MG tablet TAKE 1 TABLET BY MOUTH EVERYDAY AT BEDTIME 90 tablet 6   ferrous sulfate 325 (65 FE) MG EC tablet Take 325 mg by mouth 3 (three)  times daily with meals.     Garlic (GARLIQUE PO) Take 1 tablet by  mouth daily.     levothyroxine (SYNTHROID) 100 MCG tablet Take 1 tablet (100 mcg total) by mouth daily before breakfast. 90 tablet 1   MAGNESIUM PO Take by mouth.     metFORMIN (GLUCOPHAGE) 500 MG tablet TAKE 1 TABLET BY MOUTH 2 TIMES DAILY WITH A MEAL. 180 tablet 0   montelukast (SINGULAIR) 10 MG tablet TAKE 1 TABLET BY MOUTH EVERYDAY AT BEDTIME 90 tablet 1   Omega-3 Fatty Acids (OMEGA 3 500 PO) Take by mouth.     polyethylene glycol (MIRALAX / GLYCOLAX) packet Take 8.5 g by mouth once a week.      Polyvinyl Alcohol-Povidone (REFRESH OP) Place 1 drop into both eyes daily.      Saline 0.9 % AERS Place into the nose as needed.     Specialty Vitamins Products (CARDIOVASCULAR SUPPORT PO) Take by mouth daily.     valsartan (DIOVAN) 320 MG tablet TAKE 1 TABLET (320 MG TOTAL) BY MOUTH DAILY. PLEASE KEEP UPCOMING APPT FOR FUTURE REFILLS. 90 tablet 3   No current facility-administered medications on file prior to visit.     Review of Systems  Constitutional:  Negative for activity change, appetite change, fatigue, fever and unexpected weight change.  HENT:  Negative for congestion, ear pain, rhinorrhea, sinus pressure and sore throat.   Eyes:  Negative for pain, redness and visual disturbance.  Respiratory:  Positive for cough. Negative for shortness of breath and wheezing.        Chronic cough  Cardiovascular:  Negative for chest pain and palpitations.  Gastrointestinal:  Negative for abdominal pain, blood in stool, constipation and diarrhea.  Endocrine: Negative for polydipsia and polyuria.  Genitourinary:  Negative for dysuria, frequency and urgency.  Musculoskeletal:  Positive for back pain. Negative for arthralgias and myalgias.  Skin:  Negative for pallor and rash.  Allergic/Immunologic: Negative for environmental allergies.  Neurological:  Negative for dizziness, syncope and headaches.  Hematological:  Negative for adenopathy. Does not bruise/bleed easily.  Psychiatric/Behavioral:   Negative for decreased concentration and dysphoric mood. The patient is not nervous/anxious.       Objective:   Physical Exam Constitutional:      General: She is not in acute distress.    Appearance: Normal appearance. She is not ill-appearing or diaphoretic.  Eyes:     Conjunctiva/sclera: Conjunctivae normal.     Pupils: Pupils are equal, round, and reactive to light.  Cardiovascular:     Rate and Rhythm: Regular rhythm. Tachycardia present.  Pulmonary:     Effort: Pulmonary effort is normal. No respiratory distress.  Musculoskeletal:     Cervical back: Normal range of motion and neck supple.     Thoracic back: Tenderness and bony tenderness present. Decreased range of motion.     Comments: Kyphosis noted  Tender over lower thoracic spinous processes  Limited flex/ext due to pain  No crepitus  No neuro changes   Lymphadenopathy:     Cervical: No cervical adenopathy.  Skin:    General: Skin is warm and dry.     Findings: No erythema or rash.  Neurological:     Mental Status: She is alert.     Motor: No weakness.     Coordination: Coordination normal.     Deep Tendon Reflexes: Reflexes normal.  Psychiatric:        Mood and Affect: Mood normal.  Assessment & Plan:   Problem List Items Addressed This Visit       Cardiovascular and Mediastinum   Essential hypertension    bp up with pain but controlled at home BP: (!) 160/85    Continue to monitor        Musculoskeletal and Integument   Osteoporosis    Past h/o OP with T12 compression fracture (2016)  Last dexa 2018 Pt declined another (now with new T11 fracture may change her mind)  Took forteo for 2 y in the past Also hypothyroid  Consider dexa order once she is improved from current fracture       Compression fracture of T11 vertebra (HCC) - Primary    This is new since end of august and per pt not healing well (seeing chiropractor)  Past T12 fracture 2016, reviewed those records and films  today Taking tylenol and advil prn with caution  Sent robaxin to pharmacy to use as needed with caution of sedation  XR ordered Pending result pt is interested in MRI also and ref to neuro surg to see if any kind of procedure is an option  Wearing a brace and taking ca and D May reconsider dexa and OP treatment now Disc fall prevention in detail  Has help from daughter        Relevant Orders   DG Thoracic Spine W/Swimmers   Other Visit Diagnoses     Need for influenza vaccination       Relevant Orders   Flu Vaccine QUAD High Dose(Fluad) (Completed)

## 2020-12-15 NOTE — Patient Instructions (Signed)
Let's get a T spine xray today  Then consider MRI  Try the methocarbamol in the meantime -caution of sedation   Be mindful of fall prevention  Use a walker on uneven ground   Ice and heat are ok to try (10 minutes at a time)

## 2020-12-15 NOTE — Assessment & Plan Note (Addendum)
Past h/o OP with T12 compression fracture (2016)  Last dexa 2018 Pt declined another (now with new T11 fracture may change her mind)  Took forteo for 2 y in the past Also hypothyroid  Consider dexa order once she is improved from current fracture

## 2020-12-15 NOTE — Assessment & Plan Note (Addendum)
This is new since end of august and per pt not healing well (seeing chiropractor)  Past T12 fracture 2016, reviewed those records and films today Taking tylenol and advil prn with caution  Sent robaxin to pharmacy to use as needed with caution of sedation  XR ordered Pending result pt is interested in MRI also and ref to neuro surg to see if any kind of procedure is an option  Wearing a brace and taking ca and D May reconsider dexa and OP treatment now Disc fall prevention in detail  Has help from daughter

## 2020-12-15 NOTE — Assessment & Plan Note (Signed)
bp up with pain but controlled at home BP: (!) 160/85    Continue to monitor

## 2020-12-16 ENCOUNTER — Telehealth: Payer: Self-pay | Admitting: Family Medicine

## 2020-12-16 DIAGNOSIS — S22080A Wedge compression fracture of T11-T12 vertebra, initial encounter for closed fracture: Secondary | ICD-10-CM

## 2020-12-16 NOTE — Telephone Encounter (Signed)
Compression fracture of T11 confirmed  I will go ahead and start the process to order MRI  Let her know they will get a call to get this going

## 2020-12-17 DIAGNOSIS — S233XXA Sprain of ligaments of thoracic spine, initial encounter: Secondary | ICD-10-CM | POA: Diagnosis not present

## 2020-12-17 DIAGNOSIS — M9902 Segmental and somatic dysfunction of thoracic region: Secondary | ICD-10-CM | POA: Diagnosis not present

## 2020-12-17 NOTE — Telephone Encounter (Signed)
Tried calling the patient and he did not answer. LVM for patient to call back.   

## 2020-12-17 NOTE — Telephone Encounter (Signed)
Pt returned call requesting a call back #[phone]

## 2020-12-18 NOTE — Telephone Encounter (Signed)
Left detailed message on patient's voicemail, ok per DPR on file, also patient saw her results on mychart per epic

## 2020-12-19 NOTE — Progress Notes (Signed)
HPI F never smoker followed for allergic rhinitis, asthma, complicated by GERD, hypothyroid, DM 2 FENO 12/29/15- 37-elevated, indicating allergic component to airway irritation. Office Spirometry 12/29/2015-moderately severe obstructive airways disease-FVC 1.99/60%, FEV1 1.29/51%, ratio 0.65, FEF 25-75 0.73/39%. Office Spirometry 08/17/2017- Moderate restriction.  FVC 2.1/66%, FEV1 1.6/65%, ratio 1.74, FEF 25-75% 1.3/65% CBC 08/18/2017-eos WNL 2.8, Allergy Profile 08/17/2017-elevated for dust mites, cat, pecan/hickory pollen, total IgE WNL 16 --------------------------------------------------------------------------------------------   11/22/19- 80 year old female never smoker followed for Allergic rhinitis, Asthma/ Chronic Cough, complicated by GERD/ HH (GI recs no Nissen at this time), hypothyroid, DM 2, HTN Ventolin hfa, Singulair,       Trelegy didn't help cough Pepcid/ Nexium Covid vax- 2 Phizer Flu vax- not yet ------chronic cough and asthma Mostly dry cough. Some LPR cough with crumbly foods. Discussed chin-tuck. Cough doesn't bother sleep. Some mild wheeze. Trelegy didn't help, but albuterol helps occ if needed. Mild exertional DOE. Questions benefit of Singulair.   12/22/20-  80 year old female never smoker followed for Allergic rhinitis, Asthma/ Chronic Cough, complicated by GERD/ HH (GI recs no Nissen at this time), hypothyroid, DM 2, HTN -Ventolin hfa, Breztri, Singulair, Pepcid/ Nexium,                        Trelegy didn't help cough Covid vax- 3 Phizer Flu vax-had Usimg albuterol HFA 1-2x/ week- does help with cough. Asks about trying Primatiene( inhaled Epi)- I advised against- stimulant. Cough still described as occasional, dry. Not clearly assoc w reflux, swallowing, environment. No acute issues or significant respiratory limitation. Cough does aggravate back pain for which she will he having surgery.  ROS-see HPI    += positive Constitutional:   No-   weight loss, night  sweats, fevers, chills, fatigue, lassitude. HEENT:   No-  headaches, difficulty swallowing, tooth/dental problems, sore throat,       +sneezing, itching, ear ache, nasal congestion, post nasal drip,  CV:  No-   chest pain, orthopnea, PND, swelling in lower extremities, anasarca,                                                       dizziness, palpitations Resp: No-   shortness of breath with exertion or at rest.              No-   productive cough,  + non-productive cough,  No- coughing up of blood.              No-   change in color of mucus.+ wheezing.   Skin: No-   rash or lesions. GI:  +  heartburn, indigestion, No-abdominal pain, nausea, vomiting, GU:  MS:  No-   joint pain or swelling.  + back pain Neuro-     nothing unusual Psych:  No- change in mood or affect. No depression or anxiety.  No memory loss.  OBJ- Physical Exam General- Alert, Oriented, Affect-appropriate, Distress- none acute     Daughter here Skin- rash-none, lesions- none, excoriation- none Lymphadenopathy- none Head- atraumatic            Eyes- Gross vision intact, PERRLA, conjunctivae and secretions clear            Ears- clear            Nose- Clear, no-Septal dev, mucus, polyps, erosion,  perforation             Throat- Mallampati II , mucosa clear , drainage+white, tonsils- atrophic Neck- flexible , trachea midline, no stridor , thyroid nl, carotid no bruit Chest - symmetrical excursion , unlabored           Heart/CV- RRR , no murmur , no gallop  , no rub, nl s1 s2                           - JVD- none , edema- none, stasis changes- none, varices- none           Lung- clear to P&A/ no crackles, wheeze- none, cough+ dry with deep breath , dullness-none, rub- none           Chest wall- + back brace Abd-  Br/ Gen/ Rectal- Not done, not indicated Extrem- cyanosis- none, clubbing, none, atrophy- none, strength- nl Neuro- grossly intact to observation

## 2020-12-22 ENCOUNTER — Ambulatory Visit: Payer: Medicare PPO | Admitting: Internal Medicine

## 2020-12-22 ENCOUNTER — Encounter: Payer: Self-pay | Admitting: Internal Medicine

## 2020-12-22 ENCOUNTER — Other Ambulatory Visit: Payer: Self-pay

## 2020-12-22 DIAGNOSIS — J4540 Moderate persistent asthma, uncomplicated: Secondary | ICD-10-CM

## 2020-12-22 DIAGNOSIS — K219 Gastro-esophageal reflux disease without esophagitis: Secondary | ICD-10-CM | POA: Diagnosis not present

## 2020-12-22 DIAGNOSIS — R058 Other specified cough: Secondary | ICD-10-CM | POA: Diagnosis not present

## 2020-12-22 DIAGNOSIS — S233XXA Sprain of ligaments of thoracic spine, initial encounter: Secondary | ICD-10-CM | POA: Diagnosis not present

## 2020-12-22 DIAGNOSIS — M9902 Segmental and somatic dysfunction of thoracic region: Secondary | ICD-10-CM | POA: Diagnosis not present

## 2020-12-22 MED ORDER — ALBUTEROL SULFATE HFA 108 (90 BASE) MCG/ACT IN AERS: INHALATION_SPRAY | RESPIRATORY_TRACT | 11 refills | Status: DC

## 2020-12-22 NOTE — Patient Instructions (Addendum)
Refill sent for albuterol rescue inhaler  Please call if we can help  Pulmonary - clear

## 2020-12-23 DIAGNOSIS — H353131 Nonexudative age-related macular degeneration, bilateral, early dry stage: Secondary | ICD-10-CM | POA: Diagnosis not present

## 2020-12-23 DIAGNOSIS — Z961 Presence of intraocular lens: Secondary | ICD-10-CM | POA: Diagnosis not present

## 2020-12-23 DIAGNOSIS — H43813 Vitreous degeneration, bilateral: Secondary | ICD-10-CM | POA: Diagnosis not present

## 2020-12-23 DIAGNOSIS — H35033 Hypertensive retinopathy, bilateral: Secondary | ICD-10-CM | POA: Diagnosis not present

## 2020-12-23 LAB — HM DIABETES EYE EXAM

## 2020-12-24 ENCOUNTER — Ambulatory Visit
Admission: RE | Admit: 2020-12-24 | Discharge: 2020-12-24 | Disposition: A | Discharge status: Home / Self Care | Payer: Medicare PPO | Source: Ambulatory Visit | Attending: Family Medicine | Admitting: Family Medicine

## 2020-12-24 DIAGNOSIS — M5124 Other intervertebral disc displacement, thoracic region: Secondary | ICD-10-CM | POA: Diagnosis not present

## 2020-12-24 DIAGNOSIS — S22080A Wedge compression fracture of T11-T12 vertebra, initial encounter for closed fracture: Secondary | ICD-10-CM

## 2020-12-24 DIAGNOSIS — S233XXA Sprain of ligaments of thoracic spine, initial encounter: Secondary | ICD-10-CM | POA: Diagnosis not present

## 2020-12-24 DIAGNOSIS — M9902 Segmental and somatic dysfunction of thoracic region: Secondary | ICD-10-CM | POA: Diagnosis not present

## 2020-12-26 ENCOUNTER — Encounter: Payer: Self-pay | Admitting: Family Medicine

## 2020-12-29 DIAGNOSIS — M9902 Segmental and somatic dysfunction of thoracic region: Secondary | ICD-10-CM | POA: Diagnosis not present

## 2020-12-29 DIAGNOSIS — S233XXA Sprain of ligaments of thoracic spine, initial encounter: Secondary | ICD-10-CM | POA: Diagnosis not present

## 2020-12-31 DIAGNOSIS — S233XXA Sprain of ligaments of thoracic spine, initial encounter: Secondary | ICD-10-CM | POA: Diagnosis not present

## 2020-12-31 DIAGNOSIS — M9902 Segmental and somatic dysfunction of thoracic region: Secondary | ICD-10-CM | POA: Diagnosis not present

## 2021-01-04 ENCOUNTER — Other Ambulatory Visit: Payer: Medicare PPO

## 2021-01-05 ENCOUNTER — Telehealth: Payer: Self-pay | Admitting: Internal Medicine

## 2021-01-05 ENCOUNTER — Telehealth: Payer: Self-pay | Admitting: Family Medicine

## 2021-01-05 DIAGNOSIS — I10 Essential (primary) hypertension: Secondary | ICD-10-CM | POA: Diagnosis not present

## 2021-01-05 DIAGNOSIS — Z6825 Body mass index (BMI) 25.0-25.9, adult: Secondary | ICD-10-CM | POA: Diagnosis not present

## 2021-01-05 DIAGNOSIS — S22060A Wedge compression fracture of T7-T8 vertebra, initial encounter for closed fracture: Secondary | ICD-10-CM | POA: Diagnosis not present

## 2021-01-05 DIAGNOSIS — S22070A Wedge compression fracture of T9-T10 vertebra, initial encounter for closed fracture: Secondary | ICD-10-CM | POA: Diagnosis not present

## 2021-01-05 NOTE — Telephone Encounter (Signed)
?   If pt needs surgical clearance or are you okay to clear her now?

## 2021-01-05 NOTE — Telephone Encounter (Signed)
She will need clearance from pulmonary (Dr Maple Hudson) and cardiology (Dr Katrinka Blazing) Last few bp readings were up -how are they at home?  How is her blood sugar running?  Is due for a1c   When is her surgery scheduled?   Also I see a history of anesthesia problems (and family h/o anesthesia problems) in the chart but it does not mention what  Can they clarify? Thanks

## 2021-01-05 NOTE — Telephone Encounter (Signed)
Pt daughter called in stating that pt needs medical clearance for surgery from provider. Pt daughter request to be called for additional information regarding clearance [phone] [facility] Cadd[facility]

## 2021-01-06 NOTE — Telephone Encounter (Signed)
Pt/family also sent a mychart message regarding this so I sent her the questions via mychart and will await a response

## 2021-01-06 NOTE — Telephone Encounter (Signed)
Call made to patient, confirmed DOB. She is requesting a letter for surgery clearance for a kyphoplasty. It has not yet been scheduled. Fax and phone number given to surgeon.   CY please advise if okay to write surgical clearance letter? Thanks :)

## 2021-01-06 NOTE — Telephone Encounter (Signed)
You will need clearance from pulmonary (Dr Maple Hudson) and cardiology (Dr Katrinka Blazing):  pt is aware and has already called both providers (see phone notes in charts)   Your last few bp readings were up -how are they at home?: Pt is not checking BP at home because she knows they are high because she is in sever pain. She said she can't tell they are hight because she never gets a HA or any other sxs but given pain she's pretty sure it's been elevated.  How is your blood sugar running? (you are due for your a1c check):  Pt said she's not checking her blood sugar at home either she's not doing much right now given how much pain she has been in   When is your surgery scheduled?: not scheduled yet they want clearance with pt's other doc's 1st before scheduling anything, to make sure she will actually be cleared given age and issues.   Dr. Milinda Antis saw a history of anesthesia problems (and family history of anesthesia problems) in the chart but it does not mention what, Can you clarify what problems happened?: Pt said she's doesn't remember any issues she's ever had with any surgery and anesthesia she said she isn't sure why that's in her chart but she hasn't had any issues with anesthesia before  **FYI: PT SAID SURGEON SHOULD BE FAXING OVER OFFICIAL SURGICAL CLEARANCE FORM TO [place]**

## 2021-01-07 ENCOUNTER — Encounter: Payer: Self-pay | Admitting: Internal Medicine

## 2021-01-07 ENCOUNTER — Telehealth: Payer: Self-pay | Admitting: *Deleted

## 2021-01-07 DIAGNOSIS — M9902 Segmental and somatic dysfunction of thoracic region: Secondary | ICD-10-CM | POA: Diagnosis not present

## 2021-01-07 DIAGNOSIS — S233XXA Sprain of ligaments of thoracic spine, initial encounter: Secondary | ICD-10-CM | POA: Diagnosis not present

## 2021-01-07 NOTE — Telephone Encounter (Signed)
   Pre-operative Risk Assessment    Patient Name: Jordan Patterson  DOB: 1940/08/25 MRN: [id number]      Request for Surgical Clearance   Procedure:   T8, T9 KYPHOPLASTY  Date of Surgery: Clearance TBD                                 Surgeon:  DR. KYLE CABBELL Surgeon's Group or Practice Name:  [facility] Phone number:  [phone] Fax number:  [phone] ATTN: JESSICA   Type of Clearance Requested: - Medical  - Pharmacy:  Hold Aspirin     Type of Anesthesia:   General    Additional requests/questions:   Elpidio Anis   01/07/2021, 4:06 PM

## 2021-01-07 NOTE — Telephone Encounter (Signed)
Pt called in wants to  know status on medical clearance pt  has questions and concerns  Would like a call back #[phone]

## 2021-01-07 NOTE — Telephone Encounter (Signed)
I am waiting on the paperwork to see what they require (some require labs/ etc)   Need word that pulmonary and cardiology have both cleared her   I need to know how her glucose readings are if she can do a few

## 2021-01-07 NOTE — Assessment & Plan Note (Signed)
Continue reflux precautions 

## 2021-01-07 NOTE — Assessment & Plan Note (Signed)
Uncomplicated Plan- refill albuterol rescue inhaler to hold  Clear from Pulmonary standpoint for planned back surgery

## 2021-01-07 NOTE — Telephone Encounter (Signed)
   Primary Cardiologist: Lesleigh Noe, MD  Chart reviewed as part of pre-operative protocol coverage. Given past medical history and time since last visit, based on ACC/AHA guidelines, Jordan Patterson would be at acceptable risk for the planned procedure without further cardiovascular testing.   Her aspirin may be held for 5 to 7 days prior to her surgery.  Please resume as soon as hemostasis is achieved.  Patient was advised that if she develops new symptoms prior to surgery to contact our office to arrange a follow-up appointment.  She verbalized understanding.  I will route this recommendation to the requesting party via Epic fax function and remove from pre-op pool.  Please call with questions.  Thomasene Ripple. Jeanny Rymer NP-C    01/07/2021, 4:27 PM [facility] [address] Office [phone] Fax [phone]

## 2021-01-07 NOTE — Assessment & Plan Note (Signed)
Encourage sips, throat lozenges, avoid throat clearing. Ok to use otc cough syrups.

## 2021-01-08 ENCOUNTER — Other Ambulatory Visit: Payer: Self-pay | Admitting: Neurosurgery

## 2021-01-08 NOTE — Telephone Encounter (Signed)
Called and spoke with patient to let her know that forms has been received and I am going to fax it back with office notes to [facility]. Patient expressed understanding. Nothing further needed at this time.

## 2021-01-09 ENCOUNTER — Encounter: Payer: Self-pay | Admitting: Family Medicine

## 2021-01-12 DIAGNOSIS — M9902 Segmental and somatic dysfunction of thoracic region: Secondary | ICD-10-CM | POA: Diagnosis not present

## 2021-01-12 DIAGNOSIS — S233XXA Sprain of ligaments of thoracic spine, initial encounter: Secondary | ICD-10-CM | POA: Diagnosis not present

## 2021-01-14 ENCOUNTER — Encounter: Payer: Self-pay | Admitting: Family Medicine

## 2021-01-14 NOTE — Telephone Encounter (Signed)
Done and in IN box Saw in chart where card and pulm already cleared her  They will need to watch blood sugar

## 2021-01-14 NOTE — Telephone Encounter (Signed)
Left VM letting pt know surgical clearance done and form faxed to surgeon's office. I advised pt of Dr. Royden Purl comments regarding blood sugar on VM also

## 2021-01-14 NOTE — Telephone Encounter (Signed)
Called surgeon's office and requesting surgical clearance forms.  Called pt and she said she doesn't have a meter or any supplies to check blood sugar with but the last few times she had labs it was stable and she has been watching her diet also.  Pt did say cardiology and pulmonary cleared her for surgery.

## 2021-01-19 DIAGNOSIS — S233XXA Sprain of ligaments of thoracic spine, initial encounter: Secondary | ICD-10-CM | POA: Diagnosis not present

## 2021-01-19 DIAGNOSIS — M9902 Segmental and somatic dysfunction of thoracic region: Secondary | ICD-10-CM | POA: Diagnosis not present

## 2021-01-19 NOTE — Progress Notes (Signed)
Surgical Instructions    Your procedure is scheduled on 01/22/21.  Report to [facility] Main Entrance "A" at 2:00 P.M., then check in with the Admitting office.  Call this number if you have problems the morning of surgery:  [phone]   If you have any questions prior to your surgery date call [phone]: Open Monday-Friday 8am-4pm    Remember:  Do not eat after midnight the night before your surgery  You may drink clear liquids until 1:00PM the morning of your surgery.   Clear liquids allowed are: Water, Non-Citrus Juices (without pulp), Carbonated Beverages, Clear Tea, Black Coffee ONLY (NO MILK, CREAM OR POWDERED CREAMER of any kind), and Gatorade    Take these medicines the morning of surgery with A SIP OF WATER  amLODipine (NORVASC)  Budeson-Glycopyrrol-Formoterol (BREZTRI AEROSPHERE)  chlorpheniramine (CHLOR-TRIMETON) esomeprazole (NEXIUM) levothyroxine (SYNTHROID)  Polyvinyl Alcohol-Povidone (REFRESH OP)  IF NEEDED: albuterol (VENTOLIN HFA) inhaler (bring with you the day of surgery)  As of today, STOP taking any Aspirin (unless otherwise instructed by your surgeon) Aleve, Naproxen, Ibuprofen, Motrin, Advil, Goody's, BC's, all herbal medications, fish oil, and all vitamins.  WHAT DO I DO ABOUT MY DIABETES MEDICATION?   Do not take oral diabetes medicines (pills) the morning of surgery.    THE MORNING OF SURGERY, do not take metFORMIN (GLUCOPHAGE).  The day of surgery, do not take other diabetes injectables, including Byetta (exenatide), Bydureon (exenatide ER), Victoza (liraglutide), or Trulicity (dulaglutide).  If your CBG is greater than 220 mg/dL, you may take  of your sliding scale (correction) dose of insulin.   HOW TO MANAGE YOUR DIABETES BEFORE AND AFTER SURGERY  Why is it important to control my blood sugar before and after surgery? Improving blood sugar levels before and after surgery helps healing and can limit problems. A way of improving blood  sugar control is eating a healthy diet by:  Eating less sugar and carbohydrates  Increasing activity/exercise  Talking with your doctor about reaching your blood sugar goals High blood sugars (greater than 180 mg/dL) can raise your risk of infections and slow your recovery, so you will need to focus on controlling your diabetes during the weeks before surgery. Make sure that the doctor who takes care of your diabetes knows about your planned surgery including the date and location.  How do I manage my blood sugar before surgery? Check your blood sugar at least 4 times a day, starting 2 days before surgery, to make sure that the level is not too high or low.  Check your blood sugar the morning of your surgery when you wake up and every 2 hours until you get to the Short Stay unit.  If your blood sugar is less than 70 mg/dL, you will need to treat for low blood sugar: Do not take insulin. Treat a low blood sugar (less than 70 mg/dL) with  cup of clear juice (cranberry or apple), 4 glucose tablets, OR glucose gel. Recheck blood sugar in 15 minutes after treatment (to make sure it is greater than 70 mg/dL). If your blood sugar is not greater than 70 mg/dL on recheck, call [phone] for further instructions. Report your blood sugar to the short stay nurse when you get to Short Stay.  If you are admitted to the hospital after surgery: Your blood sugar will be checked by the staff and you will probably be given insulin after surgery (instead of oral diabetes medicines) to make sure you have good blood sugar levels. The goal  for blood sugar control after surgery is 80-180 mg/dL.    After your COVID test   You are not required to quarantine however you are required to wear a well-fitting mask when you are out and around people not in your household.  If your mask becomes wet or soiled, replace with a new one.  Wash your hands often with soap and water for 20 seconds or clean your hands with an  alcohol-based hand sanitizer that contains at least 60% alcohol.  Do not share personal items.  Notify your provider: if you are in close contact with someone who has COVID  or if you develop a fever of 100.4 or greater, sneezing, cough, sore throat, shortness of breath or body aches.             Do not wear jewelry or makeup Do not wear lotions, powders, perfumes/colognes, or deodorant. Do not shave 48 hours prior to surgery.   Do not bring valuables to the hospital. DO Not wear nail polish, gel polish, artificial nails, or any other type of covering on natural nails including finger and toenails. If patients have artificial nails, gel coating, etc. that need to be removed by a nail salon, please have this removed prior to surgery or surgery may need to be canceled/delayed if the surgeon/ anesthesia feels like the patient is unable to be adequately monitored.             [facility] is not responsible for any belongings or valuables.  Do NOT Smoke (Tobacco/Vaping)  24 hours prior to your procedure  If you use a CPAP at night, you may bring your mask for your overnight stay.   Contacts, glasses, hearing aids, dentures or partials may not be worn into surgery, please bring cases for these belongings   For patients admitted to the hospital, discharge time will be determined by your treatment team.   Patients discharged the day of surgery will not be allowed to drive home, and someone needs to stay with them for 24 hours.  NO VISITORS WILL BE ALLOWED IN PRE-OP WHERE PATIENTS ARE PREPPED FOR SURGERY.  ONLY 1 SUPPORT PERSON MAY BE PRESENT IN THE WAITING ROOM WHILE YOU ARE IN SURGERY.  IF YOU ARE TO BE ADMITTED, ONCE YOU ARE IN YOUR ROOM YOU WILL BE ALLOWED TWO (2) VISITORS. 1 (ONE) VISITOR MAY STAY OVERNIGHT BUT MUST ARRIVE TO THE ROOM BY 8pm.  Minor children may have two parents present. Special consideration for safety and communication needs will be reviewed on a case by case  basis.  Special instructions:    Oral Hygiene is also important to reduce your risk of infection.  Remember - BRUSH YOUR TEETH THE MORNING OF SURGERY WITH YOUR REGULAR TOOTHPASTE   [facility]- Preparing For Surgery  Before surgery, you can play an important role. Because skin is not sterile, your skin needs to be as free of germs as possible. You can reduce the number of germs on your skin by washing with CHG (chlorahexidine gluconate) Soap before surgery.  CHG is an antiseptic cleaner which kills germs and bonds with the skin to continue killing germs even after washing.     Please do not use if you have an allergy to CHG or antibacterial soaps. If your skin becomes reddened/irritated stop using the CHG.  Do not shave (including legs and underarms) for at least 48 hours prior to first CHG shower. It is OK to shave your face.  Please follow these  instructions carefully.     Shower the NIGHT BEFORE SURGERY and the MORNING OF SURGERY with CHG Soap.   If you chose to wash your hair, wash your hair first as usual with your normal shampoo. After you shampoo, rinse your hair and body thoroughly to remove the shampoo.  Then [employer] and genitals (private parts) with your normal soap and rinse thoroughly to remove soap.  After that Use CHG Soap as you would any other liquid soap. You can apply CHG directly to the skin and wash gently with a scrungie or a clean washcloth.   Apply the CHG Soap to your body ONLY FROM THE NECK DOWN.  Do not use on open wounds or open sores. Avoid contact with your eyes, ears, mouth and genitals (private parts). Wash Face and genitals (private parts)  with your normal soap.   Wash thoroughly, paying special attention to the area where your surgery will be performed.  Thoroughly rinse your body with warm water from the neck down.  DO NOT shower/wash with your normal soap after using and rinsing off the CHG Soap.  Pat yourself dry with a CLEAN TOWEL.  Wear CLEAN  PAJAMAS to bed the night before surgery  Place CLEAN SHEETS on your bed the night before your surgery  DO NOT SLEEP WITH PETS.   Day of Surgery: Take a shower with CHG soap. Wear Clean/Comfortable clothing the morning of surgery Do not apply any deodorants/lotions.   Remember to brush your teeth WITH YOUR REGULAR TOOTHPASTE.   Please read over the following fact sheets that you were given.

## 2021-01-20 ENCOUNTER — Other Ambulatory Visit: Payer: Self-pay

## 2021-01-20 ENCOUNTER — Encounter (HOSPITAL_COMMUNITY): Payer: Self-pay

## 2021-01-20 ENCOUNTER — Encounter (HOSPITAL_COMMUNITY)
Admission: RE | Admit: 2021-01-20 | Discharge: 2021-01-20 | Disposition: A | Discharge status: Home / Self Care | Payer: Medicare PPO | Source: Ambulatory Visit | Attending: Neurosurgery | Admitting: Neurosurgery

## 2021-01-20 VITALS — BP 159/98 | HR 86 | Temp 98.0°F | Resp 19 | Ht 67.5 in | Wt 171.8 lb

## 2021-01-20 DIAGNOSIS — E039 Hypothyroidism, unspecified: Secondary | ICD-10-CM | POA: Diagnosis not present

## 2021-01-20 DIAGNOSIS — K219 Gastro-esophageal reflux disease without esophagitis: Secondary | ICD-10-CM | POA: Insufficient documentation

## 2021-01-20 DIAGNOSIS — Z01812 Encounter for preprocedural laboratory examination: Secondary | ICD-10-CM | POA: Insufficient documentation

## 2021-01-20 DIAGNOSIS — K449 Diaphragmatic hernia without obstruction or gangrene: Secondary | ICD-10-CM | POA: Diagnosis not present

## 2021-01-20 DIAGNOSIS — I10 Essential (primary) hypertension: Secondary | ICD-10-CM | POA: Diagnosis not present

## 2021-01-20 DIAGNOSIS — M4854XA Collapsed vertebra, not elsewhere classified, thoracic region, initial encounter for fracture: Secondary | ICD-10-CM | POA: Diagnosis not present

## 2021-01-20 DIAGNOSIS — Z20822 Contact with and (suspected) exposure to covid-19: Secondary | ICD-10-CM | POA: Insufficient documentation

## 2021-01-20 DIAGNOSIS — J45909 Unspecified asthma, uncomplicated: Secondary | ICD-10-CM | POA: Diagnosis not present

## 2021-01-20 DIAGNOSIS — E785 Hyperlipidemia, unspecified: Secondary | ICD-10-CM | POA: Diagnosis not present

## 2021-01-20 DIAGNOSIS — Z7982 Long term (current) use of aspirin: Secondary | ICD-10-CM | POA: Insufficient documentation

## 2021-01-20 DIAGNOSIS — E119 Type 2 diabetes mellitus without complications: Secondary | ICD-10-CM | POA: Insufficient documentation

## 2021-01-20 DIAGNOSIS — Z01818 Encounter for other preprocedural examination: Secondary | ICD-10-CM

## 2021-01-20 LAB — CBC
HCT: 40.9 % (ref 36.0–46.0)
Hemoglobin: 13.5 g/dL (ref 12.0–15.0)
MCH: 30.1 pg (ref 26.0–34.0)
MCHC: 33 g/dL (ref 30.0–36.0)
MCV: 91.1 fL (ref 80.0–100.0)
Platelets: 216 10*3/uL (ref 150–400)
RBC: 4.49 MIL/uL (ref 3.87–5.11)
RDW: 13.3 % (ref 11.5–15.5)
WBC: 7 10*3/uL (ref 4.0–10.5)
nRBC: 0 % (ref 0.0–0.2)

## 2021-01-20 LAB — GLUCOSE, CAPILLARY: Glucose-Capillary: 96 mg/dL (ref 70–99)

## 2021-01-20 LAB — BASIC METABOLIC PANEL
Anion gap: 11 (ref 5–15)
BUN: 11 mg/dL (ref 8–23)
CO2: 25 mmol/L (ref 22–32)
Calcium: 9.2 mg/dL (ref 8.9–10.3)
Chloride: 94 mmol/L — ABNORMAL LOW (ref 98–111)
Creatinine, Ser: 0.81 mg/dL (ref 0.44–1.00)
GFR, Estimated: >60 mL/min (ref >60)
Glucose, Bld: 94 mg/dL (ref 70–99)
Potassium: 3.8 mmol/L (ref 3.5–5.1)
Sodium: 130 mmol/L — ABNORMAL LOW (ref 135–145)

## 2021-01-20 LAB — SURGICAL PCR SCREEN
MRSA, PCR: NEGATIVE
Staphylococcus aureus: NEGATIVE

## 2021-01-20 LAB — SARS CORONAVIRUS 2 (TAT 6-24 HRS): SARS Coronavirus 2: NEGATIVE

## 2021-01-20 NOTE — Progress Notes (Signed)
PCP - Roxy Manns, MD Cardiologist - Lyn Records, MD  PPM/ICD - denies Device Orders - n/a Rep Notified - n/a  Chest x-ray - n/a EKG - 10/01/2020 Stress Test - denies ECHO - 11/22/2014 Cardiac Cath - denies  Sleep Study - denies CPAP - n/a  Fasting Blood Sugar - patient is not checking CBG at home CBG today -96 A1C - done in PAT 01/20/2021  Blood Thinner Instructions: Aspirin - last dose - 01/14/2021 per patient  Aspirin Instructions: Patient was instructed: As of today, STOP taking any Aspirin (unless otherwise instructed by your surgeon) Aleve, Naproxen, Ibuprofen, Motrin, Advil, Goody's, BC's, all herbal medications, fish oil, and all vitamins.  ERAS Protcol - yes PRE-SURGERY Ensure or G2- no  COVID TEST- done in PAT 01/20/2021   Anesthesia review: yes - cardiac clearance   Patient denies shortness of breath, fever, cough and chest pain at PAT appointment   All instructions explained to the patient, with a verbal understanding of the material. Patient agrees to go over the instructions while at home for a better understanding. Patient also instructed to self quarantine after being tested for COVID-19. The opportunity to ask questions was provided.

## 2021-01-21 LAB — HEMOGLOBIN A1C
Hgb A1c MFr Bld: 7.2 % — ABNORMAL HIGH (ref 4.8–5.6)
Mean Plasma Glucose: 160 mg/dL

## 2021-01-21 NOTE — Anesthesia Preprocedure Evaluation (Addendum)
Anesthesia Evaluation  Patient identified by MRN, date of birth, ID band Patient awake    Reviewed: Allergy & Precautions, NPO status , Patient's Chart, lab work & pertinent test results  Airway Mallampati: II  TM Distance: >3 FB Neck ROM: Full    Dental  (+) Dental Advisory Given   Pulmonary asthma ,    breath sounds clear to auscultation       Cardiovascular hypertension, Pt. on medications  Rhythm:Regular Rate:Normal     Neuro/Psych  Headaches,    GI/Hepatic Neg liver ROS, hiatal hernia, GERD  ,  Endo/Other  diabetes, Type 2, Oral Hypoglycemic AgentsHypothyroidism   Renal/GU negative Renal ROS     Musculoskeletal  (+) Arthritis ,   Abdominal   Peds  Hematology negative hematology ROS (+)   Anesthesia Other Findings   Reproductive/Obstetrics                            Lab Results  Component Value Date   WBC 7.0 01/20/2021   HGB 13.5 01/20/2021   HCT 40.9 01/20/2021   MCV 91.1 01/20/2021   PLT 216 01/20/2021   Lab Results  Component Value Date   CREATININE 0.81 01/20/2021   BUN 11 01/20/2021   NA 130 (L) 01/20/2021   K 3.8 01/20/2021   CL 94 (L) 01/20/2021   CO2 25 01/20/2021    Anesthesia Physical Anesthesia Plan  ASA: 2  Anesthesia Plan: General   Post-op Pain Management: Tylenol PO (pre-op)   Induction: Intravenous  PONV Risk Score and Plan: 3 and Ondansetron, Dexamethasone and Treatment may vary due to age or medical condition  Airway Management Planned: Oral ETT  Additional Equipment: None  Intra-op Plan:   Post-operative Plan: Extubation in OR  Informed Consent: I have reviewed the patients History and Physical, chart, labs and discussed the procedure including the risks, benefits and alternatives for the proposed anesthesia with the patient or authorized representative who has indicated his/her understanding and acceptance.     Dental advisory  given  Plan Discussed with: CRNA  Anesthesia Plan Comments: ( )       Anesthesia Quick Evaluation

## 2021-01-21 NOTE — Progress Notes (Signed)
Anesthesia Chart Review:  Case: [id number] Date/Time: 01/22/21 1547   Procedure: T8, T9 KYPHOPLASTY - 3C/RM 18   Anesthesia type: General   Pre-op diagnosis: Compression fracture of t9 vertebra   [facility]: [facility] ROOM 18 / [facility] OR   Surgeons: Jordan Memos, MD       DISCUSSION: Patient is an 80 year old female scheduled for the above procedure.   History includes never smoker, HTN, HLD, DM2, asthma, hypothyroidism, GERD, hiatal hernia, thoracic compression fractures (s/p T12 kyphoplasty 07/18/14).   Preoperative cardiology input outlined by Jordan Fabian, NP on 01/07/21, "Given past medical history and time since last visit, based on ACC/AHA guidelines, Jordan Patterson would be at acceptable risk for the planned procedure without further cardiovascular testing.    Her aspirin may be held for 5 to 7 days prior to her surgery..."    Last pulmonology evaluation by Dr. Maple Patterson 12/22/2020. Note reviewed. He wrote, "Clear from Pulmonary standpoint for planned back surgery".  PCP Dr. Milinda Patterson also aware of surgery plans, and advised "They will need to watch blood sugar".  01/20/2021 presurgical COVID-19 test negative.  Anesthesia team to evaluate on the day of surgery.   VS: BP (!) 159/98   Pulse 86   Temp 36.7 C (Oral)   Resp 19   Ht 5' 7.5" (1.715 m)   Wt 77.9 kg   SpO2 100%   BMI 26.51 kg/m   PROVIDERS: Patterson, Jordan Gallus, MD is PCP - Jordan Prime, MD is cardiologist. Last visit 10/01/20 for HTN, HLD follow-up. She was walking 2 miles most mornings without chest pain, SOB, or leg discomfort. She did not some coughing after swallowing and was concerned about related to medication (history ACEi cough) or HF symptoms. He advised "intensify the dose of diuretic therapy" but did not feel she required any imaging studies. (Pulmonology also following for upper airway cough syndrome, asthma.) - Jordan Duhamel, MD is pulmonologist   LABS: Labs reviewed: Acceptable for surgery. (all labs ordered are  listed, but only abnormal results are displayed)  Labs Reviewed  HEMOGLOBIN A1C - Abnormal; Notable for the following components:      Result Value   Hgb A1c MFr Bld 7.2 (*)    All other components within normal limits  BASIC METABOLIC PANEL - Abnormal; Notable for the following components:   Sodium 130 (*)    Chloride 94 (*)    All other components within normal limits  SARS CORONAVIRUS 2 (TAT 6-24 HRS)  SURGICAL PCR SCREEN  GLUCOSE, CAPILLARY  CBC    Spirometry 08/17/2017: "Moderate restriction.  FVC 2.1/66%, FEV1 1.6/65%, ratio 1.74, FEF 25-75% 1.3/65%" ("Allergy Profile 08/17/2017-elevated for dust mites, cat, pecan/hickory pollen, total IgE WNL 16")   IMAGES: MRI T-spine 12/24/20: IMPRESSION: 1. Acute appearing compression deformities of T8 and T9, with approximately 80% height loss at T8 and 15% height loss at T9. No retropulsion of fracture fragments, abnormal spinal cord signal, or spinal cord stenosis. 2. 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    EKG: 10/01/2020: Normal sinus rhythm.  Low voltage.   CV: Echo 11/22/14: Study Conclusions  - Left ventricle: The cavity size was normal. There was mild focal    basal and mild concentric hypertrophy of the septum. Systolic    function was normal. The estimated ejection fraction was 55%.    Wall motion was normal; there were no regional wall motion    abnormalities. Doppler parameters are consistent with  abnormal    left ventricular relaxation (grade 1 diastolic dysfunction).    Doppler parameters are consistent with elevated ventricular    end-diastolic filling pressure.  - Aortic valve: There was trivial regurgitation.  - Aorta: Root not well seen bright reverbatration artifact seen in    PLA veiw.  - Atrial septum: No defect or patent foramen ovale was identified.    Past Medical History:  Diagnosis Date   Allergic rhinitis     Arthritis    hands and knees - ?osteo   Asthma    Chronic headaches    Compression fracture of T12 vertebra (HCC) 07/18/2014   S/p kyphoplasty by Dr Jordan Patterson (08/2014)    Depression    pt denies   Diabetes type 2, controlled (HCC) 2004   Environmental allergies    dust,mold,mildew   Extrinsic asthma    GERD (gastroesophageal reflux disease)    [name] EGD - mild esophageal dysmotility and small hiatal hernia   Hiatal hernia    Hx of migraines    Hyperlipidemia    mild, diet controlled   Hypertension    Hypothyroidism    Macular degeneration    Osteoporosis with fracture 07/2014   T -1.2 hip, T 0.2 spine, T12 compression fracture s/p kyphoplasty    Past Surgical History:  Procedure Laterality Date   39 HOUR PH STUDY N/A 04/11/2017   Procedure: 24 HOUR PH STUDY;  Surgeon: Jordan Form, MD;  Location: [facility];  Service: Endoscopy;  Laterality: N/A;   BACK SURGERY     CATARACT EXTRACTION  02/22/2001   bilateral with lens implant   COLONOSCOPY     DEXA  05/23/2008   improved (initial DEXA 2007 - mild osteopenia)   DILATION AND CURETTAGE OF UTERUS  02/23/1976   ESOPHAGEAL MANOMETRY N/A 04/11/2017   Procedure: ESOPHAGEAL MANOMETRY (EM);  Surgeon: Jordan Form, MD;  Location: [facility];  Service: Endoscopy;  Laterality: N/A;   ESOPHAGOGASTRODUODENOSCOPY  06/25/2011   Procedure: ESOPHAGOGASTRODUODENOSCOPY (EGD);  Surgeon: Jordan Carwin, MD;  Location: [facility];  Service: Endoscopy;  Laterality: N/A;  no Xray   EYE SURGERY     KYPHOPLASTY N/A 08/30/2014   Procedure: THORACIC TWELVE KYPHOPLASTY;  Surgeon: Jordan Kelly, MD   LAPAROSCOPY ABDOMEN DIAGNOSTIC     To R/O endometriosis   PH IMPEDANCE STUDY N/A 04/11/2017   Procedure: PH IMPEDANCE STUDY;  Surgeon: Jordan Form, MD;  Location: [facility];  Service: Endoscopy;  Laterality: N/A;   SAVORY DILATION  06/25/2011   Procedure: SAVORY DILATION;  Surgeon: Jordan Carwin, MD;  Location: [facility];  Service: Endoscopy;  Laterality: N/A;   TONSILLECTOMY AND ADENOIDECTOMY  02/23/1960   US ECHOCARDIOGRAPHY  10/24/2014   nl systolic function EF 55%, mild diastolic dysfunction    MEDICATIONS:  albuterol (VENTOLIN HFA) 108 (90 Base) MCG/ACT inhaler   amLODipine (NORVASC) 5 MG tablet   aspirin 81 MG tablet   Budeson-Glycopyrrol-Formoterol (BREZTRI AEROSPHERE) 160-9-4.8 MCG/ACT AERO   chlorpheniramine (CHLOR-TRIMETON) 4 MG tablet   chlorthalidone (HYGROTON) 25 MG tablet   Cholecalciferol (VITAMIN D3) 1000 units CAPS   diphenhydramine-acetaminophen (TYLENOL PM) 25-500 MG TABS tablet   esomeprazole (NEXIUM) 40 MG capsule   famotidine (PEPCID) 20 MG tablet   ferrous sulfate 325 (65 FE) MG EC tablet   Garlic (GARLIQUE PO)   levothyroxine (SYNTHROID) 100 MCG tablet   MAGNESIUM PO   metFORMIN (GLUCOPHAGE) 500 MG tablet   methocarbamol (ROBAXIN) 500 MG tablet   montelukast (SINGULAIR) 10 MG  tablet   Omega-3 Fatty Acids (OMEGA 3 500 PO)   polyethylene glycol (MIRALAX / GLYCOLAX) packet   Polyvinyl Alcohol-Povidone (REFRESH OP)   Specialty Vitamins Products (CARDIOVASCULAR SUPPORT PO)   valsartan (DIOVAN) 320 MG tablet   No current facility-administered medications for this encounter.    Shonna Chock, PA-C Surgical Short Stay/Anesthesiology [facility] Phone [phone] [facility] Phone [phone] 01/21/2021 11:53 AM

## 2021-01-22 ENCOUNTER — Ambulatory Visit (HOSPITAL_COMMUNITY): Payer: Medicare PPO | Admitting: Anesthesiology

## 2021-01-22 ENCOUNTER — Encounter (HOSPITAL_COMMUNITY): Payer: Self-pay | Admitting: Neurosurgery

## 2021-01-22 ENCOUNTER — Ambulatory Visit (HOSPITAL_COMMUNITY)
Admission: RE | Admit: 2021-01-22 | Discharge: 2021-01-23 | Disposition: A | Discharge status: Home / Self Care | Payer: Medicare PPO | Source: Ambulatory Visit | Attending: Neurosurgery | Admitting: Neurosurgery

## 2021-01-22 ENCOUNTER — Encounter (HOSPITAL_COMMUNITY)
Admission: RE | Disposition: A | Discharge status: Home / Self Care | Payer: Self-pay | Source: Ambulatory Visit | Attending: Neurosurgery

## 2021-01-22 ENCOUNTER — Ambulatory Visit (HOSPITAL_COMMUNITY): Payer: Medicare PPO | Admitting: Vascular Surgery

## 2021-01-22 ENCOUNTER — Ambulatory Visit (HOSPITAL_COMMUNITY): Payer: Medicare PPO

## 2021-01-22 DIAGNOSIS — Z419 Encounter for procedure for purposes other than remedying health state, unspecified: Secondary | ICD-10-CM

## 2021-01-22 DIAGNOSIS — E119 Type 2 diabetes mellitus without complications: Secondary | ICD-10-CM | POA: Insufficient documentation

## 2021-01-22 DIAGNOSIS — X58XXXA Exposure to other specified factors, initial encounter: Secondary | ICD-10-CM | POA: Insufficient documentation

## 2021-01-22 DIAGNOSIS — S22000A Wedge compression fracture of unspecified thoracic vertebra, initial encounter for closed fracture: Secondary | ICD-10-CM | POA: Diagnosis present

## 2021-01-22 DIAGNOSIS — R2681 Unsteadiness on feet: Secondary | ICD-10-CM | POA: Insufficient documentation

## 2021-01-22 DIAGNOSIS — S22060A Wedge compression fracture of T7-T8 vertebra, initial encounter for closed fracture: Secondary | ICD-10-CM | POA: Diagnosis not present

## 2021-01-22 DIAGNOSIS — S22070A Wedge compression fracture of T9-T10 vertebra, initial encounter for closed fracture: Secondary | ICD-10-CM | POA: Diagnosis not present

## 2021-01-22 DIAGNOSIS — M4854XA Collapsed vertebra, not elsewhere classified, thoracic region, initial encounter for fracture: Secondary | ICD-10-CM | POA: Diagnosis not present

## 2021-01-22 DIAGNOSIS — Z981 Arthrodesis status: Secondary | ICD-10-CM | POA: Diagnosis not present

## 2021-01-22 HISTORY — PX: KYPHOPLASTY: SHX5884

## 2021-01-22 LAB — GLUCOSE, CAPILLARY
Glucose-Capillary: 133 mg/dL — ABNORMAL HIGH (ref 70–99)
Glucose-Capillary: 144 mg/dL — ABNORMAL HIGH (ref 70–99)

## 2021-01-22 SURGERY — KYPHOPLASTY
Anesthesia: General | Site: Spine Thoracic

## 2021-01-22 MED ORDER — 0.9 % SODIUM CHLORIDE (POUR BTL) OPTIME
TOPICAL | Status: DC | PRN
  Administered 2021-01-22: 1000 mL

## 2021-01-22 MED ORDER — ACETAMINOPHEN 500 MG PO TABS: 1000.0000 mg | ORAL_TABLET | Freq: Once | ORAL | Status: DC

## 2021-01-22 MED ORDER — CEFAZOLIN SODIUM-DEXTROSE 2-3 GM-%(50ML) IV SOLR
INTRAVENOUS | Status: DC | PRN
  Administered 2021-01-22: 2 g via INTRAVENOUS

## 2021-01-22 MED ORDER — DEXAMETHASONE SODIUM PHOSPHATE 10 MG/ML IJ SOLN
INTRAMUSCULAR | Status: AC
  Filled 2021-01-22: qty 1

## 2021-01-22 MED ORDER — LIDOCAINE-EPINEPHRINE 1 %-1:100000 IJ SOLN
INTRAMUSCULAR | Status: AC
  Filled 2021-01-22: qty 1

## 2021-01-22 MED ORDER — PANTOPRAZOLE SODIUM 40 MG PO TBEC
80.0000 mg | DELAYED_RELEASE_TABLET | Freq: Every day | ORAL | Status: DC
  Administered 2021-01-23: 80 mg via ORAL
  Filled 2021-01-22: qty 2

## 2021-01-22 MED ORDER — ROCURONIUM BROMIDE 10 MG/ML (PF) SYRINGE
PREFILLED_SYRINGE | INTRAVENOUS | Status: AC
  Filled 2021-01-22: qty 10

## 2021-01-22 MED ORDER — DIPHENHYDRAMINE HCL 25 MG PO CAPS
25.0000 mg | ORAL_CAPSULE | Freq: Every day | ORAL | Status: DC
  Administered 2021-01-22: 25 mg via ORAL
  Filled 2021-01-22: qty 1

## 2021-01-22 MED ORDER — FAMOTIDINE 20 MG PO TABS
20.0000 mg | ORAL_TABLET | Freq: Every day | ORAL | Status: DC
  Administered 2021-01-22: 20 mg via ORAL
  Filled 2021-01-22: qty 1

## 2021-01-22 MED ORDER — SUGAMMADEX SODIUM 200 MG/2ML IV SOLN
INTRAVENOUS | Status: DC | PRN
  Administered 2021-01-22: 200 mg via INTRAVENOUS

## 2021-01-22 MED ORDER — ACETAMINOPHEN 500 MG PO TABS
500.0000 mg | ORAL_TABLET | Freq: Every day | ORAL | Status: DC
  Administered 2021-01-22: 500 mg via ORAL
  Filled 2021-01-22: qty 1

## 2021-01-22 MED ORDER — BUDESON-GLYCOPYRROL-FORMOTEROL 160-9-4.8 MCG/ACT IN AERO: 2.0000 | INHALATION_SPRAY | Freq: Two times a day (BID) | RESPIRATORY_TRACT | Status: DC

## 2021-01-22 MED ORDER — AMLODIPINE BESYLATE 5 MG PO TABS
5.0000 mg | ORAL_TABLET | Freq: Every day | ORAL | Status: DC
  Administered 2021-01-23: 5 mg via ORAL
  Filled 2021-01-22: qty 1

## 2021-01-22 MED ORDER — VITAMIN D 25 MCG (1000 UNIT) PO TABS
1000.0000 [IU] | ORAL_TABLET | Freq: Every day | ORAL | Status: DC
Start: 2021-01-23 — End: 2021-01-23
  Administered 2021-01-23: 1000 [IU] via ORAL
  Filled 2021-01-22: qty 1

## 2021-01-22 MED ORDER — METHOCARBAMOL 500 MG PO TABS
500.0000 mg | ORAL_TABLET | Freq: Three times a day (TID) | ORAL | Status: DC | PRN
  Administered 2021-01-22 – 2021-01-23 (×2): 500 mg via ORAL
  Filled 2021-01-22 (×2): qty 1

## 2021-01-22 MED ORDER — FENTANYL CITRATE (PF) 250 MCG/5ML IJ SOLN
INTRAMUSCULAR | Status: AC
  Filled 2021-01-22: qty 5

## 2021-01-22 MED ORDER — DIPHENHYDRAMINE-APAP (SLEEP) 25-500 MG PO TABS: 1.0000 | ORAL_TABLET | Freq: Every day | ORAL | Status: DC

## 2021-01-22 MED ORDER — ACETAMINOPHEN 500 MG PO TABS
1000.0000 mg | ORAL_TABLET | Freq: Once | ORAL | Status: AC
  Administered 2021-01-22: 1000 mg via ORAL
  Filled 2021-01-22: qty 2

## 2021-01-22 MED ORDER — IRBESARTAN 150 MG PO TABS
300.0000 mg | ORAL_TABLET | Freq: Every day | ORAL | Status: DC
  Administered 2021-01-23: 300 mg via ORAL
  Filled 2021-01-22: qty 2

## 2021-01-22 MED ORDER — PHENYLEPHRINE 40 MCG/ML (10ML) SYRINGE FOR IV PUSH (FOR BLOOD PRESSURE SUPPORT)
PREFILLED_SYRINGE | INTRAVENOUS | Status: AC
  Filled 2021-01-22: qty 10

## 2021-01-22 MED ORDER — UMECLIDINIUM BROMIDE 62.5 MCG/ACT IN AEPB
1.0000 | INHALATION_SPRAY | Freq: Every day | RESPIRATORY_TRACT | Status: DC
  Filled 2021-01-22: qty 7

## 2021-01-22 MED ORDER — ALBUTEROL SULFATE HFA 108 (90 BASE) MCG/ACT IN AERS: 2.0000 | INHALATION_SPRAY | Freq: Four times a day (QID) | RESPIRATORY_TRACT | Status: DC | PRN

## 2021-01-22 MED ORDER — IOHEXOL 300 MG/ML  SOLN
INTRAMUSCULAR | Status: DC | PRN
  Administered 2021-01-22: 50 mL

## 2021-01-22 MED ORDER — POLYVINYL ALCOHOL 1.4 % OP SOLN
1.0000 [drp] | Freq: Every day | OPHTHALMIC | Status: DC
  Filled 2021-01-22: qty 15

## 2021-01-22 MED ORDER — ASPIRIN EC 81 MG PO TBEC
81.0000 mg | DELAYED_RELEASE_TABLET | Freq: Every day | ORAL | Status: DC
  Administered 2021-01-23: 81 mg via ORAL
  Filled 2021-01-22: qty 1

## 2021-01-22 MED ORDER — LIDOCAINE 2% (20 MG/ML) 5 ML SYRINGE
INTRAMUSCULAR | Status: DC | PRN
  Administered 2021-01-22: 80 mg via INTRAVENOUS

## 2021-01-22 MED ORDER — FENTANYL CITRATE (PF) 250 MCG/5ML IJ SOLN
INTRAMUSCULAR | Status: DC | PRN
  Administered 2021-01-22: 50 ug via INTRAVENOUS

## 2021-01-22 MED ORDER — PROPOFOL 10 MG/ML IV BOLUS
INTRAVENOUS | Status: DC | PRN
  Administered 2021-01-22: 110 mg via INTRAVENOUS

## 2021-01-22 MED ORDER — POLYVINYL ALCOHOL-POVIDONE PF 1.4-0.6 % OP SOLN: 1.0000 [drp] | Freq: Every day | OPHTHALMIC | Status: DC

## 2021-01-22 MED ORDER — LIDOCAINE 2% (20 MG/ML) 5 ML SYRINGE
INTRAMUSCULAR | Status: AC
  Filled 2021-01-22: qty 5

## 2021-01-22 MED ORDER — CHLORHEXIDINE GLUCONATE 0.12 % MT SOLN
15.0000 mL | Freq: Once | OROMUCOSAL | Status: AC
  Administered 2021-01-22: 15 mL via OROMUCOSAL
  Filled 2021-01-22: qty 15

## 2021-01-22 MED ORDER — CHLORTHALIDONE 25 MG PO TABS
25.0000 mg | ORAL_TABLET | Freq: Every day | ORAL | Status: DC
  Administered 2021-01-23: 25 mg via ORAL
  Filled 2021-01-22: qty 1

## 2021-01-22 MED ORDER — LEVOTHYROXINE SODIUM 100 MCG PO TABS
100.0000 ug | ORAL_TABLET | Freq: Every day | ORAL | Status: DC
  Administered 2021-01-23: 100 ug via ORAL
  Filled 2021-01-22: qty 1

## 2021-01-22 MED ORDER — ROCURONIUM BROMIDE 10 MG/ML (PF) SYRINGE
PREFILLED_SYRINGE | INTRAVENOUS | Status: DC | PRN
  Administered 2021-01-22: 50 mg via INTRAVENOUS

## 2021-01-22 MED ORDER — FENTANYL CITRATE (PF) 100 MCG/2ML IJ SOLN: 25.0000 ug | INTRAMUSCULAR | Status: DC | PRN

## 2021-01-22 MED ORDER — FERROUS SULFATE 325 (65 FE) MG PO TABS
325.0000 mg | ORAL_TABLET | ORAL | Status: DC
  Administered 2021-01-23: 325 mg via ORAL
  Filled 2021-01-22: qty 1

## 2021-01-22 MED ORDER — ONDANSETRON HCL 4 MG/2ML IJ SOLN
INTRAMUSCULAR | Status: AC
  Filled 2021-01-22: qty 2

## 2021-01-22 MED ORDER — PROPOFOL 10 MG/ML IV BOLUS
INTRAVENOUS | Status: AC
  Filled 2021-01-22: qty 20

## 2021-01-22 MED ORDER — CEFAZOLIN SODIUM-DEXTROSE 2-4 GM/100ML-% IV SOLN
INTRAVENOUS | Status: AC
  Filled 2021-01-22: qty 100

## 2021-01-22 MED ORDER — ORAL CARE MOUTH RINSE: 15.0000 mL | Freq: Once | OROMUCOSAL | Status: AC

## 2021-01-22 MED ORDER — FLUTICASONE FUROATE-VILANTEROL 200-25 MCG/ACT IN AEPB
1.0000 | INHALATION_SPRAY | Freq: Every day | RESPIRATORY_TRACT | Status: DC
  Filled 2021-01-22: qty 28

## 2021-01-22 MED ORDER — LACTATED RINGERS IV SOLN: INTRAVENOUS | Status: DC

## 2021-01-22 MED ORDER — DEXAMETHASONE SODIUM PHOSPHATE 10 MG/ML IJ SOLN
INTRAMUSCULAR | Status: DC | PRN
  Administered 2021-01-22: 5 mg via INTRAVENOUS

## 2021-01-22 MED ORDER — MONTELUKAST SODIUM 10 MG PO TABS
10.0000 mg | ORAL_TABLET | Freq: Every day | ORAL | Status: DC
  Administered 2021-01-22: 10 mg via ORAL
  Filled 2021-01-22: qty 1

## 2021-01-22 MED ORDER — TRAMADOL HCL 50 MG PO TABS
50.0000 mg | ORAL_TABLET | Freq: Four times a day (QID) | ORAL | Status: DC | PRN
  Administered 2021-01-22 – 2021-01-23 (×2): 50 mg via ORAL
  Filled 2021-01-22 (×2): qty 1

## 2021-01-22 MED ORDER — POTASSIUM CHLORIDE IN NACL 20-0.9 MEQ/L-% IV SOLN: INTRAVENOUS | Status: DC

## 2021-01-22 MED ORDER — AMISULPRIDE (ANTIEMETIC) 5 MG/2ML IV SOLN: 10.0000 mg | Freq: Once | INTRAVENOUS | Status: DC | PRN

## 2021-01-22 MED ORDER — ONDANSETRON HCL 4 MG/2ML IJ SOLN
INTRAMUSCULAR | Status: DC | PRN
  Administered 2021-01-22: 4 mg via INTRAVENOUS

## 2021-01-22 MED ORDER — PHENYLEPHRINE 40 MCG/ML (10ML) SYRINGE FOR IV PUSH (FOR BLOOD PRESSURE SUPPORT)
PREFILLED_SYRINGE | INTRAVENOUS | Status: DC | PRN
  Administered 2021-01-22 (×7): 80 ug via INTRAVENOUS

## 2021-01-22 MED ORDER — METFORMIN HCL 500 MG PO TABS
500.0000 mg | ORAL_TABLET | Freq: Two times a day (BID) | ORAL | Status: DC
  Administered 2021-01-23: 500 mg via ORAL
  Filled 2021-01-22: qty 1

## 2021-01-22 MED ORDER — DIPHENHYDRAMINE HCL 25 MG PO TABS: 25.0000 mg | ORAL_TABLET | Freq: Four times a day (QID) | ORAL | Status: DC

## 2021-01-22 MED ORDER — VITAMIN D3 25 MCG (1000 UT) PO CAPS: 1.0000 | ORAL_CAPSULE | Freq: Every day | ORAL | Status: DC

## 2021-01-22 SURGICAL SUPPLY — 46 items
ADH SKN CLS APL DERMABOND .7 (GAUZE/BANDAGES/DRESSINGS) ×1
BAG COUNTER SPONGE SURGICOUNT (BAG) ×2 IMPLANT
BAG SPNG CNTER NS LX DISP (BAG) ×1
BAG SURGICOUNT SPONGE COUNTING (BAG) ×1
BLADE CLIPPER SURG (BLADE) IMPLANT
BLADE SURG 15 STRL LF DISP TIS (BLADE) ×1 IMPLANT
BLADE SURG 15 STRL SS (BLADE) ×3
CARTRIDGE OIL MAESTRO DRILL (MISCELLANEOUS) ×1 IMPLANT
CEMENT KYPHON C01A KIT/MIXER (Cement) ×2 IMPLANT
CEMENT KYPHON CX01A KIT/MIXER (Cement) ×2 IMPLANT
CURETTE EXPRESS SZ2 7MM (INSTRUMENTS) IMPLANT
CURRETTE EXPRESS SZ2 7MM (INSTRUMENTS) ×6
DERMABOND ADVANCED (GAUZE/BANDAGES/DRESSINGS) ×2
DERMABOND ADVANCED .7 DNX12 (GAUZE/BANDAGES/DRESSINGS) ×1 IMPLANT
DIFFUSER DRILL AIR PNEUMATIC (MISCELLANEOUS) ×1 IMPLANT
DRAPE C-ARM 42X72 X-RAY (DRAPES) ×3 IMPLANT
DRAPE HALF SHEET 40X57 (DRAPES) ×3 IMPLANT
DRAPE LAPAROTOMY 100X72X124 (DRAPES) ×3 IMPLANT
DRAPE SURG 17X23 STRL (DRAPES) ×3 IMPLANT
DRAPE WARM FLUID 44X44 (DRAPES) ×3 IMPLANT
DURAPREP 26ML APPLICATOR (WOUND CARE) ×3 IMPLANT
GAUZE 4X4 16PLY ~~LOC~~+RFID DBL (SPONGE) ×3 IMPLANT
GLOVE EXAM NITRILE XL STR (GLOVE) IMPLANT
GLOVE SURG LTX SZ6.5 (GLOVE) ×3 IMPLANT
GLOVE SURG LTX SZ7 (GLOVE) ×2 IMPLANT
GLOVE SURG UNDER POLY LF SZ7.5 (GLOVE) ×2 IMPLANT
GOWN STRL REUS W/ TWL LRG LVL3 (GOWN DISPOSABLE) ×2 IMPLANT
GOWN STRL REUS W/ TWL XL LVL3 (GOWN DISPOSABLE) IMPLANT
GOWN STRL REUS W/TWL 2XL LVL3 (GOWN DISPOSABLE) IMPLANT
GOWN STRL REUS W/TWL LRG LVL3 (GOWN DISPOSABLE) ×9
GOWN STRL REUS W/TWL XL LVL3 (GOWN DISPOSABLE)
KIT BASIN OR (CUSTOM PROCEDURE TRAY) ×3 IMPLANT
KIT TURNOVER KIT B (KITS) ×3 IMPLANT
NDL HYPO 25X1 1.5 SAFETY (NEEDLE) ×1 IMPLANT
NEEDLE HYPO 25X1 1.5 SAFETY (NEEDLE) ×3 IMPLANT
NS IRRIG 1000ML POUR BTL (IV SOLUTION) ×3 IMPLANT
OIL CARTRIDGE MAESTRO DRILL (MISCELLANEOUS)
PACK SURGICAL SETUP 50X90 (CUSTOM PROCEDURE TRAY) ×3 IMPLANT
PAD ARMBOARD 7.5X6 YLW CONV (MISCELLANEOUS) ×9 IMPLANT
SPECIMEN JAR SMALL (MISCELLANEOUS) IMPLANT
STAPLER SKIN PROX WIDE 3.9 (STAPLE) ×3 IMPLANT
SUT VIC AB 3-0 SH 8-18 (SUTURE) ×3 IMPLANT
SYR CONTROL 10ML LL (SYRINGE) ×6 IMPLANT
TOWEL GREEN STERILE (TOWEL DISPOSABLE) ×3 IMPLANT
TOWEL GREEN STERILE FF (TOWEL DISPOSABLE) ×3 IMPLANT
TRAY KYPHOPAK 15/2 EXPRESS (KITS) ×2 IMPLANT

## 2021-01-22 NOTE — Anesthesia Postprocedure Evaluation (Signed)
Anesthesia Post Note  Patient: Jordan Patterson  Procedure(s) Performed: Thoracic eight, Thoracic nine KYPHOPLASTY (Spine Thoracic)     Patient location during evaluation: PACU Anesthesia Type: General Level of consciousness: sedated and patient cooperative Pain management: pain level controlled Vital Signs Assessment: post-procedure vital signs reviewed and stable Respiratory status: spontaneous breathing Cardiovascular status: stable Anesthetic complications: no   No notable events documented.  Last Vitals:  Vitals:   01/22/21 2115 01/22/21 2133  BP: (!) 158/87 (!) 166/88  Pulse: 95 95  Resp: 20 16  Temp: 36.6 C 36.5 C  SpO2: 94% 94%    Last Pain:  Vitals:   01/22/21 2216  TempSrc:   PainSc: 4                  Lewie Loron

## 2021-01-22 NOTE — Op Note (Signed)
01/22/2021  8:53 PM  PATIENT:  Jordan Patterson  80 y.o. female With back pain and thoracic compression fractures PRE-OPERATIVE DIAGNOSIS:  Compression fracture of t9 , T8vertebra  POST-OPERATIVE DIAGNOSIS:  Compression fracture of t9 ,T8 vertebra  PROCEDURE:  Procedure(s): Thoracic eight, Thoracic nine KYPHOPLASTY  SURGEON:  Surgeon(s): Ashok Pall, MD  ANESTHESIA:   general  EBL:  Total I/O In: 700 [I.V.:700] Out: -   BLOOD ADMINISTERED:none  COUNT:per nursing  SPECIMEN:  No Specimen  DICTATION: Mrs. Schatzman was taken to the operating room, intubated and placed under a general anesthetic without difficulty. She was positioned prone on the operating room table with all pressure points properly padded. Her back was prepped and draped in a sterile manner. With fluoroscopy I localized the T8, and 9 pedicles bilaterally. I injected lidocaine into the entry sites on both the left and right sides. I started by making a stab incision on the T8 side and entering the T8 pedicles with fluoroscopic guidance. Once good position was obtained, I drilled into the vertebral body. I then placed the kyphoplasty balloon into the T8 vertebra and inflated the balloon. The balloon met with great resistance at T8 on both sides but a small cavity was created.I then inserted approximately 4.5cc of methylmethacrylate into the vertebral body under fluoroscopic guidance. I achieved a good fill of the cavity, staying within the confines of the vertebral body. At T9 the balloon would not inflate on either side. I nevertheless placed methylmethacrylate into the T9 vertebral body and achieved a very good fill via the interstitial spaces in the bone. I removed the instrumentation from the vertebral body, and the final films looked good. I closed the stab incision with vicryl suture and used Dermabond for a sterile dressing. Marland Kitchen    PLAN OF CARE: Admit to inpatient   PATIENT DISPOSITION:  PACU - hemodynamically stable.    Delay start of Pharmacological VTE agent (>24hrs) due to surgical blood loss or risk of bleeding:  yes

## 2021-01-22 NOTE — Transfer of Care (Signed)
Immediate Anesthesia Transfer of Care Note  Patient: Jordan Patterson  Procedure(s) Performed: Thoracic eight, Thoracic nine KYPHOPLASTY (Spine Thoracic)  Patient Location: PACU  Anesthesia Type:General  Level of Consciousness: awake, alert  and oriented  Airway & Oxygen Therapy: Patient Spontanous Breathing and Patient connected to face mask oxygen  Post-op Assessment: Report given to RN and Post -op Vital signs reviewed and stable  Post vital signs: Reviewed and stable  Last Vitals:  Vitals Value Taken Time  BP 129/86 01/22/21 2002  Temp    Pulse 86 01/22/21 2003  Resp 9 01/22/21 2003  SpO2 100 % 01/22/21 2003  Vitals shown include unvalidated device data.  Last Pain:  Vitals:   01/22/21 1421  TempSrc: Oral  PainSc:       Patients Stated Pain Goal: 0 (01/22/21 1420)  Complications: No notable events documented.

## 2021-01-22 NOTE — Anesthesia Procedure Notes (Signed)
Procedure Name: Intubation Date/Time: 01/22/2021 5:48 PM Performed by: Reece Agar, CRNA Pre-anesthesia Checklist: Patient identified, Emergency Drugs available, Suction available and Patient being monitored Patient Re-evaluated:Patient Re-evaluated prior to induction Oxygen Delivery Method: Circle System Utilized Preoxygenation: Pre-oxygenation with 100% oxygen Induction Type: IV induction Ventilation: Mask ventilation without difficulty Laryngoscope Size: Mac and 3 Grade View: Grade I Tube type: Oral Tube size: 7.0 mm Number of attempts: 1 Airway Equipment and Method: Stylet Placement Confirmation: ETT inserted through vocal cords under direct vision, positive ETCO2 and breath sounds checked- equal and bilateral Secured at: 21 cm Tube secured with: Tape Dental Injury: Teeth and Oropharynx as per pre-operative assessment

## 2021-01-22 NOTE — H&P (Signed)
BP (!) 167/91   Pulse 97   Temp 98.8 F (37.1 C) (Oral)   Resp 18   Ht 5' 7.5" (1.715 m)   Wt 77.9 kg   BMI 26.51 kg/m  Jordan Patterson is a former patient of Dr. Newell Coral who did a T12 kyphoplasty about 6 years ago.  Where the kyphoplasty was done, at T11-T12, is unchanged.  He has fused around the ball of methylmethacrylate.  She does have a new compression fracture at T8 and at T9, and I think this where the actual pain is coming from.       She weighs 170 pounds. Temperature is 97, blood pressure 161/82, pulse 97.  Pain is 9/10.       She is currently retired, right-handed, 80 years of age.  No history of substance abuse.  She does not use alcohol.  Her primary care physician is Dr. Roxy Manns.     Past medical history significant for hypertension, lung disease, asthma, diabetes, and gastrointestinal problems.  She has undergone cataract surgery on 2 occasions.  She takes Chlorthalidone, Amlodipine, Valsartan, Metformin and Risek.     FAMILY HISTORY :  Mother died age 66.  She had a stroke.  Father died age 38 after open-heart surgery.  In her words, she has severe back pain from a fall.  The fall occurred on October 25, 2020.  If she lays flat on her back, it will relieve the pain.  Pain is 9/10.     REVIEW OF SYSTEMS :  Positive for hearing loss, anosmia, chronic cough, hypercholesterolemia, arthritis, back pain, thyroid disease.     PHYSICAL EXAM :  She is alert, oriented by 4. Answers all questions appropriately. 5/5 strength in the lower extremities.  Normal strength in the upper extremities.  Gait is normal.  Romberg is negative.  Muscle tone, bulk, coordination are normal.  Pupils equal, round, react to light.  Full extraocular movements.  Full visual fields.  Symmetric facial movements.  Tongue and uvula in midline.  Jordan Patterson on MRI, has an acute compression fracture at T8, which is severe, and an acute compression fracture T9, which is not so severe.  Where she  has had the previous kypho is absolutely fine and has healed.       The daughter's that she was accompanied by suggested that she might need clearance from a pulmonary factor.  I will go ahead and send her to Dr. Fannie Knee at [facility] for pulmonary clearance.  If she does not need one, I am sure he will be able to simply say she does not need it and we can proceed.  Having undergone the surgery previously, she is quite familiar.

## 2021-01-23 ENCOUNTER — Encounter (HOSPITAL_COMMUNITY): Payer: Self-pay | Admitting: Neurosurgery

## 2021-01-23 DIAGNOSIS — M4854XA Collapsed vertebra, not elsewhere classified, thoracic region, initial encounter for fracture: Secondary | ICD-10-CM | POA: Diagnosis not present

## 2021-01-23 DIAGNOSIS — R2681 Unsteadiness on feet: Secondary | ICD-10-CM | POA: Diagnosis not present

## 2021-01-23 DIAGNOSIS — E119 Type 2 diabetes mellitus without complications: Secondary | ICD-10-CM | POA: Diagnosis not present

## 2021-01-23 LAB — GLUCOSE, CAPILLARY: Glucose-Capillary: 158 mg/dL — ABNORMAL HIGH (ref 70–99)

## 2021-01-23 NOTE — Progress Notes (Signed)
Patient alert and oriented, mae's well, voiding adequate amount of urine, swallowing without difficulty, no c/o pain at time of discharge. Patient discharged home with family. Script and discharged instructions given to patient. Patient and family stated understanding of instructions given. Patient has an appointment with Dr. Cabbell   

## 2021-01-23 NOTE — Discharge Instructions (Addendum)
  Wound Care Leave incision open to air. You may shower. Do not scrub directly on incision.  Do not put any creams, lotions, or ointments on incision. Activity Walk each and every day, increasing distance each day. No lifting greater than 5 lbs.  Avoid bending, arching, and twisting. No driving for 2 weeks; may ride as a passenger locally.  Diet Resume your normal diet.   Call Your Doctor If Any of These Occur Redness, drainage, or swelling at the wound.  Temperature greater than 101 degrees. Severe pain not relieved by pain medication. Incision starts to come apart. Follow Up Appt Call today for appointment in 3 weeks ([phone]) or for problems.            Care After These instructions give you information on caring for yourself after your procedure. Your doctor may also give you more specific instructions. Call your doctor if you have any problems or questions after your procedure. HOME CARE Take medicine as told by your doctor. Keep your wound covered for 24 hours or as told by your doctor. Ask your doctor when you can bathe or shower. Put ice on your wound if it helps your pain. Put ice in a plastic bag. Place a towel between your skin and the bag. Leave the ice on for 15 to 20 minutes, 3 to 4 times a day.  Return to normal activities as told by your doctor. Ask your doctor what stretches and exercises you can do. Do not bend or lift anything heavy as told by your doctor. GET HELP RIGHT AWAY IF:  You have bad back pain that comes on suddenly. You cannot control when you pee (urinate) or poop (bowel movement). You lose feeling (numbness) in your legs or feet, or they become weak. You have shooting pain down your legs. You have a fever. Your wound becomes red, puffy (swollen), or tender to the touch. You are bleeding or leaking fluid from the wound. You are sick to your stomach (nauseous) or throw up (vomit) for more than 24 hours after the procedure. Your back  pain does not get better. MAKE SURE YOU: Understand these instructions. Will watch your condition. Will get help right away if you are not doing well or get worse. Document Released: 05/05/2009 Document Revised: 05/03/2011 Document Reviewed: 07/15/2010 [facility] Patient Information 2013 [place], [place].

## 2021-01-23 NOTE — Evaluation (Signed)
Occupational Therapy Evaluation Patient Details Name: Jordan Patterson MRN: [id number] DOB: March 11, 1940 Today's Date: 01/23/2021   History of Present Illness 80 y.o. female presents to [facility] hospital on 01/22/2021, found to have compression fx at T8 and T9. Pt underwent kyphoplasty of T8 and T9 on 01/22/2021. PMH includes GERD, HTN, HLD, depression, DMII, OA, asthma.   Clinical Impression   Patient evaluated by Occupational Therapy with no further acute OT needs identified. All education has been completed and the patient has no further questions. See below for any follow-up Occupational Therapy or equipment needs. OT to sign off. Thank you for referral.        Recommendations for follow up therapy are one component of a multi-disciplinary discharge planning process, led by the attending physician.  Recommendations may be updated based on patient status, additional functional criteria and insurance authorization.   Follow Up Recommendations  No OT follow up    Assistance Recommended at Discharge None  Functional Status Assessment  Patient has had a recent decline in their functional status and demonstrates the ability to make significant improvements in function in a reasonable and predictable amount of time.  Equipment Recommendations       Recommendations for Other Services       Precautions / Restrictions Precautions Precautions: Back Precaution Booklet Issued: Yes (comment) Precaution Comments: no brace Required Braces or Orthoses:  (no brace per orders) Restrictions Weight Bearing Restrictions: No      Mobility Bed Mobility Overal bed mobility: Needs Assistance Bed Mobility: Rolling;Sidelying to Sit Rolling: Supervision Sidelying to sit: Supervision            Transfers Overall transfer level: Needs assistance Equipment used: None Transfers: Sit to/from Stand Sit to Stand: Supervision           General transfer comment: mild posterior lean after initial stand  without UE support      Balance Overall balance assessment: Needs assistance Sitting-balance support: No upper extremity supported;Feet supported Sitting balance-Leahy Scale: Good     Standing balance support: No upper extremity supported;During functional activity Standing balance-Leahy Scale: Poor Standing balance comment: benefits from UE support of railing or cane                           ADL either performed or assessed with clinical judgement   ADL Overall ADL's : Modified independent                                             Vision Baseline Vision/History: 1 Wears glasses       Perception     Praxis      Pertinent Vitals/Pain Pain Assessment: No/denies pain     Hand Dominance Right   Extremity/Trunk Assessment Upper Extremity Assessment Upper Extremity Assessment: Overall WFL for tasks assessed   Lower Extremity Assessment Lower Extremity Assessment: Defer to PT evaluation   Cervical / Trunk Assessment Cervical / Trunk Assessment: Kyphotic   Communication Communication Communication: No difficulties   Cognition Arousal/Alertness: Awake/alert Behavior During Therapy: WFL for tasks assessed/performed Overall Cognitive Status: Within Functional Limits for tasks assessed                                       General Comments  VSS    Exercises     Shoulder Instructions      Home Living Family/patient expects to be discharged to:: Private residence Living Arrangements: Alone Available Help at Discharge: Family;Available 24 hours/day Type of Home: House Home Access: Stairs to enter [employer] of Steps: 1 Entrance Stairs-Rails: None Home Layout: Two level;Able to live on main level with bedroom/bathroom     Bathroom Shower/Tub: Producer, television/film/video: Handicapped height     Home Equipment: Cane - single point;Grab bars - tub/shower;Shower seat - built in           Prior Functioning/Environment Prior Level of Function : Independent/Modified Independent;History of Falls (last six months)             Mobility Comments: pt reports ambulating with support of furniture in the home          OT Problem List:        OT Treatment/Interventions:      OT Goals(Current goals can be found in the care plan section) Acute Rehab OT Goals Patient Stated Goal: to go home  OT Frequency:     Barriers to D/C:            Co-evaluation              AM-PAC OT "6 Clicks" Daily Activity     Outcome Measure Help from another person eating meals?: None Help from another person taking care of personal grooming?: None Help from another person toileting, which includes using toliet, bedpan, or urinal?: None Help from another person bathing (including washing, rinsing, drying)?: None Help from another person to put on and taking off regular upper body clothing?: None Help from another person to put on and taking off regular lower body clothing?: None 6 Click Score: 24   End of Session Equipment Utilized During Treatment: Rolling walker (2 wheels) Nurse Communication: Mobility status;Precautions  Activity Tolerance: Patient tolerated treatment well Patient left: in bed;with call bell/phone within reach  OT Visit Diagnosis: Unsteadiness on feet (R26.81)                Time: 0943-1000 OT Time Calculation (min): 17 min Charges:  OT General Charges $OT Visit: 1 Visit OT Evaluation $OT Eval Moderate Complexity: 1 Mod   Brynn, OTR/L  Acute Rehabilitation Services Pager: [phone] Office: [phone] .   Mateo Flow 01/23/2021, 12:09 PM

## 2021-01-23 NOTE — Evaluation (Signed)
Physical Therapy Evaluation Patient Details Name: Jordan Patterson MRN: [id number] DOB: 24-Oct-1940 Today's Date: 01/23/2021  History of Present Illness  80 y.o. female presents to [facility] hospital on 01/22/2021, found to have compression fx at T8 and T9. Pt underwent kyphoplasty of T8 and T9 on 01/22/2021. PMH includes GERD, HTN, HLD, depression, DMII, OA, asthma.  Clinical Impression  Pt presents to PT with deficits in gait, balance, power, and endurance, however these appear to be more chronic in nature as the pt reports not being far from her baseline. Pt is able to perform all mobility required in the home at a supervision level currently. Pt benefits from UE support of cane to improve balance and reduce falls risk. PT recommends return home with use of cane for all out of bed mobility.     Recommendations for follow up therapy are one component of a multi-disciplinary discharge planning process, led by the attending physician.  Recommendations may be updated based on patient status, additional functional criteria and insurance authorization.  Follow Up Recommendations No PT follow up    Assistance Recommended at Discharge Intermittent Supervision/Assistance  Functional Status Assessment Patient has not had a recent decline in their functional status  Equipment Recommendations  None recommended by PT (PT recommends use of cane for ambulation upon return home)    Recommendations for Other Services       Precautions / Restrictions Precautions Precautions: Back Precaution Booklet Issued: Yes (comment) Required Braces or Orthoses:  (no brace per orders) Restrictions Weight Bearing Restrictions: No      Mobility  Bed Mobility Overal bed mobility: Needs Assistance Bed Mobility: Rolling;Sidelying to Sit Rolling: Supervision Sidelying to sit: Supervision            Transfers Overall transfer level: Needs assistance Equipment used: None Transfers: Sit to/from Stand Sit to Stand:  Supervision           General transfer comment: mild posterior lean after initial stand without UE support    Ambulation/Gait Ambulation/Gait assistance: Supervision Gait Distance (Feet): 250 Feet (initial 25' without device, pt holding railing when available, remaining distance with [facility]) Assistive device: Straight cane Gait Pattern/deviations: Step-to pattern Gait velocity: reduced Gait velocity interpretation: <1.8 ft/sec, indicate of risk for recurrent falls   General Gait Details: pt with slowed step-to gait, reduced foot clearance bilaterally. Pt with increased sway without device, searching for railing or UE support. Pt ith improved stability with use of cane for UE support.  Stairs Stairs:  (pt declines stair training)          Wheelchair Mobility    Modified Rankin (Stroke Patients Only)       Balance Overall balance assessment: Needs assistance Sitting-balance support: No upper extremity supported;Feet supported Sitting balance-Leahy Scale: Good     Standing balance support: No upper extremity supported;During functional activity Standing balance-Leahy Scale: Poor Standing balance comment: benefits from UE support of railing or cane                             Pertinent Vitals/Pain Pain Assessment: No/denies pain    Home Living Family/patient expects to be discharged to:: Private residence Living Arrangements: Alone Available Help at Discharge: Family;Available 24 hours/day (24/7 initially from daughters) Type of Home: House Home Access: Stairs to enter Entrance Stairs-Rails: None Entrance Stairs-Number of Steps: 1   Home Layout: Two level;Able to live on main level with bedroom/bathroom Home Equipment: Cane - single point;Grab bars -  tub/shower      Prior Function Prior Level of Function : Independent/Modified Independent;History of Falls (last six months) (fall in September)             Mobility Comments: pt reports ambulating  with support of furniture in the home       Hand Dominance   Dominant Hand: Right    Extremity/Trunk Assessment   Upper Extremity Assessment Upper Extremity Assessment: Overall WFL for tasks assessed    Lower Extremity Assessment Lower Extremity Assessment: Generalized weakness    Cervical / Trunk Assessment Cervical / Trunk Assessment: Kyphotic  Communication   Communication: No difficulties  Cognition Arousal/Alertness: Awake/alert Behavior During Therapy: WFL for tasks assessed/performed Overall Cognitive Status: Within Functional Limits for tasks assessed                                          General Comments General comments (skin integrity, edema, etc.): VSS on RA    Exercises     Assessment/Plan    PT Assessment Patient needs continued PT services  PT Problem List Decreased strength;Decreased activity tolerance;Decreased balance;Decreased mobility;Decreased knowledge of use of DME;Decreased safety awareness       PT Treatment Interventions DME instruction;Gait training;Stair training;Functional mobility training;Therapeutic activities;Therapeutic exercise;Balance training;Patient/family education;Neuromuscular re-education    PT Goals (Current goals can be found in the Care Plan section)  Acute Rehab PT Goals Patient Stated Goal: to go home PT Goal Formulation: With patient Time For Goal Achievement: 01/28/21 Potential to Achieve Goals: Good    Frequency Min 5X/week   Barriers to discharge        Co-evaluation               AM-PAC PT "6 Clicks" Mobility  Outcome Measure Help needed turning from your back to your side while in a flat bed without using bedrails?: A Little Help needed moving from lying on your back to sitting on the side of a flat bed without using bedrails?: A Little Help needed moving to and from a bed to a chair (including a wheelchair)?: A Little Help needed standing up from a chair using your arms  (e.g., wheelchair or bedside chair)?: A Little Help needed to walk in hospital room?: A Little Help needed climbing 3-5 steps with a railing? : A Little 6 Click Score: 18    End of Session   Activity Tolerance: Patient tolerated treatment well Patient left: in bed;with call bell/phone within reach Nurse Communication: Mobility status PT Visit Diagnosis: Unsteadiness on feet (R26.81);History of falling (Z91.81)    Time: 0911-0930 PT Time Calculation (min) (ACUTE ONLY): 19 min   Charges:   PT Evaluation $PT Eval Low Complexity: 1 Low          Arlyss Gandy, PT, DPT Acute Rehabilitation Pager: [phone] Office [phone]   Arlyss Gandy 01/23/2021, 10:17 AM

## 2021-01-23 NOTE — Plan of Care (Signed)
Adequately Ready for Discharge 

## 2021-01-23 NOTE — Discharge Summary (Signed)
Physician Discharge Summary  Patient ID: Jordan Patterson MRN: [id number] DOB/AGE: 04-25-1940 80 y.o.  Admit date: 01/22/2021 Discharge date: 01/23/2021  Admission Diagnoses:thoracic 8,9 compression fractures  Discharge Diagnoses:  Principal Problem:   Thoracic compression fracture [facility])   Discharged Condition: good  Hospital Course: Mrs. Leisure was admitted and taken to the hospital and underwent a Kyphoplasty at T8, and T9. Post op she is ambulating, voiding, and tolerating a regular diet. Her wounds are clean, dry, and without signs of infection  Treatments: surgery: Kyphoplasty T8, 9  Discharge Exam: Blood pressure 133/76, pulse 95, temperature 97.8 F (36.6 C), temperature source Oral, resp. rate 16, height 5' 7.5" (1.715 m), weight 77.9 kg, SpO2 97 %. General appearance: alert, cooperative, appears stated age, and no distress  Disposition: Discharge disposition: 01-Home or Self Care      Compression fracture of t9 vertebra  Allergies as of 01/23/2021       Reactions   Ace Inhibitors Cough   Crestor [rosuvastatin] Other (See Comments)   myalgias   Pregabalin Other (See Comments)   Unknown reaction   Tegretol [carbamazepine] Other (See Comments)   Dizziness, headache   Wellbutrin [bupropion Hcl] Other (See Comments)   Unknown reaction   Cymbalta [duloxetine Hcl] Palpitations, Other (See Comments)   headaches   Lipitor [atorvastatin] Cough        Medication List     TAKE these medications    albuterol 108 (90 Base) MCG/ACT inhaler Commonly known as: VENTOLIN HFA TAKE 2 PUFFS BY MOUTH EVERY 6 HOURS AS NEEDED FOR WHEEZE OR SHORTNESS OF BREATH   amLODipine 5 MG tablet Commonly known as: NORVASC Take 1 tablet (5 mg total) by mouth daily.   aspirin 81 MG tablet Take 81 mg by mouth daily.   Breztri Aerosphere 160-9-4.8 MCG/ACT Aero Generic drug: Budeson-Glycopyrrol-Formoterol Inhale 2 puffs into the lungs 2 (two) times daily.   CARDIOVASCULAR SUPPORT  PO Take 2 capsules by mouth daily.   chlorpheniramine 4 MG tablet Commonly known as: CHLOR-TRIMETON Take 4 mg by mouth in the morning, at noon, and at bedtime.   chlorthalidone 25 MG tablet Commonly known as: HYGROTON Take 1 tablet (25 mg total) by mouth daily.   diphenhydramine-acetaminophen 25-500 MG Tabs tablet Commonly known as: TYLENOL PM Take 1 tablet by mouth at bedtime.   esomeprazole 40 MG capsule Commonly known as: NEXIUM TAKE 1 CAPSULE BY MOUTH EVERY DAY BEFORE BREAKFAST   famotidine 20 MG tablet Commonly known as: PEPCID TAKE 1 TABLET BY MOUTH EVERYDAY AT BEDTIME   ferrous sulfate 325 (65 FE) MG EC tablet Take 325 mg by mouth every Monday, Wednesday, and Friday.   GARLIQUE PO Take 1 tablet by mouth daily.   levothyroxine 100 MCG tablet Commonly known as: SYNTHROID Take 1 tablet (100 mcg total) by mouth daily before breakfast.   MAGNESIUM PO Take 2 tablets by mouth daily.   metFORMIN 500 MG tablet Commonly known as: GLUCOPHAGE TAKE 1 TABLET BY MOUTH 2 TIMES DAILY WITH A MEAL.   methocarbamol 500 MG tablet Commonly known as: Robaxin Take 1 tablet (500 mg total) by mouth every 8 (eight) hours as needed for muscle spasms.   montelukast 10 MG tablet Commonly known as: SINGULAIR TAKE 1 TABLET BY MOUTH EVERYDAY AT BEDTIME   OMEGA 3 500 PO Take 1 capsule by mouth in the morning and at bedtime.   polyethylene glycol 17 g packet Commonly known as: MIRALAX / GLYCOLAX Take 17 g by mouth 2 (two) times a  week.   REFRESH OP Place 1 drop into both eyes daily.   valsartan 320 MG tablet Commonly known as: DIOVAN TAKE 1 TABLET (320 MG TOTAL) BY MOUTH DAILY. PLEASE KEEP UPCOMING APPT FOR FUTURE REFILLS.   Vitamin D3 25 MCG (1000 UT) Caps Take 1 capsule (1,000 Units total) by mouth daily.        Follow-up Information     Coletta Memos, MD Follow up in 3 week(s).   Specialty: Neurosurgery Why: please call the office to make an appointment Contact  information: [address] [place] [address] [phone]                 Signed: Coletta Memos 01/23/2021, 12:12 PM

## 2021-01-27 DIAGNOSIS — S233XXA Sprain of ligaments of thoracic spine, initial encounter: Secondary | ICD-10-CM | POA: Diagnosis not present

## 2021-01-27 DIAGNOSIS — M9902 Segmental and somatic dysfunction of thoracic region: Secondary | ICD-10-CM | POA: Diagnosis not present

## 2021-01-29 ENCOUNTER — Other Ambulatory Visit: Payer: Self-pay | Admitting: Internal Medicine

## 2021-02-04 DIAGNOSIS — M9902 Segmental and somatic dysfunction of thoracic region: Secondary | ICD-10-CM | POA: Diagnosis not present

## 2021-02-04 DIAGNOSIS — S233XXA Sprain of ligaments of thoracic spine, initial encounter: Secondary | ICD-10-CM | POA: Diagnosis not present

## 2021-02-10 DIAGNOSIS — S233XXA Sprain of ligaments of thoracic spine, initial encounter: Secondary | ICD-10-CM | POA: Diagnosis not present

## 2021-02-10 DIAGNOSIS — M9902 Segmental and somatic dysfunction of thoracic region: Secondary | ICD-10-CM | POA: Diagnosis not present

## 2021-02-12 DIAGNOSIS — I10 Essential (primary) hypertension: Secondary | ICD-10-CM | POA: Diagnosis not present

## 2021-02-12 DIAGNOSIS — Z6825 Body mass index (BMI) 25.0-25.9, adult: Secondary | ICD-10-CM | POA: Diagnosis not present

## 2021-02-12 DIAGNOSIS — S22060A Wedge compression fracture of T7-T8 vertebra, initial encounter for closed fracture: Secondary | ICD-10-CM | POA: Diagnosis not present

## 2021-03-01 ENCOUNTER — Other Ambulatory Visit: Payer: Self-pay | Admitting: Internal Medicine

## 2021-03-02 ENCOUNTER — Telehealth: Payer: Self-pay | Admitting: Internal Medicine

## 2021-03-03 DIAGNOSIS — M9902 Segmental and somatic dysfunction of thoracic region: Secondary | ICD-10-CM | POA: Diagnosis not present

## 2021-03-03 DIAGNOSIS — S233XXA Sprain of ligaments of thoracic spine, initial encounter: Secondary | ICD-10-CM | POA: Diagnosis not present

## 2021-03-03 NOTE — Telephone Encounter (Signed)
I called the patient and she was going to pick up the prescription but she had not picked up yet.   Closing encounter.

## 2021-03-08 ENCOUNTER — Other Ambulatory Visit: Payer: Self-pay | Admitting: Family Medicine

## 2021-03-09 NOTE — Telephone Encounter (Signed)
LMTCB to schedule an appt.

## 2021-03-11 NOTE — Progress Notes (Signed)
Subjective:   Jordan Patterson is a 81 y.o. female who presents for Medicare Annual (Subsequent) preventive examination.  I connected with Jordan Patterson today by telephone and verified that I am speaking with the correct person using two identifiers. Location patient: home Location provider: work Persons participating in the virtual visit: patient, Engineer, civil (consulting).    I discussed the limitations, risks, security and privacy concerns of performing an evaluation and management service by telephone and the availability of in person appointments. I also discussed with the patient that there may be a patient responsible charge related to this service. The patient expressed understanding and verbally consented to this telephonic visit.    Interactive audio and video telecommunications were attempted between this provider and patient, however failed, due to patient having technical difficulties OR patient did not have access to video capability.  We continued and completed visit with audio only.  Some vital signs may be absent or patient reported.   Time Spent with patient on telephone encounter: 25 minutes  Review of Systems     Cardiac Risk Factors include: advanced age (>66men, >25 women);diabetes mellitus;hypertension;dyslipidemia     Objective:    Today's Vitals   03/13/21 1116  Weight: 171 lb (77.6 kg)  Height: 5\' 7"  (1.702 m)   Body mass index is 26.78 kg/m.  Advanced Directives 03/13/2021 01/22/2021 01/20/2021 03/12/2020 03/05/2019 02/27/2018 02/09/2017  Does Patient Have a Medical Advance Directive? Yes Yes Yes Yes Yes Yes Yes  Type of 02/11/2017 of [place];Living will Healthcare Power of [place] Power of [employer] Power of [place];Living will Healthcare Power of [place];Living will Healthcare Power of [employer];Living will Healthcare Power of [place];Living will  Does patient want to make changes to medical advance directive? Yes  (MAU/Ambulatory/Procedural Areas - Information given) No - Patient declined - - - - -  Copy of Healthcare Power of Attorney in Chart? - No - copy requested - No - copy requested Yes - validated most recent copy scanned in chart (See row information) Yes - validated most recent copy scanned in chart (See row information) Yes  Pre-existing out of facility DNR order (yellow form or pink MOST form) - - - - - - -    Current Medications (verified) Outpatient Encounter Medications as of 03/13/2021  Medication Sig   albuterol (VENTOLIN HFA) 108 (90 Base) MCG/ACT inhaler TAKE 2 PUFFS BY MOUTH EVERY 6 HOURS AS NEEDED FOR WHEEZE OR SHORTNESS OF BREATH   amLODipine (NORVASC) 5 MG tablet Take 1 tablet (5 mg total) by mouth daily.   aspirin 81 MG tablet Take 81 mg by mouth daily.   Budeson-Glycopyrrol-Formoterol (BREZTRI AEROSPHERE) 160-9-4.8 MCG/ACT AERO INHALE 2 PUFFS BY MOUTH TWICE A DAY   chlorpheniramine (CHLOR-TRIMETON) 4 MG tablet Take 4 mg by mouth in the morning, at noon, and at bedtime.   chlorthalidone (HYGROTON) 25 MG tablet Take 1 tablet (25 mg total) by mouth daily.   Cholecalciferol (VITAMIN D3) 1000 units CAPS Take 1 capsule (1,000 Units total) by mouth daily.   diphenhydramine-acetaminophen (TYLENOL PM) 25-500 MG TABS tablet Take 1 tablet by mouth at bedtime.   esomeprazole (NEXIUM) 40 MG capsule TAKE 1 CAPSULE BY MOUTH EVERY DAY BEFORE BREAKFAST   famotidine (PEPCID) 20 MG tablet TAKE 1 TABLET BY MOUTH EVERYDAY AT BEDTIME   ferrous sulfate 325 (65 FE) MG EC tablet Take 325 mg by mouth every Monday, Wednesday, and Friday.   Garlic (GARLIQUE PO) Take 1 tablet by mouth daily.   levothyroxine (  SYNTHROID) 100 MCG tablet Take 1 tablet (100 mcg total) by mouth daily before breakfast.   MAGNESIUM PO Take 2 tablets by mouth daily.   metFORMIN (GLUCOPHAGE) 500 MG tablet TAKE 1 TABLET BY MOUTH 2 TIMES DAILY WITH A MEAL.   methocarbamol (ROBAXIN) 500 MG tablet Take 1 tablet (500 mg total) by mouth  every 8 (eight) hours as needed for muscle spasms.   montelukast (SINGULAIR) 10 MG tablet TAKE 1 TABLET BY MOUTH EVERYDAY AT BEDTIME   Omega-3 Fatty Acids (OMEGA 3 500 PO) Take 1 capsule by mouth in the morning and at bedtime.   polyethylene glycol (MIRALAX / GLYCOLAX) packet Take 17 g by mouth 2 (two) times a week.   Polyvinyl Alcohol-Povidone (REFRESH OP) Place 1 drop into both eyes daily.    Specialty Vitamins Products (CARDIOVASCULAR SUPPORT PO) Take 2 capsules by mouth daily.   traMADol (ULTRAM) 50 MG tablet Take by mouth.   valsartan (DIOVAN) 320 MG tablet TAKE 1 TABLET (320 MG TOTAL) BY MOUTH DAILY. PLEASE KEEP UPCOMING APPT FOR FUTURE REFILLS.   No facility-administered encounter medications on file as of 03/13/2021.    Allergies (verified) Ace inhibitors, Crestor [rosuvastatin], Wellbutrin [bupropion hcl], Cymbalta [duloxetine hcl], Lipitor [atorvastatin], Pregabalin, and Tegretol [carbamazepine]   History: Past Medical History:  Diagnosis Date   Allergic rhinitis    Arthritis    hands and knees - ?osteo   Asthma    Chronic headaches    Compression fracture of T12 vertebra (HCC) 07/18/2014   S/p kyphoplasty by Dr Newell Coral (08/2014)    Depression    pt denies   Diabetes type 2, controlled (HCC) 2004   Environmental allergies    dust,mold,mildew   Extrinsic asthma    GERD (gastroesophageal reflux disease)    [name] EGD - mild esophageal dysmotility and small hiatal hernia   Hiatal hernia    Hx of migraines    Hyperlipidemia    mild, diet controlled   Hypertension    Hypothyroidism    Macular degeneration    Osteoporosis with fracture 07/2014   T -1.2 hip, T 0.2 spine, T12 compression fracture s/p kyphoplasty   Past Surgical History:  Procedure Laterality Date   55 HOUR PH STUDY N/A 04/11/2017   Procedure: 24 HOUR PH STUDY;  Surgeon: Napoleon Form, MD;  Location: [facility];  Service: Endoscopy;  Laterality: N/A;   BACK SURGERY     CATARACT EXTRACTION   02/22/2001   bilateral with lens implant   COLONOSCOPY     DEXA  05/23/2008   improved (initial DEXA 2007 - mild osteopenia)   DILATION AND CURETTAGE OF UTERUS  02/23/1976   ESOPHAGEAL MANOMETRY N/A 04/11/2017   Procedure: ESOPHAGEAL MANOMETRY (EM);  Surgeon: Napoleon Form, MD;  Location: [facility];  Service: Endoscopy;  Laterality: N/A;   ESOPHAGOGASTRODUODENOSCOPY  06/25/2011   Procedure: ESOPHAGOGASTRODUODENOSCOPY (EGD);  Surgeon: Hart Carwin, MD;  Location: [facility];  Service: Endoscopy;  Laterality: N/A;  no Xray   EYE SURGERY     KYPHOPLASTY N/A 08/30/2014   Procedure: THORACIC TWELVE KYPHOPLASTY;  Surgeon: [name], MD   KYPHOPLASTY N/A 01/22/2021   Procedure: Thoracic eight, Thoracic nine KYPHOPLASTY;  Surgeon: Coletta Memos, MD;  [facility];  Service: Neurosurgery;  Laterality: N/A;   LAPAROSCOPY ABDOMEN DIAGNOSTIC     To R/O endometriosis   PH IMPEDANCE STUDY N/A 04/11/2017   Procedure: PH IMPEDANCE STUDY;  Surgeon: Napoleon Form, MD;  Location: [facility];  Service: Endoscopy;  Laterality:  N/A;   SAVORY DILATION  06/25/2011   Procedure: SAVORY DILATION;  Surgeon: Hart Carwin, MD;  Location: [facility];  Service: Endoscopy;  Laterality: N/A;   TONSILLECTOMY AND ADENOIDECTOMY  02/23/1960   US ECHOCARDIOGRAPHY  10/24/2014   nl systolic function EF 55%, mild diastolic dysfunction   Family History  Problem Relation Age of Onset   Mitral valve prolapse Father    Diabetes Father    Mitral valve prolapse Mother    Hypertension Mother    Stroke Mother 31       after valve surgery   Parkinson's disease Brother    Other Brother        POST WAR TRAUMA   Stroke Paternal Grandfather    Heart failure Maternal Grandfather    Cancer Neg Hx    Colon cancer Neg Hx    Stomach cancer Neg Hx    Esophageal cancer Neg Hx    Pancreatic cancer Neg Hx    Liver disease Neg Hx    Social History   Socioeconomic History   Marital status: Widowed     Spouse name: Not on file   Number of children: 3   Years of education: Not on file   Highest education level: Not on file  Occupational History   Occupation: Retired  Tobacco Use   Smoking status: Never   Smokeless tobacco: Never  Vaping Use   Vaping Use: Never used  Substance and Sexual Activity   Alcohol use: No    Alcohol/week: 0.0 standard drinks   Drug use: No   Sexual activity: Never  Other Topics Concern   Not on file  Social History Narrative   Caffeine: occasional coffee   Lives alone, widower, 1 cat   Occupation: retired, 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   Act: works 3d/wk Chemical engineer), lives in [place], gardens, walks   Diet: good water, fruits/vegetables daily      HCPOA is Amos Gaber [place] [phone] (cell)   Social Determinants of Health   Financial Resource Strain: Low Risk    Difficulty of Paying Living Expenses: Not hard at all  Food Insecurity: No Food Insecurity   Worried About Programme researcher, broadcasting/film/video in the Last Year: Never true   Ran Out of Food in the Last Year: Never true  Transportation Needs: No Transportation Needs   Lack of Transportation (Medical): No   Lack of Transportation (Non-Medical): No  Physical Activity: Insufficiently Active   Days of Exercise per Week: 3 days   Minutes of Exercise per Session: 20 min  Stress: No Stress Concern Present   Feeling of Stress : Not at all  Social Connections: Socially Isolated   Frequency of Communication with Friends and Family: More than three times a week   Frequency of Social Gatherings with Friends and Family: Once a week   Attends Religious Services: Never   Database administrator or Organizations: No   Attends Banker Meetings: Never   Marital Status: Widowed    Tobacco Counseling Counseling given: Not Answered   Clinical Intake:  Pre-visit preparation completed: Yes  Pain : No/denies pain     BMI - recorded: 26.78 Nutritional Status: BMI 25 -29  Overweight Nutritional Risks: None Diabetes: Yes CBG done?: No Did pt. bring in CBG monitor from home?: No  How often do you need to have someone help you when you read instructions, pamphlets, or other written materials from your doctor or pharmacy?: 1 -  Never  Diabetes:  Is the patient diabetic?  Yes  If diabetic, was a CBG obtained today?  No  Did the patient bring in their glucometer from home?  No  How often do you monitor your CBG's? Not currently monitoring.   Financial Strains and Diabetes Management:  Are you having any financial strains with the device, your supplies or your medication? No .  Does the patient want to be seen by Chronic Care Management for management of their diabetes?  No  Would the patient like to be referred to a Nutritionist or for Diabetic Management?  No   Diabetic Exams:  Diabetic Eye Exam: Completed 12/2020.   Diabetic Foot Exam: Pt due, will discuss with PCP at next visit.     Interpreter Needed?: No  Information entered by :: Sharen Heckamina Derius Ghosh LPN   Activities of Daily Living In your present state of health, do you have any difficulty performing the following activities: 03/13/2021 01/20/2021  Hearing? Malvin JohnsY Y  Vision? Y N  Difficulty concentrating or making decisions? N Y  Walking or climbing stairs? N N  Dressing or bathing? N N  Doing errands, shopping? N N  Preparing Food and eating ? N -  Using the Toilet? N -  In the past six months, have you accidently leaked urine? Y -  Do you have problems with loss of bowel control? N -  Managing your Medications? N -  Managing your Finances? N -  Housekeeping or managing your Housekeeping? N -  Some recent data might be hidden    Patient Care Team: Tower, Audrie GallusMarne A, MD as PCP - General (Family Medicine) Lyn RecordsSmith, Henry W, MD as PCP - Cardiology (Cardiology) Eber JonesShah, Rajiv E, MD as Referring Physician (Ophthalmology) Waymon BudgeYoung, Clinton D, MD as Consulting Physician (Pulmonary Disease) Meylor, August Saucerean, DC  as Consulting Physician (Chiropractic Medicine) Richrd Humbleslay Burton, DDS as Consulting Physician (Dentistry)  Indicate any recent Medical Services you may have received from other than [facility] providers in the past year (date may be approximate).     Assessment:   This is a routine wellness examination for Jordan BernJean.  Hearing/Vision screen Hearing Screening - Comments:: Decrease in hearing  Vision Screening - Comments:: Last exam 12/2020, Dr. Danella DeisShay, wears glasses  Dietary issues and exercise activities discussed: Current Exercise Habits: Home exercise routine, Type of exercise: walking, Time (Minutes): 20, Frequency (Times/Week): 3, Weekly Exercise (Minutes/Week): 60, Intensity: Mild   Goals Addressed             This Visit's Progress    Patient Stated       Would like to maintain drinking more water and eating healthier       Depression Screen PHQ 2/9 Scores 03/13/2021 03/12/2020 09/24/2019 03/12/2019 03/05/2019 02/27/2018 02/09/2017  PHQ - 2 Score 0 0 0 0 0 0 0  PHQ- 9 Score - 0 - 1 0 0 0    Fall Risk Fall Risk  03/13/2021 03/12/2020 03/05/2019 02/27/2018 02/09/2017  Falls in the past year? 1 0 0 0 No  Number falls in past yr: 0 0 0 - -  Injury with Fall? 1 0 0 - -  Comment back was injured - - - -  Risk for fall due to : Impaired balance/gait Medication side effect Medication side effect - -  Follow up Falls prevention discussed Falls evaluation completed;Falls prevention discussed Falls evaluation completed;Falls prevention discussed - -    FALL RISK PREVENTION PERTAINING TO THE HOME:  Any stairs in or around the home?  Yes  If so, are there any without handrails? No  Home free of loose throw rugs in walkways, pet beds, electrical cords, etc? No  Adequate lighting in your home to reduce risk of falls? Yes   ASSISTIVE DEVICES UTILIZED TO PREVENT FALLS:  Life alert? No  Use of a cane, walker or w/c? Yes , walking stick Grab bars in the bathroom? Yes  Shower chair or bench in shower? Yes   Elevated toilet seat or a handicapped toilet? Yes   TIMED UP AND GO:  Was the test performed? No .    Cognitive Function: Normal cognitive status assessed by  this Nurse Health Advisor. No abnormalities found.   MMSE - Mini Mental State Exam 03/12/2020 03/05/2019 02/27/2018 02/09/2017 02/09/2016  Orientation to time 5 5 5 5 5   Orientation to Place 5 5 5 5 5   Registration 3 3 3 3 3   Attention/ Calculation 5 5 0 0 0  Recall 3 3 3 3 3   Language- name 2 objects - - 0 0 0  Language- repeat 1 1 1 1 1   Language- follow 3 step command - - 3 3 3   Language- read & follow direction - - 0 0 0  Write a sentence - - 0 0 0  Copy design - - 0 0 0  Total score - - 20 20 20         Immunizations Immunization History  Administered Date(s) Administered   Fluad Quad(high Dose 65+) 12/08/2019, 12/15/2020   Influenza Whole 11/23/2011, 12/06/2012   Influenza, High Dose Seasonal PF 11/23/2018, 12/08/2019   Influenza,inj,Quad PF,6+ Mos 11/06/2014, 10/21/2015, 11/23/2016, 11/23/2017, 11/09/2018   Influenza-Unspecified 11/22/2013   PFIZER Comirnaty(Gray Top)Covid-19 Tri-Sucrose Vaccine 06/01/2019, 06/27/2019, 03/30/2020   PFIZER(Purple Top)SARS-COV-2 Vaccination 06/01/2019, 06/27/2019, 03/30/2020   Pneumococcal Conjugate-13 05/29/2015   Pneumococcal Polysaccharide-23 10/23/2005   Td 01/14/2012    TDAP status: Due, Education has been provided regarding the importance of this vaccine. Advised may receive this vaccine at local pharmacy or Health Dept. Aware to provide a copy of the vaccination record if obtained from local pharmacy or Health Dept. Verbalized acceptance and understanding.  Flu Vaccine status: Up to date  Pneumococcal vaccine status: Up to date  Covid-19 vaccine status: Information provided on how to obtain vaccines.   Qualifies for Shingles Vaccine? Yes   Zostavax completed Yes   Shingrix Completed?: No.    Education has been provided regarding the importance of this vaccine.  Patient has been advised to call insurance company to determine out of pocket expense if they have not yet received this vaccine. Advised may also receive vaccine at local pharmacy or Health Dept. Verbalized acceptance and understanding.  Screening Tests Health Maintenance  Topic Date Due   Zoster Vaccines- Shingrix (1 of 2) Never done   FOOT EXAM  03/11/2019   COVID-19 Vaccine (6 - Booster) 05/25/2020   OPHTHALMOLOGY EXAM  11/22/2020   HEMOGLOBIN A1C  07/20/2021   TETANUS/TDAP  01/13/2022   Pneumonia Vaccine 52+ Years old  Completed   INFLUENZA VACCINE  Completed   DEXA SCAN  Completed   HPV VACCINES  Aged Out    Health Maintenance  Health Maintenance Due  Topic Date Due   Zoster Vaccines- Shingrix (1 of 2) Never done   FOOT EXAM  03/11/2019   COVID-19 Vaccine (6 - Booster) 05/25/2020   OPHTHALMOLOGY EXAM  11/22/2020    Colorectal cancer screening: No longer required.   Mammogram status: patient due, last completed 01/26/17, patient plans on  discussing with PCP  Bone Density status: Ordered 03/13/21. Pt provided with contact info and advised to call to schedule appt.  Lung Cancer Screening: (Low Dose CT Chest recommended if Age 31-80 years, 30 pack-year currently smoking OR have quit w/in 15years.) does not qualify.     Additional Screening:  Hepatitis C Screening: does not qualify  Vision Screening: Recommended annual ophthalmology exams for early detection of glaucoma and other disorders of the eye. Is the patient up to date with their annual eye exam?  Yes  Who is the provider or what is the name of the office in which the patient attends annual eye exams? Dr. Danella DeisShay   Dental Screening: Recommended annual dental exams for proper oral hygiene  Community Resource Referral / Chronic Care Management: CRR required this visit?  No   CCM required this visit?  No      Plan:     I have personally reviewed and noted the following in the patients chart:   Medical and  social history Use of alcohol, tobacco or illicit drugs  Current medications and supplements including opioid prescriptions.  Functional ability and status Nutritional status Physical activity Advanced directives List of other physicians Hospitalizations, surgeries, and ER visits in previous 12 months Vitals Screenings to include cognitive, depression, and falls Referrals and appointments  In addition, I have reviewed and discussed with patient certain preventive protocols, quality metrics, and best practice recommendations. A written personalized care plan for preventive services as well as general preventive health recommendations were provided to patient.   Due to this being a telephonic visit, the after visit summary with patients personalized plan was offered to patient via mail or my-chart. Patient would like to access on my-chart.   Janne Labamina A Jakia Kennebrew, LPN   [id number]/20/2023   Nurse Health Advisor  Nurse Notes: none

## 2021-03-11 NOTE — Telephone Encounter (Signed)
Called pt to schedule [email or web address]

## 2021-03-13 ENCOUNTER — Ambulatory Visit (INDEPENDENT_AMBULATORY_CARE_PROVIDER_SITE_OTHER): Payer: Medicare PPO

## 2021-03-13 VITALS — Ht 67.0 in | Wt 171.0 lb

## 2021-03-13 DIAGNOSIS — Z780 Asymptomatic menopausal state: Secondary | ICD-10-CM | POA: Diagnosis not present

## 2021-03-13 DIAGNOSIS — Z0000 Encounter for general adult medical examination without abnormal findings: Secondary | ICD-10-CM

## 2021-03-13 NOTE — Patient Instructions (Signed)
Jordan Patterson , Thank you for taking time to complete your Medicare Wellness Visit. I appreciate your ongoing commitment to your health goals. Please review the following plan we discussed and let me know if I can assist you in the future.   Screening recommendations/referrals: Colonoscopy: no longer required  Mammogram: due, last completed 01/26/17, discuss with PCP Bone Density: due, last completed 01/26/17, ordered today, someone will call to schedule an appointment Recommended yearly ophthalmology/optometry visit for glaucoma screening and checkup Recommended yearly dental visit for hygiene and checkup  Vaccinations: Influenza vaccine: up to date  Pneumococcal vaccine: up to date Tdap vaccine: up to date, completed 01/14/12, due 01/13/22  Shingles vaccine: Discuss with pharmacy  Covid-19:newest booster available at your local pharmacy  Advanced directives: Please bring a copy of Living Will and/or Healthcare Power of Attorney for your chart.   Conditions/risks identified: see problem list  Next appointment: Follow up in one year for your annual wellness visit 03/15/22 @ 11:15am, this will be a telephone visit   Preventive Care 65 Years and Older, Female Preventive care refers to lifestyle choices and visits with your health care provider that can promote health and wellness. What does preventive care include? A yearly physical exam. This is also called an annual well check. Dental exams once or twice a year. Routine eye exams. Ask your health care provider how often you should have your eyes checked. Personal lifestyle choices, including: Daily care of your teeth and gums. Regular physical activity. Eating a healthy diet. Avoiding tobacco and drug use. Limiting alcohol use. Practicing safe sex. Taking low-dose aspirin every day. Taking vitamin and mineral supplements as recommended by your health care provider. What happens during an annual well check? The services and  screenings done by your health care provider during your annual well check will depend on your age, overall health, lifestyle risk factors, and family history of disease. Counseling  Your health care provider may ask you questions about your: Alcohol use. Tobacco use. Drug use. Emotional well-being. Home and relationship well-being. Sexual activity. Eating habits. History of falls. Memory and ability to understand (cognition). Work and work Astronomer. Reproductive health. Screening  You may have the following tests or measurements: Height, weight, and BMI. Blood pressure. Lipid and cholesterol levels. These may be checked every 5 years, or more frequently if you are over 72 years old. Skin check. Lung cancer screening. You may have this screening every year starting at age 38 if you have a 30-pack-year history of smoking and currently smoke or have quit within the past 15 years. Fecal occult blood test (FOBT) of the stool. You may have this test every year starting at age 13. Flexible sigmoidoscopy or colonoscopy. You may have a sigmoidoscopy every 5 years or a colonoscopy every 10 years starting at age 10. Hepatitis C blood test. Hepatitis B blood test. Sexually transmitted disease (STD) testing. Diabetes screening. This is done by checking your blood sugar (glucose) after you have not eaten for a while (fasting). You may have this done every 1-3 years. Bone density scan. This is done to screen for osteoporosis. You may have this done starting at age 57. Mammogram. This may be done every 1-2 years. Talk to your health care provider about how often you should have regular mammograms. Talk with your health care provider about your test results, treatment options, and if necessary, the need for more tests. Vaccines  Your health care provider may recommend certain vaccines, such as: Influenza vaccine. This is  recommended every year. Tetanus, diphtheria, and acellular pertussis (Tdap,  Td) vaccine. You may need a Td booster every 10 years. Zoster vaccine. You may need this after age 16. Pneumococcal 13-valent conjugate (PCV13) vaccine. One dose is recommended after age 47. Pneumococcal polysaccharide (PPSV23) vaccine. One dose is recommended after age 54. Talk to your health care provider about which screenings and vaccines you need and how often you need them. This information is not intended to replace advice given to you by your health care provider. Make sure you discuss any questions you have with your health care provider. Document Released: 03/07/2015 Document Revised: 10/29/2015 Document Reviewed: 12/10/2014 Elsevier Interactive Patient Education  2017 [employer] Prevention in the Home Falls can cause injuries. They can happen to people of all ages. There are many things you can do to make your home safe and to help prevent falls. What can I do on the outside of my home? Regularly fix the edges of walkways and driveways and fix any cracks. Remove anything that might make you trip as you walk through a door, such as a raised step or threshold. Trim any bushes or trees on the path to your home. Use bright outdoor lighting. Clear any walking paths of anything that might make someone trip, such as rocks or tools. Regularly check to see if handrails are loose or broken. Make sure that both sides of any steps have handrails. Any raised decks and porches should have guardrails on the edges. Have any leaves, snow, or ice cleared regularly. Use sand or salt on walking paths during winter. Clean up any spills in your garage right away. This includes oil or grease spills. What can I do in the bathroom? Use night lights. Install grab bars by the toilet and in the tub and shower. Do not use towel bars as grab bars. Use non-skid mats or decals in the tub or shower. If you need to sit down in the shower, use a plastic, non-slip stool. Keep the floor dry. Clean up any  water that spills on the floor as soon as it happens. Remove soap buildup in the tub or shower regularly. Attach bath mats securely with double-sided non-slip rug tape. Do not have throw rugs and other things on the floor that can make you trip. What can I do in the bedroom? Use night lights. Make sure that you have a light by your bed that is easy to reach. Do not use any sheets or blankets that are too big for your bed. They should not hang down onto the floor. Have a firm chair that has side arms. You can use this for support while you get dressed. Do not have throw rugs and other things on the floor that can make you trip. What can I do in the kitchen? Clean up any spills right away. Avoid walking on wet floors. Keep items that you use a lot in easy-to-reach places. If you need to reach something above you, use a strong step stool that has a grab bar. Keep electrical cords out of the way. Do not use floor polish or wax that makes floors slippery. If you must use wax, use non-skid floor wax. Do not have throw rugs and other things on the floor that can make you trip. What can I do with my stairs? Do not leave any items on the stairs. Make sure that there are handrails on both sides of the stairs and use them. Fix handrails that are  broken or loose. Make sure that handrails are as long as the stairways. Check any carpeting to make sure that it is firmly attached to the stairs. Fix any carpet that is loose or worn. Avoid having throw rugs at the top or bottom of the stairs. If you do have throw rugs, attach them to the floor with carpet tape. Make sure that you have a light switch at the top of the stairs and the bottom of the stairs. If you do not have them, ask someone to add them for you. What else can I do to help prevent falls? Wear shoes that: Do not have high heels. Have rubber bottoms. Are comfortable and fit you well. Are closed at the toe. Do not wear sandals. If you use a  stepladder: Make sure that it is fully opened. Do not climb a closed stepladder. Make sure that both sides of the stepladder are locked into place. Ask someone to hold it for you, if possible. Clearly mark and make sure that you can see: Any grab bars or handrails. First and last steps. Where the edge of each step is. Use tools that help you move around (mobility aids) if they are needed. These include: Canes. Walkers. Scooters. Crutches. Turn on the lights when you go into a dark area. Replace any light bulbs as soon as they burn out. Set up your furniture so you have a clear path. Avoid moving your furniture around. If any of your floors are uneven, fix them. If there are any pets around you, be aware of where they are. Review your medicines with your doctor. Some medicines can make you feel dizzy. This can increase your chance of falling. Ask your doctor what other things that you can do to help prevent falls. This information is not intended to replace advice given to you by your health care provider. Make sure you discuss any questions you have with your health care provider. Document Released: 12/05/2008 Document Revised: 07/17/2015 Document Reviewed: 03/15/2014 Elsevier Interactive Patient Education  2017 [employer].

## 2021-03-18 DIAGNOSIS — M9902 Segmental and somatic dysfunction of thoracic region: Secondary | ICD-10-CM | POA: Diagnosis not present

## 2021-03-18 DIAGNOSIS — S233XXA Sprain of ligaments of thoracic spine, initial encounter: Secondary | ICD-10-CM | POA: Diagnosis not present

## 2021-03-19 ENCOUNTER — Encounter: Payer: Self-pay | Admitting: Family Medicine

## 2021-04-01 ENCOUNTER — Telehealth: Payer: Self-pay

## 2021-04-01 DIAGNOSIS — M9902 Segmental and somatic dysfunction of thoracic region: Secondary | ICD-10-CM | POA: Diagnosis not present

## 2021-04-01 DIAGNOSIS — S233XXA Sprain of ligaments of thoracic spine, initial encounter: Secondary | ICD-10-CM | POA: Diagnosis not present

## 2021-04-01 NOTE — Telephone Encounter (Signed)
[facility] Night - Client Nonclinical Telephone Record  [facility] Client [facility] Night - Client Client Site [facility] - Night Provider Roxy Manns - MD Contact Type Call Who Is Calling Patient / Member / Family / Caregiver Caller Name [name] Caller Phone Number [phone] or cell [phone] Patient Name Jordan Patterson Patient DOB 25-Jan-1941 Call Type Message Only Information Provided Reason for Call Request to Reschedule Office Appointment Initial Comment Caller states she has a lab appt on Tuesday before she sees the doctor. Needs to reschedule the lab for the morning. Patient request to speak to RN No Additional Comment Caller states she could also do Monday morning. Office hours provided and advised to follow up with the office. Disp. Time Disposition Final User 04/01/2021 9:14:52 AM General Information Provided Yes Dorcas Carrow Call Closed By: Dorcas Carrow Transaction Date/Time: 04/01/2021 9:11:31 AM (ET

## 2021-04-02 ENCOUNTER — Encounter: Payer: Self-pay | Admitting: Family Medicine

## 2021-04-02 ENCOUNTER — Telehealth: Payer: Self-pay | Admitting: Family Medicine

## 2021-04-02 DIAGNOSIS — I10 Essential (primary) hypertension: Secondary | ICD-10-CM

## 2021-04-02 DIAGNOSIS — E1169 Type 2 diabetes mellitus with other specified complication: Secondary | ICD-10-CM

## 2021-04-02 DIAGNOSIS — E785 Hyperlipidemia, unspecified: Secondary | ICD-10-CM

## 2021-04-02 DIAGNOSIS — E039 Hypothyroidism, unspecified: Secondary | ICD-10-CM

## 2021-04-02 DIAGNOSIS — E119 Type 2 diabetes mellitus without complications: Secondary | ICD-10-CM

## 2021-04-02 DIAGNOSIS — M810 Age-related osteoporosis without current pathological fracture: Secondary | ICD-10-CM

## 2021-04-02 NOTE — Telephone Encounter (Signed)
-----   Message from Alvina Chou sent at 03/24/2021  7:42 AM EST ----- Regarding: Lab orders for Friday, 2.10.23 Patient is scheduled for CPX labs, please order future labs, Thanks , Camelia Eng

## 2021-04-02 NOTE — Telephone Encounter (Signed)
Pt's appt on 2/14 is a CPE, will route to PCP to review

## 2021-04-03 ENCOUNTER — Other Ambulatory Visit: Payer: Medicare PPO

## 2021-04-07 ENCOUNTER — Encounter: Payer: Self-pay | Admitting: Family Medicine

## 2021-04-07 ENCOUNTER — Other Ambulatory Visit: Payer: Self-pay

## 2021-04-07 ENCOUNTER — Ambulatory Visit (INDEPENDENT_AMBULATORY_CARE_PROVIDER_SITE_OTHER): Payer: Medicare PPO | Admitting: Family Medicine

## 2021-04-07 VITALS — BP 140/80 | HR 95 | Temp 98.3°F | Ht 65.95 in | Wt 164.2 lb

## 2021-04-07 DIAGNOSIS — E119 Type 2 diabetes mellitus without complications: Secondary | ICD-10-CM

## 2021-04-07 DIAGNOSIS — I10 Essential (primary) hypertension: Secondary | ICD-10-CM

## 2021-04-07 DIAGNOSIS — T466X5A Adverse effect of antihyperlipidemic and antiarteriosclerotic drugs, initial encounter: Secondary | ICD-10-CM

## 2021-04-07 DIAGNOSIS — E2839 Other primary ovarian failure: Secondary | ICD-10-CM

## 2021-04-07 DIAGNOSIS — Z8781 Personal history of (healed) traumatic fracture: Secondary | ICD-10-CM | POA: Diagnosis not present

## 2021-04-07 DIAGNOSIS — G720 Drug-induced myopathy: Secondary | ICD-10-CM | POA: Diagnosis not present

## 2021-04-07 DIAGNOSIS — E1169 Type 2 diabetes mellitus with other specified complication: Secondary | ICD-10-CM

## 2021-04-07 DIAGNOSIS — Z79899 Other long term (current) drug therapy: Secondary | ICD-10-CM | POA: Diagnosis not present

## 2021-04-07 DIAGNOSIS — M810 Age-related osteoporosis without current pathological fracture: Secondary | ICD-10-CM | POA: Diagnosis not present

## 2021-04-07 DIAGNOSIS — N939 Abnormal uterine and vaginal bleeding, unspecified: Secondary | ICD-10-CM

## 2021-04-07 DIAGNOSIS — E039 Hypothyroidism, unspecified: Secondary | ICD-10-CM

## 2021-04-07 DIAGNOSIS — Z0000 Encounter for general adult medical examination without abnormal findings: Secondary | ICD-10-CM | POA: Diagnosis not present

## 2021-04-07 DIAGNOSIS — E785 Hyperlipidemia, unspecified: Secondary | ICD-10-CM

## 2021-04-07 NOTE — Assessment & Plan Note (Signed)
Pt mentioned  Ref done for urogyn to follow up earlier than planned

## 2021-04-07 NOTE — Assessment & Plan Note (Signed)
bp in fair control at this time  BP Readings from Last 1 Encounters:  04/07/21 140/80   No changes needed Most recent labs reviewed  Disc lifstyle change with low sodium diet and exercise  Plan to continue Diovan 320 mg daily  Amlodipine 5 mg daily  Chlorthalidone 25 mg daily

## 2021-04-07 NOTE — Assessment & Plan Note (Signed)
Intol of any statin at any dose

## 2021-04-07 NOTE — Assessment & Plan Note (Signed)
Pt is open to dexa now -ordered Had spinal comp fracture  Will likely start alendronate-want to get study first  Info given re: medication and side eff disc  No pressing dental problems currently  Enc her to continue ca and D and exercise as tolerated  Fall precautions rev

## 2021-04-07 NOTE — Assessment & Plan Note (Signed)
No clinical change TSH ordered for march  Takes 100 mcg levothyroxine daily

## 2021-04-07 NOTE — Assessment & Plan Note (Signed)
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

## 2021-04-07 NOTE — Progress Notes (Signed)
Subjective:    Patient ID: Jordan Patterson, female    DOB: 1941-02-14, 81 y.o.   MRN: [id number]  This visit occurred during the SARS-CoV-2 public health emergency.  Safety protocols were in place, including screening questions prior to the visit, additional usage of staff PPE, and extensive cleaning of exam room while observing appropriate contact time as indicated for disinfecting solutions.   HPI Here for health maintenance exam and to review chronic medical problems    Wt Readings from Last 3 Encounters:  04/07/21 164 lb 3.2 oz (74.5 kg)  03/13/21 171 lb (77.6 kg)  01/22/21 171 lb 12.8 oz (77.9 kg)   26.55 kg/m  Did ok with her kyphoplasty  Gradually getting back to normal  Still some discomfort   Not a lot of appetite as she ages  Has a protein drink in between meals /low sugar one    Zoster status, declines shingrix vaccine  Covid immunized Td 2013 Pna vaccines utd Flu shot in the fall  Dexa 01/2017 osteopenia  Spinal compression fractures , kyphoplasty in dec Pt denies forteo (? In chart somewhere)  Falls: none since last visit /using cane and walking stick  Fractures: spinal fracture in dec  Supplements : vit D and ca with mag  Exercise : limited/doing better now   Mammogram 2018- declines at her age  Self breast exam- no lumps   Cologuard 2017  Negative    HTN bp is stable today  No cp or palpitations or headaches or edema  No side effects to medicines  BP Readings from Last 3 Encounters:  04/07/21 140/80  01/23/21 133/76  01/20/21 (!) 159/98     Diovan 320 mg daily  Amlodipine 5 mg daily  Chlorthalidone 25 mg daily   DM2 Lab Results  Component Value Date   HGBA1C 7.2 (H) 01/20/2021  Eye exam: gets once per year    due in July  Has mac deg but not retinopathy  Metformin 500 mg bid   Watches sugar in her diet  Occ eats sweets  More active than a month ago    Does not feel bad  No thirst   (does have dry mouth baseline)  No excess  urination    Hyperlipidemia Lab Results  Component Value Date   CHOL 250 (H) 03/17/2020   HDL 53.10 03/17/2020   LDLCALC 156 (H) 03/05/2019   LDLDIRECT 164.0 03/17/2020   TRIG 230.0 (H) 03/17/2020   CHOLHDL 5 03/17/2020  Intol to all statins Sees cardiology   Diet is fairly low fat   Hypothyroidism  Pt has no clinical changes No change in energy level/ hair or skin/ edema and no tremor Lab Results  Component Value Date   TSH 3.65 06/17/2020     Levothyroxine 100 mcg   She sees [facility] at [facility] bladder prolapse  Symptoms come and go  Appt in April  ? 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   Hyponatremia 12/14/2018   Laryngopharyngeal reflux (LPR) 08/24/2017   Osteoporosis 08/24/2017   Leg cramping 08/21/2017   Regurgitation of food  Prolapse of female pelvic organs 02/19/2017   History of compression fracture of spine 07/18/2014   Hypertensive retinopathy of both eyes 06/04/2014   Nonexudative age-related macular degeneration 06/04/2014   Posterior vitreous detachment of both eyes 06/04/2014   Pseudophakia of both eyes 06/04/2014   Right knee pain 05/30/2014   Advanced care planning/counseling discussion 01/24/2014   Health maintenance examination 01/24/2014   Dysgeusia 11/12/2013   Upper airway cough syndrome 03/19/2013   Allergic conjunctivitis and rhinitis 05/15/2012   Asthma, moderate persistent 03/09/2012   Medicare annual wellness visit, subsequent 01/14/2012   Hiatal hernia    GERD (gastroesophageal reflux disease)    Essential hypertension     Hyperlipidemia associated with type 2 diabetes mellitus ([place])    Depression    Hypothyroidism    Diabetes type 2, controlled ([place])    Osteoarthritis    Past Medical History:  Diagnosis Date   Allergic rhinitis    Arthritis    hands and knees - ?osteo   Asthma    Chronic headaches    Compression fracture of T12 vertebra ([place]) 07/18/2014   S/p kyphoplasty by Dr Sherwood Gambler (08/2014)    Depression    pt denies   Diabetes type 2, controlled ([place]) 2004   Environmental allergies    dust,mold,mildew   Extrinsic asthma    GERD (gastroesophageal reflux disease)    Olevia Perches) EGD - mild esophageal dysmotility and small hiatal hernia   Hiatal hernia    Hx of migraines    Hyperlipidemia    mild, diet controlled   Hypertension    Hypothyroidism    Macular degeneration    Osteoporosis with fracture 07/2014   T -1.2 hip, T 0.2 spine, T12 compression fracture s/p kyphoplasty   Past Surgical History:  Procedure Laterality Date   80 HOUR [place] STUDY N/A 04/11/2017   Procedure: 16 HOUR PH STUDY;  Surgeon: Mauri Pole, MD;  Location: [facility];  Service: Endoscopy;  Laterality: N/A;   BACK SURGERY     CATARACT EXTRACTION  02/22/2001   bilateral with lens implant   COLONOSCOPY     DEXA  05/23/2008   improved (initial DEXA 2007 - mild osteopenia)   DILATION AND CURETTAGE OF UTERUS  02/23/1976   ESOPHAGEAL MANOMETRY N/A 04/11/2017   Procedure: ESOPHAGEAL MANOMETRY (EM);  Surgeon: Mauri Pole, MD;  Location: [facility];  Service: Endoscopy;  Laterality: N/A;   ESOPHAGOGASTRODUODENOSCOPY  06/25/2011   Procedure: ESOPHAGOGASTRODUODENOSCOPY (EGD);  Surgeon: Lafayette Dragon, MD;  Location: [facility];  Service: Endoscopy;  Laterality: N/A;  no Xray   EYE SURGERY     KYPHOPLASTY N/A 08/30/2014   Procedure: THORACIC TWELVE KYPHOPLASTY;  Surgeon: Jovita Gamma, MD   KYPHOPLASTY N/A 01/22/2021   Procedure: Thoracic eight, Thoracic nine KYPHOPLASTY;  Surgeon: Ashok Pall, MD;   Location: [facility];  Service: Neurosurgery;  Laterality: N/A;   LAPAROSCOPY ABDOMEN DIAGNOSTIC     To R/O endometriosis   PH IMPEDANCE STUDY N/A 04/11/2017   Procedure: [place] IMPEDANCE STUDY;  Surgeon: Mauri Pole, MD;  [facility];  Service: Endoscopy;  Laterality: N/A;   SAVORY DILATION  06/25/2011   Procedure: SAVORY DILATION;  Surgeon: Lafayette Dragon, MD;  [facility];  Service: Endoscopy;  Laterality: N/A;   TONSILLECTOMY AND ADENOIDECTOMY  02/23/1960   US ECHOCARDIOGRAPHY  50/10/3816   nl systolic function EF 29%, mild diastolic dysfunction   Social History   Tobacco Use   Smoking status: Never   Smokeless tobacco: Never  Vaping Use   Vaping Use: Never used  Substance Use Topics   Alcohol use: No    Alcohol/week: 0.0 standard drinks   Drug use: No   Family History  Problem Relation Age of Onset   Mitral valve prolapse Father    Diabetes Father    Mitral valve prolapse Mother    Hypertension Mother    Stroke Mother 10       after valve surgery   Parkinson's disease Brother    Other Brother        POST WAR TRAUMA   Stroke Paternal Grandfather    Heart failure Maternal Grandfather    Cancer Neg Hx    Colon cancer Neg Hx    Stomach cancer Neg Hx    Esophageal cancer Neg Hx    Pancreatic cancer Neg Hx    Liver disease Neg Hx    Allergies  Allergen Reactions   Ace Inhibitors Cough   Crestor [Rosuvastatin] Other (See Comments)    myalgias   Wellbutrin [Bupropion Hcl] Other (See Comments)    Unknown reaction   Cymbalta [Duloxetine Hcl] Palpitations and Other (See Comments)    headaches   Lipitor [Atorvastatin] Cough   Pregabalin Other (See Comments)    Unknown reaction   Tegretol [Carbamazepine] Other (See Comments)    Dizziness, headache   Current Outpatient Medications on File Prior to Visit  Medication Sig Dispense Refill   albuterol (VENTOLIN HFA) 108 (90 Base) MCG/ACT inhaler TAKE 2 PUFFS BY MOUTH EVERY 6 HOURS AS NEEDED FOR WHEEZE  OR SHORTNESS OF BREATH 18 g 11   amLODipine (NORVASC) 5 MG tablet Take 1 tablet (5 mg total) by mouth daily. 90 tablet 3   aspirin 81 MG tablet Take 81 mg by mouth daily.     Budeson-Glycopyrrol-Formoterol (BREZTRI AEROSPHERE) 160-9-4.8 MCG/ACT AERO INHALE 2 PUFFS BY MOUTH TWICE A DAY 10.7 g 4   Calcium 250 MG CAPS Take 500 mg by mouth in the morning and at bedtime.     chlorpheniramine (CHLOR-TRIMETON) 4 MG tablet Take 4 mg by mouth in the morning, at noon, and at bedtime.     chlorthalidone (HYGROTON) 25 MG tablet Take 1 tablet (25 mg total) by mouth daily. 90 tablet 1   Cholecalciferol (VITAMIN D3) 1000 units CAPS Take 1 capsule (1,000 Units total) by mouth daily. 30 capsule    diphenhydramine-acetaminophen (TYLENOL PM) 25-500 MG TABS tablet Take 1 tablet by mouth at bedtime.     esomeprazole (NEXIUM) 40 MG capsule TAKE 1 CAPSULE BY MOUTH EVERY DAY BEFORE BREAKFAST 90 capsule 6   famotidine (PEPCID) 20 MG tablet TAKE 1 TABLET BY MOUTH EVERYDAY AT BEDTIME 90 tablet 6   ferrous sulfate 325 (65 FE) MG EC tablet Take 325 mg by mouth every Monday, Wednesday, and Friday.     Garlic (GARLIQUE PO) Take 1 tablet by mouth daily.     levothyroxine (SYNTHROID) 100 MCG tablet Take 1 tablet (100 mcg total) by mouth daily before breakfast. 90 tablet 1   MAGNESIUM PO Take 2 tablets by mouth daily.     metFORMIN (GLUCOPHAGE) 500 MG tablet TAKE 1 TABLET BY MOUTH 2 TIMES DAILY WITH A MEAL. 60 tablet 0   montelukast (SINGULAIR) 10 MG tablet TAKE 1 TABLET BY MOUTH EVERYDAY AT BEDTIME 90 tablet 1   Multiple Vitamins-Minerals (VISION HEALTH PO) Take by mouth.     Nutritional Supplements (GLUCOSE MANAGEMENT PO) Take by mouth.     Omega-3 Fatty Acids (OMEGA 3 500 PO)  Take 1 capsule by mouth in the morning and at bedtime.     polyethylene glycol (MIRALAX / GLYCOLAX) packet Take 17 g by mouth 2 (two) times a week.     Polyvinyl Alcohol-Povidone (REFRESH OP) Place 1 drop into both eyes daily.      Specialty Vitamins  Products (CARDIOVASCULAR SUPPORT PO) Take 2 capsules by mouth daily.     traMADol (ULTRAM) 50 MG tablet Take by mouth.     valsartan (DIOVAN) 320 MG tablet TAKE 1 TABLET (320 MG TOTAL) BY MOUTH DAILY. PLEASE KEEP UPCOMING APPT FOR FUTURE REFILLS. 90 tablet 3   methocarbamol (ROBAXIN) 500 MG tablet Take 1 tablet (500 mg total) by mouth every 8 (eight) hours as needed for muscle spasms. (Patient not taking: Reported on 04/07/2021) 30 tablet 1   No current facility-administered medications on file prior to visit.    Review of Systems  Constitutional:  Negative for activity change, appetite change, fatigue, fever and unexpected weight change.  HENT:  Negative for congestion, ear pain, rhinorrhea, sinus pressure and sore throat.   Eyes:  Negative for pain, redness and visual disturbance.  Respiratory:  Negative for cough, shortness of breath and wheezing.   Cardiovascular:  Negative for chest pain and palpitations.  Gastrointestinal:  Negative for abdominal pain, blood in stool, constipation and diarrhea.  Endocrine: Negative for polydipsia and polyuria.  Genitourinary:  Positive for vaginal bleeding. Negative for dysuria, frequency and urgency.  Musculoskeletal:  Positive for back pain. Negative for arthralgias and myalgias.  Skin:  Negative for pallor and rash.  Allergic/Immunologic: Negative for environmental allergies.  Neurological:  Negative for dizziness, syncope and headaches.  Hematological:  Negative for adenopathy. Does not bruise/bleed easily.  Psychiatric/Behavioral:  Negative for decreased concentration and dysphoric mood. The patient is not nervous/anxious.       Objective:   Physical Exam Constitutional:      General: She is not in acute distress.    Appearance: Normal appearance. She is well-developed and normal weight. She is not ill-appearing or diaphoretic.  HENT:     Head: Normocephalic and atraumatic.     Right Ear: Tympanic membrane, ear canal and external ear normal.      Left Ear: Tympanic membrane, ear canal and external ear normal.     Nose: Nose normal. No congestion.     Mouth/Throat:     Mouth: Mucous membranes are moist.     Pharynx: Oropharynx is clear. No posterior oropharyngeal erythema.  Eyes:     General: No scleral icterus.    Extraocular Movements: Extraocular movements intact.     Conjunctiva/sclera: Conjunctivae normal.     Pupils: Pupils are equal, round, and reactive to light.  Neck:     Thyroid: No thyromegaly.     Vascular: No carotid bruit or JVD.  Cardiovascular:     Rate and Rhythm: Normal rate and regular rhythm.     Pulses: Normal pulses.     Heart sounds: Normal heart sounds.    No gallop.  Pulmonary:     Effort: Pulmonary effort is normal. No respiratory distress.     Breath sounds: Normal breath sounds. No wheezing.     Comments: Good air exch Chest:     Chest wall: No tenderness.  Abdominal:     General: Bowel sounds are normal. There is no distension or abdominal bruit.     Palpations: Abdomen is soft. There is no mass.     Tenderness: There is no abdominal tenderness.  Hernia: No hernia is present.  Genitourinary:    Comments: Breast exam: No mass, nodules, thickening, tenderness, bulging, retraction, inflamation, nipple discharge or skin changes noted.  No axillary or clavicular LA.     Musculoskeletal:        General: No tenderness. Normal range of motion.     Cervical back: Normal range of motion and neck supple. No rigidity. No muscular tenderness.     Right lower leg: No edema.     Left lower leg: No edema.     Comments: Kyphosis   Lymphadenopathy:     Cervical: No cervical adenopathy.  Skin:    General: Skin is warm and dry.     Coloration: Skin is not pale.     Findings: No erythema or rash.     Comments: Solar lentigines diffusely   Neurological:     Mental Status: She is alert. Mental status is at baseline.     Cranial Nerves: No cranial nerve deficit.     Motor: No abnormal muscle tone.      Coordination: Coordination normal.     Gait: Gait normal.     Deep Tendon Reflexes: Reflexes are normal and symmetric. Reflexes normal.  Psychiatric:        Mood and Affect: Mood normal.        Cognition and Memory: Cognition and memory normal.     Comments: sharp          Assessment & Plan:   Problem List Items Addressed This Visit       Cardiovascular and Mediastinum   Essential hypertension    bp in fair control at this time  BP Readings from Last 1 Encounters:  04/07/21 140/80  No changes needed Most recent labs reviewed  Disc lifstyle change with low sodium diet and exercise  Plan to continue Diovan 320 mg daily  Amlodipine 5 mg daily  Chlorthalidone 25 mg daily         Endocrine   Diabetes type 2, controlled ([place])    Lab Results  Component Value Date   HGBA1C 7.2 (H) 01/20/2021  Planning a1c early march Diet is fair  If not imp would be open to starting glucose checks at home Eye exam is due in the summer per pt  On arb Cannot take a statin       Hyperlipidemia associated with type 2 diabetes mellitus ([place])    Disc goals for lipids and reasons to control them Rev last labs with pt Rev low sat fat diet in detail  Statin intolerant, her cardiologist is aware  Labs ordered for march      Hypothyroidism    No clinical change TSH ordered for march  Takes 100 mcg levothyroxine daily         Musculoskeletal and Integument   History of compression fracture of spine    Gradual clinical imp after kyphoplasty  dexa ordered       Osteoporosis    Pt is open to dexa now -ordered Had spinal comp fracture  Will likely start alendronate-want to get study first  Info given re: medication and side eff disc  No pressing dental problems currently  Enc her to continue ca and D and exercise as tolerated  Fall precautions rev       Relevant Medications   Calcium 250 MG CAPS   Statin myopathy    Intol of any statin at any dose        Other  Current use of proton pump inhibitor    Last B12 level was not low      Estrogen deficiency   Relevant Orders   DG Bone Density   Health maintenance examination - Primary    Reviewed health habits including diet and exercise and skin cancer prevention Reviewed appropriate screening tests for age  Also reviewed health mt list, fam hx and immunization status , as well as social and family history   See HPI Labs ordered for next month  Declines mammogram/breast cancer screening  Declines colon cancer screening  Declines shingrix vaccine  Tetanus postponed for ins  dexa ordered / had spinal comp fx this year       Vaginal spotting    Pt mentioned  Ref done for urogyn to follow up earlier than planned        Relevant Orders   Ambulatory referral to Urogynecology

## 2021-04-07 NOTE — Assessment & Plan Note (Signed)
Reviewed health habits including diet and exercise and skin cancer prevention Reviewed appropriate screening tests for age  Also reviewed health mt list, fam hx and immunization status , as well as social and family history   See HPI Labs ordered for next month  Declines mammogram/breast cancer screening  Declines colon cancer screening  Declines shingrix vaccine  Tetanus postponed for ins  dexa ordered / had spinal comp fx this year

## 2021-04-07 NOTE — Assessment & Plan Note (Signed)
Last B12 level was not low

## 2021-04-07 NOTE — Assessment & Plan Note (Signed)
Lab Results  Component Value Date   HGBA1C 7.2 (H) 01/20/2021   Planning a1c early march Diet is fair  If not imp would be open to starting glucose checks at home Eye exam is due in the summer per pt  On arb Cannot take a statin

## 2021-04-07 NOTE — Patient Instructions (Addendum)
I want you to see Dr Ashley Royalty early if possible for the spotting   I placed a referral  Call over there later in the week to make your appt.   Keep taking the calcium and vitamin D Mustard can help leg cramps   Plan labs in march   Call to schedule your dexa (bone density test)  Please call the location of your choice from the menu below to schedule your Mammogram and/or Bone Density appointment.    [facility]   [facility]                      Phone:  [phone] [address]                               [place], [place] [address]                                                             Services: Traditional and 3D Mammogram, Bone Density   [facility]                 Phone: [phone] [address]                                                       [place], [place] [address]    Service: Bone Density ONLY   *this site does NOT perform mammograms  [facility]                        Phone:  [phone] [address]                                  [place], [place] [address]                                            Services:  3D Mammogram and Bone Density    [facility]   Phone:  [phone]   [address]                                                                            [place], [place] [address]                                            Services: 3D Mammogram and Bone Density  [facility]  [facility]  Phone:  [phone]   [address]                        [place], [place] [address]                                              Services:  3D Mammogram and Bone Density

## 2021-04-07 NOTE — Assessment & Plan Note (Signed)
Disc goals for lipids and reasons to control them Rev last labs with pt Rev low sat fat diet in detail  Statin intolerant, her cardiologist is aware  Labs ordered for march

## 2021-04-08 ENCOUNTER — Other Ambulatory Visit: Payer: Self-pay | Admitting: Family Medicine

## 2021-04-12 ENCOUNTER — Encounter: Payer: Self-pay | Admitting: Family Medicine

## 2021-04-16 DIAGNOSIS — N938 Other specified abnormal uterine and vaginal bleeding: Secondary | ICD-10-CM | POA: Diagnosis not present

## 2021-04-16 DIAGNOSIS — N939 Abnormal uterine and vaginal bleeding, unspecified: Secondary | ICD-10-CM | POA: Diagnosis not present

## 2021-04-16 DIAGNOSIS — N812 Incomplete uterovaginal prolapse: Secondary | ICD-10-CM | POA: Diagnosis not present

## 2021-04-16 DIAGNOSIS — N765 Ulceration of vagina: Secondary | ICD-10-CM | POA: Diagnosis not present

## 2021-04-21 DIAGNOSIS — M9902 Segmental and somatic dysfunction of thoracic region: Secondary | ICD-10-CM | POA: Diagnosis not present

## 2021-04-21 DIAGNOSIS — S233XXA Sprain of ligaments of thoracic spine, initial encounter: Secondary | ICD-10-CM | POA: Diagnosis not present

## 2021-04-24 ENCOUNTER — Other Ambulatory Visit: Payer: Self-pay | Admitting: Family Medicine

## 2021-04-24 DIAGNOSIS — E039 Hypothyroidism, unspecified: Secondary | ICD-10-CM

## 2021-04-29 ENCOUNTER — Telehealth: Payer: Self-pay | Admitting: *Deleted

## 2021-04-29 NOTE — Telephone Encounter (Signed)
PLEASE NOTE: All timestamps contained within this report are represented as [place] Standard Time. ?CONFIDENTIALTY NOTICE: This fax transmission is intended only for the addressee. It contains information that is legally privileged, confidential or otherwise protected from ?use or disclosure. If you are not the intended recipient, you are strictly prohibited from reviewing, disclosing, copying using or disseminating any of this information or taking any ?action in reliance on or regarding this information. If you have received this fax in error, please notify us immediately by telephone so that we can arrange for its return to [place]. ?Phone: [phone], Toll-Free: [phone], Fax: [phone] ?Page: 1 of 1 ?Call Id: [id number] ?[facility] Night - Client ?Nonclinical Telephone Record  ?AccessNurse? ?Client [facility] Night - Client ?Client Site [facility] - Night ?Provider AA - PHYSICIAN, UNKNOWN- MD ?Contact Type Call ?Who Is Calling Physician / Provider / Hospital ?Call Type Provider Call Message Only ?Reason for Call Request to send message to Office ?Initial Comment Caller states she is calling from [facility] [place] [facility]. She has a patient that has a bone fracture. She needs ?to fax some information and verify the patients Dr. ?Additional Comment Patients name Jordan Patterson DOB 1940-05-30 Phone number [phone] ?Disp. Time Disposition Final User ?04/29/2021 1:40:35 PM General Information Provided Yes JOHNS, MATHEW ?Call Closed By: Jacques Navy ?Transaction Date/Time: 04/29/2021 1:38:16 PM (ET) ?

## 2021-04-30 ENCOUNTER — Other Ambulatory Visit: Payer: Self-pay

## 2021-04-30 ENCOUNTER — Other Ambulatory Visit (INDEPENDENT_AMBULATORY_CARE_PROVIDER_SITE_OTHER): Payer: Medicare PPO

## 2021-04-30 DIAGNOSIS — E785 Hyperlipidemia, unspecified: Secondary | ICD-10-CM | POA: Diagnosis not present

## 2021-04-30 DIAGNOSIS — E119 Type 2 diabetes mellitus without complications: Secondary | ICD-10-CM

## 2021-04-30 DIAGNOSIS — M810 Age-related osteoporosis without current pathological fracture: Secondary | ICD-10-CM | POA: Diagnosis not present

## 2021-04-30 DIAGNOSIS — E1169 Type 2 diabetes mellitus with other specified complication: Secondary | ICD-10-CM | POA: Diagnosis not present

## 2021-04-30 DIAGNOSIS — E039 Hypothyroidism, unspecified: Secondary | ICD-10-CM

## 2021-04-30 DIAGNOSIS — I10 Essential (primary) hypertension: Secondary | ICD-10-CM | POA: Diagnosis not present

## 2021-04-30 LAB — CBC WITH DIFFERENTIAL/PLATELET
Basophils Absolute: 0.1 10*3/uL (ref 0.0–0.1)
Basophils Relative: 1.6 % (ref 0.0–3.0)
Eosinophils Absolute: 0.1 10*3/uL (ref 0.0–0.7)
Eosinophils Relative: 1.3 % (ref 0.0–5.0)
HCT: 39.2 % (ref 36.0–46.0)
Hemoglobin: 13.4 g/dL (ref 12.0–15.0)
Lymphocytes Relative: 41.8 % (ref 12.0–46.0)
Lymphs Abs: 2.7 10*3/uL (ref 0.7–4.0)
MCHC: 34.2 g/dL (ref 30.0–36.0)
MCV: 89.9 fl (ref 78.0–100.0)
Monocytes Absolute: 0.4 10*3/uL (ref 0.1–1.0)
Monocytes Relative: 6.8 % (ref 3.0–12.0)
Neutro Abs: 3.1 10*3/uL (ref 1.4–7.7)
Neutrophils Relative %: 48.5 % (ref 43.0–77.0)
Platelets: 200 10*3/uL (ref 150.0–400.0)
RBC: 4.36 Mil/uL (ref 3.87–5.11)
RDW: 13.2 % (ref 11.5–15.5)
WBC: 6.4 10*3/uL (ref 4.0–10.5)

## 2021-04-30 LAB — COMPREHENSIVE METABOLIC PANEL
ALT: 23 U/L (ref 0–35)
AST: 28 U/L (ref 0–37)
Albumin: 4.6 g/dL (ref 3.5–5.2)
Alkaline Phosphatase: 68 U/L (ref 39–117)
BUN: 16 mg/dL (ref 6–23)
CO2: 31 mEq/L (ref 19–32)
Calcium: 9.6 mg/dL (ref 8.4–10.5)
Chloride: 90 mEq/L — ABNORMAL LOW (ref 96–112)
Creatinine, Ser: 0.79 mg/dL (ref 0.40–1.20)
GFR: 70.59 mL/min (ref >60.00)
Glucose, Bld: 137 mg/dL — ABNORMAL HIGH (ref 70–99)
Potassium: 4.1 mEq/L (ref 3.5–5.1)
Sodium: 130 mEq/L — ABNORMAL LOW (ref 135–145)
Total Bilirubin: 0.6 mg/dL (ref 0.2–1.2)
Total Protein: 7 g/dL (ref 6.0–8.3)

## 2021-04-30 LAB — LIPID PANEL
Cholesterol: 255 mg/dL — ABNORMAL HIGH (ref 0–200)
HDL: 68.5 mg/dL (ref >39.00)
LDL Cholesterol: 163 mg/dL — ABNORMAL HIGH (ref 0–99)
NonHDL: 186.85
Total CHOL/HDL Ratio: 4
Triglycerides: 117 mg/dL (ref 0.0–149.0)
VLDL: 23.4 mg/dL (ref 0.0–40.0)

## 2021-04-30 LAB — HEMOGLOBIN A1C: Hgb A1c MFr Bld: 7.1 % — ABNORMAL HIGH (ref 4.6–6.5)

## 2021-04-30 LAB — VITAMIN D 25 HYDROXY (VIT D DEFICIENCY, FRACTURES): VITD: 65.3 ng/mL (ref 30.00–100.00)

## 2021-04-30 LAB — TSH: TSH: 5.7 u[IU]/mL — ABNORMAL HIGH (ref 0.35–5.50)

## 2021-05-03 ENCOUNTER — Encounter: Payer: Self-pay | Admitting: Interventional Cardiology

## 2021-05-04 MED ORDER — CHLORTHALIDONE 25 MG PO TABS: 25.0000 mg | ORAL_TABLET | Freq: Every day | ORAL | 1 refills | Status: DC

## 2021-05-05 ENCOUNTER — Telehealth: Payer: Self-pay

## 2021-05-05 DIAGNOSIS — M9902 Segmental and somatic dysfunction of thoracic region: Secondary | ICD-10-CM | POA: Diagnosis not present

## 2021-05-05 DIAGNOSIS — S233XXA Sprain of ligaments of thoracic spine, initial encounter: Secondary | ICD-10-CM | POA: Diagnosis not present

## 2021-05-05 NOTE — Telephone Encounter (Signed)
Called and lvm for patient call us back regarding lab results. ?

## 2021-05-05 NOTE — Telephone Encounter (Signed)
-----   Message from Judy Pimple, MD sent at 05/02/2021  4:13 PM EST ----- ?The TSH is close enough to normal- just stay on the dose you are taking and re check in 3 months please- if it goes up more we will change it  ?Work on diet for diabetes  ?Let us know when you want to start checking glucose  ?Re check a1c in 3 months  ?

## 2021-05-11 ENCOUNTER — Ambulatory Visit: Payer: Medicare PPO | Admitting: Gastroenterology

## 2021-05-11 ENCOUNTER — Encounter: Payer: Self-pay | Admitting: Gastroenterology

## 2021-05-11 VITALS — BP 122/70 | HR 94 | Ht 65.0 in | Wt 169.2 lb

## 2021-05-11 DIAGNOSIS — R053 Chronic cough: Secondary | ICD-10-CM

## 2021-05-11 DIAGNOSIS — K219 Gastro-esophageal reflux disease without esophagitis: Secondary | ICD-10-CM | POA: Diagnosis not present

## 2021-05-11 MED ORDER — FAMOTIDINE 20 MG PO TABS: ORAL_TABLET | ORAL | 6 refills | Status: DC

## 2021-05-11 MED ORDER — ESOMEPRAZOLE MAGNESIUM 40 MG PO CPDR: DELAYED_RELEASE_CAPSULE | ORAL | 6 refills | Status: DC

## 2021-05-11 NOTE — Patient Instructions (Addendum)
We have sent the following medications to your pharmacy for you to pick up at your convenience:  Nexium  Pepcid  ? ?Follow up in 1 year  ? ?If you are age 80 or older, your body mass index should be between 23-30. Your Body mass index is 28.16 kg/m?Marland Kitchen If this is out of the aforementioned range listed, please consider follow up with your Primary Care Provider. ? ?If you are age 104 or younger, your body mass index should be between 19-25. Your Body mass index is 28.16 kg/m?Marland Kitchen If this is out of the aformentioned range listed, please consider follow up with your Primary Care Provider.  ? ?________________________________________________________ ? ?The [facility] providers would like to encourage you to use [facility] to communicate with providers for non-urgent requests or questions.  Due to long hold times on the telephone, sending your provider a message by [facility] may be a faster and more efficient way to get a response.  Please allow 48 business hours for a response.  Please remember that this is for non-urgent requests.  ?_______________________________________________________  ? ?Thank you for choosing [facility] ? ?Kavitha Nandigam,MD  ?

## 2021-05-11 NOTE — Progress Notes (Signed)
? ?       ? ?Jordan HorsemanJean C Null    [id number]    1940-06-11 ? ?Primary Care Physician:Tower, Audrie GallusMarne A, MD ? ?Referring Physician: Tower, Audrie GallusMarne A, MD ?[address] [place] ?[place],  [place] [id number] ? ? ?Chief complaint:  GERD, chronic cough ? ?HPI: ? ?81 year old very pleasant female here for follow-up visit for chronic GERD and cough .  She is accompanied by her daughter ? ?She underwent kyphoplasty T8-9 s/p thoracic compression fracture in December 2022.  She is recovering well from it. ?  ?She is taking esomeprazole 40 mg twice daily and Pepcid at bedtime.  She continues to have intermittent breakthrough regurgitation and cough.  Overall her symptoms are stable.  No heartburn, dysphagia, odynophagia, nausea, vomiting, loss of appetite or weight loss.  Denies any change in bowel habits, melena or blood per rectum. ? ?Review of system positive for vaginal spotting, she has seen Dr. Ashley RoyaltyMatthews, was informed it was due to uterine prolapse causing mechanical irritation with prolapse rubbing against the cervix and causing ulceration leading to the spotting.  She is opting to monitor and manage it conservatively. ?  ?EGD Jun 25, 2011: Irregular Z line with biopsies negative for intestinal metaplasia or Barrett's esophagus.  Status post dilation with Maloney to 17 mm.  Otherwise normal exam ?  ?Upper GI series November 2012: Small sliding-type hiatal hernia with moderate inducible gastroesophageal reflux.  Mild smooth narrowing at the GE junction likely a lower esophageal mucosal ring.  30 mm barium pill passed into stomach without any difficulty.  Normal appearance of stomach and duodenum. ?  ?Esophageal manometry February 2019: Normal ?  ?24-hour pH impedance on PPI and H2 blocker February 2019: Good acid suppression with DeMeester score 7.2, very few gastroesophageal reflux events , were predominantly weekly acid reflux.  Good symptom correlation for regurgitation with reflux events but no correlation for cough. ?   ?Negative Cologuard April 18, 2015 ? ? ?Outpatient Encounter Medications as of 05/11/2021  ?Medication Sig  ? albuterol (VENTOLIN HFA) 108 (90 Base) MCG/ACT inhaler TAKE 2 PUFFS BY MOUTH EVERY 6 HOURS AS NEEDED FOR WHEEZE OR SHORTNESS OF BREATH  ? amLODipine (NORVASC) 5 MG tablet Take 1 tablet (5 mg total) by mouth daily.  ? aspirin 81 MG tablet Take 81 mg by mouth daily.  ? Budeson-Glycopyrrol-Formoterol (BREZTRI AEROSPHERE) 160-9-4.8 MCG/ACT AERO INHALE 2 PUFFS BY MOUTH TWICE A DAY  ? Calcium 250 MG CAPS Take 500 mg by mouth in the morning and at bedtime.  ? chlorpheniramine (CHLOR-TRIMETON) 4 MG tablet Take 4 mg by mouth in the morning, at noon, and at bedtime.  ? chlorthalidone (HYGROTON) 25 MG tablet Take 1 tablet (25 mg total) by mouth daily.  ? Cholecalciferol (VITAMIN D3) 1000 units CAPS Take 1 capsule (1,000 Units total) by mouth daily.  ? diphenhydramine-acetaminophen (TYLENOL PM) 25-500 MG TABS tablet Take 1 tablet by mouth at bedtime.  ? esomeprazole (NEXIUM) 40 MG capsule TAKE 1 CAPSULE BY MOUTH EVERY DAY BEFORE BREAKFAST  ? famotidine (PEPCID) 20 MG tablet TAKE 1 TABLET BY MOUTH EVERYDAY AT BEDTIME  ? ferrous sulfate 325 (65 FE) MG EC tablet Take 325 mg by mouth every Monday, Wednesday, and Friday.  ? Garlic (GARLIQUE PO) Take 1 tablet by mouth daily.  ? levothyroxine (SYNTHROID) 100 MCG tablet TAKE 1 TABLET BY MOUTH DAILY BEFORE BREAKFAST.  ? MAGNESIUM PO Take 2 tablets by mouth daily.  ? metFORMIN (GLUCOPHAGE) 500 MG tablet TAKE 1 TABLET BY MOUTH  2 TIMES DAILY WITH A MEAL.  ? montelukast (SINGULAIR) 10 MG tablet TAKE 1 TABLET BY MOUTH EVERYDAY AT BEDTIME  ? Multiple Vitamins-Minerals (VISION HEALTH PO) Take by mouth.  ? Nutritional Supplements (GLUCOSE MANAGEMENT PO) Take by mouth.  ? Omega-3 Fatty Acids (OMEGA 3 500 PO) Take 1 capsule by mouth in the morning and at bedtime.  ? polyethylene glycol (MIRALAX / GLYCOLAX) packet Take 17 g by mouth 2 (two) times a week.  ? Polyvinyl Alcohol-Povidone  (REFRESH OP) Place 1 drop into both eyes daily.   ? Specialty Vitamins Products (CARDIOVASCULAR SUPPORT PO) Take 2 capsules by mouth daily.  ? valsartan (DIOVAN) 320 MG tablet TAKE 1 TABLET (320 MG TOTAL) BY MOUTH DAILY. PLEASE KEEP UPCOMING APPT FOR FUTURE REFILLS.  ? [DISCONTINUED] methocarbamol (ROBAXIN) 500 MG tablet Take 1 tablet (500 mg total) by mouth every 8 (eight) hours as needed for muscle spasms. (Patient not taking: Reported on 05/11/2021)  ? [DISCONTINUED] traMADol (ULTRAM) 50 MG tablet Take by mouth. (Patient not taking: Reported on 05/11/2021)  ? ?No facility-administered encounter medications on file as of 05/11/2021.  ? ? ?Allergies as of 05/11/2021 - Review Complete 05/11/2021  ?Allergen Reaction Noted  ? Ace inhibitors Cough 05/19/2011  ? Crestor [rosuvastatin] Other (See Comments) 01/24/2014  ? Wellbutrin [bupropion hcl] Other (See Comments) 05/19/2011  ? Cymbalta [duloxetine hcl] Palpitations and Other (See Comments) 05/19/2011  ? Lipitor [atorvastatin] Cough 03/26/2016  ? Pregabalin Other (See Comments) 05/19/2011  ? Tegretol [carbamazepine] Other (See Comments) 05/19/2011  ? ? ?Past Medical History:  ?Diagnosis Date  ? Allergic rhinitis   ? Arthritis   ? hands and knees - ?osteo  ? Asthma   ? Chronic headaches   ? Compression fracture of T12 vertebra (HCC) 07/18/2014  ? S/p kyphoplasty by Dr Newell Coral (08/2014)   ? Depression   ? pt denies  ? Diabetes type 2, controlled (HCC) 2004  ? Environmental allergies   ? dust,mold,mildew  ? Extrinsic asthma   ? GERD (gastroesophageal reflux disease)   ? [name] EGD - mild esophageal dysmotility and small hiatal hernia  ? Hiatal hernia   ? Hx of migraines   ? Hyperlipidemia   ? mild, diet controlled  ? Hypertension   ? Hypothyroidism   ? Macular degeneration   ? Osteoporosis with fracture 07/2014  ? T -1.2 hip, T 0.2 spine, T12 compression fracture s/p kyphoplasty  ? ? ?Past Surgical History:  ?Procedure Laterality Date  ? 68 HOUR PH STUDY N/A 04/11/2017  ?  Procedure: 24 HOUR PH STUDY;  Surgeon: Napoleon Form, MD;  Location: [facility];  Service: Endoscopy;  Laterality: N/A;  ? BACK SURGERY    ? CATARACT EXTRACTION  02/22/2001  ? bilateral with lens implant  ? COLONOSCOPY    ? DEXA  05/23/2008  ? improved (initial DEXA 2007 - mild osteopenia)  ? DILATION AND CURETTAGE OF UTERUS  02/23/1976  ? ESOPHAGEAL MANOMETRY N/A 04/11/2017  ? Procedure: ESOPHAGEAL MANOMETRY (EM);  Surgeon: Napoleon Form, MD;  Location: [facility];  Service: Endoscopy;  Laterality: N/A;  ? ESOPHAGOGASTRODUODENOSCOPY  06/25/2011  ? Procedure: ESOPHAGOGASTRODUODENOSCOPY (EGD);  Surgeon: Hart Carwin, MD;  Location: [facility];  Service: Endoscopy;  Laterality: N/A;  no Xray  ? EYE SURGERY    ? KYPHOPLASTY N/A 08/30/2014  ? Procedure: THORACIC TWELVE KYPHOPLASTY;  Surgeon: [name], MD  ? KYPHOPLASTY N/A 01/22/2021  ? Procedure: Thoracic eight, Thoracic nine KYPHOPLASTY;  Surgeon: Coletta Memos, MD;  Location: [facility]  OR;  Service: Neurosurgery;  Laterality: N/A;  ? LAPAROSCOPY ABDOMEN DIAGNOSTIC    ? To R/O endometriosis  ? PH IMPEDANCE STUDY N/A 04/11/2017  ? Procedure: PH IMPEDANCE STUDY;  Surgeon: Napoleon Form, MD;  Location: [facility];  Service: Endoscopy;  Laterality: N/A;  ? SAVORY DILATION  06/25/2011  ? Procedure: SAVORY DILATION;  Surgeon: Hart Carwin, MD;  Location: [facility];  Service: Endoscopy;  Laterality: N/A;  ? TONSILLECTOMY AND ADENOIDECTOMY  02/23/1960  ? US ECHOCARDIOGRAPHY  10/24/2014  ? nl systolic function EF 55%, mild diastolic dysfunction  ? ? ?Family History  ?Problem Relation Age of Onset  ? Mitral valve prolapse Father   ? Diabetes Father   ? Mitral valve prolapse Mother   ? Hypertension Mother   ? Stroke Mother 64  ?     after valve surgery  ? Parkinson's disease Brother   ? Other Brother   ?     POST WAR TRAUMA  ? Stroke Paternal Grandfather   ? Heart failure Maternal Grandfather   ? Cancer Neg Hx   ? Colon cancer Neg Hx   ? Stomach  cancer Neg Hx   ? Esophageal cancer Neg Hx   ? Pancreatic cancer Neg Hx   ? Liver disease Neg Hx   ? ? ?Social History  ? ?Socioeconomic History  ? Marital status: Widowed  ?  Spouse name: Not on file  ? Number of

## 2021-05-19 DIAGNOSIS — S233XXA Sprain of ligaments of thoracic spine, initial encounter: Secondary | ICD-10-CM | POA: Diagnosis not present

## 2021-05-19 DIAGNOSIS — M9902 Segmental and somatic dysfunction of thoracic region: Secondary | ICD-10-CM | POA: Diagnosis not present

## 2021-05-24 ENCOUNTER — Other Ambulatory Visit: Payer: Self-pay | Admitting: Internal Medicine

## 2021-06-09 DIAGNOSIS — M9902 Segmental and somatic dysfunction of thoracic region: Secondary | ICD-10-CM | POA: Diagnosis not present

## 2021-06-09 DIAGNOSIS — S233XXA Sprain of ligaments of thoracic spine, initial encounter: Secondary | ICD-10-CM | POA: Diagnosis not present

## 2021-06-23 DIAGNOSIS — S233XXA Sprain of ligaments of thoracic spine, initial encounter: Secondary | ICD-10-CM | POA: Diagnosis not present

## 2021-06-23 DIAGNOSIS — M9902 Segmental and somatic dysfunction of thoracic region: Secondary | ICD-10-CM | POA: Diagnosis not present

## 2021-07-07 DIAGNOSIS — M9902 Segmental and somatic dysfunction of thoracic region: Secondary | ICD-10-CM | POA: Diagnosis not present

## 2021-07-07 DIAGNOSIS — S233XXA Sprain of ligaments of thoracic spine, initial encounter: Secondary | ICD-10-CM | POA: Diagnosis not present

## 2021-07-21 DIAGNOSIS — S233XXA Sprain of ligaments of thoracic spine, initial encounter: Secondary | ICD-10-CM | POA: Diagnosis not present

## 2021-07-21 DIAGNOSIS — M9902 Segmental and somatic dysfunction of thoracic region: Secondary | ICD-10-CM | POA: Diagnosis not present

## 2021-07-23 ENCOUNTER — Other Ambulatory Visit: Payer: Self-pay | Admitting: Family Medicine

## 2021-07-23 DIAGNOSIS — E039 Hypothyroidism, unspecified: Secondary | ICD-10-CM

## 2021-07-27 ENCOUNTER — Encounter: Payer: Self-pay | Admitting: Interventional Cardiology

## 2021-07-30 ENCOUNTER — Other Ambulatory Visit: Payer: Self-pay | Admitting: Internal Medicine

## 2021-08-04 DIAGNOSIS — M9902 Segmental and somatic dysfunction of thoracic region: Secondary | ICD-10-CM | POA: Diagnosis not present

## 2021-08-04 DIAGNOSIS — S233XXA Sprain of ligaments of thoracic spine, initial encounter: Secondary | ICD-10-CM | POA: Diagnosis not present

## 2021-08-13 ENCOUNTER — Other Ambulatory Visit: Payer: Self-pay | Admitting: Family Medicine

## 2021-08-13 ENCOUNTER — Ambulatory Visit: Payer: Medicare PPO | Admitting: Family Medicine

## 2021-08-13 ENCOUNTER — Encounter: Payer: Self-pay | Admitting: Family Medicine

## 2021-08-13 VITALS — BP 126/80 | HR 72 | Ht 65.0 in | Wt 168.4 lb

## 2021-08-13 DIAGNOSIS — E039 Hypothyroidism, unspecified: Secondary | ICD-10-CM

## 2021-08-13 DIAGNOSIS — N819 Female genital prolapse, unspecified: Secondary | ICD-10-CM

## 2021-08-13 DIAGNOSIS — M67449 Ganglion, unspecified hand: Secondary | ICD-10-CM

## 2021-08-13 DIAGNOSIS — E119 Type 2 diabetes mellitus without complications: Secondary | ICD-10-CM

## 2021-08-13 DIAGNOSIS — I10 Essential (primary) hypertension: Secondary | ICD-10-CM

## 2021-08-13 LAB — BASIC METABOLIC PANEL
BUN: 20 mg/dL (ref 6–23)
CO2: 29 mEq/L (ref 19–32)
Calcium: 9.5 mg/dL (ref 8.4–10.5)
Chloride: 93 mEq/L — ABNORMAL LOW (ref 96–112)
Creatinine, Ser: 0.84 mg/dL (ref 0.40–1.20)
GFR: 65.44 mL/min (ref >60.00)
Glucose, Bld: 143 mg/dL — ABNORMAL HIGH (ref 70–99)
Potassium: 4.1 mEq/L (ref 3.5–5.1)
Sodium: 131 mEq/L — ABNORMAL LOW (ref 135–145)

## 2021-08-13 LAB — HEMOGLOBIN A1C: Hgb A1c MFr Bld: 7.1 % — ABNORMAL HIGH (ref 4.6–6.5)

## 2021-08-13 LAB — TSH: TSH: 5.35 u[IU]/mL (ref 0.35–5.50)

## 2021-08-13 NOTE — Assessment & Plan Note (Signed)
Bump on distal pip of R 4th finger  Enc pt not to rupture it due to infection risk  Offered ref to hand specialist

## 2021-08-13 NOTE — Assessment & Plan Note (Signed)
Seeing uro gyn  Symptomatic but declines surgery

## 2021-08-13 NOTE — Patient Instructions (Signed)
Let me know when you want to see a hand specialist for the bump on your finger   Eat a low glycemic diet  Try to get most of your carbohydrates from produce (with the exception of white potatoes)  Eat less bread/pasta/rice/snack foods/cereals/sweets and other items from the middle of the grocery store (processed carbs) Keep walking   Labs today   Take care of yourself

## 2021-08-13 NOTE — Progress Notes (Signed)
Subjective:    Patient ID: Jordan Patterson, female    DOB: Oct 25, 1940, 81 y.o.   MRN: [id number]  HPI Pt presents for f/u of DM2 and HTN, hypothyroidism and chronic medical problems  Wt Readings from Last 3 Encounters:  08/13/21 168 lb 6.4 oz (76.4 kg)  05/11/21 169 lb 3.2 oz (76.7 kg)  04/07/21 164 lb 3.2 oz (74.5 kg)   28.02 kg/m  Doing ok overall   Her vaginal prolapse surgery is worse  Not considering surgery however (at her age she declines)  HTN bp is stable today  No cp or palpitations or headaches or edema  No side effects to medicines  BP Readings from Last 3 Encounters:  08/13/21 126/80  05/11/21 122/70  04/07/21 140/80     Diovan 320 mg daily  Amlodipine 5 mg daily  Chlorthalidone 25 mg daily    Hypothyroidism  Pt has no clinical changes No change in energy level/ hair or skin/ edema and no tremor Lab Results  Component Value Date   TSH 5.70 (H) 04/30/2021    Levothyroxine 100 mcg daily   DM2 Lab Results  Component Value Date   HGBA1C 7.1 (H) 04/30/2021   She hopes it is better =doing a lot of walking now  Metformin 500 mg bid  Diet : avoids junk food  Does try to avoid the processed carbs when she can  Discussed glucose monitoring -not interested yet  Eye exam -nov  Has hypertensive retinopathy   No symptoms of hypoglycemia   Takes arb Intol to statins  Lab Results  Component Value Date   CREATININE 0.79 04/30/2021   BUN 16 04/30/2021   NA 130 (L) 04/30/2021   K 4.1 04/30/2021   CL 90 (L) 04/30/2021   CO2 31 04/30/2021  GFR 70.5   Sees gyn Had some bleeding from a small cervical ulceration  [place] was nl in feb   Bump on R 4th finger/distal joint  Occ she drains it   Patient Active Problem List   Diagnosis Date Noted   Digital mucinous cyst of finger 08/13/2021   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   Vaginal spotting 04/07/2021   Estrogen deficiency 04/07/2021   Thoracic compression fracture (HCC) 01/22/2021   Compression fracture  of T11 vertebra (HCC) 12/15/2020   Current use of proton pump inhibitor 03/11/2020   Chronic midline low back pain without sciatica 03/12/2019   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   Hyponatremia 12/14/2018   Laryngopharyngeal reflux (LPR) 08/24/2017   Osteoporosis 08/24/2017   Leg cramping 08/21/2017   Regurgitation of food    Prolapse of female pelvic organs 02/19/2017   History of compression fracture of spine 07/18/2014   Hypertensive retinopathy of both eyes 06/04/2014   Nonexudative age-related macular degeneration 06/04/2014   Posterior vitreous detachment of both eyes 06/04/2014   Pseudophakia of both eyes 06/04/2014   Right knee pain 05/30/2014   Advanced care planning/counseling discussion 01/24/2014   Health maintenance examination 01/24/2014   Dysgeusia 11/12/2013   Upper airway cough syndrome 03/19/2013   Allergic conjunctivitis and rhinitis 05/15/2012   Asthma, moderate persistent 03/09/2012   Medicare annual wellness visit, subsequent 01/14/2012   Hiatal hernia    GERD (gastroesophageal reflux disease)    Essential hypertension    Hyperlipidemia associated with type 2 diabetes mellitus (HCC)    Depression    Hypothyroidism    Diabetes type 2, controlled (HCC)    Osteoarthritis    Past Medical History:  Diagnosis Date   Allergic rhinitis  Arthritis    hands and knees - ?osteo   Asthma    Chronic headaches    Compression fracture of T12 vertebra (HCC) 07/18/2014   S/p kyphoplasty by Dr Newell Coral (08/2014)    Depression    pt denies   Diabetes type 2, controlled (HCC) 2004   Environmental allergies    dust,mold,mildew   Extrinsic asthma    GERD (gastroesophageal reflux disease)    [name] EGD - mild esophageal dysmotility and small hiatal hernia   Hiatal hernia    Hx of migraines    Hyperlipidemia    mild, diet controlled   Hypertension    Hypothyroidism    Macular degeneration    Osteoporosis with fracture 07/2014   T -1.2 hip, T 0.2 spine, T12  compression fracture s/p kyphoplasty   Past Surgical History:  Procedure Laterality Date   77 HOUR PH STUDY N/A 04/11/2017   Procedure: 24 HOUR PH STUDY;  Surgeon: Napoleon Form, MD;  Location: [facility];  Service: Endoscopy;  Laterality: N/A;   BACK SURGERY     CATARACT EXTRACTION  02/22/2001   bilateral with lens implant   COLONOSCOPY     DEXA  05/23/2008   improved (initial DEXA 2007 - mild osteopenia)   DILATION AND CURETTAGE OF UTERUS  02/23/1976   ESOPHAGEAL MANOMETRY N/A 04/11/2017   Procedure: ESOPHAGEAL MANOMETRY (EM);  Surgeon: Napoleon Form, MD;  Location: [facility];  Service: Endoscopy;  Laterality: N/A;   ESOPHAGOGASTRODUODENOSCOPY  06/25/2011   Procedure: ESOPHAGOGASTRODUODENOSCOPY (EGD);  Surgeon: Hart Carwin, MD;  Location: [facility];  Service: Endoscopy;  Laterality: N/A;  no Xray   EYE SURGERY     KYPHOPLASTY N/A 08/30/2014   Procedure: THORACIC TWELVE KYPHOPLASTY;  Surgeon: [name], MD   KYPHOPLASTY N/A 01/22/2021   Procedure: Thoracic eight, Thoracic nine KYPHOPLASTY;  Surgeon: Coletta Memos, MD;  [facility];  Service: Neurosurgery;  Laterality: N/A;   LAPAROSCOPY ABDOMEN DIAGNOSTIC     To R/O endometriosis   PH IMPEDANCE STUDY N/A 04/11/2017   Procedure: PH IMPEDANCE STUDY;  Surgeon: Napoleon Form, MD;  Location: [facility];  Service: Endoscopy;  Laterality: N/A;   SAVORY DILATION  06/25/2011   Procedure: SAVORY DILATION;  Surgeon: Hart Carwin, MD;  Location: [facility];  Service: Endoscopy;  Laterality: N/A;   TONSILLECTOMY AND ADENOIDECTOMY  02/23/1960   US ECHOCARDIOGRAPHY  10/24/2014   nl systolic function EF 55%, mild diastolic dysfunction   Social History   Tobacco Use   Smoking status: Never   Smokeless tobacco: Never  Vaping Use   Vaping Use: Never used  Substance Use Topics   Alcohol use: No    Alcohol/week: 0.0 standard drinks of alcohol   Drug use: No   Family History  Problem Relation Age of  Onset   Mitral valve prolapse Father    Diabetes Father    Mitral valve prolapse Mother    Hypertension Mother    Stroke Mother 12       after valve surgery   Parkinson's disease Brother    Other Brother        POST WAR TRAUMA   Stroke Paternal Grandfather    Heart failure Maternal Grandfather    Cancer Neg Hx    Colon cancer Neg Hx    Stomach cancer Neg Hx    Esophageal cancer Neg Hx    Pancreatic cancer Neg Hx    Liver disease Neg Hx    Allergies  Allergen Reactions  Ace Inhibitors Cough   Crestor [Rosuvastatin] Other (See Comments)    myalgias   Wellbutrin [Bupropion Hcl] Other (See Comments)    Unknown reaction   Cymbalta [Duloxetine Hcl] Palpitations and Other (See Comments)    headaches   Lipitor [Atorvastatin] Cough   Pregabalin Other (See Comments)    Unknown reaction   Tegretol [Carbamazepine] Other (See Comments)    Dizziness, headache   Current Outpatient Medications on File Prior to Visit  Medication Sig Dispense Refill   albuterol (VENTOLIN HFA) 108 (90 Base) MCG/ACT inhaler TAKE 2 PUFFS BY MOUTH EVERY 6 HOURS AS NEEDED FOR WHEEZE OR SHORTNESS OF BREATH 18 g 11   amLODipine (NORVASC) 5 MG tablet Take 1 tablet (5 mg total) by mouth daily. 90 tablet 3   aspirin 81 MG tablet Take 81 mg by mouth daily.     Budeson-Glycopyrrol-Formoterol (BREZTRI AEROSPHERE) 160-9-4.8 MCG/ACT AERO TAKE 2 PUFFS BY MOUTH TWICE A DAY 32.1 g 3   Calcium 250 MG CAPS Take 500 mg by mouth in the morning and at bedtime.     chlorpheniramine (CHLOR-TRIMETON) 4 MG tablet Take 4 mg by mouth in the morning, at noon, and at bedtime.     chlorthalidone (HYGROTON) 25 MG tablet Take 1 tablet (25 mg total) by mouth daily. 90 tablet 1   Cholecalciferol (VITAMIN D3) 1000 units CAPS Take 1 capsule (1,000 Units total) by mouth daily. 30 capsule    diphenhydramine-acetaminophen (TYLENOL PM) 25-500 MG TABS tablet Take 1 tablet by mouth at bedtime.     esomeprazole (NEXIUM) 40 MG capsule 1 capsule by  mouth every day before breakfast 90 capsule 6   famotidine (PEPCID) 20 MG tablet TAKE 1 TABLET BY MOUTH EVERYDAY AT BEDTIME 90 tablet 6   ferrous sulfate 325 (65 FE) MG EC tablet Take 325 mg by mouth every Monday, Wednesday, and Friday.     Garlic (GARLIQUE PO) Take 1 tablet by mouth daily.     levothyroxine (SYNTHROID) 100 MCG tablet TAKE 1 TABLET BY MOUTH EVERY DAY BEFORE BREAKFAST 90 tablet 0   MAGNESIUM PO Take 2 tablets by mouth daily.     montelukast (SINGULAIR) 10 MG tablet TAKE 1 TABLET BY MOUTH EVERYDAY AT BEDTIME 90 tablet 1   Multiple Vitamins-Minerals (VISION HEALTH PO) Take by mouth.     Nutritional Supplements (GLUCOSE MANAGEMENT PO) Take by mouth.     Omega-3 Fatty Acids (OMEGA 3 500 PO) Take 1 capsule by mouth in the morning and at bedtime.     polyethylene glycol (MIRALAX / GLYCOLAX) packet Take 17 g by mouth 2 (two) times a week.     Polyvinyl Alcohol-Povidone (REFRESH OP) Place 1 drop into both eyes daily.      Specialty Vitamins Products (CARDIOVASCULAR SUPPORT PO) Take 2 capsules by mouth daily.     valsartan (DIOVAN) 320 MG tablet TAKE 1 TABLET (320 MG TOTAL) BY MOUTH DAILY. PLEASE KEEP UPCOMING APPT FOR FUTURE REFILLS. 90 tablet 3   No current facility-administered medications on file prior to visit.     Review of Systems  Constitutional:  Positive for fatigue. Negative for activity change, appetite change, fever and unexpected weight change.  HENT:  Negative for congestion, ear pain, rhinorrhea, sinus pressure and sore throat.   Eyes:  Negative for pain, redness and visual disturbance.  Respiratory:  Negative for cough, shortness of breath and wheezing.   Cardiovascular:  Negative for chest pain and palpitations.  Gastrointestinal:  Negative for abdominal pain, blood in stool, constipation  and diarrhea.  Endocrine: Negative for polydipsia and polyuria.  Genitourinary:  Negative for dysuria, frequency and urgency.  Musculoskeletal:  Positive for arthralgias and back  pain. Negative for myalgias.  Skin:  Negative for pallor and rash.  Allergic/Immunologic: Negative for environmental allergies.  Neurological:  Negative for dizziness, syncope and headaches.  Hematological:  Negative for adenopathy. Does not bruise/bleed easily.  Psychiatric/Behavioral:  Negative for decreased concentration and dysphoric mood. The patient is not nervous/anxious.        Objective:   Physical Exam Constitutional:      General: She is not in acute distress.    Appearance: Normal appearance. She is well-developed and normal weight. She is not ill-appearing or diaphoretic.  HENT:     Head: Normocephalic and atraumatic.  Eyes:     Conjunctiva/sclera: Conjunctivae normal.     Pupils: Pupils are equal, round, and reactive to light.  Neck:     Thyroid: No thyromegaly.     Vascular: No carotid bruit or JVD.  Cardiovascular:     Rate and Rhythm: Normal rate and regular rhythm.     Heart sounds: Normal heart sounds.     No gallop.  Pulmonary:     Effort: Pulmonary effort is normal. No respiratory distress.     Breath sounds: Normal breath sounds. No wheezing or rales.  Abdominal:     General: There is no distension or abdominal bruit.     Palpations: Abdomen is soft.  Musculoskeletal:     Cervical back: Normal range of motion and neck supple.     Right lower leg: No edema.     Left lower leg: No edema.  Lymphadenopathy:     Cervical: No cervical adenopathy.  Skin:    General: Skin is warm and dry.     Coloration: Skin is not pale.     Findings: No rash.     Comments: Small bump on R 4th finger over distal pip joint resembles mucous cyst  Neurological:     Mental Status: She is alert.     Coordination: Coordination normal.     Deep Tendon Reflexes: Reflexes are normal and symmetric. Reflexes normal.  Psychiatric:        Mood and Affect: Mood normal.           Assessment & Plan:   Problem List Items Addressed This Visit       Cardiovascular and  Mediastinum   Essential hypertension    bp in fair control at this time  BP Readings from Last 1 Encounters:  08/13/21 126/80  No changes needed Most recent labs reviewed  Disc lifstyle change with low sodium diet and exercise  Diovan 320 mg daily  Amlodipine 5 mg daily  Chlorthalidone 25 mg daily       Relevant Orders   Basic metabolic panel (Completed)     Endocrine   Diabetes type 2, controlled (HCC) - Primary    Due for A1c Metformin 500 mg bid/tolerates well  She is not interested in glucose monitoring yet Eye exam utd  Takes arb  Intol of statins  More active and eating better      Relevant Orders   Hemoglobin A1c (Completed)   Hypothyroidism    Hypothyroidism  Pt has no clinical changes No change in energy level/ hair or skin/ edema and no tremor Last TSH slt high but  choose not to change px  Taking levothyroxine 100 mcg daily  TSH orderd  Relevant Orders   TSH (Completed)     Genitourinary   Prolapse of female pelvic organs    Seeing uro gyn  Symptomatic but declines surgery         Other   Digital mucinous cyst of finger    Bump on distal pip of R 4th finger  Enc pt not to rupture it due to infection risk  Offered ref to hand specialist

## 2021-08-13 NOTE — Assessment & Plan Note (Signed)
Due for A1c Metformin 500 mg bid/tolerates well  She is not interested in glucose monitoring yet Eye exam utd  Takes arb  Intol of statins  More active and eating better

## 2021-08-13 NOTE — Assessment & Plan Note (Addendum)
Hypothyroidism  Pt has no clinical changes No change in energy level/ hair or skin/ edema and no tremor Last TSH slt high but  choose not to change px  Taking levothyroxine 100 mcg daily  TSH orderd

## 2021-09-01 DIAGNOSIS — S233XXA Sprain of ligaments of thoracic spine, initial encounter: Secondary | ICD-10-CM | POA: Diagnosis not present

## 2021-09-01 DIAGNOSIS — M9902 Segmental and somatic dysfunction of thoracic region: Secondary | ICD-10-CM | POA: Diagnosis not present

## 2021-09-04 ENCOUNTER — Ambulatory Visit
Admission: RE | Admit: 2021-09-04 | Discharge: 2021-09-04 | Disposition: A | Discharge status: Home / Self Care | Payer: Medicare PPO | Source: Ambulatory Visit | Attending: Family Medicine | Admitting: Family Medicine

## 2021-09-04 DIAGNOSIS — E2839 Other primary ovarian failure: Secondary | ICD-10-CM

## 2021-09-04 DIAGNOSIS — M8589 Other specified disorders of bone density and structure, multiple sites: Secondary | ICD-10-CM | POA: Diagnosis not present

## 2021-09-04 DIAGNOSIS — Z780 Asymptomatic menopausal state: Secondary | ICD-10-CM | POA: Diagnosis not present

## 2021-09-15 DIAGNOSIS — S233XXA Sprain of ligaments of thoracic spine, initial encounter: Secondary | ICD-10-CM | POA: Diagnosis not present

## 2021-09-15 DIAGNOSIS — M9902 Segmental and somatic dysfunction of thoracic region: Secondary | ICD-10-CM | POA: Diagnosis not present

## 2021-09-28 DIAGNOSIS — M9902 Segmental and somatic dysfunction of thoracic region: Secondary | ICD-10-CM | POA: Diagnosis not present

## 2021-09-28 DIAGNOSIS — S233XXA Sprain of ligaments of thoracic spine, initial encounter: Secondary | ICD-10-CM | POA: Diagnosis not present

## 2021-10-12 DIAGNOSIS — M9902 Segmental and somatic dysfunction of thoracic region: Secondary | ICD-10-CM | POA: Diagnosis not present

## 2021-10-12 DIAGNOSIS — S233XXA Sprain of ligaments of thoracic spine, initial encounter: Secondary | ICD-10-CM | POA: Diagnosis not present

## 2021-10-14 ENCOUNTER — Other Ambulatory Visit: Payer: Self-pay | Admitting: Interventional Cardiology

## 2021-10-15 DIAGNOSIS — N398 Other specified disorders of urinary system: Secondary | ICD-10-CM | POA: Diagnosis not present

## 2021-10-15 DIAGNOSIS — N3946 Mixed incontinence: Secondary | ICD-10-CM | POA: Diagnosis not present

## 2021-10-15 DIAGNOSIS — N813 Complete uterovaginal prolapse: Secondary | ICD-10-CM | POA: Diagnosis not present

## 2021-10-20 ENCOUNTER — Other Ambulatory Visit: Payer: Self-pay | Admitting: Family Medicine

## 2021-10-20 DIAGNOSIS — E039 Hypothyroidism, unspecified: Secondary | ICD-10-CM

## 2021-10-29 ENCOUNTER — Telehealth: Payer: Self-pay | Admitting: Internal Medicine

## 2021-10-29 NOTE — Telephone Encounter (Signed)
Patient called to schedule annual visit with CY- states she started having congestion yesterday that concerned her. She wants to know what she can take due to blood pressure meds.  Patient denied OV with NP. Please call back at [phone].

## 2021-10-29 NOTE — Telephone Encounter (Signed)
Any of those would be fine. Just avoid anything that says -D, like Mucinex-D, because that indicates decongestant Sudafed, which can raise BP.  If it says -DM, that's ok. That's dextromethorphan, which is for cough.

## 2021-10-29 NOTE — Telephone Encounter (Signed)
Called and spoke with patient.  Patient stated she started having a some chest congestion yesterday.  Patient requested the name for the OTC cold med recommended for people on BP meds.  I advised her Coricidin was a OTC cold medication that is available for people on BP medications.   Patient wanted to know if Dr. Maple Hudson thought she should take Mucinex?  Patient stated her daughter is taking Muxinex max strength.  Patient wanted to know if she could take Mucinex or Mucinex max strength with her issues?   Message routed to Dr. Maple Hudson to advise

## 2021-10-29 NOTE — Telephone Encounter (Signed)
Called and spoke with patient. Dr Young's recommendations given. Understanding stated.  Nothing further at this time. 

## 2021-10-30 ENCOUNTER — Telehealth: Payer: Self-pay | Admitting: *Deleted

## 2021-10-30 NOTE — Telephone Encounter (Signed)
   Pre-operative Risk Assessment    Patient Name: Jordan Patterson  DOB: Sep 10, 1940 MRN: [id number]      Request for Surgical Clearance    Procedure:   LEFORT PROCEDURE  Date of Surgery:  Clearance 12/18/21                                 Surgeon:  Santina Evans MATTHEWS Surgeon's Group or Practice Name:  [facility] Phone number:  [phone] Fax number:  [phone]   Type of Clearance Requested:   - Medical  - Pharmacy:  Hold Aspirin ON FORM IT SAYS, 3,5,7, OR 10.Marland Kitchen WHICHEVER YOU RECOMMEND    Type of Anesthesia:  Not Indicated   Additional requests/questions:    Signed, Elliot Cousin   10/30/2021, 7:35 AM

## 2021-10-30 NOTE — Telephone Encounter (Signed)
Left message to schedule a sooner appt with Dr. Katrinka Blazing or APP for pre op clearance. Appt is to be IN OFFICE

## 2021-10-30 NOTE — Telephone Encounter (Signed)
   Name: Jordan Patterson  DOB: Jul 17, 1940  MRN: [id number]  Primary Cardiologist: Lesleigh Noe, MD  Chart reviewed as part of pre-operative protocol coverage. Because of Jordan Patterson past medical history and time since last visit, she will require a follow-up in-office visit in order to better assess preoperative cardiovascular risk.  Pre-op covering staff: - Please schedule appointment and call patient to inform them. If patient already had an upcoming appointment within acceptable timeframe, please add "pre-op clearance" to the appointment notes so provider is aware. - Please contact requesting surgeon's office via preferred method (i.e, phone, fax) to inform them of need for appointment prior to surgery.  Per our office protocol, okay to hold aspirin 7 days prior to the procedure.  Please resume when medically safe to do so.  Sharlene Dory, PA-C  10/30/2021, 8:38 AM

## 2021-11-02 NOTE — Telephone Encounter (Signed)
Left message x 2 for the pt to call back for a sooner appt for pre op clearance. Pt can keep her Dec 2023 appt with Dr. Katrinka Blazing, but does need a sooner appt for pre op clearance, in offfice.

## 2021-11-03 ENCOUNTER — Telehealth: Payer: Self-pay | Admitting: Family Medicine

## 2021-11-03 NOTE — Telephone Encounter (Signed)
Left message x 3 to call the office for IN OFFICE pre op appt. Pt can keep he appt with Dr. Katrinka Blazing in 01/2022, though needs a sooner in office for pre op clearance with an APP.   Will  update the requesting office the pt needs a sooner appt in office for pre op clearance. Pt has not returned our calls.   Will send the pt a letter. Will remove from pre op call back at this time.

## 2021-11-03 NOTE — Telephone Encounter (Signed)
Patient called in stating that she had a bad allergy attack and think it may have affected her lungs. She stated that when she cough it isn't breaking up all the way. She has been taking medication for the allergies and it helped for a few days for the runny nose. She just wanted to make Dr. Milinda Antis aware. Thank you!

## 2021-11-03 NOTE — Telephone Encounter (Signed)
Thanks for the heads up  Please schedule appt with first available (or if she prefers, her pulmonologist)  Thanks Agree with ER parameters

## 2021-11-03 NOTE — Telephone Encounter (Signed)
Patient called back in to follow up. Informed of the message below, and she stated she will contact her pulmonologist. Thank you!

## 2021-11-04 ENCOUNTER — Ambulatory Visit: Payer: Medicare PPO | Admitting: Family Medicine

## 2021-11-04 ENCOUNTER — Encounter: Payer: Self-pay | Admitting: Family Medicine

## 2021-11-04 ENCOUNTER — Ambulatory Visit (INDEPENDENT_AMBULATORY_CARE_PROVIDER_SITE_OTHER)
Admission: RE | Admit: 2021-11-04 | Discharge: 2021-11-04 | Disposition: A | Discharge status: Home / Self Care | Payer: Medicare PPO | Source: Ambulatory Visit | Attending: Family Medicine | Admitting: Family Medicine

## 2021-11-04 VITALS — BP 126/92 | HR 80 | Temp 97.6°F | Ht 65.0 in | Wt 168.5 lb

## 2021-11-04 DIAGNOSIS — J4540 Moderate persistent asthma, uncomplicated: Secondary | ICD-10-CM

## 2021-11-04 DIAGNOSIS — R059 Cough, unspecified: Secondary | ICD-10-CM | POA: Insufficient documentation

## 2021-11-04 DIAGNOSIS — R051 Acute cough: Secondary | ICD-10-CM

## 2021-11-04 MED ORDER — PREDNISONE 10 MG PO TABS: ORAL_TABLET | ORAL | 0 refills | Status: DC

## 2021-11-04 NOTE — Assessment & Plan Note (Addendum)
Some wheezing with this episode of allergies vs uri reviewed prior pulmonary notes and studies in setting of asthma history(Dr Young) and did medication review today  Pulse ox 90 % now (not sob on exam)  cxr ordered  Prednisone 40 mg taper px   Plan to continue singulair and breztri /albuterol  ER precautions noted Planning f/u with pulmonary

## 2021-11-04 NOTE — Assessment & Plan Note (Signed)
Pt thinks this was from allergies after working in dust and outdoors  Also sneezing/runny nose and no fever  Some chest congestion now  Scant wheeze/few rhonchi in setting of asthma history  Prednisone px cxr consistent with bronchitis or asthma  ER parameters discussed Update if not starting to improve in a week or if worsening   She is planning pulmonary f/u

## 2021-11-04 NOTE — Patient Instructions (Addendum)
Take prednisone as directed for asthma and bronchitis   Continue your current medicines   The mucinex is ok for chest and head congestion  Drink fluids   Let's check a chest xray today  We will call you with the result   Get an appt with Dr Maple Hudson scheduled before your surgery   If symptoms get severe go to the ER  If any fever or new symptoms let us know   Update if not starting to improve in a week or if worsening

## 2021-11-04 NOTE — Progress Notes (Signed)
Subjective:    Patient ID: BRIGHID KOCH, female    DOB: Apr 07, 1940, 81 y.o.   MRN: [id number]  HPI Pt presents for asthma/cough  Wt Readings from Last 3 Encounters:  11/04/21 168 lb 8 oz (76.4 kg)  08/13/21 168 lb 6.4 oz (76.4 kg)  05/11/21 169 lb 3.2 oz (76.7 kg)   28.04 kg/m  7 days ago -increased sneezing and clear mucous from nose- very runny  Thinks this started as an allergy attack (ragweed-had been outside and then working in the house)  Highly allergic to dust especially   Then went to her lungs   Very stuffy in nose  Lung congestion  Some wheezing-not too bad  Cough - is not productive Not tight   A little pain in both ears  Throat is ok  Not hoarse   No fever    Has not been in public   She sees Dr Maple Hudson for pulmonary    H/o multifactorial chronic cough and reactive airways  Also nasal allergies  Also GERD  Meds Singulair- 10 mg daily   Nexium-for GERD   Breztri inhaler- uses Albuterol inhaler - uses prn- twice daily  Chlorpheniramine -over the weekend   Got some mucinex (plain)   Cxr DG Chest 2 View  Result Date: 11/04/2021 CLINICAL DATA:  Cough with chest congestion. EXAM: CHEST - 2 VIEW COMPARISON:  12/14/2018 FINDINGS: Heart size is normal. Tortuous aorta as seen previously. Central bronchial thickening but no infiltrate, collapse or effusion. Old spinal fractures with augmentation in 3 locations. IMPRESSION: Central bronchial thickening which could be due to bronchitis or asthma. No consolidation or collapse. Electronically Signed   By: Paulina Fusi M.D.   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   Hyponatremia 12/14/2018   Laryngopharyngeal reflux (LPR) 08/24/2017   Osteoporosis 08/24/2017   Leg cramping 08/21/2017   Regurgitation of food    Prolapse of female pelvic organs 02/19/2017   History of compression fracture of spine 07/18/2014   Hypertensive retinopathy of both eyes 06/04/2014   Nonexudative age-related macular degeneration 06/04/2014   Posterior vitreous detachment of both eyes 06/04/2014   Pseudophakia of both eyes 06/04/2014   Right knee pain 05/30/2014   Advanced care planning/counseling discussion 01/24/2014   Health maintenance examination 01/24/2014   Dysgeusia 11/12/2013   Upper airway cough syndrome 03/19/2013   Allergic conjunctivitis and rhinitis 05/15/2012   Asthma, moderate persistent 03/09/2012   Medicare annual wellness visit, subsequent 01/14/2012   Hiatal hernia    GERD (gastroesophageal reflux disease)    Essential hypertension    Hyperlipidemia associated with type 2 diabetes mellitus (HCC)    Depression    Hypothyroidism    Diabetes type 2, controlled (HCC)    Osteoarthritis    Past Medical History:  Diagnosis Date   Allergic rhinitis    Arthritis    hands and knees - ?osteo   Asthma    Chronic headaches    Compression fracture of T12 vertebra (HCC) 07/18/2014   S/p kyphoplasty by Dr Newell Coral (08/2014)  Depression    pt denies   Diabetes type 2, controlled (HCC) 2004   Environmental allergies    dust,mold,mildew   Extrinsic asthma    GERD (gastroesophageal reflux disease)    [name] EGD - mild esophageal dysmotility and small hiatal hernia   Hiatal hernia    Hx of migraines    Hyperlipidemia    mild, diet controlled   Hypertension    Hypothyroidism    Macular degeneration    Osteoporosis with fracture 07/2014   T -1.2 hip, T 0.2 spine, T12 compression fracture s/p kyphoplasty   Past Surgical History:  Procedure  Laterality Date   56 HOUR PH STUDY N/A 04/11/2017   Procedure: 24 HOUR PH STUDY;  Surgeon: Napoleon Form, MD;  Location: [facility];  Service: Endoscopy;  Laterality: N/A;   BACK SURGERY     CATARACT EXTRACTION  02/22/2001   bilateral with lens implant   COLONOSCOPY     DEXA  05/23/2008   improved (initial DEXA 2007 - mild osteopenia)   DILATION AND CURETTAGE OF UTERUS  02/23/1976   ESOPHAGEAL MANOMETRY N/A 04/11/2017   Procedure: ESOPHAGEAL MANOMETRY (EM);  Surgeon: Napoleon Form, MD;  Location: [facility];  Service: Endoscopy;  Laterality: N/A;   ESOPHAGOGASTRODUODENOSCOPY  06/25/2011   Procedure: ESOPHAGOGASTRODUODENOSCOPY (EGD);  Surgeon: Hart Carwin, MD;  Location: [facility];  Service: Endoscopy;  Laterality: N/A;  no Xray   EYE SURGERY     KYPHOPLASTY N/A 08/30/2014   Procedure: THORACIC TWELVE KYPHOPLASTY;  Surgeon: [name], MD   KYPHOPLASTY N/A 01/22/2021   Procedure: Thoracic eight, Thoracic nine KYPHOPLASTY;  Surgeon: Coletta Memos, MD;  Location: [facility] OR;  Service: Neurosurgery;  Laterality: N/A;   LAPAROSCOPY ABDOMEN DIAGNOSTIC     To R/O endometriosis   PH IMPEDANCE STUDY N/A 04/11/2017   Procedure: PH IMPEDANCE STUDY;  Surgeon: Napoleon Form, MD;  Location: [facility];  Service: Endoscopy;  Laterality: N/A;   SAVORY DILATION  06/25/2011   Procedure: SAVORY DILATION;  Surgeon: Hart Carwin, MD;  Location: [facility];  Service: Endoscopy;  Laterality: N/A;   TONSILLECTOMY AND ADENOIDECTOMY  02/23/1960   US ECHOCARDIOGRAPHY  10/24/2014   nl systolic function EF 55%, mild diastolic dysfunction   Social History   Tobacco Use   Smoking status: Never   Smokeless tobacco: Never  Vaping Use   Vaping Use: Never used  Substance Use Topics   Alcohol use: No    Alcohol/week: 0.0 standard drinks of alcohol   Drug use: No   Family History  Problem Relation Age of Onset   Mitral valve prolapse Father    Diabetes Father    Mitral valve  prolapse Mother    Hypertension Mother    Stroke Mother 52       after valve surgery   Parkinson's disease Brother    Other Brother        POST WAR TRAUMA   Stroke Paternal Grandfather    Heart failure Maternal Grandfather    Cancer Neg Hx    Colon cancer Neg Hx    Stomach cancer Neg Hx    Esophageal cancer Neg Hx    Pancreatic cancer Neg Hx    Liver disease Neg Hx    Allergies  Allergen Reactions   Ace Inhibitors Cough   Crestor [Rosuvastatin] Other (See Comments)    myalgias   Wellbutrin [Bupropion Hcl] Other (See Comments)    Unknown reaction   Cymbalta [Duloxetine Hcl] Palpitations and Other (See  Comments)    headaches   Lipitor [Atorvastatin] Cough   Pregabalin Other (See Comments)    Unknown reaction   Tegretol [Carbamazepine] Other (See Comments)    Dizziness, headache   Current Outpatient Medications on File Prior to Visit  Medication Sig Dispense Refill   albuterol (VENTOLIN HFA) 108 (90 Base) MCG/ACT inhaler TAKE 2 PUFFS BY MOUTH EVERY 6 HOURS AS NEEDED FOR WHEEZE OR SHORTNESS OF BREATH 18 g 11   amLODipine (NORVASC) 5 MG tablet Take 1 tablet (5 mg total) by mouth daily. 30 tablet 3   aspirin 81 MG tablet Take 81 mg by mouth daily.     Budeson-Glycopyrrol-Formoterol (BREZTRI AEROSPHERE) 160-9-4.8 MCG/ACT AERO TAKE 2 PUFFS BY MOUTH TWICE A DAY 32.1 g 3   Calcium 250 MG CAPS Take 500 mg by mouth in the morning and at bedtime.     chlorpheniramine (CHLOR-TRIMETON) 4 MG tablet Take 4 mg by mouth in the morning, at noon, and at bedtime.     chlorthalidone (HYGROTON) 25 MG tablet Take 1 tablet (25 mg total) by mouth daily. 90 tablet 1   Cholecalciferol (VITAMIN D3) 1000 units CAPS Take 1 capsule (1,000 Units total) by mouth daily. 30 capsule    diphenhydramine-acetaminophen (TYLENOL PM) 25-500 MG TABS tablet Take 1 tablet by mouth at bedtime.     esomeprazole (NEXIUM) 40 MG capsule 1 capsule by mouth every day before breakfast 90 capsule 6   famotidine (PEPCID) 20 MG  tablet TAKE 1 TABLET BY MOUTH EVERYDAY AT BEDTIME 90 tablet 6   ferrous sulfate 325 (65 FE) MG EC tablet Take 325 mg by mouth every Monday, Wednesday, and Friday.     Garlic (GARLIQUE PO) Take 1 tablet by mouth daily.     levothyroxine (SYNTHROID) 100 MCG tablet TAKE 1 TABLET BY MOUTH EVERY DAY BEFORE BREAKFAST 90 tablet 2   MAGNESIUM PO Take 2 tablets by mouth daily.     metFORMIN (GLUCOPHAGE) 500 MG tablet TAKE 1 TABLET BY MOUTH 2 TIMES DAILY WITH A MEAL. 180 tablet 1   montelukast (SINGULAIR) 10 MG tablet TAKE 1 TABLET BY MOUTH EVERYDAY AT BEDTIME 90 tablet 1   Multiple Vitamins-Minerals (VISION HEALTH PO) Take by mouth.     Nutritional Supplements (GLUCOSE MANAGEMENT PO) Take by mouth.     Omega-3 Fatty Acids (OMEGA 3 500 PO) Take 1 capsule by mouth in the morning and at bedtime.     polyethylene glycol (MIRALAX / GLYCOLAX) packet Take 17 g by mouth 2 (two) times a week.     Polyvinyl Alcohol-Povidone (REFRESH OP) Place 1 drop into both eyes daily.      Specialty Vitamins Products (CARDIOVASCULAR SUPPORT PO) Take 2 capsules by mouth daily.     valsartan (DIOVAN) 320 MG tablet Take 1 tablet (320 mg total) by mouth daily. 30 tablet 0   No current facility-administered medications on file prior to visit.    Review of Systems  Constitutional:  Positive for fatigue.  HENT:  Positive for congestion, ear pain, postnasal drip, rhinorrhea and sneezing. Negative for sinus pressure, sinus pain, sore throat and voice change.   Respiratory:  Positive for cough, shortness of breath and wheezing. Negative for chest tightness and stridor.   Gastrointestinal:  Negative for nausea and vomiting.  Neurological:  Negative for headaches.       Objective:   Physical Exam Constitutional:      General: She is not in acute distress.    Appearance: Normal appearance. She is normal weight.  She is not ill-appearing or diaphoretic.  HENT:     Head: Normocephalic and atraumatic.     Right Ear: Tympanic  membrane and ear canal normal.     Left Ear: Ear canal normal.     Nose: Congestion and rhinorrhea present.     Mouth/Throat:     Mouth: Mucous membranes are moist.     Pharynx: Oropharynx is clear. No oropharyngeal exudate or posterior oropharyngeal erythema.  Eyes:     General:        Right eye: No discharge.        Left eye: No discharge.     Conjunctiva/sclera: Conjunctivae normal.     Pupils: Pupils are equal, round, and reactive to light.  Cardiovascular:     Rate and Rhythm: Normal rate and regular rhythm.     Heart sounds: Normal heart sounds.  Pulmonary:     Effort: Pulmonary effort is normal. No respiratory distress.     Breath sounds: No stridor. Wheezing and rhonchi present. No rales.     Comments: Scant wheeze on forced expiration  Good air exch  Occ rhonchi   No rales or crackles   Not sob with speech or ambulation  Kyphosis is notable  Chest:     Chest wall: No tenderness.  Abdominal:     General: There is no distension.     Tenderness: There is no abdominal tenderness.  Musculoskeletal:     Cervical back: Neck supple. No tenderness.     Right lower leg: No edema.     Left lower leg: No edema.     Comments: Baseline kyphosis   Lymphadenopathy:     Cervical: No cervical adenopathy.  Neurological:     Mental Status: She is alert.  Psychiatric:        Mood and Affect: Mood normal.           Assessment & Plan:    Problem List Items Addressed This Visit       Respiratory   Asthma, moderate persistent    Some wheezing with this episode of allergies vs uri reviewed prior pulmonary notes and studies in setting of asthma history(Dr Young) and did medication review today  Pulse ox 90 % now (not sob on exam)  cxr ordered  Prednisone 40 mg taper px   Plan to continue singulair and breztri /albuterol  ER precautions noted Planning f/u with pulmonary       Relevant Medications   predniSONE (DELTASONE) 10 MG tablet   Other Relevant Orders   DG  Chest 2 View (Completed)     Other   Cough - Primary    Pt thinks this was from allergies after working in dust and outdoors  Also sneezing/runny nose and no fever  Some chest congestion now  Scant wheeze/few rhonchi in setting of asthma history  Prednisone px cxr consistent with bronchitis or asthma  ER parameters discussed Update if not starting to improve in a week or if worsening   She is planning pulmonary f/u      Relevant Orders   DG Chest 2 View (Completed)

## 2021-11-05 ENCOUNTER — Ambulatory Visit: Payer: Medicare PPO | Admitting: Adult Health

## 2021-11-05 ENCOUNTER — Telehealth: Payer: Self-pay | Admitting: *Deleted

## 2021-11-05 NOTE — Telephone Encounter (Signed)
-----   Message from Judy Pimple, MD sent at 11/04/2021  1:00 PM EDT ----- Jordan Patterson-please check on her tomorrow, thanks  Cc to Dr Maple Hudson

## 2021-11-05 NOTE — Telephone Encounter (Signed)
Called to check on pt per PCP.  Pt said she is still very weak, she's had a few doses of mucinex and one dose of prednisone as of right now. Pt said she is starting to feel a little better but "it's slow going" pt said she is taking it easy and getting as much rest as possible. Pt advised if sxs don't continue to improve or she develops any new sxs or she starts getting SOB to let us know asap. Pt verbalized understanding and thanked PCP for checking on her.

## 2021-11-09 DIAGNOSIS — Z01810 Encounter for preprocedural cardiovascular examination: Secondary | ICD-10-CM | POA: Insufficient documentation

## 2021-11-09 NOTE — Progress Notes (Unsigned)
Cardiology Office Note:    Date:  11/10/2021   ID:  Jordan Patterson, DOB [birth year], MRN [id number]  PCP:  Abner Greenspan, MD  [place] Providers Cardiologist:  Sinclair Grooms, MD    Referring MD: Abner Greenspan, MD   Chief Complaint:  Surgical clearance    Patient Profile: Hypertension  Cough 2/2 ACEi Hyperlipidemia  Diabetes mellitus  GERD Esophageal stricture; hx of dilation.  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 female with the above problem list.  She was last seen by Dr. Tamala Julian in 09/2020. She returns for surgical clearance. She needs surgery for vaginal prolapse with Dr. Harold Hedge at [facility] on 12/18/21.  The patient is here today with her daughter.  She notes that the plan is for spinal anesthesia but it may have to be converted to general anesthesia.  She currently is recovering from bronchitis.  She has been on prednisone.  Otherwise, she remains fairly active.  She has not had chest pain, shortness of breath, syncope, orthopnea, leg edema.    Past Medical History:  Diagnosis Date   Allergic rhinitis    Arthritis    hands and knees - ?osteo   Asthma    Chronic headaches    Compression fracture of T12 vertebra ([place]) 07/18/2014   S/p kyphoplasty by Dr Sherwood Gambler (08/2014)    Depression    pt denies   Diabetes type 2, controlled ([place]) 2004   Environmental allergies    dust,mold,mildew   Extrinsic asthma    GERD (gastroesophageal reflux disease)    Olevia Perches) EGD - mild esophageal dysmotility and small hiatal hernia   Hiatal hernia    Hx of migraines    Hyperlipidemia    mild, diet controlled   Hypertension    Hypothyroidism    Macular degeneration    Osteoporosis with fracture 07/2014   T -1.2 hip, T 0.2 spine, T12 compression  fracture s/p kyphoplasty   Current Medications: Current Meds  Medication Sig   albuterol (VENTOLIN HFA) 108 (90 Base) MCG/ACT inhaler TAKE 2 PUFFS BY MOUTH EVERY 6 HOURS AS NEEDED FOR WHEEZE OR SHORTNESS OF BREATH   amLODipine (NORVASC) 5 MG tablet Take 1 tablet (5 mg total) by mouth daily.   aspirin 81 MG tablet Take 81 mg by mouth daily.   Budeson-Glycopyrrol-Formoterol (BREZTRI AEROSPHERE) 160-9-4.8 MCG/ACT AERO TAKE 2 PUFFS BY MOUTH TWICE A DAY   Calcium 250 MG CAPS Take 500 mg by mouth in the morning and at bedtime.   chlorpheniramine (CHLOR-TRIMETON) 4 MG tablet Take 4 mg by mouth in the morning, at noon, and at bedtime.   chlorthalidone (HYGROTON) 25 MG tablet Take 1 tablet (25 mg total) by mouth daily.   Cholecalciferol (VITAMIN D3) 1000 units CAPS Take 1 capsule (1,000 Units total) by mouth daily.   diphenhydramine-acetaminophen (TYLENOL PM) 25-500 MG TABS tablet Take 1 tablet by mouth at bedtime.   esomeprazole (NEXIUM) 40 MG capsule 1 capsule by mouth every day before breakfast   famotidine (PEPCID) 20 MG tablet TAKE 1 TABLET BY MOUTH EVERYDAY AT BEDTIME   ferrous sulfate 325 (65 FE) MG EC tablet Take 325 mg by mouth every Monday, Wednesday, and Friday.   Garlic (GARLIQUE PO) Take 1 tablet by mouth daily.  levothyroxine (SYNTHROID) 100 MCG tablet TAKE 1 TABLET BY MOUTH EVERY DAY BEFORE BREAKFAST   MAGNESIUM PO Take 2 tablets by mouth daily.   metFORMIN (GLUCOPHAGE) 500 MG tablet TAKE 1 TABLET BY MOUTH 2 TIMES DAILY WITH A MEAL.   montelukast (SINGULAIR) 10 MG tablet TAKE 1 TABLET BY MOUTH EVERYDAY AT BEDTIME   Multiple Vitamins-Minerals (VISION HEALTH PO) Take by mouth.   Nutritional Supplements (GLUCOSE MANAGEMENT PO) Take by mouth.   Omega-3 Fatty Acids (OMEGA 3 500 PO) Take 1 capsule by mouth in the morning and at bedtime.   polyethylene glycol (MIRALAX / GLYCOLAX) packet Take 17 g by mouth 2 (two) times a week.   Polyvinyl Alcohol-Povidone (REFRESH OP) Place 1 drop into both  eyes daily.    predniSONE (DELTASONE) 10 MG tablet Take 4 pills once daily by mouth for 3 days, then 3 pills daily for 3 days, then 2 pills daily for 3 days then 1 pill daily for 3 days then stop   Specialty Vitamins Products (CARDIOVASCULAR SUPPORT PO) Take 2 capsules by mouth daily.   valsartan (DIOVAN) 320 MG tablet Take 1 tablet (320 mg total) by mouth daily.    Allergies:   Ace inhibitors, Crestor [rosuvastatin], Wellbutrin [bupropion hcl], Cymbalta [duloxetine hcl], Lipitor [atorvastatin], Pregabalin, and Tegretol [carbamazepine]   Social History   Tobacco Use   Smoking status: Never   Smokeless tobacco: Never  Vaping Use   Vaping Use: Never used  Substance Use Topics   Alcohol use: No    Alcohol/week: 0.0 standard drinks of alcohol   Drug use: No    Family Hx: The patient's family history includes Diabetes in her father; Heart failure in her maternal grandfather; Hypertension in her mother; Mitral valve prolapse in her father and mother; Other in her brother; Parkinson's disease in her brother; Stroke in her paternal grandfather; Stroke (age of onset: 56) in her mother. There is no history of Cancer, Colon cancer, Stomach cancer, Esophageal cancer, Pancreatic cancer, or Liver disease.  Review of Systems  Respiratory:  Positive for cough.      EKGs/Labs/Other Test Reviewed:    EKG:  EKG is  ordered today.  The ekg ordered today demonstrates NSR, HR 97, left axis deviation, no ST-T wave changes, poor wave progression, QTc 467, no change from prior tracing  Recent Labs: 04/30/2021: ALT 23; Hemoglobin 13.4; Platelets 200.0 08/13/2021: BUN 20; Creatinine, Ser 0.84; Potassium 4.1; Sodium 131; TSH 5.35   Recent Lipid Panel Recent Labs    04/30/21 0758  CHOL 255*  TRIG 117.0  HDL 68.50  VLDL 23.4  LDLCALC 163*     Risk Assessment/Calculations/Metrics:              Physical Exam:    VS:  BP 134/72   Pulse 97   Ht 5\' 6"  (1.676 m)   Wt 166 lb 9.6 oz (75.6 kg)   SpO2  94%   BMI 26.89 kg/m     Wt Readings from Last 3 Encounters:  11/10/21 166 lb 9.6 oz (75.6 kg)  11/04/21 168 lb 8 oz (76.4 kg)  08/13/21 168 lb 6.4 oz (76.4 kg)    Constitutional:      Appearance: Healthy appearance. Not in distress.  Neck:     Vascular: JVD normal.  Pulmonary:     Effort: Pulmonary effort is normal.     Breath sounds: No wheezing. No rales.  Cardiovascular:     Normal rate. Regular rhythm. Normal S1. Normal S2.  Murmurs: There is no murmur.  Edema:    Peripheral edema absent.  Abdominal:     Palpations: Abdomen is soft.  Skin:    General: Skin is warm and dry.  Neurological:     Mental Status: Alert and oriented to person, place and time.         ASSESSMENT & PLAN:   Preoperative cardiovascular examination    Jordan Patterson's perioperative risk of a major cardiac event is 0.9% according to the Revised Cardiac Risk Index (RCRI).  Therefore, she is at low risk for perioperative complications.   Her functional capacity is good at 4.31 METs according to the Duke Activity Status Index (DASI). Recommendations: According to ACC/AHA guidelines, no further cardiovascular testing needed.  The patient may proceed to surgery at acceptable risk.   Antiplatelet and/or Anticoagulation Recommendations: Aspirin can be held for 7 days prior to her surgery.  Please resume Aspirin post operatively when it is felt to be safe from a bleeding standpoint.     Essential hypertension Blood pressures well controlled.  Continue amlodipine 5 mg daily, chlorthalidone 25 mg daily, valsartan 320 mg daily.            Dispo:  Return for Scheduled Follow Up with Dr. Tamala Julian.   Medication Adjustments/Labs and Tests Ordered: Current medicines are reviewed at length with the patient today.  Concerns regarding medicines are outlined above.  Tests Ordered: Orders Placed This Encounter  Procedures   EKG 12-Lead   Medication Changes: No orders of the defined types were placed in this  encounter.  Signed, Richardson Dopp, PA-C  11/10/2021 6:05 PM    [address], [place], [place]  [address] Phone: [phone]; Fax: [phone]

## 2021-11-10 ENCOUNTER — Ambulatory Visit: Payer: Medicare PPO | Attending: Physician Assistant | Admitting: Physician Assistant

## 2021-11-10 ENCOUNTER — Encounter: Payer: Self-pay | Admitting: Physician Assistant

## 2021-11-10 VITALS — BP 134/72 | HR 97 | Ht 66.0 in | Wt 166.6 lb

## 2021-11-10 DIAGNOSIS — Z01810 Encounter for preprocedural cardiovascular examination: Secondary | ICD-10-CM | POA: Diagnosis not present

## 2021-11-10 DIAGNOSIS — I10 Essential (primary) hypertension: Secondary | ICD-10-CM

## 2021-11-10 NOTE — Assessment & Plan Note (Signed)
  Ms. Cordial perioperative risk of a major cardiac event is 0.9% according to the Revised Cardiac Risk Index (RCRI).  Therefore, she is at low risk for perioperative complications.   Her functional capacity is good at 4.31 METs according to the Duke Activity Status Index (DASI). Recommendations: According to ACC/AHA guidelines, no further cardiovascular testing needed.  The patient may proceed to surgery at acceptable risk.   Antiplatelet and/or Anticoagulation Recommendations: Aspirin can be held for 7 days prior to her surgery.  Please resume Aspirin post operatively when it is felt to be safe from a bleeding standpoint.

## 2021-11-10 NOTE — Patient Instructions (Signed)
Medication Instructions:  Your physician recommends that you continue on your current medications as directed. Please refer to the Current Medication list given to you today.  *If you need a refill on your cardiac medications before your next appointment, please call your pharmacy*   Lab Work: None ordered' If you have labs (blood work) drawn today and your tests are completely normal, you will receive your results only by: [place] (if you have MyChart) OR A paper copy in the mail If you have any lab test that is abnormal or we need to change your treatment, we will call you to review the results.   Testing/Procedures: None ordered   Follow-Up: At [facility], you and your health needs are our priority.  As part of our continuing mission to provide you with exceptional heart care, we have created designated Provider Care Teams.  These Care Teams include your primary Cardiologist (physician) and Advanced Practice Providers (APPs -  Physician Assistants and Nurse Practitioners) who all work together to provide you with the care you need, when you need it.  We recommend signing up for the patient portal called "MyChart".  Sign up information is provided on this After Visit Summary.  MyChart is used to connect with patients for Virtual Visits (Telemedicine).  Patients are able to view lab/test results, encounter notes, upcoming appointments, etc.  Non-urgent messages can be sent to your provider as well.   To learn more about what you can do with MyChart, go to [email or web address].    Your next appointment:   As scheduled   The format for your next appointment:   In Person  Provider:   Sinclair Grooms, MD  or Richardson Dopp, PA-C         Other Instructions   Important Information About Sugar

## 2021-11-10 NOTE — Assessment & Plan Note (Signed)
Blood pressures well controlled.  Continue amlodipine 5 mg daily, chlorthalidone 25 mg daily, valsartan 320 mg daily.

## 2021-11-11 ENCOUNTER — Telehealth: Payer: Self-pay | Admitting: Family Medicine

## 2021-11-11 MED ORDER — DOXYCYCLINE HYCLATE 100 MG PO TABS: 100.0000 mg | ORAL_TABLET | Freq: Two times a day (BID) | ORAL | 0 refills | Status: DC

## 2021-11-11 NOTE — Addendum Note (Signed)
Addended by: Loura Pardon A on: 11/11/2021 04:32 PM   Modules accepted: Orders

## 2021-11-11 NOTE — Telephone Encounter (Signed)
Wheezing? Little wheezing Short of breath? No SOB Phlegm? Yes coughing up phlegm and blowing out her nose If so clear or colored ? Yellowish green sometimes it's dark brown Sinus pain /cold symptoms? No sinus pain just cough congestion and fatigue How bad is the cough? Pretty bad she can't really go outside she also has allergies right now and they are bad Any improvement with the prednisone for asthma: if she did have improvement it was very little, she still has a few days of prednisone left but not much difference in sxs  Her cxr was reassuring last time with no pneumonia thankfully: pt notified   Pt also has no fever

## 2021-11-11 NOTE — Telephone Encounter (Signed)
Patient called in stating that she isn't better from her 9/13 appointment with Dr Glori Bickers. She states that she doesn't have a fever. She would like to know if an antibiotic can be called in for her?

## 2021-11-11 NOTE — Telephone Encounter (Signed)
Pt notified Rx sent to pharmacy and ER precautions given. F/u appt scheduled

## 2021-11-11 NOTE — Telephone Encounter (Signed)
Thanks  I sent in doxycycline Schedule f/u next week please so I can re check her in office  Continue ER precautions

## 2021-11-11 NOTE — Telephone Encounter (Signed)
Wheezing? Short of breath?  Phlegm? If so clear or colored ?  Sinus pain /cold symptoms?  How bad is the cough?  Any improvement with the prednisone for asthma  Her cxr was reassuring last time with no pneumonia thankfully

## 2021-11-12 ENCOUNTER — Ambulatory Visit: Payer: Medicare PPO | Admitting: Adult Health

## 2021-11-12 ENCOUNTER — Encounter: Payer: Self-pay | Admitting: Adult Health

## 2021-11-12 VITALS — BP 136/78 | HR 89 | Temp 99.2°F | Ht 66.0 in | Wt 166.2 lb

## 2021-11-12 DIAGNOSIS — R058 Other specified cough: Secondary | ICD-10-CM

## 2021-11-12 DIAGNOSIS — J4540 Moderate persistent asthma, uncomplicated: Secondary | ICD-10-CM | POA: Diagnosis not present

## 2021-11-12 MED ORDER — BENZONATATE 200 MG PO CAPS: 200.0000 mg | ORAL_CAPSULE | Freq: Three times a day (TID) | ORAL | 1 refills | Status: DC | PRN

## 2021-11-12 NOTE — Patient Instructions (Signed)
Complete doxycycline and prednisone as directed Continue Mucinex twice daily as needed for cough and congestion Add Delsym 2 teaspoons twice daily Add Tessalon 3 times daily as needed for cough Continue on Breztri 2 puffs twice daily, rinse after use Albuterol inhaler as needed Follow-up in 3 to 4 weeks with Dr. Annamaria Boots and as needed Please contact office for sooner follow up if symptoms do not improve or worsen or seek emergency care

## 2021-11-12 NOTE — Assessment & Plan Note (Addendum)
Slow to resolve asthmatic bronchitic exacerbation.  Would complete course of antibiotics and prednisone as directed.  Add in Delsym and Tessalon to her current regimen with Mucinex.  Albuterol nebulizer treatment given in the office.  We will have her follow back up in 3 to 4 weeks to make sure she has improved and is stable prior to her upcoming surgery  Plan  Patient Instructions  Complete doxycycline and prednisone as directed Continue Mucinex twice daily as needed for cough and congestion Add Delsym 2 teaspoons twice daily Add Tessalon 3 times daily as needed for cough Continue on Breztri 2 puffs twice daily, rinse after use Albuterol inhaler as needed Follow-up in 3 to 4 weeks with Dr. Annamaria Boots and as needed Please contact office for sooner follow up if symptoms do not improve or worsen or seek emergency care

## 2021-11-12 NOTE — Progress Notes (Signed)
@Patient  ID: Jordan HorsemanJean C Edler, female    DOB: June 11, 1940, 81 y.o.   MRN: [id number]  Chief Complaint  Patient presents with   Follow-up    Referring provider: Judy Pimpleower, Marne A, MD  HPI: 81 year old female never smoker followed for asthma and allergic rhinitis  TEST/EVENTS :  FENO 12/29/15- 37-elevated, indicating allergic component to airway irritation. Office Spirometry 12/29/2015-moderately severe obstructive airways disease-FVC 1.99/60%, FEV1 1.29/51%, ratio 0.65, FEF 25-75 0.73/39%. Office Spirometry 08/17/2017- Moderate restriction.  FVC 2.1/66%, FEV1 1.6/65%, ratio 1.74, FEF 25-75% 1.3/65% CBC 08/18/2017-eos WNL 2.8, Allergy Profile 08/17/2017-elevated for dust mites, cat, pecan/hickory pollen, total IgE WNL 16  11/12/2021 Follow up : Asthma and AR  Patient presents for a follow-up visit.  She is having upcoming surgery for vaginal prolapse.  Unfortunately over the last 2 weeks she has had an acute asthmatic bronchitic exacerbation.  She developed initial cough congestion severe fatigue nasal drainage.  She had a positive exposure to family member with COVID-19.  She did not test.  She was seen by her primary care provider this week and started on antibiotics.  She is currently taking doxycycline and prednisone which she started today.  She says she has significant coughing despite taking Mucinex.  She denies any hemoptysis, chest pain, orthopnea, edema, calf pain or fever. Chest x-ray on 7/13 that showed no acute process with bronchitic changes.   Allergies  Allergen Reactions   Ace Inhibitors Cough   Crestor [Rosuvastatin] Other (See Comments)    myalgias   Wellbutrin [Bupropion Hcl] Other (See Comments)    Unknown reaction   Cymbalta [Duloxetine Hcl] Palpitations and Other (See Comments)    headaches   Lipitor [Atorvastatin] Cough   Pregabalin Other (See Comments)    Unknown reaction   Tegretol [Carbamazepine] Other (See Comments)    Dizziness, headache    Immunization  History  Administered Date(s) Administered   Fluad Quad(high Dose 65+) 12/08/2019, 12/15/2020   Influenza Whole 11/23/2011, 12/06/2012   Influenza, High Dose Seasonal PF 11/23/2018, 12/08/2019   Influenza,inj,Quad PF,6+ Mos 11/06/2014, 10/21/2015, 11/23/2016, 11/23/2017, 11/09/2018   Influenza-Unspecified 11/22/2013   PFIZER Comirnaty(Gray Top)Covid-19 Tri-Sucrose Vaccine 06/01/2019, 06/27/2019, 03/30/2020   PFIZER(Purple Top)SARS-COV-2 Vaccination 06/01/2019, 06/27/2019, 03/30/2020   Pneumococcal Conjugate-13 05/29/2015   Pneumococcal Polysaccharide-23 10/23/2005   Td 01/14/2012    Past Medical History:  Diagnosis Date   Allergic rhinitis    Arthritis    hands and knees - ?osteo   Asthma    Chronic headaches    Compression fracture of T12 vertebra ([facility]) 07/18/2014   S/p kyphoplasty by Dr Newell CoralNudelman (08/2014)    Depression    pt denies   Diabetes type 2, controlled ([facility]) 2004   Environmental allergies    dust,mold,mildew   Extrinsic asthma    GERD (gastroesophageal reflux disease)    [name]([place]) EGD - mild esophageal dysmotility and small hiatal hernia   Hiatal hernia    Hx of migraines    Hyperlipidemia    mild, diet controlled   Hypertension    Hypothyroidism    Macular degeneration    Osteoporosis with fracture 07/2014   T -1.2 hip, T 0.2 spine, T12 compression fracture s/p kyphoplasty    Tobacco History: Social History   Tobacco Use  Smoking Status Never   Passive exposure: Never  Smokeless Tobacco Never   Counseling given: Not Answered   Outpatient Medications Prior to Visit  Medication Sig Dispense Refill   albuterol (VENTOLIN HFA) 108 (90 Base) MCG/ACT inhaler TAKE 2 PUFFS BY  MOUTH EVERY 6 HOURS AS NEEDED FOR WHEEZE OR SHORTNESS OF BREATH 18 g 11   amLODipine (NORVASC) 5 MG tablet Take 1 tablet (5 mg total) by mouth daily. 30 tablet 3   aspirin 81 MG tablet Take 81 mg by mouth daily.     Budeson-Glycopyrrol-Formoterol (BREZTRI AEROSPHERE) 160-9-4.8 MCG/ACT  AERO TAKE 2 PUFFS BY MOUTH TWICE A DAY 32.1 g 3   Calcium 250 MG CAPS Take 500 mg by mouth in the morning and at bedtime.     chlorpheniramine (CHLOR-TRIMETON) 4 MG tablet Take 4 mg by mouth in the morning, at noon, and at bedtime.     chlorthalidone (HYGROTON) 25 MG tablet Take 1 tablet (25 mg total) by mouth daily. 90 tablet 1   Cholecalciferol (VITAMIN D3) 1000 units CAPS Take 1 capsule (1,000 Units total) by mouth daily. 30 capsule    diphenhydramine-acetaminophen (TYLENOL PM) 25-500 MG TABS tablet Take 1 tablet by mouth at bedtime.     doxycycline (VIBRA-TABS) 100 MG tablet Take 1 tablet (100 mg total) by mouth 2 (two) times daily. 14 tablet 0   esomeprazole (NEXIUM) 40 MG capsule 1 capsule by mouth every day before breakfast 90 capsule 6   famotidine (PEPCID) 20 MG tablet TAKE 1 TABLET BY MOUTH EVERYDAY AT BEDTIME 90 tablet 6   ferrous sulfate 325 (65 FE) MG EC tablet Take 325 mg by mouth every Monday, Wednesday, and Friday.     Garlic (GARLIQUE PO) Take 1 tablet by mouth daily.     levothyroxine (SYNTHROID) 100 MCG tablet TAKE 1 TABLET BY MOUTH EVERY DAY BEFORE BREAKFAST 90 tablet 2   MAGNESIUM PO Take 2 tablets by mouth daily.     metFORMIN (GLUCOPHAGE) 500 MG tablet TAKE 1 TABLET BY MOUTH 2 TIMES DAILY WITH A MEAL. 180 tablet 1   montelukast (SINGULAIR) 10 MG tablet TAKE 1 TABLET BY MOUTH EVERYDAY AT BEDTIME 90 tablet 1   Multiple Vitamins-Minerals (VISION HEALTH PO) Take by mouth.     Nutritional Supplements (GLUCOSE MANAGEMENT PO) Take by mouth.     Omega-3 Fatty Acids (OMEGA 3 500 PO) Take 1 capsule by mouth in the morning and at bedtime.     polyethylene glycol (MIRALAX / GLYCOLAX) packet Take 17 g by mouth 2 (two) times a week.     Polyvinyl Alcohol-Povidone (REFRESH OP) Place 1 drop into both eyes daily.      predniSONE (DELTASONE) 10 MG tablet Take 4 pills once daily by mouth for 3 days, then 3 pills daily for 3 days, then 2 pills daily for 3 days then 1 pill daily for 3 days then  stop 30 tablet 0   Specialty Vitamins Products (CARDIOVASCULAR SUPPORT PO) Take 2 capsules by mouth daily.     valsartan (DIOVAN) 320 MG tablet Take 1 tablet (320 mg total) by mouth daily. 30 tablet 0   No facility-administered medications prior to visit.     Review of Systems:   Constitutional:   No  weight loss, night sweats,  Fevers, chills,  +fatigue, or  lassitude.  HEENT:   No headaches,  Difficulty swallowing,  Tooth/dental problems, or  Sore throat,                No sneezing, itching, ear ache,  +nasal congestion, post nasal drip,   CV:  No chest pain,  Orthopnea, PND, swelling in lower extremities, anasarca, dizziness, palpitations, syncope.   GI  No heartburn, indigestion, abdominal pain, nausea, vomiting, diarrhea, change in bowel habits,  loss of appetite, bloody stools.   Resp:   No non-productive cough,  No coughing up of blood.  No change in color of mucus.  No wheezing.  No chest wall deformity  Skin: no rash or lesions.  GU: no dysuria, change in color of urine, no urgency or frequency.  No flank pain, no hematuria   MS:  No joint pain or swelling.  No decreased range of motion.  No back pain.    Physical Exam  BP 136/78 (BP Location: Left Arm, Patient Position: Sitting, Cuff Size: Normal)   Pulse 89   Temp 99.2 F (37.3 C) (Oral)   Ht 5\' 6"  (1.676 m)   Wt 166 lb 3.2 oz (75.4 kg)   SpO2 97%   BMI 26.83 kg/m   GEN: A/Ox3; pleasant , NAD, well nourished    HEENT:  [place]/AT,   NOSE-clear, THROAT-clear, no lesions, no postnasal drip or exudate noted.   NECK:  Supple w/ fair ROM; no JVD; normal carotid impulses w/o bruits; no thyromegaly or nodules palpated; no lymphadenopathy.    RESP few scattered rhonchi, speaks in full sentences.   no accessory muscle use, no dullness to percussion  CARD:  RRR, no m/r/g, no peripheral edema, pulses intact, no cyanosis or clubbing.  GI:   Soft & nt; nml bowel sounds; no organomegaly or masses detected.   Musco: Warm  bil, no deformities or joint swelling noted.   Neuro: alert, no focal deficits noted.    Skin: Warm, no lesions or rashes    Lab Results:   BNP No results found for: "BNP"  ProBNP No results found for: "PROBNP"  Imaging: DG Chest 2 View  Result Date: 11/04/2021 CLINICAL DATA:  Cough with chest congestion. EXAM: CHEST - 2 VIEW COMPARISON:  12/14/2018 FINDINGS: Heart size is normal. Tortuous aorta as seen previously. Central bronchial thickening but no infiltrate, collapse or effusion. Old spinal fractures with augmentation in 3 locations. IMPRESSION: Central bronchial thickening which could be due to bronchitis or asthma. No consolidation or collapse. Electronically Signed   By: Nelson Chimes M.D.   On: 11/04/2021 12:54          No data to display          Lab Results  Component Value Date   NITRICOXIDE 25 08/17/2017        Assessment & Plan:   Asthma, moderate persistent Slow to resolve asthmatic bronchitic exacerbation.  Would complete course of antibiotics and prednisone as directed.  Add in Delsym and Tessalon to her current regimen with Mucinex.  Albuterol nebulizer treatment given in the office.  We will have her follow back up in 3 to 4 weeks to make sure she has improved and is stable prior to her upcoming surgery  Plan  Patient Instructions  Complete doxycycline and prednisone as directed Continue Mucinex twice daily as needed for cough and congestion Add Delsym 2 teaspoons twice daily Add Tessalon 3 times daily as needed for cough Continue on Breztri 2 puffs twice daily, rinse after use Albuterol inhaler as needed Follow-up in 3 to 4 weeks with Dr. Annamaria Boots and as needed Please contact office for sooner follow up if symptoms do not improve or worsen or seek emergency care       Upper airway cough syndrome Cough control regimen.  Continue to control for triggers.  Add in Delsym and Tessalon  Plan  Patient Instructions  Complete doxycycline and  prednisone as directed Continue Mucinex twice daily as needed  for cough and congestion Add Delsym 2 teaspoons twice daily Add Tessalon 3 times daily as needed for cough Continue on Breztri 2 puffs twice daily, rinse after use Albuterol inhaler as needed Follow-up in 3 to 4 weeks with Dr. Maple Hudson and as needed Please contact office for sooner follow up if symptoms do not improve or worsen or seek emergency care         Rubye Oaks, NP 11/12/2021

## 2021-11-12 NOTE — Assessment & Plan Note (Signed)
Cough control regimen.  Continue to control for triggers.  Add in Delsym and [place]  Patient Instructions  Complete doxycycline and prednisone as directed Continue Mucinex twice daily as needed for cough and congestion Add Delsym 2 teaspoons twice daily Add Tessalon 3 times daily as needed for cough Continue on Breztri 2 puffs twice daily, rinse after use Albuterol inhaler as needed Follow-up in 3 to 4 weeks with Dr. Annamaria Boots and as needed Please contact office for sooner follow up if symptoms do not improve or worsen or seek emergency care

## 2021-11-16 ENCOUNTER — Telehealth: Payer: Self-pay | Admitting: Family Medicine

## 2021-11-16 NOTE — Telephone Encounter (Signed)
Pt notified and appt r/s to 11/17/21

## 2021-11-16 NOTE — Telephone Encounter (Signed)
Thanks for letting me know. Please schedule an in office visit so I can re evaluate you. I will also cc this to Tammy Parrett who saw you last in pulmonary.

## 2021-11-16 NOTE — Telephone Encounter (Signed)
Patient called in and stating that she was seen two weeks ago by Dr. Glori Bickers. She stated she isn't getting any better and having a hard time. She also stated that she is still feel a little weak and that she has gunk coming out of her head still but not the chest. She wanted to know what is the next steps. Please advise. Thank you!

## 2021-11-17 ENCOUNTER — Ambulatory Visit: Payer: Medicare PPO | Admitting: Family Medicine

## 2021-11-17 ENCOUNTER — Encounter: Payer: Self-pay | Admitting: Family Medicine

## 2021-11-17 ENCOUNTER — Ambulatory Visit (INDEPENDENT_AMBULATORY_CARE_PROVIDER_SITE_OTHER)
Admission: RE | Admit: 2021-11-17 | Discharge: 2021-11-17 | Disposition: A | Discharge status: Home / Self Care | Payer: Medicare PPO | Source: Ambulatory Visit | Attending: Family Medicine | Admitting: Family Medicine

## 2021-11-17 DIAGNOSIS — J209 Acute bronchitis, unspecified: Secondary | ICD-10-CM

## 2021-11-17 DIAGNOSIS — R059 Cough, unspecified: Secondary | ICD-10-CM | POA: Diagnosis not present

## 2021-11-17 NOTE — Patient Instructions (Addendum)
Mucinex DM or plain mucinex and delsym for cough and congestion  Continue the tessalon  If that is not working let us know    Drink fluids  Watch for wheezing -let us know if wheezing or tight chest  If short of breath-go to the ER  Chest xray now   We will get back to you with result

## 2021-11-17 NOTE — Assessment & Plan Note (Addendum)
Acute bronchitis with post viral cough  Seen here and then by pulmonary  Pulmonary note reviewed along with last imaging   No resp distress and improved pulm exam and pulse ox  cxr ordered today in light of length of illness  Finishing doxycycline and prednisone  Enc her to get started on DM with mucinex or in delsym Continue tessalon If not helpful may need to add a cough med with hydrodone later  Pend cxr report  Will continue alb nmt and inhalers and singulair   Addendum: cxr is clear  Will focus on cough control

## 2021-11-17 NOTE — Progress Notes (Signed)
Subjective:    Patient ID: Jordan Patterson, female    DOB: [birth year], 81 y.o.   MRN: [id number]  HPI Pt presents for f/u of uri/asthma symptoms  Wt Readings from Last 3 Encounters:  11/17/21 168 lb 6.4 oz (76.4 kg)  11/12/21 166 lb 3.2 oz (75.4 kg)  11/10/21 166 lb 9.6 oz (75.6 kg)   27.18 kg/m  Was seen by pulmonary on 9/21 for acure asthmatic bronchitis (here prev) that is slow to resolve Doxycycline Prednisone  Recommended delsym and tessalon and mucinex Has alb neb   Continues Breztri   Inst to plan f/u with Dr Maple Hudson in 3-4 wk  DG Chest 2 View  Result Date: 11/04/2021 CLINICAL DATA:  Cough with chest congestion. EXAM: CHEST - 2 VIEW COMPARISON:  12/14/2018 FINDINGS: Heart size is normal. Tortuous aorta as seen previously. Central bronchial thickening but no infiltrate, collapse or effusion. Old spinal fractures with augmentation in 3 locations. IMPRESSION: Central bronchial thickening which could be due to bronchitis or asthma. No consolidation or collapse. Electronically Signed   By: Paulina Fusi M.D.   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   Vaginal spotting 04/07/2021   Estrogen deficiency 04/07/2021   Thoracic compression fracture (HCC) 01/22/2021   Compression fracture of T11 vertebra (HCC) 12/15/2020   Current use of proton pump inhibitor 03/11/2020   Chronic midline low back pain without sciatica 03/12/2019   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   Hyponatremia 12/14/2018   Laryngopharyngeal reflux (LPR) 08/24/2017    Osteoporosis 08/24/2017   Leg cramping 08/21/2017   Regurgitation of food    Prolapse of female pelvic organs 02/19/2017   History of compression fracture of spine 07/18/2014   Hypertensive retinopathy of both eyes 06/04/2014   Nonexudative age-related macular degeneration 06/04/2014   Posterior vitreous detachment of both eyes 06/04/2014   Pseudophakia of both eyes 06/04/2014   Right knee pain 05/30/2014   Advanced care planning/counseling discussion 01/24/2014   Health maintenance examination 01/24/2014   Dysgeusia 11/12/2013   Upper airway cough syndrome 03/19/2013   Allergic conjunctivitis and rhinitis 05/15/2012   Asthma, moderate persistent 03/09/2012   Medicare annual wellness visit, subsequent 01/14/2012   Hiatal hernia    GERD (gastroesophageal reflux disease)    Essential hypertension    Hyperlipidemia associated with type 2 diabetes mellitus (HCC)    Depression    Hypothyroidism    Diabetes type 2, controlled (HCC)    Osteoarthritis    Past Medical History:  Diagnosis Date   Allergic rhinitis    Arthritis    hands and knees - ?osteo   Asthma    Chronic headaches    Compression fracture of T12 vertebra (HCC) 07/18/2014   S/p kyphoplasty by Dr Newell Coral (08/2014)    Depression    pt denies   Diabetes type 2, controlled (HCC) 2004   Environmental allergies    dust,mold,mildew   Extrinsic asthma    GERD (gastroesophageal reflux disease)    (  [facility]) EGD - mild esophageal dysmotility and small hiatal hernia   Hiatal hernia    Hx of migraines    Hyperlipidemia    mild, diet controlled   Hypertension    Hypothyroidism    Macular degeneration    Osteoporosis with fracture 07/2014   T -1.2 hip, T 0.2 spine, T12 compression fracture s/p kyphoplasty   Past Surgical History:  Procedure Laterality Date   7024 HOUR PH STUDY N/A 04/11/2017   Procedure: 24 HOUR PH STUDY;  Surgeon: Napoleon FormNandigam, Kavitha V, MD;  Location: [facility];  Service: Endoscopy;  Laterality: N/A;    BACK SURGERY     CATARACT EXTRACTION  02/22/2001   bilateral with lens implant   COLONOSCOPY     DEXA  05/23/2008   improved (initial DEXA 2007 - mild osteopenia)   DILATION AND CURETTAGE OF UTERUS  02/23/1976   ESOPHAGEAL MANOMETRY N/A 04/11/2017   Procedure: ESOPHAGEAL MANOMETRY (EM);  Surgeon: Napoleon FormNandigam, Kavitha V, MD;  Location: [facility];  Service: Endoscopy;  Laterality: N/A;   ESOPHAGOGASTRODUODENOSCOPY  06/25/2011   Procedure: ESOPHAGOGASTRODUODENOSCOPY (EGD);  Surgeon: Hart Carwinora M Brodie, MD;  [facility];  Service: Endoscopy;  Laterality: N/A;  no Xray   EYE SURGERY     KYPHOPLASTY N/A 08/30/2014   Procedure: THORACIC TWELVE KYPHOPLASTY;  Surgeon: Shirlean Kellyobert Nudelman, MD   KYPHOPLASTY N/A 01/22/2021   Procedure: Thoracic eight, Thoracic nine KYPHOPLASTY;  Surgeon: Coletta Memosabbell, Kyle, MD;  [facility];  Service: Neurosurgery;  Laterality: N/A;   LAPAROSCOPY ABDOMEN DIAGNOSTIC     To R/O endometriosis   PH IMPEDANCE STUDY N/A 04/11/2017   Procedure: PH IMPEDANCE STUDY;  Surgeon: Napoleon FormNandigam, Kavitha V, MD;  Location: [facility];  Service: Endoscopy;  Laterality: N/A;   SAVORY DILATION  06/25/2011   Procedure: SAVORY DILATION;  Surgeon: Hart Carwinora M Brodie, MD;  Location: [facility];  Service: Endoscopy;  Laterality: N/A;   TONSILLECTOMY AND ADENOIDECTOMY  02/23/1960   US ECHOCARDIOGRAPHY  10/24/2014   nl systolic function EF 55%, mild diastolic dysfunction   Social History   Tobacco Use   Smoking status: Never    Passive exposure: Never   Smokeless tobacco: Never  Vaping Use   Vaping Use: Never used  Substance Use Topics   Alcohol use: No    Alcohol/week: 0.0 standard drinks of alcohol   Drug use: No   Family History  Problem Relation Age of Onset   Mitral valve prolapse Father    Diabetes Father    Mitral valve prolapse Mother    Hypertension Mother    Stroke Mother [id number]       after valve surgery   Parkinson's disease Brother    Other Brother        POST WAR  TRAUMA   Stroke Paternal Grandfather    Heart failure Maternal Grandfather    Cancer Neg Hx    Colon cancer Neg Hx    Stomach cancer Neg Hx    Esophageal cancer Neg Hx    Pancreatic cancer Neg Hx    Liver disease Neg Hx    Allergies  Allergen Reactions   Ace Inhibitors Cough   Crestor [Rosuvastatin] Other (See Comments)    myalgias   Wellbutrin [Bupropion Hcl] Other (See Comments)    Unknown reaction   Cymbalta [Duloxetine Hcl] Palpitations and Other (See Comments)    headaches   Lipitor [Atorvastatin] Cough   Pregabalin Other (See Comments)    Unknown reaction   Tegretol [Carbamazepine] Other (See Comments)  Dizziness, headache   Current Outpatient Medications on File Prior to Visit  Medication Sig Dispense Refill   albuterol (VENTOLIN HFA) 108 (90 Base) MCG/ACT inhaler TAKE 2 PUFFS BY MOUTH EVERY 6 HOURS AS NEEDED FOR WHEEZE OR SHORTNESS OF BREATH 18 g 11   amLODipine (NORVASC) 5 MG tablet Take 1 tablet (5 mg total) by mouth daily. 30 tablet 3   aspirin 81 MG tablet Take 81 mg by mouth daily.     benzonatate (TESSALON) 200 MG capsule Take 1 capsule (200 mg total) by mouth 3 (three) times daily as needed. 45 capsule 1   Budeson-Glycopyrrol-Formoterol (BREZTRI AEROSPHERE) 160-9-4.8 MCG/ACT AERO TAKE 2 PUFFS BY MOUTH TWICE A DAY 32.1 g 3   Calcium 250 MG CAPS Take 500 mg by mouth in the morning and at bedtime.     chlorpheniramine (CHLOR-TRIMETON) 4 MG tablet Take 4 mg by mouth in the morning, at noon, and at bedtime.     chlorthalidone (HYGROTON) 25 MG tablet Take 1 tablet (25 mg total) by mouth daily. 90 tablet 1   Cholecalciferol (VITAMIN D3) 1000 units CAPS Take 1 capsule (1,000 Units total) by mouth daily. 30 capsule    diphenhydramine-acetaminophen (TYLENOL PM) 25-500 MG TABS tablet Take 1 tablet by mouth at bedtime.     doxycycline (VIBRA-TABS) 100 MG tablet Take 1 tablet (100 mg total) by mouth 2 (two) times daily. 14 tablet 0   esomeprazole (NEXIUM) 40 MG capsule 1  capsule by mouth every day before breakfast 90 capsule 6   famotidine (PEPCID) 20 MG tablet TAKE 1 TABLET BY MOUTH EVERYDAY AT BEDTIME 90 tablet 6   ferrous sulfate 325 (65 FE) MG EC tablet Take 325 mg by mouth every Monday, Wednesday, and Friday.     Garlic (GARLIQUE PO) Take 1 tablet by mouth daily.     levothyroxine (SYNTHROID) 100 MCG tablet TAKE 1 TABLET BY MOUTH EVERY DAY BEFORE BREAKFAST 90 tablet 2   MAGNESIUM PO Take 2 tablets by mouth daily.     metFORMIN (GLUCOPHAGE) 500 MG tablet TAKE 1 TABLET BY MOUTH 2 TIMES DAILY WITH A MEAL. 180 tablet 1   montelukast (SINGULAIR) 10 MG tablet TAKE 1 TABLET BY MOUTH EVERYDAY AT BEDTIME 90 tablet 1   Multiple Vitamins-Minerals (VISION HEALTH PO) Take by mouth.     Nutritional Supplements (GLUCOSE MANAGEMENT PO) Take by mouth.     Omega-3 Fatty Acids (OMEGA 3 500 PO) Take 1 capsule by mouth in the morning and at bedtime.     polyethylene glycol (MIRALAX / GLYCOLAX) packet Take 17 g by mouth 2 (two) times a week.     Polyvinyl Alcohol-Povidone (REFRESH OP) Place 1 drop into both eyes daily.      Specialty Vitamins Products (CARDIOVASCULAR SUPPORT PO) Take 2 capsules by mouth daily.     valsartan (DIOVAN) 320 MG tablet Take 1 tablet (320 mg total) by mouth daily. 30 tablet 0   No current facility-administered medications on file prior to visit.     Review of Systems  Constitutional:  Positive for fatigue. Negative for activity change, appetite change, fever and unexpected weight change.  HENT:  Negative for congestion, ear pain, rhinorrhea, sinus pressure and sore throat.   Eyes:  Negative for pain, redness and visual disturbance.  Respiratory:  Positive for cough. Negative for chest tightness, shortness of breath, wheezing and stridor.   Cardiovascular:  Negative for chest pain and palpitations.  Gastrointestinal:  Negative for abdominal pain, blood in stool, constipation and diarrhea.  Endocrine: Negative for polydipsia and polyuria.   Genitourinary:  Negative for dysuria, frequency and urgency.  Musculoskeletal:  Negative for arthralgias, back pain and myalgias.  Skin:  Negative for pallor and rash.  Allergic/Immunologic: Negative for environmental allergies.  Neurological:  Negative for dizziness, syncope and headaches.  Hematological:  Negative for adenopathy. Does not bruise/bleed easily.  Psychiatric/Behavioral:  Negative for decreased concentration and dysphoric mood. The patient is not nervous/anxious.        Objective:   Physical Exam Constitutional:      General: She is not in acute distress.    Appearance: Normal appearance. She is well-developed and normal weight. She is not ill-appearing, toxic-appearing or diaphoretic.  HENT:     Head: Normocephalic and atraumatic.     Comments: Nares are injected and congested    No facial swelling     Right Ear: Tympanic membrane, ear canal and external ear normal.     Left Ear: Tympanic membrane, ear canal and external ear normal.     Ears:     Comments: Scant cerumen bilaterally     Nose: Congestion and rhinorrhea present.     Mouth/Throat:     Mouth: Mucous membranes are moist.     Pharynx: Oropharynx is clear. No oropharyngeal exudate or posterior oropharyngeal erythema.     Comments: Clear pnd  Eyes:     General:        Right eye: No discharge.        Left eye: No discharge.     Conjunctiva/sclera: Conjunctivae normal.     Pupils: Pupils are equal, round, and reactive to light.  Cardiovascular:     Rate and Rhythm: Normal rate and regular rhythm.     Heart sounds: Normal heart sounds.  Pulmonary:     Effort: Pulmonary effort is normal. No respiratory distress.     Breath sounds: Normal breath sounds. No stridor. No wheezing, rhonchi or rales.     Comments: Good air exch  No wheeze except with forced expiration  (mild) No rales or rhonchi  Not sob with speech   Chest:     Chest wall: No tenderness.  Musculoskeletal:     Cervical back: Normal  range of motion and neck supple. No tenderness.  Lymphadenopathy:     Cervical: No cervical adenopathy.  Skin:    General: Skin is warm and dry.     Capillary Refill: Capillary refill takes less than 2 seconds.     Findings: No rash.  Neurological:     Mental Status: She is alert.     Cranial Nerves: No cranial nerve deficit.  Psychiatric:        Mood and Affect: Mood normal.           Assessment & Plan:   Problem List Items Addressed This Visit       Respiratory   Acute bronchitis    Acute bronchitis with post viral cough  Seen here and then by pulmonary  Pulmonary note reviewed along with last imaging   No resp distress and improved pulm exam and pulse ox  cxr ordered today in light of length of illness  Finishing doxycycline and prednisone  Enc her to get started on DM with mucinex or in delsym Continue tessalon If not helpful may need to add a cough med with hydrodone later  Pend cxr report  Will continue alb nmt and inhalers and singulair   Addendum: cxr is clear  Will focus on cough control  Relevant Orders   DG Chest 2 View (Completed)

## 2021-11-18 ENCOUNTER — Ambulatory Visit: Payer: Medicare PPO | Admitting: Family Medicine

## 2021-11-25 ENCOUNTER — Other Ambulatory Visit: Payer: Self-pay | Admitting: Interventional Cardiology

## 2021-11-29 NOTE — Progress Notes (Unsigned)
HPI F never smoker followed for allergic rhinitis, asthma, complicated by GERD, hypothyroid, DM 2 FENO 12/29/15- 37-elevated, indicating allergic component to airway irritation. Office Spirometry 12/29/2015-moderately severe obstructive airways disease-FVC 1.99/60%, FEV1 1.29/51%, ratio 0.65, FEF 25-75 0.73/39%. Office Spirometry 08/17/2017- Moderate restriction.  FVC 2.1/66%, FEV1 1.6/65%, ratio 1.74, FEF 25-75% 1.3/65% CBC 08/18/2017-eos WNL 2.8, Allergy Profile 08/17/2017-elevated for dust mites, cat, pecan/hickory pollen, total IgE WNL 16 --------------------------------------------------------------------------------------------   12/22/20-  81 year old female never smoker followed for Allergic rhinitis, Asthma/ Chronic Cough, complicated by GERD/ HH (GI recs no Nissen at this time), hypothyroid, DM 2, HTN -Ventolin hfa, Breztri, Singulair, Pepcid/ Nexium,                        Trelegy didn't help cough Covid vax- 3 Phizer Flu vax-had Usimg albuterol HFA 1-2x/ week- does help with cough. Asks about trying Primatiene( inhaled Epi)- I advised against- stimulant. Cough still described as occasional, dry. Not clearly assoc w reflux, swallowing, environment. No acute issues or significant respiratory limitation. Cough does aggravate back pain for which she will he having surgery.  12/01/21-  81 year old female never smoker followed for Allergic rhinitis, Asthma/ Chronic Cough, complicated by GERD/ HH (GI recs no Nissen at this time), hypothyroid, DM 2, HTN -Ventolin hfa, Breztri, Singulair, Pepcid/ Nexium,                         Trelegy didn't help cough Covid vax- 6 Phizer Flu vax- LOV Parrett, NP- 11/12/21- Recent exacerb Asthma, AR. Pending pelvic floor repair.> pred, doxy ------Still SOB on exertion, pt is doing some better Family member had Covid. Pt wasn't tested, or treated for Covid, but suspects that is what she had. PCP told her it wasn't necessary to test since she was past the  treatment window. Baseline dry cough x many years, probably multifactorial with upper airway cough syndrome, asthma, postnasal drip and maybe some reflux. Has improved gradually since treatment by NP on 9/21, but not quite back to her normal. Discussed plans for pelvic floor repair by GYN in 2-3 weeks. No fever or purulent. We are going to try to calm airways more by extending prednisone taper. CXR 11/17/21-  IMPRESSION: No active cardiopulmonary disease.  ROS-see HPI    += positive Constitutional:   No-   weight loss, night sweats, fevers, chills, fatigue, lassitude. HEENT:   No-  headaches, difficulty swallowing, tooth/dental problems, sore throat,       +sneezing, itching, ear ache, nasal congestion, post nasal drip,  CV:  No-   chest pain, orthopnea, PND, swelling in lower extremities, anasarca,                                                       dizziness, palpitations Resp: No-   shortness of breath with exertion or at rest.              No-   productive cough,  + non-productive cough,  No- coughing up of blood.              No-   change in color of mucus.+ wheezing.   Skin: No-   rash or lesions. GI:  +  heartburn, indigestion, No-abdominal pain, nausea, vomiting, GU:  MS:  No-   joint pain  or swelling.  + back pain Neuro-     nothing unusual Psych:  No- change in mood or affect. No depression or anxiety.  No memory loss.  OBJ- Physical Exam General- Alert, Oriented, Affect-appropriate, Distress- none acute     Daughter here Skin- rash-none, lesions- none, excoriation- none Lymphadenopathy- none Head- atraumatic            Eyes- Gross vision intact, PERRLA, conjunctivae and secretions clear            Ears- clear            Nose- Clear, no-Septal dev, mucus, polyps, erosion, perforation             Throat- Mallampati II , mucosa clear , drainage+white, tonsils- atrophic Neck- flexible , trachea midline, no stridor , thyroid nl, carotid no bruit Chest - symmetrical excursion ,  unlabored           Heart/CV- RRR , no murmur , no gallop  , no rub, nl s1 s2                           - JVD- none , edema- none, stasis changes- none, varices- none           Lung- clear to P&A/ no crackles, wheeze- none, cough+ dry with deep breath , dullness-none, rub- none           Chest wall- + back brace Abd-  Br/ Gen/ Rectal- Not done, not indicated Extrem- cyanosis- none, clubbing, none, atrophy- none, strength- nl Neuro- grossly intact to observation

## 2021-11-30 DIAGNOSIS — M9902 Segmental and somatic dysfunction of thoracic region: Secondary | ICD-10-CM | POA: Diagnosis not present

## 2021-11-30 DIAGNOSIS — S233XXA Sprain of ligaments of thoracic spine, initial encounter: Secondary | ICD-10-CM | POA: Diagnosis not present

## 2021-12-01 ENCOUNTER — Ambulatory Visit: Payer: Medicare PPO | Admitting: Internal Medicine

## 2021-12-01 ENCOUNTER — Encounter: Payer: Self-pay | Admitting: Internal Medicine

## 2021-12-01 DIAGNOSIS — J4540 Moderate persistent asthma, uncomplicated: Secondary | ICD-10-CM

## 2021-12-01 DIAGNOSIS — N819 Female genital prolapse, unspecified: Secondary | ICD-10-CM

## 2021-12-01 MED ORDER — PREDNISONE 10 MG PO TABS: ORAL_TABLET | ORAL | 0 refills | Status: DC

## 2021-12-01 NOTE — Assessment & Plan Note (Signed)
Pending pelvic floor repair.

## 2021-12-01 NOTE — Patient Instructions (Signed)
Script sent for prednisone to take 2 tabs daily x 4 days, then 1 daily x 4 days  You can discuss timing of your surgery withy your GYN and Anesthesiologist

## 2021-12-01 NOTE — Assessment & Plan Note (Signed)
Asthmatic bronchitis exacerbation, possibly Covid. Slowly improving back to baseline. Plan- prednisone treatment extension. Anticipate she will be able to go forward with planned GYN surgery. Will need attention to cough control and reflux precautions.

## 2021-12-10 ENCOUNTER — Telehealth: Payer: Self-pay | Admitting: Family Medicine

## 2021-12-10 NOTE — Telephone Encounter (Signed)
I spoke with pt; pt said had prolapse for awhile and about last 2 months affecting BMs. Pt is talking about simple bladder surgery but not scheduled yet. Pt has been using a gentle OTC laxative; [employer] brand dulcolax and sometimes it helps constipation and sometimes not so much. Pt last BM was 12/09/21. Pt has no abdominal pain, no fever and has seen no blood in BM. Pt scheduled appt with Dr Glori Bickers 12/14/21 at 3:30 sending note to DR [facility] and [place] CMA.

## 2021-12-10 NOTE — Telephone Encounter (Signed)
I like miralax and if she is already taking it may need to take more thank she is   Go up to three times daily mixed with fluids as directed  After bms become more regular then can drop down to the amount that works   Keep up fluids and eat fiber if able  Keep me posted

## 2021-12-10 NOTE — Telephone Encounter (Signed)
Please triage. Thanks.

## 2021-12-10 NOTE — Telephone Encounter (Signed)
Pt notified of Dr. Tower's comments and instructions and verbalized understanding  

## 2021-12-10 NOTE — Telephone Encounter (Signed)
Patient called and stated that she wanted to know if should could start using laxatives. Call back number [facility]) [phone].

## 2021-12-14 ENCOUNTER — Ambulatory Visit: Payer: Medicare PPO | Admitting: Family Medicine

## 2021-12-14 ENCOUNTER — Encounter: Payer: Self-pay | Admitting: Family Medicine

## 2021-12-14 VITALS — BP 136/78 | HR 105 | Temp 97.7°F | Ht 66.0 in | Wt 165.4 lb

## 2021-12-14 DIAGNOSIS — Z23 Encounter for immunization: Secondary | ICD-10-CM

## 2021-12-14 DIAGNOSIS — N819 Female genital prolapse, unspecified: Secondary | ICD-10-CM | POA: Diagnosis not present

## 2021-12-14 DIAGNOSIS — E871 Hypo-osmolality and hyponatremia: Secondary | ICD-10-CM

## 2021-12-14 DIAGNOSIS — M9902 Segmental and somatic dysfunction of thoracic region: Secondary | ICD-10-CM | POA: Diagnosis not present

## 2021-12-14 DIAGNOSIS — S233XXA Sprain of ligaments of thoracic spine, initial encounter: Secondary | ICD-10-CM | POA: Diagnosis not present

## 2021-12-14 DIAGNOSIS — K5900 Constipation, unspecified: Secondary | ICD-10-CM | POA: Diagnosis not present

## 2021-12-14 NOTE — Assessment & Plan Note (Signed)
Worsened by pelvic prolapse (pending poss surgical tx)   Disc options  Will try and get more fruits/veg/fiber  Trial of miralax mixed with 3 beverages per day-disc that this may take several days to work Then titrate to need  Watch for abd pain /n/v or other symptoms  Update if not starting to improve in a week or if worsening

## 2021-12-14 NOTE — Progress Notes (Signed)
Subjective:    Patient ID: Jordan Patterson, female    DOB: 1940-04-03, 81 y.o.   MRN: [id number]  HPI Pt presents for c/o constipation  Flu shot  To discuss female prolapse   Wt Readings from Last 3 Encounters:  12/14/21 165 lb 6 oz (75 kg)  12/01/21 169 lb 3.2 oz (76.7 kg)  11/17/21 168 lb 6.4 oz (76.4 kg)   26.69 kg/m  Getting better finally from bronchitis  Finished bronchitis Just a little congestion  Done with her prednisone     Prolapse affects her ability to have a bm  We recommended increasing miralax  Was difficult   Sometimes stools are hard No blood  No diarrhea   No abd pain  No nausea and vomiting    Had a bm yesterday     Unsure if she will have the surgery for prolapse  Put if off due to bronchitis  Sees practice in [place] Would like to get set up with uro-gyn here  Would have to be in the hospital and this would be more convenient  Would rather not have surgery in December    Planning  Anterior colporrhaphy Partlial copocleisis Oblierative perineorhaphy Cystoscopy  Not planning mesh    Neg cologuard 03/2015    Takes pepcid for GERD Also nexium   Magnesium - takes 2 pills daily   BP Readings from Last 3 Encounters:  12/14/21 136/78  12/01/21 128/76  11/17/21 (!) 162/84   Pulse Readings from Last 3 Encounters:  12/14/21 (!) 105  12/01/21 97  11/17/21 92     Metformin 500 mg bid Lab Results  Component Value Date   HGBA1C 7.1 (H) 08/13/2021    Chlorthalidone three days weekly    Hypothyroidism  Pt has no clinical changes No change in energy level/ hair or skin/ edema and no tremor Lab Results  Component Value Date   TSH 5.35 08/13/2021        Lab Results  Component Value Date   WBC 6.4 04/30/2021   HGB 13.4 04/30/2021   HCT 39.2 04/30/2021   MCV 89.9 04/30/2021   PLT 200.0 04/30/2021   Lab Results  Component Value Date   CREATININE 0.84 08/13/2021   BUN 20 08/13/2021   NA 131 (L) 08/13/2021   K 4.1  08/13/2021   CL 93 (L) 08/13/2021   CO2 29 08/13/2021  Baseline hyponatremia   Lab Results  Component Value Date   ALT 23 04/30/2021   AST 28 04/30/2021   ALKPHOS 68 04/30/2021   BILITOT 0.6 04/30/2021     Patient Active Problem List   Diagnosis Date Noted   Preoperative cardiovascular examination 11/09/2021   Cough 11/04/2021   Digital mucinous cyst of finger 08/13/2021   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   Vaginal spotting 04/07/2021   Estrogen deficiency 04/07/2021   Thoracic compression fracture (HCC) 01/22/2021   Compression fracture of T11 vertebra (HCC) 12/15/2020   Current use of proton pump inhibitor 03/11/2020   Chronic midline low back pain without sciatica 03/12/2019   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   Hyponatremia 12/14/2018   Laryngopharyngeal reflux (LPR) 08/24/2017   Osteoporosis 08/24/2017   Leg cramping 08/21/2017   Regurgitation of food    Prolapse of female pelvic organs 02/19/2017   History of compression fracture of spine 07/18/2014   Hypertensive retinopathy of both eyes 06/04/2014   Nonexudative age-related macular degeneration 06/04/2014   Posterior vitreous detachment of both eyes 06/04/2014   Pseudophakia of both eyes 06/04/2014  Right knee pain 05/30/2014   Advanced care planning/counseling discussion 01/24/2014   Health maintenance examination 01/24/2014   Dysgeusia 11/12/2013   Upper airway cough syndrome 03/19/2013   Allergic conjunctivitis and rhinitis 05/15/2012   Asthma, moderate persistent 03/09/2012   Medicare annual wellness visit, subsequent 01/14/2012   Hiatal hernia    GERD (gastroesophageal reflux disease)    Essential hypertension    Hyperlipidemia associated with type 2 diabetes mellitus (HCC)    Depression    Hypothyroidism    Diabetes type 2, controlled (HCC)    Osteoarthritis    Past Medical History:  Diagnosis Date   Allergic rhinitis    Arthritis    hands and knees - ?osteo   Asthma    Chronic headaches     Compression fracture of T12 vertebra (HCC) 07/18/2014   S/p kyphoplasty by Dr Newell Coral (08/2014)    Depression    pt denies   Diabetes type 2, controlled (HCC) 2004   Environmental allergies    dust,mold,mildew   Extrinsic asthma    GERD (gastroesophageal reflux disease)    [name] EGD - mild esophageal dysmotility and small hiatal hernia   Hiatal hernia    Hx of migraines    Hyperlipidemia    mild, diet controlled   Hypertension    Hypothyroidism    Macular degeneration    Osteoporosis with fracture 07/2014   T -1.2 hip, T 0.2 spine, T12 compression fracture s/p kyphoplasty   Past Surgical History:  Procedure Laterality Date   5 HOUR PH STUDY N/A 04/11/2017   Procedure: 24 HOUR PH STUDY;  Surgeon: Napoleon Form, MD;  Location: [facility];  Service: Endoscopy;  Laterality: N/A;   BACK SURGERY     CATARACT EXTRACTION  02/22/2001   bilateral with lens implant   COLONOSCOPY     DEXA  05/23/2008   improved (initial DEXA 2007 - mild osteopenia)   DILATION AND CURETTAGE OF UTERUS  02/23/1976   ESOPHAGEAL MANOMETRY N/A 04/11/2017   Procedure: ESOPHAGEAL MANOMETRY (EM);  Surgeon: Napoleon Form, MD;  Location: [facility];  Service: Endoscopy;  Laterality: N/A;   ESOPHAGOGASTRODUODENOSCOPY  06/25/2011   Procedure: ESOPHAGOGASTRODUODENOSCOPY (EGD);  Surgeon: Hart Carwin, MD;  Location: [facility];  Service: Endoscopy;  Laterality: N/A;  no Xray   EYE SURGERY     KYPHOPLASTY N/A 08/30/2014   Procedure: THORACIC TWELVE KYPHOPLASTY;  Surgeon: [name], MD   KYPHOPLASTY N/A 01/22/2021   Procedure: Thoracic eight, Thoracic nine KYPHOPLASTY;  Surgeon: Coletta Memos, MD;  Location: [facility];  Service: Neurosurgery;  Laterality: N/A;   LAPAROSCOPY ABDOMEN DIAGNOSTIC     To R/O endometriosis   PH IMPEDANCE STUDY N/A 04/11/2017   Procedure: PH IMPEDANCE STUDY;  Surgeon: Napoleon Form, MD;  Location: [facility];  Service: Endoscopy;  Laterality: N/A;   SAVORY  DILATION  06/25/2011   Procedure: SAVORY DILATION;  Surgeon: Hart Carwin, MD;  Location: [facility];  Service: Endoscopy;  Laterality: N/A;   TONSILLECTOMY AND ADENOIDECTOMY  02/23/1960   US ECHOCARDIOGRAPHY  10/24/2014   nl systolic function EF 55%, mild diastolic dysfunction   Social History   Tobacco Use   Smoking status: Never    Passive exposure: Never   Smokeless tobacco: Never  Vaping Use   Vaping Use: Never used  Substance Use Topics   Alcohol use: No    Alcohol/week: 0.0 standard drinks of alcohol   Drug use: No   Family History  Problem Relation Age of Onset  Mitral valve prolapse Father    Diabetes Father    Mitral valve prolapse Mother    Hypertension Mother    Stroke Mother 25       after valve surgery   Parkinson's disease Brother    Other Brother        POST WAR TRAUMA   Stroke Paternal Grandfather    Heart failure Maternal Grandfather    Cancer Neg Hx    Colon cancer Neg Hx    Stomach cancer Neg Hx    Esophageal cancer Neg Hx    Pancreatic cancer Neg Hx    Liver disease Neg Hx    Allergies  Allergen Reactions   Ace Inhibitors Cough   Crestor [Rosuvastatin] Other (See Comments)    myalgias   Wellbutrin [Bupropion Hcl] Other (See Comments)    Unknown reaction   Cymbalta [Duloxetine Hcl] Palpitations and Other (See Comments)    headaches   Lipitor [Atorvastatin] Cough   Pregabalin Other (See Comments)    Unknown reaction   Tegretol [Carbamazepine] Other (See Comments)    Dizziness, headache   Current Outpatient Medications on File Prior to Visit  Medication Sig Dispense Refill   albuterol (VENTOLIN HFA) 108 (90 Base) MCG/ACT inhaler TAKE 2 PUFFS BY MOUTH EVERY 6 HOURS AS NEEDED FOR WHEEZE OR SHORTNESS OF BREATH 18 g 11   amLODipine (NORVASC) 5 MG tablet Take 1 tablet (5 mg total) by mouth daily. 30 tablet 3   aspirin 81 MG tablet Take 81 mg by mouth daily.     Budeson-Glycopyrrol-Formoterol (BREZTRI AEROSPHERE) 160-9-4.8 MCG/ACT AERO TAKE 2  PUFFS BY MOUTH TWICE A DAY 32.1 g 3   Calcium 250 MG CAPS Take 500 mg by mouth in the morning and at bedtime.     chlorpheniramine (CHLOR-TRIMETON) 4 MG tablet Take 4 mg by mouth in the morning, at noon, and at bedtime.     chlorthalidone (HYGROTON) 25 MG tablet Take 1 tablet (25 mg total) by mouth daily. 90 tablet 1   Cholecalciferol (VITAMIN D3) 1000 units CAPS Take 1 capsule (1,000 Units total) by mouth daily. 30 capsule    dextromethorphan-guaiFENesin (MUCINEX DM) 30-600 MG 12hr tablet Take 1 tablet by mouth daily.     diphenhydramine-acetaminophen (TYLENOL PM) 25-500 MG TABS tablet Take 1 tablet by mouth at bedtime.     esomeprazole (NEXIUM) 40 MG capsule 1 capsule by mouth every day before breakfast 90 capsule 6   famotidine (PEPCID) 20 MG tablet TAKE 1 TABLET BY MOUTH EVERYDAY AT BEDTIME 90 tablet 6   ferrous sulfate 325 (65 FE) MG EC tablet Take 325 mg by mouth every Monday, Wednesday, and Friday.     Garlic (GARLIQUE PO) Take 1 tablet by mouth daily.     levothyroxine (SYNTHROID) 100 MCG tablet TAKE 1 TABLET BY MOUTH EVERY DAY BEFORE BREAKFAST 90 tablet 2   MAGNESIUM PO Take 2 tablets by mouth daily.     metFORMIN (GLUCOPHAGE) 500 MG tablet TAKE 1 TABLET BY MOUTH 2 TIMES DAILY WITH A MEAL. 180 tablet 1   montelukast (SINGULAIR) 10 MG tablet TAKE 1 TABLET BY MOUTH EVERYDAY AT BEDTIME 90 tablet 1   Multiple Vitamins-Minerals (VISION HEALTH PO) Take by mouth.     Nutritional Supplements (GLUCOSE MANAGEMENT PO) Take by mouth.     Omega-3 Fatty Acids (OMEGA 3 500 PO) Take 1 capsule by mouth in the morning and at bedtime.     polyethylene glycol (MIRALAX / GLYCOLAX) packet Take 17 g by mouth 2 (  two) times a week.     Polyvinyl Alcohol-Povidone (REFRESH OP) Place 1 drop into both eyes daily.      Specialty Vitamins Products (CARDIOVASCULAR SUPPORT PO) Take 2 capsules by mouth daily.     valsartan (DIOVAN) 320 MG tablet TAKE 1 TABLET BY MOUTH EVERY DAY 90 tablet 3   No current  facility-administered medications on file prior to visit.    Review of Systems  Constitutional:  Negative for activity change, appetite change, fatigue, fever and unexpected weight change.  HENT:  Negative for congestion, ear pain, rhinorrhea, sinus pressure and sore throat.   Eyes:  Negative for pain, redness and visual disturbance.  Respiratory:  Negative for cough, shortness of breath and wheezing.   Cardiovascular:  Negative for chest pain and palpitations.  Gastrointestinal:  Positive for constipation. Negative for abdominal pain, blood in stool and diarrhea.  Endocrine: Negative for polydipsia and polyuria.  Genitourinary:  Positive for frequency and urgency. Negative for dysuria.  Musculoskeletal:  Negative for arthralgias, back pain and myalgias.  Skin:  Negative for pallor and rash.  Allergic/Immunologic: Negative for environmental allergies.  Neurological:  Negative for dizziness, syncope and headaches.  Hematological:  Negative for adenopathy. Does not bruise/bleed easily.  Psychiatric/Behavioral:  Negative for decreased concentration and dysphoric mood. The patient is not nervous/anxious.        Objective:   Physical Exam Constitutional:      General: She is not in acute distress.    Appearance: Normal appearance. She is well-developed and normal weight. She is not ill-appearing or diaphoretic.  HENT:     Head: Normocephalic and atraumatic.  Eyes:     General: No scleral icterus.    Conjunctiva/sclera: Conjunctivae normal.     Pupils: Pupils are equal, round, and reactive to light.  Neck:     Thyroid: No thyromegaly.     Vascular: No carotid bruit or JVD.  Cardiovascular:     Rate and Rhythm: Normal rate and regular rhythm.     Heart sounds: Normal heart sounds.     No gallop.  Pulmonary:     Effort: Pulmonary effort is normal. No respiratory distress.     Breath sounds: Normal breath sounds. No wheezing or rales.  Abdominal:     General: There is no distension  or abdominal bruit.     Palpations: Abdomen is soft. There is no mass.     Tenderness: There is no abdominal tenderness. There is no right CVA tenderness, left CVA tenderness, guarding or rebound.  Musculoskeletal:     Cervical back: Normal range of motion and neck supple.     Right lower leg: No edema.     Left lower leg: No edema.  Lymphadenopathy:     Cervical: No cervical adenopathy.  Skin:    General: Skin is warm and dry.     Coloration: Skin is not pale.     Findings: No rash.  Neurological:     Mental Status: She is alert.     Coordination: Coordination normal.     Deep Tendon Reflexes: Reflexes are normal and symmetric. Reflexes normal.  Psychiatric:        Mood and Affect: Mood normal.           Assessment & Plan:   Problem List Items Addressed This Visit       Genitourinary   Prolapse of female pelvic organs   Relevant Orders   Ambulatory referral to Urogynecology     Other   Constipation -  Primary    Worsened by pelvic prolapse (pending poss surgical tx)   Disc options  Will try and get more fruits/veg/fiber  Trial of miralax mixed with 3 beverages per day-disc that this may take several days to work Then titrate to need  Watch for abd pain /n/v or other symptoms  Update if not starting to improve in a week or if worsening        Hyponatremia    Recommending miralax for constipation so this will need to be watched  Takes chlorthalidone 3 d weekly  Last na 131  Will re check this in 2 wk and make sure it is not going down       Other Visit Diagnoses     Need for influenza vaccination       Relevant Orders   Flu Vaccine QUAD High Dose(Fluad) (Completed)

## 2021-12-14 NOTE — Assessment & Plan Note (Signed)
Recommending miralax for constipation so this will need to be watched  Takes chlorthalidone 3 d weekly  Last na 131  Will re check this in 2 wk and make sure it is not going down

## 2021-12-14 NOTE — Patient Instructions (Addendum)
Try mixing miralax into 3 of your beverages daily  It takes 2-3 days to start working  Once improved you can titrate as needed   Magnesium helps also   I will work on a uro gyn referral   Let's check your sodium level in about 2 weeks    Flu shot today

## 2021-12-17 ENCOUNTER — Other Ambulatory Visit: Payer: Self-pay | Admitting: Interventional Cardiology

## 2021-12-27 ENCOUNTER — Telehealth: Payer: Self-pay | Admitting: Family Medicine

## 2021-12-27 DIAGNOSIS — E871 Hypo-osmolality and hyponatremia: Secondary | ICD-10-CM

## 2021-12-27 DIAGNOSIS — E119 Type 2 diabetes mellitus without complications: Secondary | ICD-10-CM

## 2021-12-27 NOTE — Telephone Encounter (Signed)
-----   Message from Ellamae Sia sent at 12/17/2021  3:48 PM EDT ----- Regarding: .Lab orders for Monday, 11.6.23 Lab orders,BMP? thanks

## 2021-12-28 ENCOUNTER — Other Ambulatory Visit (INDEPENDENT_AMBULATORY_CARE_PROVIDER_SITE_OTHER): Payer: Medicare PPO

## 2021-12-28 DIAGNOSIS — E119 Type 2 diabetes mellitus without complications: Secondary | ICD-10-CM | POA: Diagnosis not present

## 2021-12-28 DIAGNOSIS — E871 Hypo-osmolality and hyponatremia: Secondary | ICD-10-CM | POA: Diagnosis not present

## 2021-12-28 LAB — BASIC METABOLIC PANEL
BUN: 16 mg/dL (ref 6–23)
CO2: 30 mEq/L (ref 19–32)
Calcium: 9.7 mg/dL (ref 8.4–10.5)
Chloride: 93 mEq/L — ABNORMAL LOW (ref 96–112)
Creatinine, Ser: 0.73 mg/dL (ref 0.40–1.20)
GFR: 77.24 mL/min (ref >60.00)
Glucose, Bld: 148 mg/dL — ABNORMAL HIGH (ref 70–99)
Potassium: 4.1 mEq/L (ref 3.5–5.1)
Sodium: 131 mEq/L — ABNORMAL LOW (ref 135–145)

## 2021-12-28 LAB — HEMOGLOBIN A1C: Hgb A1c MFr Bld: 7.5 % — ABNORMAL HIGH (ref 4.6–6.5)

## 2021-12-30 ENCOUNTER — Telehealth: Payer: Self-pay | Admitting: *Deleted

## 2021-12-30 MED ORDER — BLOOD GLUCOSE METER KIT: PACK | 1 refills | Status: AC

## 2021-12-30 NOTE — Telephone Encounter (Signed)
-----   Message from Judy Pimple, MD sent at 12/28/2021  7:54 PM EST ----- A1c is up to 7.5 -this is high  Is she open to starting to monitor glucose? If so I will send equp to her pref pharmacy and schedule a f/u in a month (can make a nurse visit appt to learn how to use if needed) Watch diet carefully Try to get most of your carbohydrates from produce (with the exception of white potatoes)  Eat less bread/pasta/rice/snack foods/cereals/sweets and other items from the middle of the grocery store (processed carbs)  Sodium level is the same  We will continue to watch it

## 2021-12-30 NOTE — Telephone Encounter (Signed)
Pt notified of lab results and Dr. Royden Purl comments. Pt agreed with sending DM supplies into pharmacy. Pt uses [facility]. Also 1 month f/u appt scheduled with PCP

## 2022-01-04 DIAGNOSIS — S233XXA Sprain of ligaments of thoracic spine, initial encounter: Secondary | ICD-10-CM | POA: Diagnosis not present

## 2022-01-04 DIAGNOSIS — M9902 Segmental and somatic dysfunction of thoracic region: Secondary | ICD-10-CM | POA: Diagnosis not present

## 2022-01-05 ENCOUNTER — Telehealth: Payer: Self-pay | Admitting: Family Medicine

## 2022-01-05 ENCOUNTER — Other Ambulatory Visit: Payer: Self-pay

## 2022-01-05 MED ORDER — GLUCOSE BLOOD VI STRP: ORAL_STRIP | 12 refills | Status: AC

## 2022-01-05 MED ORDER — ACCU-CHEK SOFTCLIX LANCETS MISC: 12 refills | Status: DC

## 2022-01-05 NOTE — Telephone Encounter (Signed)
I have scheduled pt a nurse visit appt for tomorrow at 3:45 to get assistance with her machine.

## 2022-01-05 NOTE — Telephone Encounter (Signed)
Patient called in stating she received her glucose monitor this past weekend. She is needing some assistance with setting it up and had some questions. Please advise. Thank you!

## 2022-01-06 ENCOUNTER — Ambulatory Visit: Payer: Medicare PPO

## 2022-01-06 ENCOUNTER — Telehealth: Payer: Self-pay | Admitting: Pharmacist

## 2022-01-06 NOTE — Telephone Encounter (Signed)
Patient came to office today for glucometer training. She has Accu Check meter but did not pick up lancets and test strips yet. Demonstrated how to set up and use lancing device. Used clinic glucometer to demonstrate how to insert test strip and apply blood to test. Patient successfully tested blood sugar in office today and it was 121.  Advised patient to pick up lancets and test strips from pharmacy today - per chart these [facility] yesterday.

## 2022-01-16 ENCOUNTER — Other Ambulatory Visit: Payer: Self-pay | Admitting: Interventional Cardiology

## 2022-01-18 DIAGNOSIS — M9902 Segmental and somatic dysfunction of thoracic region: Secondary | ICD-10-CM | POA: Diagnosis not present

## 2022-01-18 DIAGNOSIS — S233XXA Sprain of ligaments of thoracic spine, initial encounter: Secondary | ICD-10-CM | POA: Diagnosis not present

## 2022-01-26 ENCOUNTER — Ambulatory Visit: Payer: Medicare PPO | Admitting: Interventional Cardiology

## 2022-01-28 ENCOUNTER — Ambulatory Visit: Payer: Medicare PPO | Admitting: Internal Medicine

## 2022-01-28 ENCOUNTER — Other Ambulatory Visit: Payer: Self-pay | Admitting: Internal Medicine

## 2022-01-28 NOTE — Telephone Encounter (Signed)
Singulair refilled.

## 2022-02-01 ENCOUNTER — Ambulatory Visit: Payer: Medicare PPO | Admitting: Family Medicine

## 2022-02-01 ENCOUNTER — Encounter: Payer: Self-pay | Admitting: Family Medicine

## 2022-02-01 VITALS — BP 148/85 | HR 94 | Temp 97.3°F | Ht 66.0 in | Wt 168.5 lb

## 2022-02-01 DIAGNOSIS — I10 Essential (primary) hypertension: Secondary | ICD-10-CM | POA: Diagnosis not present

## 2022-02-01 DIAGNOSIS — S233XXA Sprain of ligaments of thoracic spine, initial encounter: Secondary | ICD-10-CM | POA: Diagnosis not present

## 2022-02-01 DIAGNOSIS — E119 Type 2 diabetes mellitus without complications: Secondary | ICD-10-CM

## 2022-02-01 DIAGNOSIS — M9902 Segmental and somatic dysfunction of thoracic region: Secondary | ICD-10-CM | POA: Diagnosis not present

## 2022-02-01 DIAGNOSIS — E871 Hypo-osmolality and hyponatremia: Secondary | ICD-10-CM | POA: Diagnosis not present

## 2022-02-01 MED ORDER — METFORMIN HCL 1000 MG PO TABS: 1000.0000 mg | ORAL_TABLET | Freq: Two times a day (BID) | ORAL | 3 refills | Status: DC

## 2022-02-01 NOTE — Progress Notes (Signed)
Subjective:    Patient ID: Jordan Patterson, female    DOB: 1940/11/17, 81 y.o.   MRN: [id number]  HPI Pt presents for follow up of DM2  Wt Readings from Last 3 Encounters:  02/01/22 168 lb 8 oz (76.4 kg)  12/14/21 165 lb 6 oz (75 kg)  12/01/21 169 lb 3.2 oz (76.7 kg)   27.20 kg/m    Last visit A1c was up to 7.5  Planned to set her up with glucose test equip- had visit with pharmacist to learn how to use   Having trouble checking her blood sugar  Her fingers are cold  Has to rub them or use warm water   Usually runs around the one teens to low 120s  Has not checked later in the day   Is cautious with her diet -tries not to overdo it   Eats a little sweet stuff once per day  Not a lot of bread /pasta and rice  Lots of produce  ? Enough protein  Hard cheeses  Some meat- chicken /some beef  Almond milk  Some beans  Occ nuts and nut butters   She did have a nutrition class years ago and it was very helpful    Lab Results  Component Value Date   HGBA1C 7.5 (H) 12/28/2021    Metformin 500 mg bid    Due for urine micro  Eye exam : has appt in January  Has macular degeneration as well   On arb  No statin- is intolerant   Lab Results  Component Value Date   CREATININE 0.73 12/28/2021   BUN 16 12/28/2021   NA 131 (L) 12/28/2021   K 4.1 12/28/2021   CL 93 (L) 12/28/2021   CO2 30 12/28/2021  GFR 77.2  Monitoring sodium level  Takes chlorthalidone from cardiology 25 mg daily  HTN bp is stable today  No cp or palpitations or headaches or edema  No side effects to medicines  BP Readings from Last 3 Encounters:  02/01/22 (!) 148/85  12/14/21 136/78  12/01/21 128/76    Pulse Readings from Last 3 Encounters:  02/01/22 94  12/14/21 (!) 105  12/01/21 97     Amlodipine 5 mg daily  Chlorthalidone 25 mg daily  Valsartan 320 mg daily   Cardiologist retired Dr Tamala Julian  We will take over her bp medicine   Patient Active Problem List   Diagnosis Date  Noted   Constipation 12/14/2021   Preoperative cardiovascular examination 11/09/2021   Cough 11/04/2021   Digital mucinous cyst of finger 08/13/2021   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   Vaginal spotting 04/07/2021   Estrogen deficiency 04/07/2021   Thoracic compression fracture ([place]) 01/22/2021   Compression fracture of T11 vertebra ([place]) 12/15/2020   Current use of proton pump inhibitor 03/11/2020   Chronic midline low back pain without sciatica 03/12/2019   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   Hyponatremia 12/14/2018   Laryngopharyngeal reflux (LPR) 08/24/2017   Osteoporosis 08/24/2017   Leg cramping 08/21/2017   Regurgitation of food    Prolapse of female pelvic organs 02/19/2017   History of compression fracture of spine 07/18/2014   Hypertensive retinopathy of both eyes 06/04/2014   Nonexudative age-related macular degeneration 06/04/2014   Posterior vitreous detachment of both eyes 06/04/2014   Pseudophakia of both eyes 06/04/2014   Right knee pain 05/30/2014   Advanced care planning/counseling discussion 01/24/2014   Health maintenance examination 01/24/2014   Dysgeusia 11/12/2013   Upper airway cough syndrome 03/19/2013  Allergic conjunctivitis and rhinitis 05/15/2012   Asthma, moderate persistent 03/09/2012   Medicare annual wellness visit, subsequent 01/14/2012   Hiatal hernia    GERD (gastroesophageal reflux disease)    Essential hypertension    Hyperlipidemia associated with type 2 diabetes mellitus ([place])    Depression    Hypothyroidism    Diabetes type 2, controlled ([place])    Osteoarthritis    Past Medical History:  Diagnosis Date   Allergic rhinitis    Arthritis    hands and knees - ?osteo   Asthma    Chronic headaches    Compression fracture of T12 vertebra ([place]) 07/18/2014   S/p kyphoplasty by Dr Sherwood Gambler (08/2014)    Depression    pt denies   Diabetes type 2, controlled ([place]) 2004   Environmental allergies    dust,mold,mildew   Extrinsic asthma    GERD  (gastroesophageal reflux disease)    Olevia Perches) EGD - mild esophageal dysmotility and small hiatal hernia   Hiatal hernia    Hx of migraines    Hyperlipidemia    mild, diet controlled   Hypertension    Hypothyroidism    Macular degeneration    Osteoporosis with fracture 07/2014   T -1.2 hip, T 0.2 spine, T12 compression fracture s/p kyphoplasty   Past Surgical History:  Procedure Laterality Date   12 HOUR [place] STUDY N/A 04/11/2017   Procedure: 24 HOUR PH STUDY;  Surgeon: Mauri Pole, MD;  Location: [facility];  Service: Endoscopy;  Laterality: N/A;   BACK SURGERY     CATARACT EXTRACTION  02/22/2001   bilateral with lens implant   COLONOSCOPY     DEXA  05/23/2008   improved (initial DEXA 2007 - mild osteopenia)   DILATION AND CURETTAGE OF UTERUS  02/23/1976   ESOPHAGEAL MANOMETRY N/A 04/11/2017   Procedure: ESOPHAGEAL MANOMETRY (EM);  Surgeon: Mauri Pole, MD;  Location: [facility];  Service: Endoscopy;  Laterality: N/A;   ESOPHAGOGASTRODUODENOSCOPY  06/25/2011   Procedure: ESOPHAGOGASTRODUODENOSCOPY (EGD);  Surgeon: Lafayette Dragon, MD;  Location: [facility];  Service: Endoscopy;  Laterality: N/A;  no Xray   EYE SURGERY     KYPHOPLASTY N/A 08/30/2014   Procedure: THORACIC TWELVE KYPHOPLASTY;  Surgeon: Jovita Gamma, MD   KYPHOPLASTY N/A 01/22/2021   Procedure: Thoracic eight, Thoracic nine KYPHOPLASTY;  Surgeon: Ashok Pall, MD;  Location: [place] [facility];  Service: Neurosurgery;  Laterality: N/A;   LAPAROSCOPY ABDOMEN DIAGNOSTIC     To R/O endometriosis   PH IMPEDANCE STUDY N/A 04/11/2017   Procedure: [place] IMPEDANCE STUDY;  Surgeon: Mauri Pole, MD;  Location: [facility];  Service: Endoscopy;  Laterality: N/A;   SAVORY DILATION  06/25/2011   Procedure: SAVORY DILATION;  Surgeon: Lafayette Dragon, MD;  Location: [facility];  Service: Endoscopy;  Laterality: N/A;   TONSILLECTOMY AND ADENOIDECTOMY  02/23/1960   US ECHOCARDIOGRAPHY  96/78/9381   nl systolic  function EF 01%, mild diastolic dysfunction   Social History   Tobacco Use   Smoking status: Never    Passive exposure: Never   Smokeless tobacco: Never  Vaping Use   Vaping Use: Never used  Substance Use Topics   Alcohol use: No    Alcohol/week: 0.0 standard drinks of alcohol   Drug use: No   Family History  Problem Relation Age of Onset   Mitral valve prolapse Father    Diabetes Father    Mitral valve prolapse Mother    Hypertension Mother    Stroke Mother 40  after valve surgery   Parkinson's disease Brother    Other Brother        POST WAR TRAUMA   Stroke Paternal Grandfather    Heart failure Maternal Grandfather    Cancer Neg Hx    Colon cancer Neg Hx    Stomach cancer Neg Hx    Esophageal cancer Neg Hx    Pancreatic cancer Neg Hx    Liver disease Neg Hx    Allergies  Allergen Reactions   Ace Inhibitors Cough   Crestor [Rosuvastatin] Other (See Comments)    myalgias   Wellbutrin [Bupropion Hcl] Other (See Comments)    Unknown reaction   Cymbalta [Duloxetine Hcl] Palpitations and Other (See Comments)    headaches   Lipitor [Atorvastatin] Cough   Pregabalin Other (See Comments)    Unknown reaction   Tegretol [Carbamazepine] Other (See Comments)    Dizziness, headache   Current Outpatient Medications on File Prior to Visit  Medication Sig Dispense Refill   Accu-Chek Softclix Lancets lancets Use as instructed 100 each 12   acetaminophen (TYLENOL) 325 MG tablet Take 650 mg by mouth every 6 (six) hours as needed.     albuterol (VENTOLIN HFA) 108 (90 Base) MCG/ACT inhaler TAKE 2 PUFFS BY MOUTH EVERY 6 HOURS AS NEEDED FOR WHEEZE OR SHORTNESS OF BREATH 18 g 11   amLODipine (NORVASC) 5 MG tablet TAKE 1 TABLET (5 MG TOTAL) BY MOUTH DAILY. 90 tablet 1   aspirin 81 MG tablet Take 81 mg by mouth daily.     blood glucose meter kit and supplies Dispense based on patient and insurance preference. Use qd and prn for uncontrolled diabetes. (FOR ICD-10 E10.9, E11.9). 1  each 1   Budeson-Glycopyrrol-Formoterol (BREZTRI AEROSPHERE) 160-9-4.8 MCG/ACT AERO TAKE 2 PUFFS BY MOUTH TWICE A DAY 32.1 g 3   Calcium 250 MG CAPS Take 500 mg by mouth in the morning and at bedtime.     chlorpheniramine (CHLOR-TRIMETON) 4 MG tablet Take 4 mg by mouth in the morning, at noon, and at bedtime.     chlorthalidone (HYGROTON) 25 MG tablet TAKE 1 TABLET (25 MG TOTAL) BY MOUTH DAILY. 90 tablet 3   Cholecalciferol (VITAMIN D3) 1000 units CAPS Take 1 capsule (1,000 Units total) by mouth daily. 30 capsule    esomeprazole (NEXIUM) 40 MG capsule 1 capsule by mouth every day before breakfast 90 capsule 6   famotidine (PEPCID) 20 MG tablet TAKE 1 TABLET BY MOUTH EVERYDAY AT BEDTIME 90 tablet 6   ferrous sulfate 325 (65 FE) MG EC tablet Take 325 mg by mouth every Monday, Wednesday, and Friday.     Garlic (GARLIQUE PO) Take 1 tablet by mouth daily.     glucose blood test strip Use as instructed 100 each 12   levothyroxine (SYNTHROID) 100 MCG tablet TAKE 1 TABLET BY MOUTH EVERY DAY BEFORE BREAKFAST 90 tablet 2   MAGNESIUM PO Take 2 tablets by mouth daily.     montelukast (SINGULAIR) 10 MG tablet TAKE 1 TABLET BY MOUTH EVERYDAY AT BEDTIME 90 tablet 4   Multiple Vitamins-Minerals (VISION HEALTH PO) Take by mouth.     Nutritional Supplements (GLUCOSE MANAGEMENT PO) Take by mouth.     Omega-3 Fatty Acids (OMEGA 3 500 PO) Take 1 capsule by mouth in the morning and at bedtime.     polyethylene glycol (MIRALAX / GLYCOLAX) packet Take 17 g by mouth 2 (two) times a week.     Polyvinyl Alcohol-Povidone (REFRESH OP) Place 1 drop into  both eyes daily.      Specialty Vitamins Products (CARDIOVASCULAR SUPPORT PO) Take 2 capsules by mouth daily.     valsartan (DIOVAN) 320 MG tablet TAKE 1 TABLET BY MOUTH EVERY DAY 90 tablet 3   No current facility-administered medications on file prior to visit.    Review of Systems  Constitutional:  Negative for activity change, appetite change, fatigue, fever and  unexpected weight change.  HENT:  Negative for congestion, ear pain, rhinorrhea, sinus pressure and sore throat.   Eyes:  Negative for pain, redness and visual disturbance.  Respiratory:  Negative for cough, shortness of breath and wheezing.   Cardiovascular:  Negative for chest pain and palpitations.  Gastrointestinal:  Negative for abdominal pain, blood in stool, constipation and diarrhea.  Endocrine: Negative for polydipsia and polyuria.  Genitourinary:  Negative for dysuria, frequency and urgency.  Musculoskeletal:  Positive for back pain. Negative for arthralgias and myalgias.  Skin:  Negative for pallor and rash.  Allergic/Immunologic: Negative for environmental allergies.  Neurological:  Negative for dizziness, syncope and headaches.  Hematological:  Negative for adenopathy. Does not bruise/bleed easily.  Psychiatric/Behavioral:  Negative for decreased concentration and dysphoric mood. The patient is not nervous/anxious.        Objective:   Physical Exam Constitutional:      General: She is not in acute distress.    Appearance: Normal appearance. She is well-developed and normal weight. She is not ill-appearing or diaphoretic.  HENT:     Head: Normocephalic and atraumatic.  Eyes:     General: No scleral icterus.    Conjunctiva/sclera: Conjunctivae normal.     Pupils: Pupils are equal, round, and reactive to light.  Neck:     Thyroid: No thyromegaly.     Vascular: No carotid bruit or JVD.  Cardiovascular:     Rate and Rhythm: Normal rate and regular rhythm.     Heart sounds: Normal heart sounds.     No gallop.  Pulmonary:     Effort: Pulmonary effort is normal. No respiratory distress.     Breath sounds: Normal breath sounds. No wheezing or rales.  Abdominal:     General: There is no distension or abdominal bruit.     Palpations: Abdomen is soft.  Musculoskeletal:     Cervical back: Normal range of motion and neck supple.     Right lower leg: No edema.     Left lower  leg: No edema.     Comments: Wearing back brace   Lymphadenopathy:     Cervical: No cervical adenopathy.  Skin:    General: Skin is warm and dry.     Coloration: Skin is not pale.     Findings: No rash.  Neurological:     Mental Status: She is alert.     Coordination: Coordination normal.     Deep Tendon Reflexes: Reflexes are normal and symmetric. Reflexes normal.  Psychiatric:        Mood and Affect: Mood normal.           Assessment & Plan:   Problem List Items Addressed This Visit       Cardiovascular and Mediastinum   Essential hypertension    bp is up today Will f/u in feb  BP Readings from Last 1 Encounters:  02/01/22 (!) 148/85  No changes needed Most recent labs reviewed  Disc lifstyle change with low sodium diet and exercise  Taking Amlodipine 5 mg daily  Chlorthalidone 25 mg daily  Valsartan 320  mg daily   Watching sodium level  F/u feb with lab prior as well          Endocrine   Diabetes type 2, controlled ([place]) - Primary    Worsened control recently  Lab Results  Component Value Date   HGBA1C 7.5 (H) 12/28/2021   Good am glucose readings Inst to start checking pp -alt mid day and end day Rev diet and protein sources  Eye exam planned in January  On arb Statin intol   Plan to inc metformin to 1000 mg bid with f/u in feb as well as labs  Rev poss side eff  Watching sodium level also  Takes chlorthalidone for HTN      Relevant Medications   metFORMIN (GLUCOPHAGE) 1000 MG tablet     Other   Hyponatremia    Last na stable at 131 If this goes down more may need to change cholorthalidone   Will re check in feb

## 2022-02-01 NOTE — Assessment & Plan Note (Signed)
Last na stable at 131 If this goes down more may need to change cholorthalidone   Will re check in feb

## 2022-02-01 NOTE — Assessment & Plan Note (Signed)
bp is up today Will f/u in feb  BP Readings from Last 1 Encounters:  02/01/22 (!) 148/85   No changes needed Most recent labs reviewed  Disc lifstyle change with low sodium diet and exercise  Taking Amlodipine 5 mg daily  Chlorthalidone 25 mg daily  Valsartan 320 mg daily   Watching sodium level  F/u feb with lab prior as well

## 2022-02-01 NOTE — Assessment & Plan Note (Signed)
Worsened control recently  Lab Results  Component Value Date   HGBA1C 7.5 (H) 12/28/2021    Good am glucose readings Inst to start checking pp -alt mid day and end day Rev diet and protein sources  Eye exam planned in January  On arb Statin intol   Plan to inc metformin to 1000 mg bid with f/u in feb as well as labs  Rev poss side eff  Watching sodium level also  Takes chlorthalidone for HTN

## 2022-02-01 NOTE — Patient Instructions (Addendum)
Start checking blood glucose 2 hours after a meal   Alternate lunch and dinner   Try to get most of your carbohydrates from produce (with the exception of white potatoes)  Eat less bread/pasta/rice/snack foods/cereals/sweets and other items from the middle of the grocery store (processed carbs)  Go ahead and increase metformin to 1000 mg twice daily     Next time let's get a urine sample for protein   Follow up after feb 6th   Take care of yourself

## 2022-02-05 ENCOUNTER — Encounter: Payer: Self-pay | Admitting: Family Medicine

## 2022-02-09 ENCOUNTER — Other Ambulatory Visit (INDEPENDENT_AMBULATORY_CARE_PROVIDER_SITE_OTHER): Payer: Medicare PPO

## 2022-02-09 ENCOUNTER — Telehealth: Payer: Self-pay | Admitting: Family Medicine

## 2022-02-09 DIAGNOSIS — R350 Frequency of micturition: Secondary | ICD-10-CM | POA: Diagnosis not present

## 2022-02-09 LAB — POC URINALSYSI DIPSTICK (AUTOMATED)
Bilirubin, UA: NEGATIVE
Blood, UA: 50
Glucose, UA: NEGATIVE
Ketones, UA: NEGATIVE
Nitrite, UA: NEGATIVE
Protein, UA: POSITIVE — AB
Spec Grav, UA: 1.01 (ref 1.010–1.025)
Urobilinogen, UA: 0.2 E.U./dL
pH, UA: 6 (ref 5.0–8.0)

## 2022-02-09 NOTE — Telephone Encounter (Signed)
I put in orders for the drop off Am out of the office now and unsure when I will be back on line   Thanks   I will see her for her appt as planned

## 2022-02-09 NOTE — Telephone Encounter (Signed)
Pt called in requesting a call back stated took home urine test . Would like to drop it off for possible UTI . Pt has appointment on 02/11/22 . Please advise [phone]

## 2022-02-09 NOTE — Telephone Encounter (Signed)
Pt will drop off urine sample today.  Added to lab schedule so we can document

## 2022-02-11 ENCOUNTER — Encounter: Payer: Self-pay | Admitting: Family Medicine

## 2022-02-11 ENCOUNTER — Ambulatory Visit: Payer: Medicare PPO | Admitting: Family Medicine

## 2022-02-11 VITALS — BP 144/72 | HR 92 | Temp 97.2°F | Ht 66.0 in | Wt 167.0 lb

## 2022-02-11 DIAGNOSIS — N819 Female genital prolapse, unspecified: Secondary | ICD-10-CM

## 2022-02-11 DIAGNOSIS — N3000 Acute cystitis without hematuria: Secondary | ICD-10-CM | POA: Insufficient documentation

## 2022-02-11 MED ORDER — CEPHALEXIN 500 MG PO CAPS: 500.0000 mg | ORAL_CAPSULE | Freq: Two times a day (BID) | ORAL | 0 refills | Status: DC

## 2022-02-11 NOTE — Progress Notes (Signed)
Subjective:    Patient ID: Jordan Patterson, female    DOB: 1940/12/24, 81 y.o.   MRN: [id number]  HPI Pt presents for urinary symptoms   Wt Readings from Last 3 Encounters:  02/11/22 167 lb (75.8 kg)  02/01/22 168 lb 8 oz (76.4 kg)  12/14/21 165 lb 6 oz (75 kg)   26.95 kg/m   Having trouble with bladder prolapse (had surgery planned and had to cancel it)  Has appt with uro gyn Dr Wannetta Sender in march   Noted bladder started to feel more irritated and uncomfortable   Started last weekend  More uncomfortable - discomfort before she urinates  Some urine hesitancy with large bladder prolapse   Has freq urge to urinate  She has urgency and sometimes wets before she can get there  No burning during urination (is just before)   Bladder feels irritated  Hurts to stand and to walk  She tried to use some vagisil   Took some uri calm cranberry   No kidney problems [place] was ok  Renal labs are normal     She dropped off urine sample Results for orders placed or performed in visit on 02/09/22  Urine Culture   Specimen: Urine  Result Value Ref Range   MICRO NUMBER: [id number]    SPECIMEN QUALITY: Adequate    Sample Source URINE    STATUS: PRELIMINARY    ISOLATE 1: Gram negative bacilli isolated (A)   POCT Urinalysis Dipstick (Automated)  Result Value Ref Range   Color, UA Yellow    Clarity, UA Cloudy    Glucose, UA Negative Negative   Bilirubin, UA Negative    Ketones, UA Negative    Spec Grav, UA 1.010 1.010 - 1.025   Blood, UA 50 Ery/uL    pH, UA 6.0 5.0 - 8.0   Protein, UA Positive (A) Negative   Urobilinogen, UA 0.2 0.2 or 1.0 E.U./dL   Nitrite, UA Negative    Leukocytes, UA Large (3+) (A) Negative   Gm neg bacilli prelim   Last renal labs Lab Results  Component Value Date   CREATININE 0.73 12/28/2021   BUN 16 12/28/2021   NA 131 (L) 12/28/2021   K 4.1 12/28/2021   CL 93 (L) 12/28/2021   CO2 30 12/28/2021   GFR 77.2   Patient Active Problem List    Diagnosis Date Noted   Acute cystitis 02/11/2022   Frequent urination 02/09/2022   Constipation 12/14/2021   Preoperative cardiovascular examination 11/09/2021   Cough 11/04/2021   Digital mucinous cyst of finger 08/13/2021   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   Vaginal spotting 04/07/2021   Estrogen deficiency 04/07/2021   Thoracic compression fracture (HCC) 01/22/2021   Compression fracture of T11 vertebra (HCC) 12/15/2020   Current use of proton pump inhibitor 03/11/2020   Chronic midline low back pain without sciatica 03/12/2019   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   Hyponatremia 12/14/2018   Laryngopharyngeal reflux (LPR) 08/24/2017   Osteoporosis 08/24/2017   Leg cramping 08/21/2017   Regurgitation of food    Prolapse of female pelvic organs 02/19/2017   History of compression fracture of spine 07/18/2014   Hypertensive retinopathy of both eyes 06/04/2014   Nonexudative age-related macular degeneration 06/04/2014   Posterior vitreous detachment of both eyes 06/04/2014   Pseudophakia of both eyes 06/04/2014   Right knee pain 05/30/2014   Advanced care planning/counseling discussion 01/24/2014   Health maintenance examination 01/24/2014   Dysgeusia 11/12/2013   Upper airway cough syndrome 03/19/2013  Allergic conjunctivitis and rhinitis 05/15/2012   Asthma, moderate persistent 03/09/2012   Medicare annual wellness visit, subsequent 01/14/2012   Hiatal hernia    GERD (gastroesophageal reflux disease)    Essential hypertension    Hyperlipidemia associated with type 2 diabetes mellitus ([place])    Depression    Hypothyroidism    Diabetes type 2, controlled ([place])    Osteoarthritis    Past Medical History:  Diagnosis Date   Allergic rhinitis    Arthritis    hands and knees - ?osteo   Asthma    Chronic headaches    Compression fracture of T12 vertebra ([place]) 07/18/2014   S/p kyphoplasty by Dr Sherwood Gambler (08/2014)    Depression    pt denies   Diabetes type 2, controlled ([place]) 2004    Environmental allergies    dust,mold,mildew   Extrinsic asthma    GERD (gastroesophageal reflux disease)    Olevia Perches) EGD - mild esophageal dysmotility and small hiatal hernia   Hiatal hernia    Hx of migraines    Hyperlipidemia    mild, diet controlled   Hypertension    Hypothyroidism    Macular degeneration    Osteoporosis with fracture 07/2014   T -1.2 hip, T 0.2 spine, T12 compression fracture s/p kyphoplasty   Past Surgical History:  Procedure Laterality Date   60 HOUR [place] STUDY N/A 04/11/2017   Procedure: 24 HOUR PH STUDY;  Surgeon: Mauri Pole, MD;  Location: [facility];  Service: Endoscopy;  Laterality: N/A;   BACK SURGERY     CATARACT EXTRACTION  02/22/2001   bilateral with lens implant   COLONOSCOPY     DEXA  05/23/2008   improved (initial DEXA 2007 - mild osteopenia)   DILATION AND CURETTAGE OF UTERUS  02/23/1976   ESOPHAGEAL MANOMETRY N/A 04/11/2017   Procedure: ESOPHAGEAL MANOMETRY (EM);  Surgeon: Mauri Pole, MD;  Location: [facility];  Service: Endoscopy;  Laterality: N/A;   ESOPHAGOGASTRODUODENOSCOPY  06/25/2011   Procedure: ESOPHAGOGASTRODUODENOSCOPY (EGD);  Surgeon: Lafayette Dragon, MD;  Location: [facility];  Service: Endoscopy;  Laterality: N/A;  no Xray   EYE SURGERY     KYPHOPLASTY N/A 08/30/2014   Procedure: THORACIC TWELVE KYPHOPLASTY;  Surgeon: Jovita Gamma, MD   KYPHOPLASTY N/A 01/22/2021   Procedure: Thoracic eight, Thoracic nine KYPHOPLASTY;  Surgeon: Ashok Pall, MD;  Location: [facility];  Service: Neurosurgery;  Laterality: N/A;   LAPAROSCOPY ABDOMEN DIAGNOSTIC     To R/O endometriosis   PH IMPEDANCE STUDY N/A 04/11/2017   Procedure: [place] IMPEDANCE STUDY;  Surgeon: Mauri Pole, MD;  Location: [facility];  Service: Endoscopy;  Laterality: N/A;   SAVORY DILATION  06/25/2011   Procedure: SAVORY DILATION;  Surgeon: Lafayette Dragon, MD;  Location: [facility];  Service: Endoscopy;  Laterality: N/A;   TONSILLECTOMY AND  ADENOIDECTOMY  02/23/1960   US ECHOCARDIOGRAPHY  11/94/1740   nl systolic function EF 81%, mild diastolic dysfunction   Social History   Tobacco Use   Smoking status: Never    Passive exposure: Never   Smokeless tobacco: Never  Vaping Use   Vaping Use: Never used  Substance Use Topics   Alcohol use: No    Alcohol/week: 0.0 standard drinks of alcohol   Drug use: No   Family History  Problem Relation Age of Onset   Mitral valve prolapse Father    Diabetes Father    Mitral valve prolapse Mother    Hypertension Mother    Stroke Mother 3  after valve surgery   Parkinson's disease Brother    Other Brother        POST WAR TRAUMA   Stroke Paternal Grandfather    Heart failure Maternal Grandfather    Cancer Neg Hx    Colon cancer Neg Hx    Stomach cancer Neg Hx    Esophageal cancer Neg Hx    Pancreatic cancer Neg Hx    Liver disease Neg Hx    Allergies  Allergen Reactions   Ace Inhibitors Cough   Crestor [Rosuvastatin] Other (See Comments)    myalgias   Wellbutrin [Bupropion Hcl] Other (See Comments)    Unknown reaction   Cymbalta [Duloxetine Hcl] Palpitations and Other (See Comments)    headaches   Lipitor [Atorvastatin] Cough   Pregabalin Other (See Comments)    Unknown reaction   Tegretol [Carbamazepine] Other (See Comments)    Dizziness, headache   Current Outpatient Medications on File Prior to Visit  Medication Sig Dispense Refill   Accu-Chek Softclix Lancets lancets Use as instructed 100 each 12   acetaminophen (TYLENOL) 325 MG tablet Take 650 mg by mouth every 6 (six) hours as needed.     albuterol (VENTOLIN HFA) 108 (90 Base) MCG/ACT inhaler TAKE 2 PUFFS BY MOUTH EVERY 6 HOURS AS NEEDED FOR WHEEZE OR SHORTNESS OF BREATH 18 g 11   amLODipine (NORVASC) 5 MG tablet TAKE 1 TABLET (5 MG TOTAL) BY MOUTH DAILY. 90 tablet 1   aspirin 81 MG tablet Take 81 mg by mouth daily.     blood glucose meter kit and supplies Dispense based on patient and insurance  preference. Use qd and prn for uncontrolled diabetes. (FOR ICD-10 E10.9, E11.9). 1 each 1   Budeson-Glycopyrrol-Formoterol (BREZTRI AEROSPHERE) 160-9-4.8 MCG/ACT AERO TAKE 2 PUFFS BY MOUTH TWICE A DAY 32.1 g 3   Calcium 250 MG CAPS Take 500 mg by mouth in the morning and at bedtime.     chlorpheniramine (CHLOR-TRIMETON) 4 MG tablet Take 4 mg by mouth in the morning, at noon, and at bedtime.     chlorthalidone (HYGROTON) 25 MG tablet TAKE 1 TABLET (25 MG TOTAL) BY MOUTH DAILY. 90 tablet 3   Cholecalciferol (VITAMIN D3) 1000 units CAPS Take 1 capsule (1,000 Units total) by mouth daily. 30 capsule    esomeprazole (NEXIUM) 40 MG capsule 1 capsule by mouth every day before breakfast 90 capsule 6   famotidine (PEPCID) 20 MG tablet TAKE 1 TABLET BY MOUTH EVERYDAY AT BEDTIME 90 tablet 6   ferrous sulfate 325 (65 FE) MG EC tablet Take 325 mg by mouth every Monday, Wednesday, and Friday.     Garlic (GARLIQUE PO) Take 1 tablet by mouth daily.     glucose blood test strip Use as instructed 100 each 12   levothyroxine (SYNTHROID) 100 MCG tablet TAKE 1 TABLET BY MOUTH EVERY DAY BEFORE BREAKFAST 90 tablet 2   MAGNESIUM PO Take 2 tablets by mouth daily.     metFORMIN (GLUCOPHAGE) 1000 MG tablet Take 1 tablet (1,000 mg total) by mouth 2 (two) times daily with a meal. 180 tablet 3   montelukast (SINGULAIR) 10 MG tablet TAKE 1 TABLET BY MOUTH EVERYDAY AT BEDTIME 90 tablet 4   Multiple Vitamins-Minerals (VISION HEALTH PO) Take by mouth.     Nutritional Supplements (GLUCOSE MANAGEMENT PO) Take by mouth.     Omega-3 Fatty Acids (OMEGA 3 500 PO) Take 1 capsule by mouth in the morning and at bedtime.     polyethylene glycol (MIRALAX /  GLYCOLAX) packet Take 17 g by mouth 2 (two) times a week.     Polyvinyl Alcohol-Povidone (REFRESH OP) Place 1 drop into both eyes daily.      Specialty Vitamins Products (CARDIOVASCULAR SUPPORT PO) Take 2 capsules by mouth daily.     valsartan (DIOVAN) 320 MG tablet TAKE 1 TABLET BY MOUTH  EVERY DAY 90 tablet 3   No current facility-administered medications on file prior to visit.     Review of Systems  Constitutional:  Positive for fatigue. Negative for activity change, appetite change and fever.  HENT:  Negative for congestion and sore throat.   Eyes:  Negative for itching and visual disturbance.  Respiratory:  Negative for cough and shortness of breath.   Cardiovascular:  Negative for leg swelling.  Gastrointestinal:  Negative for abdominal distention, abdominal pain, constipation, diarrhea and nausea.  Endocrine: Negative for cold intolerance and polydipsia.  Genitourinary:  Positive for frequency and urgency. Negative for difficulty urinating, flank pain and hematuria.       Vaginal discomfort from large bladder prolapse (no itching or discharge)  Musculoskeletal:  Negative for myalgias.  Skin:  Negative for rash.  Allergic/Immunologic: Negative for immunocompromised state.  Neurological:  Negative for dizziness and weakness.  Hematological:  Negative for adenopathy.       Objective:   Physical Exam Constitutional:      General: She is not in acute distress.    Appearance: Normal appearance. She is well-developed and normal weight. She is not ill-appearing or diaphoretic.  HENT:     Head: Normocephalic and atraumatic.  Eyes:     General: No scleral icterus.    Conjunctiva/sclera: Conjunctivae normal.     Pupils: Pupils are equal, round, and reactive to light.  Cardiovascular:     Rate and Rhythm: Normal rate and regular rhythm.     Heart sounds: Normal heart sounds.  Pulmonary:     Effort: Pulmonary effort is normal.     Breath sounds: Normal breath sounds.  Abdominal:     General: Bowel sounds are normal. There is no distension.     Palpations: Abdomen is soft.     Tenderness: There is abdominal tenderness. There is no right CVA tenderness, left CVA tenderness or rebound.     Comments: No cva tenderness  Mild suprapubic tenderness Bladder does not  feel over distended   Musculoskeletal:     Cervical back: Normal range of motion and neck supple.     Comments: Wearing her back brace  Lymphadenopathy:     Cervical: No cervical adenopathy.  Skin:    Findings: No rash.  Neurological:     Mental Status: She is alert.     Motor: No weakness.  Psychiatric:        Mood and Affect: Mood normal.           Assessment & Plan:   Problem List Items Addressed This Visit       Genitourinary   Acute cystitis - Primary    In pt with female prolapse - planning surgery (reviewed her uro gyn notes) and does not empty completely   Uc prelim is gm neg bac, likely e coli Sent in keflex to start now , pending final report Inst to call if symptoms worsen in the meantime Disc imp of fluid intake  She may try cramberry otc suppl for prevention as well  Enc her to get on Dr West Pugh schedule when she returns   Update if not starting to improve  in a week or if worsening   Handout given       Prolapse of female pelvic organs    Ongoing- planning surgery  Sees Dr Zigmund Daniel and also has appt with Dr Ruby Cola in march  Will decide on plan based on opinions  Reviewed last notes from her today  Reviewed report from renal US Rev recent labs   In meantime has some voiding symptoms and trouble emptying Pos uc pend final, likely e coli and will tx with keflex She will f/u with uro gyn  She will work on fluid intake  Cranberry supplement is ok also

## 2022-02-11 NOTE — Patient Instructions (Signed)
I think you have a uti making your bladder feel even worse than usual  We are pending the final urine culture -should be soon  Start the keflex as directed Try and get at least 60 oz of fluid daily /mostly water   We will call when the culture returns   In the meantime if you get worse let us know   Touch base with Dr Ashley Royalty and get on the schedule when you can

## 2022-02-11 NOTE — Assessment & Plan Note (Addendum)
Ongoing- planning surgery  Sees Dr Ashley Royalty and also has appt with Dr Oleh Genin in march  Will decide on plan based on opinions  Reviewed last notes from her today  Reviewed report from renal US Rev recent labs   In meantime has some voiding symptoms and trouble emptying Pos uc pend final, likely e coli and will tx with keflex She will f/u with uro gyn  She will work on fluid intake  Cranberry supplement is ok also

## 2022-02-11 NOTE — Assessment & Plan Note (Signed)
In pt with female prolapse - planning surgery (reviewed her uro gyn notes) and does not empty completely   Uc prelim is gm neg bac, likely e coli Sent in keflex to start now , pending final report Inst to call if symptoms worsen in the meantime Disc imp of fluid intake  She may try cramberry otc suppl for prevention as well  Enc her to get on Dr Anastasio Auerbach schedule when she returns   Update if not starting to improve in a week or if worsening   Handout given

## 2022-02-12 LAB — URINE CULTURE
MICRO NUMBER:: 14334681
SPECIMEN QUALITY:: ADEQUATE

## 2022-02-23 DIAGNOSIS — H353131 Nonexudative age-related macular degeneration, bilateral, early dry stage: Secondary | ICD-10-CM | POA: Diagnosis not present

## 2022-02-23 DIAGNOSIS — H43813 Vitreous degeneration, bilateral: Secondary | ICD-10-CM | POA: Diagnosis not present

## 2022-02-23 DIAGNOSIS — Z961 Presence of intraocular lens: Secondary | ICD-10-CM | POA: Diagnosis not present

## 2022-02-23 DIAGNOSIS — H35033 Hypertensive retinopathy, bilateral: Secondary | ICD-10-CM | POA: Diagnosis not present

## 2022-02-26 ENCOUNTER — Encounter: Payer: Self-pay | Admitting: Obstetrics and Gynecology

## 2022-02-26 ENCOUNTER — Ambulatory Visit (INDEPENDENT_AMBULATORY_CARE_PROVIDER_SITE_OTHER): Payer: Medicare PPO | Admitting: Obstetrics and Gynecology

## 2022-02-26 VITALS — BP 163/84 | HR 96 | Ht 65.3 in | Wt 186.0 lb

## 2022-02-26 DIAGNOSIS — N3941 Urge incontinence: Secondary | ICD-10-CM | POA: Diagnosis not present

## 2022-02-26 DIAGNOSIS — N812 Incomplete uterovaginal prolapse: Secondary | ICD-10-CM | POA: Diagnosis not present

## 2022-02-26 DIAGNOSIS — R339 Retention of urine, unspecified: Secondary | ICD-10-CM | POA: Diagnosis not present

## 2022-02-26 DIAGNOSIS — N393 Stress incontinence (female) (male): Secondary | ICD-10-CM | POA: Diagnosis not present

## 2022-02-26 DIAGNOSIS — N816 Rectocele: Secondary | ICD-10-CM

## 2022-02-26 DIAGNOSIS — N950 Postmenopausal bleeding: Secondary | ICD-10-CM | POA: Diagnosis not present

## 2022-02-26 DIAGNOSIS — R350 Frequency of micturition: Secondary | ICD-10-CM

## 2022-02-26 DIAGNOSIS — N8110 Cystocele, unspecified: Secondary | ICD-10-CM

## 2022-02-26 LAB — POCT URINALYSIS DIPSTICK
Bilirubin, UA: NEGATIVE
Blood, UA: NEGATIVE
Glucose, UA: NEGATIVE
Ketones, UA: NEGATIVE
Leukocytes, UA: NEGATIVE
Nitrite, UA: NEGATIVE
Protein, UA: NEGATIVE
Spec Grav, UA: >=1.03 — AB (ref 1.010–1.025)
Urobilinogen, UA: 0.2 E.U./dL
pH, UA: 7.5 (ref 5.0–8.0)

## 2022-02-26 NOTE — Patient Instructions (Signed)
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  We discussed the fact that it is not life threatening but there are several treatment options. For treatment of pelvic organ prolapse, we discussed options for management including expectant management, conservative management, and surgical management, such as Kegels, a pessary, pelvic floor physical therapy, and specific surgical procedures.    

## 2022-02-26 NOTE — Progress Notes (Signed)
[facility] New Patient Evaluation and Consultation  Referring Provider: Abner Greenspan, MD PCP: Abner Greenspan, MD Date of Service: 02/26/2022  SUBJECTIVE Chief Complaint: New Patient (Initial Visit)  History of Present Illness: Jordan Patterson is a 82 y.o. White or Caucasian female seen in consultation at the request of Dr. Glori Bickers for evaluation of prolapse.    Review of records significant for: Previously seen at [facility] (Dr Zigmund Daniel). Noted to have urinary retention due to prolapse. Had renal US that showed no hydronephrosis.   Urinary Symptoms: Leaks urine with cough/ sneeze, lifting, with movement to the bathroom, with urgency, and without sensation. Has large amounts of leakage.  Leaks a few time(s) per day.  Pad use:  pads  She is bothered by her UI symptoms. Mostly has trouble emptying bladder for about the last 18 months (or more).  Has not had urodynamic testing.   Day time voids 3.  Nocturia: 1-4 times per night to void. Voiding dysfunction: she does not empty her bladder well.  does not use a catheter to empty bladder.  When urinating, she feels a weak stream and difficulty starting urine stream  UTIs: 1 UTI's in the last year- 02/09/22 Klebsiella pneumoniae Denies history of blood in urine and kidney or bladder stones  Pelvic Organ Prolapse Symptoms:                  She Admits to a feeling of a bulge the vaginal area. It has been present for several years.  She Admits to seeing a bulge.  This bulge is bothersome. She has seen Dr Zigmund Daniel at [facility]. She was planning a surgery but cancelled due to illness.  Her gynecologist tried a pessary back in 2018 and it would not stay in place.   Bowel Symptom: Bowel movements: 1-2 time(s) per day Stool consistency: soft  Straining: no.  Splinting: no.  Incomplete evacuation: no.  She Denies accidental bowel leakage / fecal incontinence Bowel regimen: miralax daily   Sexual  Function Sexually active: no.   Pelvic Pain Denies pelvic pain  Past Medical History:  Past Medical History:  Diagnosis Date   Allergic rhinitis    Arthritis    hands and knees - ?osteo   Asthma    Chronic headaches    Compression fracture of T12 vertebra ([place]) 07/18/2014   S/p kyphoplasty by Dr Sherwood Gambler (08/2014)    Depression    pt denies   Diabetes type 2, controlled ([place]) 2004   Environmental allergies    dust,mold,mildew   Extrinsic asthma    GERD (gastroesophageal reflux disease)    Olevia Perches) EGD - mild esophageal dysmotility and small hiatal hernia   Hiatal hernia    Hx of migraines    Hyperlipidemia    mild, diet controlled   Hypertension    Hypothyroidism    Macular degeneration    Osteoporosis with fracture 07/2014   T -1.2 hip, T 0.2 spine, T12 compression fracture s/p kyphoplasty     Past Surgical History:   Past Surgical History:  Procedure Laterality Date   41 HOUR [place] STUDY N/A 04/11/2017   Procedure: 24 HOUR PH STUDY;  Surgeon: Mauri Pole, MD;  Location: [facility];  Service: Endoscopy;  Laterality: N/A;   BACK SURGERY     CATARACT EXTRACTION  02/22/2001   bilateral with lens implant   COLONOSCOPY     DEXA  05/23/2008   improved (initial DEXA 2007 - mild osteopenia)  DILATION AND CURETTAGE OF UTERUS  02/23/1976   ESOPHAGEAL MANOMETRY N/A 04/11/2017   Procedure: ESOPHAGEAL MANOMETRY (EM);  Surgeon: Mauri Pole, MD;  Location: [facility];  Service: Endoscopy;  Laterality: N/A;   ESOPHAGOGASTRODUODENOSCOPY  06/25/2011   Procedure: ESOPHAGOGASTRODUODENOSCOPY (EGD);  Surgeon: Lafayette Dragon, MD;  Location: [facility];  Service: Endoscopy;  Laterality: N/A;  no Xray   EYE SURGERY     KYPHOPLASTY N/A 08/30/2014   Procedure: THORACIC TWELVE KYPHOPLASTY;  Surgeon: Jovita Gamma, MD   KYPHOPLASTY N/A 01/22/2021   Procedure: Thoracic eight, Thoracic nine KYPHOPLASTY;  Surgeon: Ashok Pall, MD;  Location: [facility];  Service: Neurosurgery;   Laterality: N/A;   LAPAROSCOPY ABDOMEN DIAGNOSTIC     To R/O endometriosis   PH IMPEDANCE STUDY N/A 04/11/2017   Procedure: [place] IMPEDANCE STUDY;  Surgeon: Mauri Pole, MD;  Location: [facility];  Service: Endoscopy;  Laterality: N/A;   SAVORY DILATION  06/25/2011   Procedure: SAVORY DILATION;  Surgeon: Lafayette Dragon, MD;  Location: [facility];  Service: Endoscopy;  Laterality: N/A;   TONSILLECTOMY AND ADENOIDECTOMY  02/23/1960   US ECHOCARDIOGRAPHY  85/88/5027   nl systolic function EF 74%, mild diastolic dysfunction     Past OB/GYN History: OB History  Gravida Para Term Preterm AB Living  3 3 3     3   SAB IAB Ectopic Multiple Live Births          3    # Outcome Date GA Lbr Len/2nd Weight Sex Delivery Anes PTL Lv  3 Term      Vag-Spont     2 Term      Vag-Spont     1 Term      Vag-Spont      Menopausal: Admits to vaginal bleeding since menopause- noted an ulceration on the vagina that bleeds intermittently. Last noticed bleeding today.   Any history of abnormal pap smears: no.   Medications: She has a current medication list which includes the following prescription(s): accu-chek softclix lancets, acetaminophen, albuterol, amlodipine, aspirin, blood glucose meter kit and supplies, breztri aerosphere, calcium, chlorpheniramine, chlorthalidone, vitamin d3, esomeprazole, famotidine, ferrous sulfate, garlic, glucose blood, levothyroxine, magnesium, metformin, montelukast, multiple vitamins-minerals, nutritional supplements, omega-3 fatty acids, polyethylene glycol, polyvinyl alcohol-povidone, specialty vitamins products, and valsartan.   Allergies: Patient is allergic to ace inhibitors, crestor [rosuvastatin], wellbutrin [bupropion hcl], cymbalta [duloxetine hcl], lipitor [atorvastatin], pregabalin, and tegretol [carbamazepine].   Social History:  Social History   Tobacco Use   Smoking status: Never    Passive exposure: Never   Smokeless tobacco: Never  Vaping Use    Vaping Use: Never used  Substance Use Topics   Alcohol use: No    Alcohol/week: 0.0 standard drinks of alcohol   Drug use: No    Family History:   Family History  Problem Relation Age of Onset   Mitral valve prolapse Father    Diabetes Father    Mitral valve prolapse Mother    Hypertension Mother    Stroke Mother 16       after valve surgery   Parkinson's disease Brother    Other Brother        POST WAR TRAUMA   Stroke Paternal Grandfather    Heart failure Maternal Grandfather    Cancer Neg Hx    Colon cancer Neg Hx    Stomach cancer Neg Hx    Esophageal cancer Neg Hx    Pancreatic cancer Neg Hx    Liver disease Neg Hx  Review of Systems: Review of Systems  Constitutional:  Negative for fever, malaise/fatigue and weight loss.  Respiratory:  Positive for cough. Negative for shortness of breath and wheezing.   Cardiovascular:  Negative for chest pain, palpitations and leg swelling.  Gastrointestinal:  Negative for abdominal pain and blood in stool.  Genitourinary:  Negative for dysuria.  Musculoskeletal:  Negative for myalgias.  Skin:  Negative for rash.  Neurological:  Negative for dizziness and headaches.  Endo/Heme/Allergies:  Does not bruise/bleed easily.  Psychiatric/Behavioral:  Negative for depression. The patient is not nervous/anxious.      OBJECTIVE Physical Exam: Vitals:   02/26/22 1525 02/26/22 1533  BP: (!) 156/78 (!) 163/84  Pulse: 94 96  Weight: 186 lb (84.4 kg)   Height: 5' 5.3" (1.659 m)     Physical Exam Constitutional:      General: She is not in acute distress. Pulmonary:     Effort: Pulmonary effort is normal.  Abdominal:     General: There is no distension.     Palpations: Abdomen is soft.     Tenderness: There is no abdominal tenderness. There is no rebound.  Musculoskeletal:        General: No swelling. Normal range of motion.  Skin:    General: Skin is warm and dry.     Findings: No rash.  Neurological:     Mental Status:  She is alert and oriented to person, place, and time.  Psychiatric:        Mood and Affect: Mood normal.        Behavior: Behavior normal.      GU / Detailed Urogynecologic Evaluation:  Pelvic Exam: Normal external female genitalia; Bartholin's and Skene's glands normal in appearance; urethral meatus normal in appearance, no urethral masses or discharge.   CST: positive with prolapse reduced  Speculum exam reveals normal vaginal mucosa with atrophy. Cervix normal appearance. Uterus normal single, nontender. Adnexa no mass, fullness, tenderness.     Pelvic floor strength I/V Pelvic floor musculature: Right levator non-tender, Right obturator non-tender, Left levator non-tender, Left obturator non-tender  POP-Q:   POP-Q  3                                            Aa   5                                           Ba  4                                              C   7                                            Gh  4.5                                            Pb  7  tvl   2                                            Ap  2                                            Bp  -3                                              D      Rectal Exam:  Normal external rectum  Post-Void Residual (PVR) by Bladder Scan: In order to evaluate bladder emptying, we discussed obtaining a postvoid residual and she agreed to this procedure.  Procedure: The ultrasound unit was placed on the patient's abdomen in the suprapubic region after the patient had voided. A PVR of 365 ml was obtained by bladder scan. Straight catheter placed and drained.   Laboratory Results: POC urine: negative   ASSESSMENT AND PLAN Ms. Liska is a 82 y.o. with:  1. Prolapse of anterior vaginal wall   2. Uterovaginal prolapse, incomplete   3. Prolapse of posterior vaginal wall   4. Incomplete bladder emptying   5. Postmenopausal bleeding   6. Urinary  frequency   7. SUI (stress urinary incontinence, female)   8. Urge incontinence    Stage IV anterior, Stage III posterior, Stage III apical prolapse - For treatment of pelvic organ prolapse, we discussed options for management including expectant management, conservative management, and surgical management, such as Kegels, a pessary, pelvic floor physical therapy, and specific surgical procedures. - She would like to proceed with surgery. Discussed: Colpocleisis, levator plication, perineorrhaphy and cystoscopy. Handout provided about Colpocleisis. She understands she will not be able to be sexually active and this is not an issue for her.  -  We discussed risks of bleeding, infection, damage to surrounding organs including bowel, bladder, blood vessels, ureters and nerves, need for further surgery, numbness and weakness at any body site, buttock pain, postoperative cognitive dysfunction, and the rarer risks of blood clot, heart attack, pneumonia, death. Also reviewed possible need for a catheter post-operatively.  - She will need cardiac and pulmonary clearance for surgery (this was obtained recently through [facility] as she was planning surgery with [facility]).   2. Incomplete bladder emptying - Elevated PVR today but patient did not want to void. However, this is documented in prior notes. Likely due to bladder prolapse. Will defer urodynamic testing at this time, although we discussed that we may need it post operatively if she has incontinence or emptying symptoms  - Recent renal US (care everywhere) does not show hydronephrosis.  - Renal function testing wnl  3. Postmenopausal bleeding - Will need to have pelvic US to assess endometrium. No vaginal lesions noted today to explain vaginal bleeding.   4. Incontinence - likely exacerbated by incomplete bladder emptying.  - We discussed that the surgery will not address her incontinence as doing an incontinence procedure could potentially worsen  urinary retention. Can plan for an interval procedure if needed.   Return for pre op  Marguerita Beards, MD

## 2022-03-01 DIAGNOSIS — M9902 Segmental and somatic dysfunction of thoracic region: Secondary | ICD-10-CM | POA: Diagnosis not present

## 2022-03-01 DIAGNOSIS — S233XXA Sprain of ligaments of thoracic spine, initial encounter: Secondary | ICD-10-CM | POA: Diagnosis not present

## 2022-03-04 ENCOUNTER — Telehealth: Payer: Self-pay | Admitting: *Deleted

## 2022-03-04 ENCOUNTER — Encounter: Payer: Self-pay | Admitting: Obstetrics and Gynecology

## 2022-03-04 NOTE — Telephone Encounter (Signed)
   Pre-operative Risk Assessment    Patient Name: Jordan Patterson  DOB: [birth year] MRN: [id number]      Request for Surgical Clearance    Procedure:   LeFORTE COLPOCLEISIS ; VAGINAL/ROBOTIC PROCEDURE   Date of Surgery:  Clearance TBD                                 Surgeon:  DR. Sherlene Shams Surgeon's Group or Practice Name:  [facility] Phone number:  [phone] Fax number:  [phone]   Type of Clearance Requested:   - Medical ; ASA    Type of Anesthesia:  General    Additional requests/questions:    [name]   03/04/2022, 4:22 PM

## 2022-03-04 NOTE — Telephone Encounter (Signed)
Primary Cardiologist:Henry Carlye Grippe, MD   Preoperative team, please contact this patient and set up a phone call appointment for further preoperative risk assessment. Please obtain consent and complete medication review. Thank you for your help. Patient previously cleared for surgery on 11/10/21. Due to time > 2 months, we will need to have a virtual visit to update the clearance.   Aspirin can be held for 7 days prior to her surgery.  Please resume Aspirin post operatively when it is felt to be safe from a bleeding standpoint.    Emmaline Life, NP-C  03/04/2022, 4:50 PM [address] Office [phone] Fax [phone]

## 2022-03-04 NOTE — Telephone Encounter (Signed)
Pt agreeable to tele pre op appt 03/29/22 @ 9 am. Med rec and cosent are done.     Patient Consent for Virtual Visit        Jordan Patterson has provided verbal consent on 03/04/2022 for a virtual visit (video or telephone).   CONSENT FOR VIRTUAL VISIT FOR:  Jordan Patterson  By participating in this virtual visit I agree to the following:  I hereby voluntarily request, consent and authorize [facility] and its employed or contracted physicians, physician assistants, nurse practitioners or other licensed health care professionals (the Practitioner), to provide me with telemedicine health care services (the "Services") as deemed necessary by the treating Practitioner. I acknowledge and consent to receive the Services by the Practitioner via telemedicine. I understand that the telemedicine visit will involve communicating with the Practitioner through live audiovisual communication technology and the disclosure of certain medical information by electronic transmission. I acknowledge that I have been given the opportunity to request an in-person assessment or other available alternative prior to the telemedicine visit and am voluntarily participating in the telemedicine visit.  I understand that I have the right to withhold or withdraw my consent to the use of telemedicine in the course of my care at any time, without affecting my right to future care or treatment, and that the Practitioner or I may terminate the telemedicine visit at any time. I understand that I have the right to inspect all information obtained and/or recorded in the course of the telemedicine visit and may receive copies of available information for a reasonable fee.  I understand that some of the potential risks of receiving the Services via telemedicine include:  Delay or interruption in medical evaluation due to technological equipment failure or disruption; Information transmitted may not be sufficient (e.g. poor resolution  of images) to allow for appropriate medical decision making by the Practitioner; and/or  In rare instances, security protocols could fail, causing a breach of personal health information.  Furthermore, I acknowledge that it is my responsibility to provide information about my medical history, conditions and care that is complete and accurate to the best of my ability. I acknowledge that Practitioner's advice, recommendations, and/or decision may be based on factors not within their control, such as incomplete or inaccurate data provided by me or distortions of diagnostic images or specimens that may result from electronic transmissions. I understand that the practice of medicine is not an exact science and that Practitioner makes no warranties or guarantees regarding treatment outcomes. I acknowledge that a copy of this consent can be made available to me via my patient portal ([facility]), or I can request a printed copy by calling the office of [facility].    I understand that my insurance will be billed for this visit.   I have read or had this consent read to me. I understand the contents of this consent, which adequately explains the benefits and risks of the Services being provided via telemedicine.  I have been provided ample opportunity to ask questions regarding this consent and the Services and have had my questions answered to my satisfaction. I give my informed consent for the services to be provided through the use of telemedicine in my medical care

## 2022-03-04 NOTE — Telephone Encounter (Signed)
Pt agreeable to tele pre op appt 03/29/22 @ 9 am. Med rec and cosent are done.

## 2022-03-05 ENCOUNTER — Ambulatory Visit (HOSPITAL_COMMUNITY)
Admission: RE | Admit: 2022-03-05 | Discharge: 2022-03-05 | Disposition: A | Discharge status: Home / Self Care | Payer: Medicare PPO | Source: Ambulatory Visit | Attending: Obstetrics and Gynecology | Admitting: Obstetrics and Gynecology

## 2022-03-05 DIAGNOSIS — N950 Postmenopausal bleeding: Secondary | ICD-10-CM | POA: Diagnosis not present

## 2022-03-15 ENCOUNTER — Ambulatory Visit (INDEPENDENT_AMBULATORY_CARE_PROVIDER_SITE_OTHER): Payer: Medicare PPO

## 2022-03-15 VITALS — Ht 65.3 in | Wt 186.0 lb

## 2022-03-15 DIAGNOSIS — Z0000 Encounter for general adult medical examination without abnormal findings: Secondary | ICD-10-CM

## 2022-03-15 NOTE — Progress Notes (Signed)
Subjective:   Jordan Patterson is a 82 y.o. female who presents for Medicare Annual (Subsequent) preventive examination.  Review of Systems    No ROS.  Medicare Wellness Virtual Visit.  Visual/audio telehealth visit, UTA vital signs.   See social history for additional risk factors.   Cardiac Risk Factors include: advanced age (>52men, >23 women);diabetes mellitus;hypertension     Objective:    Today's Vitals   03/15/22 1122  Weight: 186 lb (84.4 kg)  Height: 5' 5.3" (1.659 m)   Body mass index is 30.67 kg/m.     03/15/2022   11:21 AM 03/13/2021   11:20 AM 01/22/2021    2:16 PM 01/20/2021    1:14 PM 03/12/2020   11:07 AM 03/05/2019    3:34 PM 02/27/2018    8:58 AM  Advanced Directives  Does Patient Have a Medical Advance Directive? Yes Yes Yes Yes Yes Yes Yes  Type of Estate agent of [place];Living will Healthcare Power of [place];Living will Healthcare Power of [employer] Power of [employer] Power of [place];Living will Healthcare Power of [place];Living will Healthcare Power of [place];Living will  Does patient want to make changes to medical advance directive? No - Patient declined Yes (MAU/Ambulatory/Procedural Areas - Information given) No - Patient declined      Copy of Healthcare Power of Attorney in Chart? Yes - validated most recent copy scanned in chart (See row information)  No - copy requested  No - copy requested Yes - validated most recent copy scanned in chart (See row information) Yes - validated most recent copy scanned in chart (See row information)    Current Medications (verified) Outpatient Encounter Medications as of 03/15/2022  Medication Sig   Accu-Chek Softclix Lancets lancets Use as instructed   acetaminophen (TYLENOL) 325 MG tablet Take 650 mg by mouth every 6 (six) hours as needed.   albuterol (VENTOLIN HFA) 108 (90 Base) MCG/ACT inhaler TAKE 2 PUFFS BY MOUTH EVERY 6 HOURS AS NEEDED FOR WHEEZE OR SHORTNESS OF  BREATH   amLODipine (NORVASC) 5 MG tablet TAKE 1 TABLET (5 MG TOTAL) BY MOUTH DAILY.   aspirin 81 MG tablet Take 81 mg by mouth daily.   blood glucose meter kit and supplies Dispense based on patient and insurance preference. Use qd and prn for uncontrolled diabetes. (FOR ICD-10 E10.9, E11.9).   Budeson-Glycopyrrol-Formoterol (BREZTRI AEROSPHERE) 160-9-4.8 MCG/ACT AERO TAKE 2 PUFFS BY MOUTH TWICE A DAY   Calcium 250 MG CAPS Take 500 mg by mouth in the morning and at bedtime.   chlorpheniramine (CHLOR-TRIMETON) 4 MG tablet Take 4 mg by mouth in the morning, at noon, and at bedtime.   chlorthalidone (HYGROTON) 25 MG tablet TAKE 1 TABLET (25 MG TOTAL) BY MOUTH DAILY.   Cholecalciferol (VITAMIN D3) 1000 units CAPS Take 1 capsule (1,000 Units total) by mouth daily.   esomeprazole (NEXIUM) 40 MG capsule 1 capsule by mouth every day before breakfast   famotidine (PEPCID) 20 MG tablet TAKE 1 TABLET BY MOUTH EVERYDAY AT BEDTIME   ferrous sulfate 325 (65 FE) MG EC tablet Take 325 mg by mouth every Monday, Wednesday, and Friday.   Garlic (GARLIQUE PO) Take 1 tablet by mouth daily.   glucose blood test strip Use as instructed   levothyroxine (SYNTHROID) 100 MCG tablet TAKE 1 TABLET BY MOUTH EVERY DAY BEFORE BREAKFAST   MAGNESIUM PO Take 2 tablets by mouth daily.   metFORMIN (GLUCOPHAGE) 1000 MG tablet Take 1 tablet (1,000 mg total) by mouth 2 (  two) times daily with a meal.   montelukast (SINGULAIR) 10 MG tablet TAKE 1 TABLET BY MOUTH EVERYDAY AT BEDTIME   Multiple Vitamins-Minerals (VISION HEALTH PO) Take by mouth.   Nutritional Supplements (GLUCOSE MANAGEMENT PO) Take by mouth.   Omega-3 Fatty Acids (OMEGA 3 500 PO) Take 1 capsule by mouth in the morning and at bedtime.   polyethylene glycol (MIRALAX / GLYCOLAX) packet Take 17 g by mouth 2 (two) times a week.   Polyvinyl Alcohol-Povidone (REFRESH OP) Place 1 drop into both eyes daily.    Specialty Vitamins Products (CARDIOVASCULAR SUPPORT PO) Take 2  capsules by mouth daily.   valsartan (DIOVAN) 320 MG tablet TAKE 1 TABLET BY MOUTH EVERY DAY   No facility-administered encounter medications on file as of 03/15/2022.    Allergies (verified) Ace inhibitors, Crestor [rosuvastatin], Wellbutrin [bupropion hcl], Cymbalta [duloxetine hcl], Lipitor [atorvastatin], Pregabalin, and Tegretol [carbamazepine]   History: Past Medical History:  Diagnosis Date   Allergic rhinitis    Arthritis    hands and knees - ?osteo   Asthma    Chronic headaches    Compression fracture of T12 vertebra ([place]) 07/18/2014   S/p kyphoplasty by Dr Sherwood Gambler (08/2014)    Depression    pt denies   Diabetes type 2, controlled ([place]) 2004   Environmental allergies    dust,mold,mildew   Extrinsic asthma    GERD (gastroesophageal reflux disease)    Olevia Perches) EGD - mild esophageal dysmotility and small hiatal hernia   Hiatal hernia    Hx of migraines    Hyperlipidemia    mild, diet controlled   Hypertension    Hypothyroidism    Macular degeneration    Osteoporosis with fracture 07/2014   T -1.2 hip, T 0.2 spine, T12 compression fracture s/p kyphoplasty   Past Surgical History:  Procedure Laterality Date   84 HOUR [place] STUDY N/A 04/11/2017   Procedure: 24 HOUR PH STUDY;  Surgeon: Mauri Pole, MD;  Location: [facility];  Service: Endoscopy;  Laterality: N/A;   BACK SURGERY     CATARACT EXTRACTION  02/22/2001   bilateral with lens implant   COLONOSCOPY     DEXA  05/23/2008   improved (initial DEXA 2007 - mild osteopenia)   DILATION AND CURETTAGE OF UTERUS  02/23/1976   ESOPHAGEAL MANOMETRY N/A 04/11/2017   Procedure: ESOPHAGEAL MANOMETRY (EM);  Surgeon: Mauri Pole, MD;  Location: [facility];  Service: Endoscopy;  Laterality: N/A;   ESOPHAGOGASTRODUODENOSCOPY  06/25/2011   Procedure: ESOPHAGOGASTRODUODENOSCOPY (EGD);  Surgeon: Lafayette Dragon, MD;  Location: [facility];  Service: Endoscopy;  Laterality: N/A;  no Xray   EYE SURGERY      KYPHOPLASTY N/A 08/30/2014   Procedure: THORACIC TWELVE KYPHOPLASTY;  Surgeon: Jovita Gamma, MD   KYPHOPLASTY N/A 01/22/2021   Procedure: Thoracic eight, Thoracic nine KYPHOPLASTY;  Surgeon: Ashok Pall, MD;  Location: [facility];  Service: Neurosurgery;  Laterality: N/A;   LAPAROSCOPY ABDOMEN DIAGNOSTIC     To R/O endometriosis   PH IMPEDANCE STUDY N/A 04/11/2017   Procedure: [place] IMPEDANCE STUDY;  Surgeon: Mauri Pole, MD;  Location: [facility];  Service: Endoscopy;  Laterality: N/A;   SAVORY DILATION  06/25/2011   Procedure: SAVORY DILATION;  Surgeon: Lafayette Dragon, MD;  Location: [facility];  Service: Endoscopy;  Laterality: N/A;   TONSILLECTOMY AND ADENOIDECTOMY  02/23/1960   US ECHOCARDIOGRAPHY  22/29/7989   nl systolic function EF 21%, mild diastolic dysfunction   Family History  Problem Relation Age of  Onset   Mitral valve prolapse Father    Diabetes Father    Mitral valve prolapse Mother    Hypertension Mother    Stroke Mother 25       after valve surgery   Parkinson's disease Brother    Other Brother        POST WAR TRAUMA   Stroke Paternal Grandfather    Heart failure Maternal Grandfather    Cancer Neg Hx    Colon cancer Neg Hx    Stomach cancer Neg Hx    Esophageal cancer Neg Hx    Pancreatic cancer Neg Hx    Liver disease Neg Hx    Social History   Socioeconomic History   Marital status: Widowed    Spouse name: Not on file   Number of children: 3   Years of education: Not on file   Highest education level: Not on file  Occupational History   Occupation: Retired  Tobacco Use   Smoking status: Never    Passive exposure: Never   Smokeless tobacco: Never  Vaping Use   Vaping Use: Never used  Substance and Sexual Activity   Alcohol use: No    Alcohol/week: 0.0 standard drinks of alcohol   Drug use: No   Sexual activity: Not Currently  Other Topics Concern   Not on file  Social History Narrative   Caffeine: occasional coffee   Lives alone,  widower, 1 cat   Occupation: retired, 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, gardens, walks   Diet: good water, fruits/vegetables daily      HCPOA is [place] [phone] (cell)   Social Determinants of Health   Financial Resource Strain: Low Risk  (03/15/2022)   Overall Financial Resource Strain (CARDIA)    Difficulty of Paying Living Expenses: Not hard at all  Food Insecurity: No Food Insecurity (03/15/2022)   Hunger Vital Sign    Worried About Running Out of Food in the Last Year: Never true    [place] in the Last Year: Never true  Transportation Needs: No Transportation Needs (03/15/2022)   PRAPARE - Hydrologist (Medical): No    Lack of Transportation (Non-Medical): No  Physical Activity: Insufficiently Active (03/15/2022)   Exercise Vital Sign    Days of Exercise per Week: 5 days    Minutes of Exercise per Session: 20 min  Stress: No Stress Concern Present (03/15/2022)   [place]    Feeling of Stress : Not at all  Social Connections: Socially Isolated (03/15/2022)   Social Connection and Isolation Panel [NHANES]    Frequency of Communication with Friends and Family: More than three times a week    Frequency of Social Gatherings with Friends and Family: Once a week    Attends Religious Services: Never    Marine scientist or Organizations: No    Attends Archivist Meetings: Never    Marital Status: Widowed    Tobacco Counseling Counseling given: Not Answered   Clinical Intake:  Pre-visit preparation completed: Yes        Diabetes: Yes (Followed by pcp)  How often do you need to have someone help you when you read instructions, pamphlets, or other written materials from your doctor or pharmacy?: 1 - Never  Nutrition Risk Assessment: Does the patient have any non-healing wounds?  No  Has the patient had any unintentional weight loss or weight gain?  No   Diabetes: Is the patient diabetic?  Yes  If diabetic, was a CBG obtained today?  Yes , FBS 120 Did the patient bring in their glucometer from home?  No  How often do you monitor your CBG's? daily.   Financial Strains and Diabetes Management: Are you having any financial strains with the device, your supplies or your medication? No .  Does the patient want to be seen by Chronic Care Management for management of their diabetes?  No  Would the patient like to be referred to a Nutritionist or for Diabetic Management?  No   Interpreter Needed?: No      Activities of Daily Living    03/15/2022   11:33 AM  In your present state of health, do you have any difficulty performing the following activities:  Hearing? 1  Comment Hearing aids  Vision? 0  Difficulty concentrating or making decisions? 0  Walking or climbing stairs? 1  Comment Paces self  Dressing or bathing? 0  Doing errands, shopping? 0  Preparing Food and eating ? N  Using the Toilet? N  In the past six months, have you accidently leaked urine? Y  Comment Managed with daily liner/pad  Do you have problems with loss of bowel control? N  Managing your Medications? N  Managing your Finances? N  Housekeeping or managing your Housekeeping? N    Patient Care Team: Tower, Wynelle Fanny, MD as PCP - General (Family Medicine) Belva Crome, MD as PCP - Cardiology (Cardiology) Feliz Beam, MD as Referring Physician (Ophthalmology) Deneise Lever, MD as Consulting Physician (Pulmonary Disease) Meylor, Marlou Sa, DC as Consulting Physician (Chiropractic Medicine) Daneen Schick, DDS as Consulting Physician (Dentistry)  Indicate any recent Medical Services you may have received from other than [facility] providers in the past year (date may be approximate).     Assessment:   This is a routine wellness examination for [place].  I connected with  Duaine Dredge on  03/15/22 by a audio enabled telemedicine application and verified that I am speaking with the correct person using two identifiers.  Patient Location: Home  Provider Location: Office/Clinic  I discussed the limitations of evaluation and management by telemedicine. The patient expressed understanding and agreed to proceed.   Hearing/Vision screen Hearing Screening - Comments:: Hearing aids Vision Screening - Comments:: Last exam 02/2022, Dr. Isaias Sakai, wears glasses Macular degeneration No retinopathy reported  Dietary issues and exercise activities discussed: Current Exercise Habits: Home exercise routine, Intensity: Mild Healthy diet Fair water intake   Goals Addressed               This Visit's Progress     Patient Stated     Maintain healthy lifestyle (pt-stated)        Stay active Stay hydrated Healthy diet       Depression Screen    03/15/2022   11:25 AM 08/13/2021    8:07 AM 03/13/2021   11:23 AM 03/12/2020   11:09 AM 09/24/2019    8:14 AM 03/12/2019   10:17 AM 03/05/2019    3:36 PM  PHQ 2/9 Scores  PHQ - 2 Score 0 0 0 0 0 0 0  PHQ- 9 Score    0  1 0    Fall Risk    03/15/2022   11:27 AM 08/13/2021    8:07 AM 03/13/2021   11:21 AM 03/12/2020   11:09 AM 03/05/2019  3:35 PM  Fall Risk   Falls in the past year? 0 0 1 0 0  Number falls in past yr: 0  0 0 0  Injury with Fall? 0  1 0 0  Comment   back was injured    Risk for fall due to :   Impaired balance/gait Medication side effect Medication side effect  Follow up Falls evaluation completed;Falls prevention discussed Falls evaluation completed Falls prevention discussed Falls evaluation completed;Falls prevention discussed Falls evaluation completed;Falls prevention discussed    FALL RISK PREVENTION PERTAINING TO THE HOME: Home free of loose throw rugs in walkways, pet beds, electrical cords, etc? Yes  Adequate lighting in your home to reduce risk of falls? Yes   ASSISTIVE DEVICES UTILIZED TO PREVENT  FALLS: Life alert? No  Security system? Yes Use of a cane, walker or w/c? Yes , cane at times Grab bars in the bathroom? Yes  Shower chair or bench in shower? No  Comfort chair height toilet? Yes   TIMED UP AND GO: Was the test performed? No .   Cognitive Function:    03/12/2020   11:13 AM 03/05/2019    3:40 PM 02/27/2018    8:58 AM 02/09/2017    8:38 AM 02/09/2016    9:46 AM  MMSE - Mini Mental State Exam  Orientation to time 5 5 5 5 5   Orientation to Place 5 5 5 5 5   Registration 3 3 3 3 3   Attention/ Calculation 5 5 0 0 0  Recall 3 3 3 3 3   Language- name 2 objects   0 0 0  Language- repeat 1 1 1 1 1   Language- follow 3 step command   3 3 3   Language- read & follow direction   0 0 0  Write a sentence   0 0 0  Copy design   0 0 0  Total score   20 20 20         Immunizations Immunization History  Administered Date(s) Administered   Fluad Quad(high Dose 65+) 12/08/2019, 12/15/2020, 12/14/2021   Influenza Whole 11/23/2011, 12/06/2012   Influenza, High Dose Seasonal PF 11/23/2018, 12/08/2019   Influenza,inj,Quad PF,6+ Mos 11/06/2014, 10/21/2015, 11/23/2016, 11/23/2017, 11/09/2018   Influenza-Unspecified 11/22/2013   PFIZER Comirnaty(Gray Top)Covid-19 Tri-Sucrose Vaccine 06/01/2019, 06/27/2019, 03/30/2020   PFIZER(Purple Top)SARS-COV-2 Vaccination 06/01/2019, 06/27/2019, 03/30/2020   Pneumococcal Conjugate-13 05/29/2015   Pneumococcal Polysaccharide-23 10/23/2005   Respiratory Syncytial Virus Vaccine,Recomb Aduvanted(Arexvy) 02/24/2022   Td 01/14/2012   TDAP status: Due, Education has been provided regarding the importance of this vaccine. Advised may receive this vaccine at local pharmacy or Health Dept. Aware to provide a copy of the vaccination record if obtained from local pharmacy or Health Dept. Verbalized acceptance and understanding.  Shingrix Completed?: No.    Education has been provided regarding the importance of this vaccine. Patient has been advised to call  insurance company to determine out of pocket expense if they have not yet received this vaccine. Advised may also receive vaccine at local pharmacy or Health Dept. Verbalized acceptance and understanding.  Screening Tests Health Maintenance  Topic Date Due   Diabetic kidney evaluation - Urine ACR  03/05/2020   DTaP/Tdap/Td (2 - Tdap) 01/13/2022   COVID-19 Vaccine (7 - 2023-24 season) 03/31/2022 (Originally 10/23/2021)   Zoster Vaccines- Shingrix (1 of 2) 06/14/2022 (Originally 10/15/1959)   HEMOGLOBIN A1C  06/28/2022   FOOT EXAM  08/14/2022   Diabetic kidney evaluation - eGFR measurement  12/29/2022   OPHTHALMOLOGY EXAM  02/24/2023   Medicare Annual Wellness (AWV)  03/16/2023   Pneumonia Vaccine 35+ Years old  Completed   INFLUENZA VACCINE  Completed   DEXA SCAN  Completed   HPV VACCINES  Aged Out    Health Maintenance Health Maintenance Due  Topic Date Due   Diabetic kidney evaluation - Urine ACR  03/05/2020   DTaP/Tdap/Td (2 - Tdap) 01/13/2022   Lung Cancer Screening: (Low Dose CT Chest recommended if Age 54-80 years, 30 pack-year currently smoking OR have quit w/in 15years.) does not qualify.   Hepatitis C Screening: does not qualify.  Vision Screening: Recommended annual ophthalmology exams for early detection of glaucoma and other disorders of the eye.  Dental Screening: Recommended annual dental exams for proper oral hygiene.  Community Resource Referral / Chronic Care Management: CRR required this visit?  No   CCM required this visit?  No      Plan:     I have personally reviewed and noted the following in the patient's chart:   Medical and social history Use of alcohol, tobacco or illicit drugs  Current medications and supplements including opioid prescriptions. Patient is not currently taking opioid prescriptions. Functional ability and status Nutritional status Physical activity Advanced directives List of other physicians Hospitalizations, surgeries, and  ER visits in previous 12 months Vitals Screenings to include cognitive, depression, and falls Referrals and appointments  In addition, I have reviewed and discussed with patient certain preventive protocols, quality metrics, and best practice recommendations. A written personalized care plan for preventive services as well as general preventive health recommendations were provided to patient.     Leta Jungling, LPN   [id number]

## 2022-03-15 NOTE — Patient Instructions (Addendum)
Jordan Patterson , Thank you for taking time to come for your Medicare Wellness Visit. I appreciate your ongoing commitment to your health goals. Please review the following plan we discussed and let me know if I can assist you in the future.   These are the goals we discussed:  Goals Addressed               This Visit's Progress     Patient Stated     Maintain healthy lifestyle (pt-stated)        Stay active Stay hydrated Healthy diet         This is a list of the screening recommended for you and due dates:  Health Maintenance  Topic Date Due   Yearly kidney health urinalysis for diabetes  03/05/2020   DTaP/Tdap/Td vaccine (2 - Tdap) 01/13/2022   COVID-19 Vaccine (7 - 2023-24 season) 03/31/2022*   Zoster (Shingles) Vaccine (1 of 2) 06/14/2022*   Hemoglobin A1C  06/28/2022   Complete foot exam   08/14/2022   Yearly kidney function blood test for diabetes  12/29/2022   Eye exam for diabetics  02/24/2023   Medicare Annual Wellness Visit  03/16/2023   Pneumonia Vaccine  Completed   Flu Shot  Completed   DEXA scan (bone density measurement)  Completed   HPV Vaccine  Aged Out  *Topic was postponed. The date shown is not the original due date.    Advanced directives: on file  Conditions/risks identified: none new  Next appointment: Follow up in one year for your annual wellness visit    Preventive Care 65 Years and Older, Female Preventive care refers to lifestyle choices and visits with your health care provider that can promote health and wellness. What does preventive care include? A yearly physical exam. This is also called an annual well check. Dental exams once or twice a year. Routine eye exams. Ask your health care provider how often you should have your eyes checked. Personal lifestyle choices, including: Daily care of your teeth and gums. Regular physical activity. Eating a healthy diet. Avoiding tobacco and drug use. Limiting alcohol use. Practicing safe  sex. Taking low-dose aspirin every day. Taking vitamin and mineral supplements as recommended by your health care provider. What happens during an annual well check? The services and screenings done by your health care provider during your annual well check will depend on your age, overall health, lifestyle risk factors, and family history of disease. Counseling  Your health care provider may ask you questions about your: Alcohol use. Tobacco use. Drug use. Emotional well-being. Home and relationship well-being. Sexual activity. Eating habits. History of falls. Memory and ability to understand (cognition). Work and work Statistician. Reproductive health. Screening  You may have the following tests or measurements: Height, weight, and BMI. Blood pressure. Lipid and cholesterol levels. These may be checked every 5 years, or more frequently if you are over 16 years old. Skin check. Lung cancer screening. You may have this screening every year starting at age 80 if you have a 30-pack-year history of smoking and currently smoke or have quit within the past 15 years. Fecal occult blood test (FOBT) of the stool. You may have this test every year starting at age 71. Flexible sigmoidoscopy or colonoscopy. You may have a sigmoidoscopy every 5 years or a colonoscopy every 10 years starting at age 17. Hepatitis C blood test. Hepatitis B blood test. Sexually transmitted disease (STD) testing. Diabetes screening. This is done by checking your blood  sugar (glucose) after you have not eaten for a while (fasting). You may have this done every 1-3 years. Bone density scan. This is done to screen for osteoporosis. You may have this done starting at age 63. Mammogram. This may be done every 1-2 years. Talk to your health care provider about how often you should have regular mammograms. Talk with your health care provider about your test results, treatment options, and if necessary, the need for more  tests. Vaccines  Your health care provider may recommend certain vaccines, such as: Influenza vaccine. This is recommended every year. Tetanus, diphtheria, and acellular pertussis (Tdap, Td) vaccine. You may need a Td booster every 10 years. Zoster vaccine. You may need this after age 44. Pneumococcal 13-valent conjugate (PCV13) vaccine. One dose is recommended after age 75. Pneumococcal polysaccharide (PPSV23) vaccine. One dose is recommended after age 42. Talk to your health care provider about which screenings and vaccines you need and how often you need them. This information is not intended to replace advice given to you by your health care provider. Make sure you discuss any questions you have with your health care provider. Document Released: 03/07/2015 Document Revised: 10/29/2015 Document Reviewed: 12/10/2014 Elsevier Interactive Patient Education  2017 [place] Prevention in the Home Falls can cause injuries. They can happen to people of all ages. There are many things you can do to make your home safe and to help prevent falls. What can I do on the outside of my home? Regularly fix the edges of walkways and driveways and fix any cracks. Remove anything that might make you trip as you walk through a door, such as a raised step or threshold. Trim any bushes or trees on the path to your home. Use bright outdoor lighting. Clear any walking paths of anything that might make someone trip, such as rocks or tools. Regularly check to see if handrails are loose or broken. Make sure that both sides of any steps have handrails. Any raised decks and porches should have guardrails on the edges. Have any leaves, snow, or ice cleared regularly. Use sand or salt on walking paths during winter. Clean up any spills in your garage right away. This includes oil or grease spills. What can I do in the bathroom? Use night lights. Install grab bars by the toilet and in the tub and shower.  Do not use towel bars as grab bars. Use non-skid mats or decals in the tub or shower. If you need to sit down in the shower, use a plastic, non-slip stool. Keep the floor dry. Clean up any water that spills on the floor as soon as it happens. Remove soap buildup in the tub or shower regularly. Attach bath mats securely with double-sided non-slip rug tape. Do not have throw rugs and other things on the floor that can make you trip. What can I do in the bedroom? Use night lights. Make sure that you have a light by your bed that is easy to reach. Do not use any sheets or blankets that are too big for your bed. They should not hang down onto the floor. Have a firm chair that has side arms. You can use this for support while you get dressed. Do not have throw rugs and other things on the floor that can make you trip. What can I do in the kitchen? Clean up any spills right away. Avoid walking on wet floors. Keep items that you use a lot in easy-to-reach  places. If you need to reach something above you, use a strong step stool that has a grab bar. Keep electrical cords out of the way. Do not use floor polish or wax that makes floors slippery. If you must use wax, use non-skid floor wax. Do not have throw rugs and other things on the floor that can make you trip. What can I do with my stairs? Do not leave any items on the stairs. Make sure that there are handrails on both sides of the stairs and use them. Fix handrails that are broken or loose. Make sure that handrails are as long as the stairways. Check any carpeting to make sure that it is firmly attached to the stairs. Fix any carpet that is loose or worn. Avoid having throw rugs at the top or bottom of the stairs. If you do have throw rugs, attach them to the floor with carpet tape. Make sure that you have a light switch at the top of the stairs and the bottom of the stairs. If you do not have them, ask someone to add them for you. What else  can I do to help prevent falls? Wear shoes that: Do not have high heels. Have rubber bottoms. Are comfortable and fit you well. Are closed at the toe. Do not wear sandals. If you use a stepladder: Make sure that it is fully opened. Do not climb a closed stepladder. Make sure that both sides of the stepladder are locked into place. Ask someone to hold it for you, if possible. Clearly mark and make sure that you can see: Any grab bars or handrails. First and last steps. Where the edge of each step is. Use tools that help you move around (mobility aids) if they are needed. These include: Canes. Walkers. Scooters. Crutches. Turn on the lights when you go into a dark area. Replace any light bulbs as soon as they burn out. Set up your furniture so you have a clear path. Avoid moving your furniture around. If any of your floors are uneven, fix them. If there are any pets around you, be aware of where they are. Review your medicines with your doctor. Some medicines can make you feel dizzy. This can increase your chance of falling. Ask your doctor what other things that you can do to help prevent falls. This information is not intended to replace advice given to you by your health care provider. Make sure you discuss any questions you have with your health care provider. Document Released: 12/05/2008 Document Revised: 07/17/2015 Document Reviewed: 03/15/2014 Elsevier Interactive Patient Education  2017 [employer].

## 2022-03-18 ENCOUNTER — Telehealth: Payer: Self-pay

## 2022-03-18 NOTE — Telephone Encounter (Signed)
Patient called requested that we leave a urine cup at reception for her to pick up. She has CPE labs and knows that due to DM she will need to have microalbumin done. She has hard time with urination and would like to take kit home and bring sample in day of labs. I have placed cup, bag and hat at reception for pick up. Advised to do day of lab and not to bring in before lab appointment. She will call if any questions.

## 2022-03-18 NOTE — Telephone Encounter (Signed)
I has been more than our 60 day window for surgery clearance ( LOV was 12/01/21). If surgery needs something more, then please bring her in with me or an APP for a preop clearance visit.

## 2022-03-18 NOTE — Telephone Encounter (Signed)
Dr Annamaria Boots,  This patient last saw you in October and you said she could proceed with surgery and when I fax the note to Urogynecology they faxed me back that they are needing more detail on the note. Could you possibly look at last office note for me and add more to the note if you believe that she is clear for surgery on a pulmonary stand point sir.  Thank you  Surgery is 04/19/2022

## 2022-03-19 NOTE — Telephone Encounter (Signed)
Hey ladies   I have tried several times to call this patient in regards to her surgical clearance. Dr Annamaria Boots is needing her to be seen again. Her surgery is 04/19/2022.   Can we try to tag team this one and get a hold of her soon to get her seen by an APP or Dr Annamaria Boots.   Thank you   Miriam Liles

## 2022-03-22 DIAGNOSIS — M9902 Segmental and somatic dysfunction of thoracic region: Secondary | ICD-10-CM | POA: Diagnosis not present

## 2022-03-22 DIAGNOSIS — S233XXA Sprain of ligaments of thoracic spine, initial encounter: Secondary | ICD-10-CM | POA: Diagnosis not present

## 2022-03-22 NOTE — Telephone Encounter (Signed)
Fax received from Dr. Sherlene Shams with Urogynecology at [facility]  to perform a reconstructive surgery on patient.  Patient needs surgery clearance. Surgery is 04/19/2022. Patient was seen on 12/01/2021. Office protocol is a risk assessment can be sent to surgeon if patient has been seen in 60 days or less.   Sending to Roxan Diesel NP for risk assessment or recommendations if patient needs to be seen in office prior to surgical procedure.    Patient has office visit with Katie on 04/01/2022 for surgical clearance

## 2022-03-22 NOTE — Telephone Encounter (Signed)
Patient is scheduled on 04/01/2022 with Roxan Diesel, NP for preop clearance visit.

## 2022-03-23 ENCOUNTER — Other Ambulatory Visit: Payer: Self-pay | Admitting: Internal Medicine

## 2022-03-23 ENCOUNTER — Telehealth: Payer: Self-pay | Admitting: Family Medicine

## 2022-03-23 DIAGNOSIS — E871 Hypo-osmolality and hyponatremia: Secondary | ICD-10-CM

## 2022-03-23 DIAGNOSIS — E119 Type 2 diabetes mellitus without complications: Secondary | ICD-10-CM

## 2022-03-23 DIAGNOSIS — I10 Essential (primary) hypertension: Secondary | ICD-10-CM

## 2022-03-23 DIAGNOSIS — E1169 Type 2 diabetes mellitus with other specified complication: Secondary | ICD-10-CM

## 2022-03-23 NOTE — Telephone Encounter (Signed)
-----  Message from Velna Hatchet, RT sent at 03/09/2022 10:28 AM EST ----- Regarding: Wed 1/31 lab Future lab orders needed for labs only appt on 03/24/22, please.  Thanks, Anda Kraft

## 2022-03-24 ENCOUNTER — Other Ambulatory Visit (INDEPENDENT_AMBULATORY_CARE_PROVIDER_SITE_OTHER): Payer: Medicare PPO

## 2022-03-24 DIAGNOSIS — E119 Type 2 diabetes mellitus without complications: Secondary | ICD-10-CM | POA: Diagnosis not present

## 2022-03-24 DIAGNOSIS — E1169 Type 2 diabetes mellitus with other specified complication: Secondary | ICD-10-CM

## 2022-03-24 DIAGNOSIS — E785 Hyperlipidemia, unspecified: Secondary | ICD-10-CM

## 2022-03-24 DIAGNOSIS — E871 Hypo-osmolality and hyponatremia: Secondary | ICD-10-CM | POA: Diagnosis not present

## 2022-03-24 DIAGNOSIS — I10 Essential (primary) hypertension: Secondary | ICD-10-CM

## 2022-03-24 LAB — LIPID PANEL
Cholesterol: 225 mg/dL — ABNORMAL HIGH (ref 0–200)
HDL: 56.9 mg/dL (ref >39.00)
LDL Cholesterol: 142 mg/dL — ABNORMAL HIGH (ref 0–99)
NonHDL: 167.81
Total CHOL/HDL Ratio: 4
Triglycerides: 131 mg/dL (ref 0.0–149.0)
VLDL: 26.2 mg/dL (ref 0.0–40.0)

## 2022-03-24 LAB — COMPREHENSIVE METABOLIC PANEL
ALT: 22 U/L (ref 0–35)
AST: 27 U/L (ref 0–37)
Albumin: 4.5 g/dL (ref 3.5–5.2)
Alkaline Phosphatase: 52 U/L (ref 39–117)
BUN: 10 mg/dL (ref 6–23)
CO2: 28 mEq/L (ref 19–32)
Calcium: 9.2 mg/dL (ref 8.4–10.5)
Chloride: 88 mEq/L — ABNORMAL LOW (ref 96–112)
Creatinine, Ser: 0.76 mg/dL (ref 0.40–1.20)
GFR: 73.48 mL/min (ref >60.00)
Glucose, Bld: 134 mg/dL — ABNORMAL HIGH (ref 70–99)
Potassium: 3.9 mEq/L (ref 3.5–5.1)
Sodium: 126 mEq/L — ABNORMAL LOW (ref 135–145)
Total Bilirubin: 0.5 mg/dL (ref 0.2–1.2)
Total Protein: 7.1 g/dL (ref 6.0–8.3)

## 2022-03-24 LAB — HEMOGLOBIN A1C: Hgb A1c MFr Bld: 6.6 % — ABNORMAL HIGH (ref 4.6–6.5)

## 2022-03-25 LAB — MICROALBUMIN / CREATININE URINE RATIO
Creatinine,U: 67.3 mg/dL
Microalb Creat Ratio: 15.3 mg/g (ref 0.0–30.0)
Microalb, Ur: 10.3 mg/dL — ABNORMAL HIGH (ref 0.0–1.9)

## 2022-03-25 NOTE — Telephone Encounter (Signed)
OK refill albuterol MDI x 3.  Per chart:  Return in about 6 months (around 06/02/2022).

## 2022-03-25 NOTE — Progress Notes (Addendum)
Jordan Patterson is a 82 y.o. female called and notified of that FMLA paper were received by the [employer] employee federal credit [employer]) to [place] for her daughter [name].   Pt notified there will be a 10-14 day turn around for forms to be ready.  Pt notified  she will be contacted as soon as the from are ready and have been faxed.  Pre-op date: 03/30/22 Surgery date: 04/19/22 Post op date:05/31/22

## 2022-03-29 ENCOUNTER — Ambulatory Visit: Payer: Medicare PPO | Attending: Cardiology | Admitting: Nurse Practitioner

## 2022-03-29 DIAGNOSIS — Z01810 Encounter for preprocedural cardiovascular examination: Secondary | ICD-10-CM | POA: Diagnosis not present

## 2022-03-29 NOTE — Progress Notes (Signed)
Virtual Visit via Telephone Note   Because of Jordan Patterson co-morbid illnesses, she is at least at moderate risk for complications without adequate follow up.  This format is felt to be most appropriate for this patient at this time.  The patient did not have access to video technology/had technical difficulties with video requiring transitioning to audio format only (telephone).  All issues noted in this document were discussed and addressed.  No physical exam could be performed with this format.  Please refer to the patient's chart for her consent to telehealth for [facility].  Evaluation Performed:  Preoperative cardiovascular risk assessment _____________   Date:  03/29/2022   Patient ID:  Jordan Patterson, DOB Jul 29, 1940, MRN [id number] Patient Location:  Home Provider location:   Office  Primary Care Provider:  Abner Greenspan, MD Primary Cardiologist:  Sinclair Grooms, MD (Inactive)  Chief Complaint / Patient Profile   82 y.o. y/o female with a h/o hypertension, hyperlipidemia, esophageal stricture s/p dilation, GERD, and thoracic compression fracture who is pending LeForte colpocleisis; vaginal robotic procedure with Dr. Sherlene Shams or Urogynecology at [facility] and presents today for telephonic preoperative cardiovascular risk assessment.  History of Present Illness    Jordan Patterson is a 82 y.o. female who presents via audio/video conferencing for a telehealth visit today. Pt was last seen in cardiology clinic on 11/10/2021 by Richardson Dopp, PA.  At that time YURANI FETTES was doing well.  The patient is now pending procedure as outlined above. Since her last visit, she has done well from a cardiac standpoint.   She denies chest pain, palpitations, dyspnea, pnd, orthopnea, n, v, dizziness, syncope, edema, weight gain, or early satiety. All other systems reviewed and are otherwise negative except as noted above.   Past Medical History     Past Medical History:  Diagnosis Date   Allergic rhinitis    Arthritis    hands and knees - ?osteo   Asthma    Chronic headaches    Compression fracture of T12 vertebra ([place]) 07/18/2014   S/p kyphoplasty by Dr Sherwood Gambler (08/2014)    Depression    pt denies   Diabetes type 2, controlled ([place]) 2004   Environmental allergies    dust,mold,mildew   Extrinsic asthma    GERD (gastroesophageal reflux disease)    Olevia Perches) EGD - mild esophageal dysmotility and small hiatal hernia   Hiatal hernia    Hx of migraines    Hyperlipidemia    mild, diet controlled   Hypertension    Hypothyroidism    Macular degeneration    Osteoporosis with fracture 07/2014   T -1.2 hip, T 0.2 spine, T12 compression fracture s/p kyphoplasty   Past Surgical History:  Procedure Laterality Date   28 HOUR [place] STUDY N/A 04/11/2017   Procedure: 24 HOUR PH STUDY;  Surgeon: Mauri Pole, MD;  Location: [facility];  Service: Endoscopy;  Laterality: N/A;   BACK SURGERY     CATARACT EXTRACTION  02/22/2001   bilateral with lens implant   COLONOSCOPY     DEXA  05/23/2008   improved (initial DEXA 2007 - mild osteopenia)   DILATION AND CURETTAGE OF UTERUS  02/23/1976   ESOPHAGEAL MANOMETRY N/A 04/11/2017   Procedure: ESOPHAGEAL MANOMETRY (EM);  Surgeon: Mauri Pole, MD;  Location: [facility];  Service: Endoscopy;  Laterality: N/A;   ESOPHAGOGASTRODUODENOSCOPY  06/25/2011   Procedure: ESOPHAGOGASTRODUODENOSCOPY (EGD);  Surgeon: Lafayette Dragon,  MD;  Location: [facility];  Service: Endoscopy;  Laterality: N/A;  no Xray   EYE SURGERY     KYPHOPLASTY N/A 08/30/2014   Procedure: THORACIC TWELVE KYPHOPLASTY;  Surgeon: Jovita Gamma, MD   KYPHOPLASTY N/A 01/22/2021   Procedure: Thoracic eight, Thoracic nine KYPHOPLASTY;  Surgeon: Ashok Pall, MD;  Location: [place];  Service: Neurosurgery;  Laterality: N/A;   LAPAROSCOPY ABDOMEN DIAGNOSTIC     To R/O endometriosis   PH IMPEDANCE STUDY N/A 04/11/2017    Procedure: [place] IMPEDANCE STUDY;  Surgeon: Mauri Pole, MD;  Location: [facility];  Service: Endoscopy;  Laterality: N/A;   SAVORY DILATION  06/25/2011   Procedure: SAVORY DILATION;  Surgeon: Lafayette Dragon, MD;  Location: [facility];  Service: Endoscopy;  Laterality: N/A;   TONSILLECTOMY AND ADENOIDECTOMY  02/23/1960   US ECHOCARDIOGRAPHY  57/32/2025   nl systolic function EF 42%, mild diastolic dysfunction    Allergies  Allergies  Allergen Reactions   Ace Inhibitors Cough   Crestor [Rosuvastatin] Other (See Comments)    myalgias   Wellbutrin [Bupropion Hcl] Other (See Comments)    Unknown reaction   Cymbalta [Duloxetine Hcl] Palpitations and Other (See Comments)    headaches   Lipitor [Atorvastatin] Cough   Pregabalin Other (See Comments)    Unknown reaction   Tegretol [Carbamazepine] Other (See Comments)    Dizziness, headache    Home Medications    Prior to Admission medications   Medication Sig Start Date End Date Taking? Authorizing Provider  Accu-Chek Softclix Lancets lancets Use as instructed 01/05/22   Tower, Wynelle Fanny, MD  acetaminophen (TYLENOL) 325 MG tablet Take 650 mg by mouth every 6 (six) hours as needed.    [provider]  albuterol (VENTOLIN HFA) 108 (90 Base) MCG/ACT inhaler TAKE 2 PUFFS BY MOUTH EVERY 6 HOURS AS NEEDED FOR WHEEZE OR SHORTNESS OF BREATH 03/25/22   Young, Clinton D, MD  amLODipine (NORVASC) 5 MG tablet TAKE 1 TABLET (5 MG TOTAL) BY MOUTH DAILY. 01/19/22   Belva Crome, MD  aspirin 81 MG tablet Take 81 mg by mouth daily.    [provider]  blood glucose meter kit and supplies Dispense based on patient and insurance preference. Use qd and prn for uncontrolled diabetes. (FOR ICD-10 E10.9, E11.9). 12/30/21   Tower, Wynelle Fanny, MD  Budeson-Glycopyrrol-Formoterol (BREZTRI AEROSPHERE) 160-9-4.8 MCG/ACT AERO TAKE 2 PUFFS BY MOUTH TWICE A DAY 05/25/21   Baird Lyons D, MD  Calcium 250 MG CAPS Take 500 mg by mouth in the morning and  at bedtime.    [provider]  chlorpheniramine (CHLOR-TRIMETON) 4 MG tablet Take 4 mg by mouth in the morning, at noon, and at bedtime.    [provider]  chlorthalidone (HYGROTON) 25 MG tablet TAKE 1 TABLET (25 MG TOTAL) BY MOUTH DAILY. 12/17/21   Belva Crome, MD  Cholecalciferol (VITAMIN D3) 1000 units CAPS Take 1 capsule (1,000 Units total) by mouth daily. 02/18/17   Ria Bush, MD  esomeprazole (NEXIUM) 40 MG capsule 1 capsule by mouth every day before breakfast 05/11/21   Mauri Pole, MD  famotidine (PEPCID) 20 MG tablet TAKE 1 TABLET BY MOUTH EVERYDAY AT BEDTIME 05/11/21   Mauri Pole, MD  ferrous sulfate 325 (65 FE) MG EC tablet Take 325 mg by mouth every Monday, Wednesday, and Friday.    [provider]  Garlic (GARLIQUE PO) Take 1 tablet by mouth daily.    [provider]  glucose blood  test strip Use as instructed 01/05/22   Tower, Wynelle Fanny, MD  levothyroxine (SYNTHROID) 100 MCG tablet TAKE 1 TABLET BY MOUTH EVERY DAY BEFORE BREAKFAST 10/21/21   Tower, Wynelle Fanny, MD  MAGNESIUM PO Take 2 tablets by mouth daily.    [provider]  metFORMIN (GLUCOPHAGE) 1000 MG tablet Take 1 tablet (1,000 mg total) by mouth 2 (two) times daily with a meal. 02/01/22   Tower, Wynelle Fanny, MD  montelukast (SINGULAIR) 10 MG tablet TAKE 1 TABLET BY MOUTH EVERYDAY AT BEDTIME 01/28/22   Young, Kasandra Knudsen, MD  Multiple Vitamins-Minerals (VISION HEALTH PO) Take by mouth.    [provider]  Nutritional Supplements (GLUCOSE MANAGEMENT PO) Take by mouth.    [provider]  Omega-3 Fatty Acids (OMEGA 3 500 PO) Take 1 capsule by mouth in the morning and at bedtime.    [provider]  polyethylene glycol (MIRALAX / GLYCOLAX) packet Take 17 g by mouth 2 (two) times a week.    [provider]  Polyvinyl Alcohol-Povidone (REFRESH OP) Place 1 drop into both eyes daily.     [provider]  Specialty Vitamins Products  (CARDIOVASCULAR SUPPORT PO) Take 2 capsules by mouth daily.    [provider]  valsartan (DIOVAN) 320 MG tablet TAKE 1 TABLET BY MOUTH EVERY DAY 11/25/21   Belva Crome, MD    Physical Exam    Vital Signs:  OLLIE DELANO does not have vital signs available for review today.  Given telephonic nature of communication, physical exam is limited. AAOx3. NAD. Normal affect.  Speech and respirations are unlabored.  Accessory Clinical Findings    None  Assessment & Plan    1.  Preoperative Cardiovascular Risk Assessment:  According to the Revised Cardiac Risk Index (RCRI), her Perioperative Risk of Major Cardiac Event is (%): 0.4. Her Functional Capacity in METs is: 7.34 according to the [place] Activity Status Index (DASI). Therefore, based on ACC/AHA guidelines, patient would be at acceptable risk for the planned procedure without further cardiovascular testing.   The patient was advised that if she develops new symptoms prior to surgery to contact our office to arrange for a follow-up visit, and she verbalized understanding.  Per office protocol, she may hold Aspirin for 7 days prior to procedure. Please resume Aspirin as soon as possible postprocedure, at the discretion of the surgeon.    A copy of this note will be routed to requesting surgeon.  Time:   Today, I have spent 7 minutes with the patient with telehealth technology discussing medical history, symptoms, and management plan.     Lenna Sciara, NP  03/29/2022, 9:13 AM

## 2022-03-29 NOTE — Progress Notes (Unsigned)
[facility] Pre-Operative H&P  Subjective Chief Complaint: Jordan Patterson presents for a preoperative encounter.   History of Present Illness: Jordan Patterson is a 82 y.o. female who presents for preoperative visit.  She is scheduled to undergo Colpocleisis, levator plication, perineorrhaphy and cystoscopy  on 04/19/2022.  Her symptoms include pelvic organ prolapse, and she was was found to have Stage IV anterior, Stage III posterior, Stage III apical prolapse.   Urodynamics showed: Deferred  Past Medical History:  Diagnosis Date   Allergic rhinitis    Arthritis    hands and knees - ?osteo   Asthma    Chronic headaches    Compression fracture of T12 vertebra ([place]) 07/18/2014   S/p kyphoplasty by Dr Sherwood Gambler (08/2014)    Depression    pt denies   Diabetes type 2, controlled ([place]) 2004   Environmental allergies    dust,mold,mildew   Extrinsic asthma    GERD (gastroesophageal reflux disease)    Olevia Perches) EGD - mild esophageal dysmotility and small hiatal hernia   Hiatal hernia    Hx of migraines    Hyperlipidemia    mild, diet controlled   Hypertension    Hypothyroidism    Macular degeneration    Osteoporosis with fracture 07/2014   T -1.2 hip, T 0.2 spine, T12 compression fracture s/p kyphoplasty     Past Surgical History:  Procedure Laterality Date   79 HOUR [place] STUDY N/A 04/11/2017   Procedure: 24 HOUR PH STUDY;  Surgeon: Mauri Pole, MD;  Location: [facility];  Service: Endoscopy;  Laterality: N/A;   BACK SURGERY     CATARACT EXTRACTION  02/22/2001   bilateral with lens implant   COLONOSCOPY     DEXA  05/23/2008   improved (initial DEXA 2007 - mild osteopenia)   DILATION AND CURETTAGE OF UTERUS  02/23/1976   ESOPHAGEAL MANOMETRY N/A 04/11/2017   Procedure: ESOPHAGEAL MANOMETRY (EM);  Surgeon: Mauri Pole, MD;  [facility];  Service: Endoscopy;  Laterality: N/A;   ESOPHAGOGASTRODUODENOSCOPY  06/25/2011   Procedure:  ESOPHAGOGASTRODUODENOSCOPY (EGD);  Surgeon: Lafayette Dragon, MD;  Location: [facility];  Service: Endoscopy;  Laterality: N/A;  no Xray   EYE SURGERY     KYPHOPLASTY N/A 08/30/2014   Procedure: THORACIC TWELVE KYPHOPLASTY;  Surgeon: Jovita Gamma, MD   KYPHOPLASTY N/A 01/22/2021   Procedure: Thoracic eight, Thoracic nine KYPHOPLASTY;  Surgeon: Ashok Pall, MD;  Location: [facility];  Service: Neurosurgery;  Laterality: N/A;   LAPAROSCOPY ABDOMEN DIAGNOSTIC     To R/O endometriosis   PH IMPEDANCE STUDY N/A 04/11/2017   Procedure: [place] IMPEDANCE STUDY;  Surgeon: Mauri Pole, MD;  [facility]: [facility];  Service: Endoscopy;  Laterality: N/A;   SAVORY DILATION  06/25/2011   Procedure: SAVORY DILATION;  Surgeon: Lafayette Dragon, MD;  [facility]: [facility];  Service: Endoscopy;  Laterality: N/A;   TONSILLECTOMY AND ADENOIDECTOMY  02/23/1960   US ECHOCARDIOGRAPHY  13/09/6576   nl systolic function EF 46%, mild diastolic dysfunction    is allergic to ace inhibitors, crestor [rosuvastatin], wellbutrin [bupropion hcl], cymbalta [duloxetine hcl], lipitor [atorvastatin], pregabalin, and tegretol [carbamazepine].   Family History  Problem Relation Age of Onset   Mitral valve prolapse Father    Diabetes Father    Mitral valve prolapse Mother    Hypertension Mother    Stroke Mother 10       after valve surgery   Parkinson's disease Brother    Other Brother  POST WAR TRAUMA   Stroke Paternal Grandfather    Heart failure Maternal Grandfather    Cancer Neg Hx    Colon cancer Neg Hx    Stomach cancer Neg Hx    Esophageal cancer Neg Hx    Pancreatic cancer Neg Hx    Liver disease Neg Hx     Social History   Tobacco Use   Smoking status: Never    Passive exposure: Never   Smokeless tobacco: Never  Vaping Use   Vaping Use: Never used  Substance Use Topics   Alcohol use: No    Alcohol/week: 0.0 standard drinks of alcohol   Drug use: No     Review of Systems was negative for  a full 10 system review except as noted in the History of Present Illness.   Current Outpatient Medications:    Accu-Chek Softclix Lancets lancets, Use as instructed, Disp: 100 each, Rfl: 12   acetaminophen (TYLENOL) 325 MG tablet, Take 650 mg by mouth every 6 (six) hours as needed., Disp: , Rfl:    albuterol (VENTOLIN HFA) 108 (90 Base) MCG/ACT inhaler, TAKE 2 PUFFS BY MOUTH EVERY 6 HOURS AS NEEDED FOR WHEEZE OR SHORTNESS OF BREATH, Disp: 18 each, Rfl: 2   amLODipine (NORVASC) 5 MG tablet, TAKE 1 TABLET (5 MG TOTAL) BY MOUTH DAILY., Disp: 90 tablet, Rfl: 1   aspirin 81 MG tablet, Take 81 mg by mouth daily., Disp: , Rfl:    blood glucose meter kit and supplies, Dispense based on patient and insurance preference. Use qd and prn for uncontrolled diabetes. (FOR ICD-10 E10.9, E11.9)., Disp: 1 each, Rfl: 1   Budeson-Glycopyrrol-Formoterol (BREZTRI AEROSPHERE) 160-9-4.8 MCG/ACT AERO, TAKE 2 PUFFS BY MOUTH TWICE A DAY, Disp: 32.1 g, Rfl: 3   Calcium 250 MG CAPS, Take 500 mg by mouth in the morning and at bedtime., Disp: , Rfl:    chlorpheniramine (CHLOR-TRIMETON) 4 MG tablet, Take 4 mg by mouth in the morning, at noon, and at bedtime., Disp: , Rfl:    chlorthalidone (HYGROTON) 25 MG tablet, TAKE 1 TABLET (25 MG TOTAL) BY MOUTH DAILY., Disp: 90 tablet, Rfl: 3   Cholecalciferol (VITAMIN D3) 1000 units CAPS, Take 1 capsule (1,000 Units total) by mouth daily., Disp: 30 capsule, Rfl:    esomeprazole (NEXIUM) 40 MG capsule, 1 capsule by mouth every day before breakfast, Disp: 90 capsule, Rfl: 6   famotidine (PEPCID) 20 MG tablet, TAKE 1 TABLET BY MOUTH EVERYDAY AT BEDTIME, Disp: 90 tablet, Rfl: 6   ferrous sulfate 325 (65 FE) MG EC tablet, Take 325 mg by mouth every Monday, Wednesday, and Friday., Disp: , Rfl:    Garlic (GARLIQUE PO), Take 1 tablet by mouth daily., Disp: , Rfl:    glucose blood test strip, Use as instructed, Disp: 100 each, Rfl: 12   levothyroxine (SYNTHROID) 100 MCG tablet, TAKE 1 TABLET BY  MOUTH EVERY DAY BEFORE BREAKFAST, Disp: 90 tablet, Rfl: 2   MAGNESIUM PO, Take 2 tablets by mouth daily., Disp: , Rfl:    metFORMIN (GLUCOPHAGE) 1000 MG tablet, Take 1 tablet (1,000 mg total) by mouth 2 (two) times daily with a meal., Disp: 180 tablet, Rfl: 3   montelukast (SINGULAIR) 10 MG tablet, TAKE 1 TABLET BY MOUTH EVERYDAY AT BEDTIME, Disp: 90 tablet, Rfl: 4   Multiple Vitamins-Minerals (VISION HEALTH PO), Take by mouth., Disp: , Rfl:    Nutritional Supplements (GLUCOSE MANAGEMENT PO), Take by mouth., Disp: , Rfl:    Omega-3 Fatty Acids (OMEGA 3  500 PO), Take 1 capsule by mouth in the morning and at bedtime., Disp: , Rfl:    polyethylene glycol (MIRALAX / GLYCOLAX) packet, Take 17 g by mouth 2 (two) times a week., Disp: , Rfl:    Polyvinyl Alcohol-Povidone (REFRESH OP), Place 1 drop into both eyes daily. , Disp: , Rfl:    Specialty Vitamins Products (CARDIOVASCULAR SUPPORT PO), Take 2 capsules by mouth daily., Disp: , Rfl:    valsartan (DIOVAN) 320 MG tablet, TAKE 1 TABLET BY MOUTH EVERY DAY, Disp: 90 tablet, Rfl: 3   Objective There were no vitals filed for this visit.  Gen: NAD CV: S1 S2 RRR Lungs: Clear to auscultation bilaterally Abd: soft, nontender   Previous Pelvic Exam showed: ***    Assessment/ Plan  Assessment: The patient is a 82 y.o. year old scheduled to undergo ***. {desc; verbal/written:16408} consent was obtained for these procedures.  Plan: General Surgical Consent: The patient has previously been counseled on alternative treatments, and the decision by the patient and provider was to proceed with the procedure listed above.  For all procedures, there are risks of bleeding, infection, damage to surrounding organs including but not limited to bowel, bladder, blood vessels, ureters and nerves, and need for further surgery if an injury were to occur. These risks are all low with minimally invasive surgery.   There are risks of numbness and weakness at any  body site or buttock/rectal pain.  It is possible that baseline pain can be worsened by surgery, either with or without mesh. If surgery is vaginal, there is also a low risk of possible conversion to laparoscopy or open abdominal incision where indicated. Very rare risks include blood transfusion, blood clot, heart attack, pneumonia, or death.   There is also a risk of short-term postoperative urinary retention with need to use a catheter. About half of patients need to go home from surgery with a catheter, which is then later removed in the office. The risk of long-term need for a catheter is very low. There is also a risk of worsening of overactive bladder.   *** Prolapse (with or without mesh): Risk factors for surgical failure  include things that put pressure on your pelvis and the surgical repair, including obesity, chronic cough, and heavy lifting or straining (including lifting children or adults, straining on the toilet, or lifting heavy objects such as furniture or anything weighing >25 lbs. Risks of recurrence is 20-30% with vaginal native tissue repair and a less than 10% with sacrocolpopexy with mesh.     We discussed consent for blood products. Risks for blood transfusion include allergic reactions, other reactions that can affect different body organs and managed accordingly, transmission of infectious diseases such as HIV or Hepatitis. However, the blood is screened. Patient consents for blood products.***  Pre-operative instructions:  She was instructed to not take Aspirin/NSAIDs x 7days prior to surgery. She may continue her 81mg  ASA.*** Antibiotic prophylaxis was ordered as indicated.  Catheter use: Patient will go home with foley if needed after post-operative voiding trial.  Post-operative instructions:  She was provided with specific post-operative instructions, including precautions and signs/symptoms for which we would recommend contacting , in addition to daytime and  after-hours contact phone numbers. This was provided on a handout.   Post-operative medications: ***Prescriptions for motrin, tylenol, miralax, and oxycodone were sent to her pharmacy. Discussed using ibuprofen and tylenol on a schedule to limit use of narcotics.   Laboratory testing:  We will check labs: ***.  Day of surgery UPT***  Preoperative clearance:  She {does/does not:19886} require surgical clearance.    Post-operative follow-up:  A post-operative appointment will be made for 6 weeks from the date of surgery. If she needs a post-operative nurse visit for a voiding trial, that will be set up after she leaves the hospital.    Patient will call the clinic or use MyChart should anything change or any new issues arise.   Berton Mount, NP   ***OR orders

## 2022-03-30 ENCOUNTER — Encounter: Payer: Self-pay | Admitting: Obstetrics and Gynecology

## 2022-03-30 ENCOUNTER — Ambulatory Visit (INDEPENDENT_AMBULATORY_CARE_PROVIDER_SITE_OTHER): Payer: Medicare PPO | Admitting: Obstetrics and Gynecology

## 2022-03-30 VITALS — BP 186/96 | HR 83 | Wt 161.8 lb

## 2022-03-30 DIAGNOSIS — I10 Essential (primary) hypertension: Secondary | ICD-10-CM

## 2022-03-30 DIAGNOSIS — Z01818 Encounter for other preprocedural examination: Secondary | ICD-10-CM

## 2022-03-30 DIAGNOSIS — N812 Incomplete uterovaginal prolapse: Secondary | ICD-10-CM

## 2022-03-30 DIAGNOSIS — N3941 Urge incontinence: Secondary | ICD-10-CM | POA: Diagnosis not present

## 2022-03-30 DIAGNOSIS — N393 Stress incontinence (female) (male): Secondary | ICD-10-CM | POA: Diagnosis not present

## 2022-03-30 MED ORDER — ACETAMINOPHEN 500 MG PO TABS: 500.0000 mg | ORAL_TABLET | Freq: Four times a day (QID) | ORAL | 0 refills | Status: AC | PRN

## 2022-03-30 MED ORDER — METHOCARBAMOL 750 MG PO TABS: 750.0000 mg | ORAL_TABLET | Freq: Four times a day (QID) | ORAL | 0 refills | Status: DC

## 2022-03-30 MED ORDER — IBUPROFEN 600 MG PO TABS: 600.0000 mg | ORAL_TABLET | Freq: Four times a day (QID) | ORAL | 0 refills | Status: DC | PRN

## 2022-03-30 MED ORDER — POLYETHYLENE GLYCOL 3350 17 GM/SCOOP PO POWD: 17.0000 g | Freq: Every day | ORAL | 0 refills | Status: DC

## 2022-03-30 NOTE — H&P (Signed)
[facility] H&P  Subjective Chief Complaint: Jordan Patterson presents for a preoperative encounter.   History of Present Illness: Jordan Patterson is a 82 y.o. female who presents for preoperative visit.  She is scheduled to undergo Colpocleisis, levator plication, perineorrhaphy and cystoscopy  on 04/19/2022.  Her symptoms include pelvic organ prolapse, and she was was found to have Stage IV anterior, Stage III posterior, Stage III apical prolapse.   Urodynamics showed: Deferred  Past Medical History:  Diagnosis Date   Allergic rhinitis    Arthritis    hands and knees - ?osteo   Asthma    Chronic headaches    Compression fracture of T12 vertebra ([place]) 07/18/2014   S/p kyphoplasty by Dr Sherwood Gambler (08/2014)    Depression    pt denies   Diabetes type 2, controlled ([place]) 2004   Environmental allergies    dust,mold,mildew   Extrinsic asthma    GERD (gastroesophageal reflux disease)    Olevia Perches) EGD - mild esophageal dysmotility and small hiatal hernia   Hiatal hernia    Hx of migraines    Hyperlipidemia    mild, diet controlled   Hypertension    Hypothyroidism    Macular degeneration    Osteoporosis with fracture 07/2014   T -1.2 hip, T 0.2 spine, T12 compression fracture s/p kyphoplasty     Past Surgical History:  Procedure Laterality Date   62 HOUR [place] STUDY N/A 04/11/2017   Procedure: 24 HOUR PH STUDY;  Surgeon: Mauri Pole, MD;  [facility];  Service: Endoscopy;  Laterality: N/A;   BACK SURGERY     CATARACT EXTRACTION  02/22/2001   bilateral with lens implant   COLONOSCOPY     DEXA  05/23/2008   improved (initial DEXA 2007 - mild osteopenia)   DILATION AND CURETTAGE OF UTERUS  02/23/1976   ESOPHAGEAL MANOMETRY N/A 04/11/2017   Procedure: ESOPHAGEAL MANOMETRY (EM);  Surgeon: Mauri Pole, MD;  [facility];  Service: Endoscopy;  Laterality: N/A;   ESOPHAGOGASTRODUODENOSCOPY  06/25/2011   Procedure: ESOPHAGOGASTRODUODENOSCOPY  (EGD);  Surgeon: Lafayette Dragon, MD;  Location: [facility];  Service: Endoscopy;  Laterality: N/A;  no Xray   EYE SURGERY     KYPHOPLASTY N/A 08/30/2014   Procedure: THORACIC TWELVE KYPHOPLASTY;  Surgeon: Jovita Gamma, MD   KYPHOPLASTY N/A 01/22/2021   Procedure: Thoracic eight, Thoracic nine KYPHOPLASTY;  Surgeon: Ashok Pall, MD;  Location: [facility];  Service: Neurosurgery;  Laterality: N/A;   LAPAROSCOPY ABDOMEN DIAGNOSTIC     To R/O endometriosis   PH IMPEDANCE STUDY N/A 04/11/2017   Procedure: [place] IMPEDANCE STUDY;  Surgeon: Mauri Pole, MD;  [facility];  Service: Endoscopy;  Laterality: N/A;   SAVORY DILATION  06/25/2011   Procedure: SAVORY DILATION;  Surgeon: Lafayette Dragon, MD;  [facility];  Service: Endoscopy;  Laterality: N/A;   TONSILLECTOMY AND ADENOIDECTOMY  02/23/1960   US ECHOCARDIOGRAPHY  38/25/0539   nl systolic function EF 76%, mild diastolic dysfunction    is allergic to ace inhibitors, crestor [rosuvastatin], wellbutrin [bupropion hcl], cymbalta [duloxetine hcl], lipitor [atorvastatin], pregabalin, and tegretol [carbamazepine].   Family History  Problem Relation Age of Onset   Mitral valve prolapse Father    Diabetes Father    Mitral valve prolapse Mother    Hypertension Mother    Stroke Mother 36       after valve surgery   Parkinson's disease Brother    Other Brother  POST WAR TRAUMA   Stroke Paternal Grandfather    Heart failure Maternal Grandfather    Cancer Neg Hx    Colon cancer Neg Hx    Stomach cancer Neg Hx    Esophageal cancer Neg Hx    Pancreatic cancer Neg Hx    Liver disease Neg Hx     Social History   Tobacco Use   Smoking status: Never    Passive exposure: Never   Smokeless tobacco: Never  Vaping Use   Vaping Use: Never used  Substance Use Topics   Alcohol use: No    Alcohol/week: 0.0 standard drinks of alcohol   Drug use: No     Review of Systems was negative for a full 10 system review  except as noted in the History of Present Illness.  No current facility-administered medications for this encounter.  Current Outpatient Medications:    Accu-Chek Softclix Lancets lancets, Use as instructed, Disp: 100 each, Rfl: 12   acetaminophen (TYLENOL) 325 MG tablet, Take 650 mg by mouth every 6 (six) hours as needed., Disp: , Rfl:    acetaminophen (TYLENOL) 500 MG tablet, Take 1 tablet (500 mg total) by mouth every 6 (six) hours as needed (pain)., Disp: 30 tablet, Rfl: 0   albuterol (VENTOLIN HFA) 108 (90 Base) MCG/ACT inhaler, TAKE 2 PUFFS BY MOUTH EVERY 6 HOURS AS NEEDED FOR WHEEZE OR SHORTNESS OF BREATH, Disp: 18 each, Rfl: 2   amLODipine (NORVASC) 5 MG tablet, TAKE 1 TABLET (5 MG TOTAL) BY MOUTH DAILY., Disp: 90 tablet, Rfl: 1   aspirin 81 MG tablet, Take 81 mg by mouth daily., Disp: , Rfl:    blood glucose meter kit and supplies, Dispense based on patient and insurance preference. Use qd and prn for uncontrolled diabetes. (FOR ICD-10 E10.9, E11.9)., Disp: 1 each, Rfl: 1   Budeson-Glycopyrrol-Formoterol (BREZTRI AEROSPHERE) 160-9-4.8 MCG/ACT AERO, TAKE 2 PUFFS BY MOUTH TWICE A DAY, Disp: 32.1 g, Rfl: 3   Calcium 250 MG CAPS, Take 500 mg by mouth in the morning and at bedtime., Disp: , Rfl:    chlorpheniramine (CHLOR-TRIMETON) 4 MG tablet, Take 4 mg by mouth in the morning, at noon, and at bedtime., Disp: , Rfl:    chlorthalidone (HYGROTON) 25 MG tablet, TAKE 1 TABLET (25 MG TOTAL) BY MOUTH DAILY. (Patient not taking: Reported on 03/30/2022), Disp: 90 tablet, Rfl: 3   Cholecalciferol (VITAMIN D3) 1000 units CAPS, Take 1 capsule (1,000 Units total) by mouth daily., Disp: 30 capsule, Rfl:    esomeprazole (NEXIUM) 40 MG capsule, 1 capsule by mouth every day before breakfast, Disp: 90 capsule, Rfl: 6   famotidine (PEPCID) 20 MG tablet, TAKE 1 TABLET BY MOUTH EVERYDAY AT BEDTIME, Disp: 90 tablet, Rfl: 6   ferrous sulfate 325 (65 FE) MG EC tablet, Take 325 mg by mouth every Monday, Wednesday, and  Friday., Disp: , Rfl:    Garlic (GARLIQUE PO), Take 1 tablet by mouth daily., Disp: , Rfl:    glucose blood test strip, Use as instructed, Disp: 100 each, Rfl: 12   ibuprofen (ADVIL) 600 MG tablet, Take 1 tablet (600 mg total) by mouth every 6 (six) hours as needed., Disp: 30 tablet, Rfl: 0   levothyroxine (SYNTHROID) 100 MCG tablet, TAKE 1 TABLET BY MOUTH EVERY DAY BEFORE BREAKFAST, Disp: 90 tablet, Rfl: 2   MAGNESIUM PO, Take 2 tablets by mouth daily., Disp: , Rfl:    metFORMIN (GLUCOPHAGE) 1000 MG tablet, Take 1 tablet (1,000 mg total) by mouth 2 (  two) times daily with a meal., Disp: 180 tablet, Rfl: 3   methocarbamol (ROBAXIN-750) 750 MG tablet, Take 1 tablet (750 mg total) by mouth 4 (four) times daily., Disp: 20 tablet, Rfl: 0   montelukast (SINGULAIR) 10 MG tablet, TAKE 1 TABLET BY MOUTH EVERYDAY AT BEDTIME, Disp: 90 tablet, Rfl: 4   Multiple Vitamins-Minerals (VISION HEALTH PO), Take by mouth., Disp: , Rfl:    Nutritional Supplements (GLUCOSE MANAGEMENT PO), Take by mouth., Disp: , Rfl:    nystatin-triamcinolone (MYCOLOG II) cream, SMARTSIG:By Mouth, Disp: , Rfl:    Omega-3 Fatty Acids (OMEGA 3 500 PO), Take 1 capsule by mouth in the morning and at bedtime., Disp: , Rfl:    polyethylene glycol (MIRALAX / GLYCOLAX) packet, Take 17 g by mouth 2 (two) times a week., Disp: , Rfl:    polyethylene glycol powder (GLYCOLAX/MIRALAX) 17 GM/SCOOP powder, Take 17 g by mouth daily. Drink 17g (1 scoop) dissolved in water per day., Disp: 255 g, Rfl: 0   Polyvinyl Alcohol-Povidone (REFRESH OP), Place 1 drop into both eyes daily. , Disp: , Rfl:    Specialty Vitamins Products (CARDIOVASCULAR SUPPORT PO), Take 2 capsules by mouth daily., Disp: , Rfl:    valsartan (DIOVAN) 320 MG tablet, TAKE 1 TABLET BY MOUTH EVERY DAY, Disp: 90 tablet, Rfl: 3   Objective There were no vitals filed for this visit. Gen: NAD CV: S1 S2 RRR Lungs: Clear to auscultation bilaterally Abd: soft, nontender  Previous Pelvic  Exam showed: Pelvic Exam: Normal external female genitalia; Bartholin's and Skene's glands normal in appearance; urethral meatus normal in appearance, no urethral masses or discharge.    CST: positive with prolapse reduced   Speculum exam reveals normal vaginal mucosa with atrophy. Cervix normal appearance. Uterus normal single, nontender. Adnexa no mass, fullness, tenderness.       Pelvic floor strength I/V Pelvic floor musculature: Right levator non-tender, Right obturator non-tender, Left levator non-tender, Left obturator non-tender   POP-Q:    POP-Q   3                                            Aa   5                                           Ba   4                                              C    7                                            Gh   4.5                                            Pb   7  tvl    2                                            Ap   2                                            Bp   -3                                              D          Rectal Exam:  Normal external rectum   Assessment/ Plan  Assessment: The patient is a 82 y.o. year old scheduled to undergo  Colpocleisis, levator plication, perineorrhaphy and cystoscopy. Verbal consent was obtained for these procedures.

## 2022-03-31 ENCOUNTER — Encounter: Payer: Self-pay | Admitting: Family Medicine

## 2022-03-31 ENCOUNTER — Encounter: Payer: Self-pay | Admitting: Obstetrics and Gynecology

## 2022-03-31 ENCOUNTER — Ambulatory Visit: Payer: Medicare PPO | Admitting: Family Medicine

## 2022-03-31 VITALS — BP 141/80 | HR 81 | Temp 97.4°F | Ht 66.0 in | Wt 162.4 lb

## 2022-03-31 DIAGNOSIS — E871 Hypo-osmolality and hyponatremia: Secondary | ICD-10-CM | POA: Diagnosis not present

## 2022-03-31 DIAGNOSIS — T466X5A Adverse effect of antihyperlipidemic and antiarteriosclerotic drugs, initial encounter: Secondary | ICD-10-CM

## 2022-03-31 DIAGNOSIS — G720 Drug-induced myopathy: Secondary | ICD-10-CM

## 2022-03-31 DIAGNOSIS — E1169 Type 2 diabetes mellitus with other specified complication: Secondary | ICD-10-CM

## 2022-03-31 DIAGNOSIS — I10 Essential (primary) hypertension: Secondary | ICD-10-CM | POA: Diagnosis not present

## 2022-03-31 DIAGNOSIS — E785 Hyperlipidemia, unspecified: Secondary | ICD-10-CM

## 2022-03-31 DIAGNOSIS — E119 Type 2 diabetes mellitus without complications: Secondary | ICD-10-CM

## 2022-03-31 MED ORDER — BLOOD GLUCOSE TEST VI STRP: 1.0000 | ORAL_STRIP | Freq: Every day | 3 refills | Status: AC

## 2022-03-31 MED ORDER — CHLORTHALIDONE 25 MG PO TABS: 12.5000 mg | ORAL_TABLET | Freq: Every day | ORAL | 0 refills | Status: DC

## 2022-03-31 MED ORDER — AMLODIPINE BESYLATE 10 MG PO TABS: 10.0000 mg | ORAL_TABLET | Freq: Every day | ORAL | 3 refills | Status: DC

## 2022-03-31 NOTE — Assessment & Plan Note (Signed)
Fair control/ borderline BP: (!) 141/80   Has worsening hyponatremia  Will try cutting chlorthalidone to 12.5 and f/u with lab 2 wk Inc amlodipine to 10 mg daily  Continue valsartan 320 mg daily  Would like to be in best control possible before urogyn surgery

## 2022-03-31 NOTE — Assessment & Plan Note (Signed)
Worse with na of 126 Disc risks of this No symptoms   Will cut chlorthalidone to 12.5 mg from 25  May have to watch fluids  Hesitant to use salt tabs with elevated bp F/u about 2 wk for visit and labs

## 2022-03-31 NOTE — Patient Instructions (Addendum)
Please read about zetia  Let us know if you want to try it   Cut your chlorthalidone in 1/2 and take a half pill daily  Go up on amlodipine from 5 to 10 mg    Follow up in 1-2 weeks for visit and labs   Take care of yourself  Keep up the good work

## 2022-03-31 NOTE — Assessment & Plan Note (Signed)
Not at goal  Disc goals for lipids and reasons to control them Rev last labs with pt Rev low sat fat diet in detail  Mild LDL imp with diet Intol of any /all doses of all statins-myopathy Enc her to consider zetia (she is nto sure) Handout given- she will do some research and let us know if interested

## 2022-03-31 NOTE — Progress Notes (Signed)
Subjective:    Patient ID: Jordan Patterson, female    DOB: 04-08-1940, 82 y.o.   MRN: [id number]  HPI Pt presents for f/u of DM2 and HTN and other chronic health problems   Wt Readings from Last 3 Encounters:  03/31/22 162 lb 6 oz (73.7 kg)  03/30/22 161 lb 12.8 oz (73.4 kg)  03/15/22 186 lb (84.4 kg)   26.21 kg/m  Vitals:   03/31/22 1053  BP: (!) 144/86  Pulse: 81  Temp: (!) 97.4 F (36.3 C)  SpO2: 97%   Has urogyn surgery planned Was cleared by cardiology   Has felt much better overall than the fall    HTN bp is stable today  No cp or palpitations or headaches or edema  No side effects to medicines  BP Readings from Last 3 Encounters:  03/31/22 (!) 141/80  03/30/22 (!) 186/96  02/26/22 (!) 163/84    Amlodipine 5 mg daily  Chlorthalidone 25 mg daily  Valsartan 320 mg daily   Pulse Readings from Last 3 Encounters:  03/31/22 81  03/30/22 83  02/26/22 96   Used to see Dr Smith-retired   Lab Results  Component Value Date   CREATININE 0.76 03/24/2022   BUN 10 03/24/2022   NA 126 (L) 03/24/2022   K 3.9 03/24/2022   CL 88 (L) 03/24/2022   CO2 28 03/24/2022   Watching na level  GFR is 73.48   DM2 Lab Results  Component Value Date   HGBA1C 6.6 (H) 03/24/2022  Down from 7.5 Much improved  Metformin 1000 mg bid  No diarrhea   She does check sugar but it is hard to get blood sometimes  No low readings  Eating healthy   Eye exam January  No diabetic damage   Lab Results  Component Value Date   MICROALBUR 10.3 (H) 03/24/2022   MICROALBUR 1.6 03/06/2019       Hyperlipidemia Lab Results  Component Value Date   CHOL 225 (H) 03/24/2022   CHOL 255 (H) 04/30/2021   CHOL 250 (H) 03/17/2020   Lab Results  Component Value Date   HDL 56.90 03/24/2022   HDL 68.50 04/30/2021   HDL 53.10 03/17/2020   Lab Results  Component Value Date   LDLCALC 142 (H) 03/24/2022   LDLCALC 163 (H) 04/30/2021   LDLCALC 156 (H) 03/05/2019   Lab Results   Component Value Date   TRIG 131.0 03/24/2022   TRIG 117.0 04/30/2021   TRIG 230.0 (H) 03/17/2020   Lab Results  Component Value Date   CHOLHDL 4 03/24/2022   CHOLHDL 4 04/30/2021   CHOLHDL 5 03/17/2020   Lab Results  Component Value Date   LDLDIRECT 164.0 03/17/2020   LDLDIRECT 169.0 02/27/2018   LDLDIRECT 187.0 02/09/2017  Entirely statin intolerant    Patient Active Problem List   Diagnosis Date Noted   Acute cystitis 02/11/2022   Frequent urination 02/09/2022   Constipation 12/14/2021   Preoperative cardiovascular examination 11/09/2021   Cough 11/04/2021   Digital mucinous cyst of finger 08/13/2021   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   Vaginal spotting 04/07/2021   Estrogen deficiency 04/07/2021   Thoracic compression fracture (HCC) 01/22/2021   Compression fracture of T11 vertebra (HCC) 12/15/2020   Current use of proton pump inhibitor 03/11/2020   Chronic midline low back pain without sciatica 03/12/2019   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   Hyponatremia 12/14/2018   Laryngopharyngeal reflux (LPR) 08/24/2017   Osteoporosis 08/24/2017   Leg cramping 08/21/2017   Regurgitation  of food    Prolapse of female pelvic organs 02/19/2017   History of compression fracture of spine 07/18/2014   Hypertensive retinopathy of both eyes 06/04/2014   Nonexudative age-related macular degeneration 06/04/2014   Posterior vitreous detachment of both eyes 06/04/2014   Pseudophakia of both eyes 06/04/2014   Right knee pain 05/30/2014   Advanced care planning/counseling discussion 01/24/2014   Health maintenance examination 01/24/2014   Dysgeusia 11/12/2013   Upper airway cough syndrome 03/19/2013   Allergic conjunctivitis and rhinitis 05/15/2012   Asthma, moderate persistent 03/09/2012   Medicare annual wellness visit, subsequent 01/14/2012   Hiatal hernia    GERD (gastroesophageal reflux disease)    Essential hypertension    Hyperlipidemia associated with type 2 diabetes mellitus  ([place])    Depression    Hypothyroidism    Diabetes type 2, controlled ([place])    Osteoarthritis    Past Medical History:  Diagnosis Date   Allergic rhinitis    Arthritis    hands and knees - ?osteo   Asthma    Chronic headaches    Compression fracture of T12 vertebra ([place]) 07/18/2014   S/p kyphoplasty by Dr Sherwood Gambler (08/2014)    Depression    pt denies   Diabetes type 2, controlled ([place]) 2004   Environmental allergies    dust,mold,mildew   Extrinsic asthma    GERD (gastroesophageal reflux disease)    Olevia Perches) EGD - mild esophageal dysmotility and small hiatal hernia   Hiatal hernia    Hx of migraines    Hyperlipidemia    mild, diet controlled   Hypertension    Hypothyroidism    Macular degeneration    Osteoporosis with fracture 07/2014   T -1.2 hip, T 0.2 spine, T12 compression fracture s/p kyphoplasty   Past Surgical History:  Procedure Laterality Date   80 HOUR [place] STUDY N/A 04/11/2017   Procedure: 70 HOUR PH STUDY;  Surgeon: Mauri Pole, MD;  Location: [facility];  Service: Endoscopy;  Laterality: N/A;   BACK SURGERY     CATARACT EXTRACTION  02/22/2001   bilateral with lens implant   COLONOSCOPY     DEXA  05/23/2008   improved (initial DEXA 2007 - mild osteopenia)   DILATION AND CURETTAGE OF UTERUS  02/23/1976   ESOPHAGEAL MANOMETRY N/A 04/11/2017   Procedure: ESOPHAGEAL MANOMETRY (EM);  Surgeon: Mauri Pole, MD;  Location: [facility];  Service: Endoscopy;  Laterality: N/A;   ESOPHAGOGASTRODUODENOSCOPY  06/25/2011   Procedure: ESOPHAGOGASTRODUODENOSCOPY (EGD);  Surgeon: Lafayette Dragon, MD;  Location: [facility];  Service: Endoscopy;  Laterality: N/A;  no Xray   EYE SURGERY     KYPHOPLASTY N/A 08/30/2014   Procedure: THORACIC TWELVE KYPHOPLASTY;  Surgeon: Jovita Gamma, MD   KYPHOPLASTY N/A 01/22/2021   Procedure: Thoracic eight, Thoracic nine KYPHOPLASTY;  Surgeon: Ashok Pall, MD;  Location: [facility];  Service: Neurosurgery;  Laterality: N/A;    LAPAROSCOPY ABDOMEN DIAGNOSTIC     To R/O endometriosis   PH IMPEDANCE STUDY N/A 04/11/2017   Procedure: [place] IMPEDANCE STUDY;  Surgeon: Mauri Pole, MD;  Location: [facility];  Service: Endoscopy;  Laterality: N/A;   SAVORY DILATION  06/25/2011   Procedure: SAVORY DILATION;  Surgeon: Lafayette Dragon, MD;  Location: [facility];  Service: Endoscopy;  Laterality: N/A;   TONSILLECTOMY AND ADENOIDECTOMY  02/23/1960   US ECHOCARDIOGRAPHY  25/85/2778   nl systolic function EF 24%, mild diastolic dysfunction   Social History   Tobacco Use   Smoking status: Never  Passive exposure: Never   Smokeless tobacco: Never  Vaping Use   Vaping Use: Never used  Substance Use Topics   Alcohol use: No    Alcohol/week: 0.0 standard drinks of alcohol   Drug use: No   Family History  Problem Relation Age of Onset   Mitral valve prolapse Father    Diabetes Father    Mitral valve prolapse Mother    Hypertension Mother    Stroke Mother 73       after valve surgery   Parkinson's disease Brother    Other Brother        POST WAR TRAUMA   Stroke Paternal Grandfather    Heart failure Maternal Grandfather    Cancer Neg Hx    Colon cancer Neg Hx    Stomach cancer Neg Hx    Esophageal cancer Neg Hx    Pancreatic cancer Neg Hx    Liver disease Neg Hx    Allergies  Allergen Reactions   Ace Inhibitors Cough   Crestor [Rosuvastatin] Other (See Comments)    myalgias   Wellbutrin [Bupropion Hcl] Other (See Comments)    Unknown reaction   Cymbalta [Duloxetine Hcl] Palpitations and Other (See Comments)    headaches   Lipitor [Atorvastatin] Cough   Pregabalin Other (See Comments)    Unknown reaction   Tegretol [Carbamazepine] Other (See Comments)    Dizziness, headache   Current Outpatient Medications on File Prior to Visit  Medication Sig Dispense Refill   Accu-Chek Softclix Lancets lancets Use as instructed 100 each 12   acetaminophen (TYLENOL) 500 MG tablet Take 1 tablet (500 mg total)  by mouth every 6 (six) hours as needed (pain). 30 tablet 0   albuterol (VENTOLIN HFA) 108 (90 Base) MCG/ACT inhaler TAKE 2 PUFFS BY MOUTH EVERY 6 HOURS AS NEEDED FOR WHEEZE OR SHORTNESS OF BREATH 18 each 2   aspirin 81 MG tablet Take 81 mg by mouth daily.     blood glucose meter kit and supplies Dispense based on patient and insurance preference. Use qd and prn for uncontrolled diabetes. (FOR ICD-10 E10.9, E11.9). 1 each 1   Budeson-Glycopyrrol-Formoterol (BREZTRI AEROSPHERE) 160-9-4.8 MCG/ACT AERO TAKE 2 PUFFS BY MOUTH TWICE A DAY 32.1 g 3   Calcium 250 MG CAPS Take 500 mg by mouth in the morning and at bedtime.     chlorpheniramine (CHLOR-TRIMETON) 4 MG tablet Take 4 mg by mouth in the morning, at noon, and at bedtime.     Cholecalciferol (VITAMIN D3) 1000 units CAPS Take 1 capsule (1,000 Units total) by mouth daily. 30 capsule    esomeprazole (NEXIUM) 40 MG capsule 1 capsule by mouth every day before breakfast 90 capsule 6   famotidine (PEPCID) 20 MG tablet TAKE 1 TABLET BY MOUTH EVERYDAY AT BEDTIME 90 tablet 6   ferrous sulfate 325 (65 FE) MG EC tablet Take 325 mg by mouth every Monday, Wednesday, and Friday.     Garlic (GARLIQUE PO) Take 1 tablet by mouth daily.     glucose blood test strip Use as instructed 100 each 12   ibuprofen (ADVIL) 600 MG tablet Take 1 tablet (600 mg total) by mouth every 6 (six) hours as needed. 30 tablet 0   levothyroxine (SYNTHROID) 100 MCG tablet TAKE 1 TABLET BY MOUTH EVERY DAY BEFORE BREAKFAST 90 tablet 2   MAGNESIUM PO Take 2 tablets by mouth daily.     metFORMIN (GLUCOPHAGE) 1000 MG tablet Take 1 tablet (1,000 mg total) by mouth 2 (two) times  daily with a meal. 180 tablet 3   methocarbamol (ROBAXIN-750) 750 MG tablet Take 1 tablet (750 mg total) by mouth 4 (four) times daily. 20 tablet 0   montelukast (SINGULAIR) 10 MG tablet TAKE 1 TABLET BY MOUTH EVERYDAY AT BEDTIME 90 tablet 4   Multiple Vitamins-Minerals (VISION HEALTH PO) Take by mouth.     Nutritional  Supplements (GLUCOSE MANAGEMENT PO) Take by mouth.     nystatin-triamcinolone (MYCOLOG II) cream SMARTSIG:By Mouth     Omega-3 Fatty Acids (OMEGA 3 500 PO) Take 1 capsule by mouth in the morning and at bedtime.     polyethylene glycol (MIRALAX / GLYCOLAX) packet Take 17 g by mouth 2 (two) times a week.     polyethylene glycol powder (GLYCOLAX/MIRALAX) 17 GM/SCOOP powder Take 17 g by mouth daily. Drink 17g (1 scoop) dissolved in water per day. 255 g 0   Polyvinyl Alcohol-Povidone (REFRESH OP) Place 1 drop into both eyes daily.      Specialty Vitamins Products (CARDIOVASCULAR SUPPORT PO) Take 2 capsules by mouth daily.     valsartan (DIOVAN) 320 MG tablet TAKE 1 TABLET BY MOUTH EVERY DAY 90 tablet 3   No current facility-administered medications on file prior to visit.      Review of Systems  Constitutional:  Negative for activity change, appetite change, fatigue, fever and unexpected weight change.  HENT:  Negative for congestion, ear pain, rhinorrhea, sinus pressure and sore throat.   Eyes:  Negative for pain, redness and visual disturbance.  Respiratory:  Negative for cough, shortness of breath and wheezing.   Cardiovascular:  Negative for chest pain and palpitations.  Gastrointestinal:  Negative for abdominal pain, blood in stool, constipation and diarrhea.  Endocrine: Negative for polydipsia and polyuria.  Genitourinary:  Negative for dysuria, frequency and urgency.       Urinary incontinence   Musculoskeletal:  Negative for arthralgias, back pain and myalgias.  Skin:  Negative for pallor and rash.  Allergic/Immunologic: Negative for environmental allergies.  Neurological:  Negative for dizziness, syncope and headaches.  Hematological:  Negative for adenopathy. Does not bruise/bleed easily.  Psychiatric/Behavioral:  Negative for decreased concentration and dysphoric mood. The patient is not nervous/anxious.        Objective:   Physical Exam Constitutional:      General: She is  not in acute distress.    Appearance: Normal appearance. She is well-developed and normal weight. She is not ill-appearing or diaphoretic.  HENT:     Head: Normocephalic and atraumatic.  Eyes:     Conjunctiva/sclera: Conjunctivae normal.     Pupils: Pupils are equal, round, and reactive to light.  Neck:     Thyroid: No thyromegaly.     Vascular: No carotid bruit or JVD.  Cardiovascular:     Rate and Rhythm: Normal rate and regular rhythm.     Heart sounds: Normal heart sounds.     No gallop.  Pulmonary:     Effort: Pulmonary effort is normal. No respiratory distress.     Breath sounds: Normal breath sounds. No wheezing or rales.  Abdominal:     General: There is no distension or abdominal bruit.     Palpations: Abdomen is soft.  Musculoskeletal:     Cervical back: Normal range of motion and neck supple.     Right lower leg: No edema.     Left lower leg: No edema.     Comments: Kyphosis   Lymphadenopathy:     Cervical: No cervical adenopathy.  Skin:    General: Skin is warm and dry.     Coloration: Skin is not pale.     Findings: No rash.  Neurological:     Mental Status: She is alert.     Coordination: Coordination normal.     Deep Tendon Reflexes: Reflexes are normal and symmetric. Reflexes normal.  Psychiatric:        Mood and Affect: Mood normal.           Assessment & Plan:   Problem List Items Addressed This Visit       Cardiovascular and Mediastinum   Essential hypertension    Fair control/ borderline BP: (!) 141/80   Has worsening hyponatremia  Will try cutting chlorthalidone to 12.5 and f/u with lab 2 wk Inc amlodipine to 10 mg daily  Continue valsartan 320 mg daily  Would like to be in best control possible before urogyn surgery      Relevant Medications   amLODipine (NORVASC) 10 MG tablet   chlorthalidone (HYGROTON) 25 MG tablet     Endocrine   Diabetes type 2, controlled (HCC) - Primary    Lab Results  Component Value Date   HGBA1C 6.6  (H) 03/24/2022  This is improved Tolerating higher dose of metformin 1000 mg bid Stable labs Eye exam last mo Cannot take statin  On arb  Eating better Enc to continue low glycemic diet       Hyperlipidemia associated with type 2 diabetes mellitus (HCC)    Not at goal  Disc goals for lipids and reasons to control them Rev last labs with pt Rev low sat fat diet in detail  Mild LDL imp with diet Intol of any /all doses of all statins-myopathy Enc her to consider zetia (she is nto sure) Handout given- she will do some research and let us know if interested       Relevant Medications   amLODipine (NORVASC) 10 MG tablet   chlorthalidone (HYGROTON) 25 MG tablet     Musculoskeletal and Integument   Statin myopathy    Unable to take any/all statins        Other   Hyponatremia    Worse with na of 126 Disc risks of this No symptoms   Will cut chlorthalidone to 12.5 mg from 25  May have to watch fluids  Hesitant to use salt tabs with elevated bp F/u about 2 wk for visit and labs

## 2022-03-31 NOTE — Assessment & Plan Note (Signed)
Lab Results  Component Value Date   HGBA1C 6.6 (H) 03/24/2022   This is improved Tolerating higher dose of metformin 1000 mg bid Stable labs Eye exam last mo Cannot take statin  On arb  Eating better Enc to continue low glycemic diet

## 2022-03-31 NOTE — Assessment & Plan Note (Signed)
Unable to take any/all statins

## 2022-04-01 ENCOUNTER — Ambulatory Visit: Payer: Medicare PPO | Admitting: Nurse Practitioner

## 2022-04-05 ENCOUNTER — Ambulatory Visit (INDEPENDENT_AMBULATORY_CARE_PROVIDER_SITE_OTHER): Payer: Medicare PPO

## 2022-04-05 ENCOUNTER — Encounter: Payer: Self-pay | Admitting: Nurse Practitioner

## 2022-04-05 ENCOUNTER — Ambulatory Visit: Payer: Medicare PPO | Admitting: Nurse Practitioner

## 2022-04-05 VITALS — BP 126/80 | HR 87 | Ht 67.0 in | Wt 162.0 lb

## 2022-04-05 DIAGNOSIS — Z01811 Encounter for preprocedural respiratory examination: Secondary | ICD-10-CM

## 2022-04-05 DIAGNOSIS — J4540 Moderate persistent asthma, uncomplicated: Secondary | ICD-10-CM

## 2022-04-05 DIAGNOSIS — R058 Other specified cough: Secondary | ICD-10-CM | POA: Diagnosis not present

## 2022-04-05 DIAGNOSIS — Z01818 Encounter for other preprocedural examination: Secondary | ICD-10-CM | POA: Diagnosis not present

## 2022-04-05 MED ORDER — BENZONATATE 200 MG PO CAPS: 200.0000 mg | ORAL_CAPSULE | Freq: Three times a day (TID) | ORAL | 1 refills | Status: DC | PRN

## 2022-04-05 NOTE — Assessment & Plan Note (Signed)
Stable at this time. She is concerned about cough in the postop/recovery phase. Provided her with rx for benzonatate to use as needed to control cough. She will continue her current regimen for asthma/cough control.

## 2022-04-05 NOTE — Progress Notes (Signed)
$@Patientr$  ID: Jordan Patterson, female    DOB: [birth year], 82 y.o.   MRN: [id number]  Chief Complaint  Patient presents with   Follow-up    Pt surgical clearance, having surgery for uterine prolapse scheduled for 2/26. Pt states that her breathing is otherwise fine besides the chronic cough, she states that mucinex has helped her     Referring provider: Tower, Wynelle Fanny, MD  HPI: 82 year old female, never smoker followed for asthma and upper airway cough. She is a patient of Dr. Janee Morn and last seen in office 12/01/2021. Past medical history significant for HTN, GERD, HLD, hypothyroid, DM II, OA, depression.   TEST/EVENTS:  12/29/2015 FeNO 37 ppb 12/29/2015 spirometry: FVC 60%, FEV1 51%, ratio 0.65. moderately severe obstructive disease 07/2017 allergy profile: elevated dust mites, cat, pecan/hickory pollen; total IgE 16  11/17/2021 CXR: lungs clear  12/01/2021: OV with Dr. Annamaria Boots. Treated 10/2021 for asthma exacerbation with prednisone and doxycycline. She is still SOB on exertion but doing better. Baseline dry cough for many years now, probably multifactorial with upper airway cough syndrome, asthma, postnasal drip, and maybe some reflux. Has improved gradually since treatment by NP on 9/21, but not quite back to her normal. Plan to extend prednisone to help calm airways further. Discuss timing of surgery for uterine prolapse.  04/05/2022: Today - surgical clearance Patient presents today for surgical clearance for repair of her uterine prolapse with Dr. Wannetta Sender, scheduled for 2/26. She has been doing well since she was here last. She has not required any further rounds of abx or steroids for her breathing. She feels like she can complete activities without any difficulty. She does still have an occasional cough, which is baseline for her. Denies increased congestion, fevers, chills, wheezing. She is using her Breztri twice daily. Hasn't had to use albuterol recently. She is worried about her cough  in the postoperative setting. She's scared if she gets into a coughing fit that it will hurt her stitches.   Allergies  Allergen Reactions   Ace Inhibitors Cough   Crestor [Rosuvastatin] Other (See Comments)    myalgias   Wellbutrin [Bupropion Hcl] Other (See Comments)    Unknown reaction   Cymbalta [Duloxetine Hcl] Palpitations and Other (See Comments)    headaches   Lipitor [Atorvastatin] Cough   Pregabalin Other (See Comments)    Unknown reaction   Tegretol [Carbamazepine] Other (See Comments)    Dizziness, headache    Immunization History  Administered Date(s) Administered   Fluad Quad(high Dose 65+) 12/08/2019, 12/15/2020, 12/14/2021   Influenza Whole 11/23/2011, 12/06/2012   Influenza, High Dose Seasonal PF 11/23/2018, 12/08/2019   Influenza,inj,Quad PF,6+ Mos 11/06/2014, 10/21/2015, 11/23/2016, 11/23/2017, 11/09/2018   Influenza-Unspecified 11/22/2013   PFIZER Comirnaty(Gray Top)Covid-19 Tri-Sucrose Vaccine 06/01/2019, 06/27/2019, 03/30/2020   PFIZER(Purple Top)SARS-COV-2 Vaccination 06/01/2019, 06/27/2019, 03/30/2020   Pneumococcal Conjugate-13 05/29/2015   Pneumococcal Polysaccharide-23 10/23/2005   Respiratory Syncytial Virus Vaccine,Recomb Aduvanted(Arexvy) 02/24/2022   Td 01/14/2012    Past Medical History:  Diagnosis Date   Allergic rhinitis    Arthritis    hands and knees - ?osteo   Asthma    Chronic headaches    Compression fracture of T12 vertebra ([place]) 07/18/2014   S/p kyphoplasty by Dr Sherwood Gambler (08/2014)    Depression    pt denies   Diabetes type 2, controlled ([place]) 2004   Environmental allergies    dust,mold,mildew   Extrinsic asthma    GERD (gastroesophageal reflux disease)    Olevia Perches) EGD - mild esophageal  dysmotility and small hiatal hernia   Hiatal hernia    Hx of migraines    Hyperlipidemia    mild, diet controlled   Hypertension    Hypothyroidism    Macular degeneration    Osteoporosis with fracture 07/2014   T -1.2 hip, T 0.2 spine, T12  compression fracture s/p kyphoplasty    Tobacco History: Social History   Tobacco Use  Smoking Status Never   Passive exposure: Never  Smokeless Tobacco Never   Counseling given: Not Answered   Outpatient Medications Prior to Visit  Medication Sig Dispense Refill   Accu-Chek Softclix Lancets lancets Use as instructed 100 each 12   acetaminophen (TYLENOL) 500 MG tablet Take 1 tablet (500 mg total) by mouth every 6 (six) hours as needed (pain). 30 tablet 0   albuterol (VENTOLIN HFA) 108 (90 Base) MCG/ACT inhaler TAKE 2 PUFFS BY MOUTH EVERY 6 HOURS AS NEEDED FOR WHEEZE OR SHORTNESS OF BREATH 18 each 2   amLODipine (NORVASC) 10 MG tablet Take 1 tablet (10 mg total) by mouth daily. 90 tablet 3   aspirin 81 MG tablet Take 81 mg by mouth daily.     blood glucose meter kit and supplies Dispense based on patient and insurance preference. Use qd and prn for uncontrolled diabetes. (FOR ICD-10 E10.9, E11.9). 1 each 1   Budeson-Glycopyrrol-Formoterol (BREZTRI AEROSPHERE) 160-9-4.8 MCG/ACT AERO TAKE 2 PUFFS BY MOUTH TWICE A DAY 32.1 g 3   Calcium 250 MG CAPS Take 500 mg by mouth in the morning and at bedtime.     chlorpheniramine (CHLOR-TRIMETON) 4 MG tablet Take 4 mg by mouth in the morning, at noon, and at bedtime.     chlorthalidone (HYGROTON) 25 MG tablet Take 0.5 tablets (12.5 mg total) by mouth daily. 1 tablet 0   Cholecalciferol (VITAMIN D3) 1000 units CAPS Take 1 capsule (1,000 Units total) by mouth daily. 30 capsule    esomeprazole (NEXIUM) 40 MG capsule 1 capsule by mouth every day before breakfast 90 capsule 6   famotidine (PEPCID) 20 MG tablet TAKE 1 TABLET BY MOUTH EVERYDAY AT BEDTIME 90 tablet 6   ferrous sulfate 325 (65 FE) MG EC tablet Take 325 mg by mouth every Monday, Wednesday, and Friday.     Garlic (GARLIQUE PO) Take 1 tablet by mouth daily.     Glucose Blood (BLOOD GLUCOSE TEST STRIPS) STRP 1 each by In Vitro route daily. May substitute to any manufacturer covered by patient's  insurance.  For diabetes type 2 100 strip 3   glucose blood test strip Use as instructed 100 each 12   ibuprofen (ADVIL) 600 MG tablet Take 1 tablet (600 mg total) by mouth every 6 (six) hours as needed. 30 tablet 0   levothyroxine (SYNTHROID) 100 MCG tablet TAKE 1 TABLET BY MOUTH EVERY DAY BEFORE BREAKFAST 90 tablet 2   MAGNESIUM PO Take 2 tablets by mouth daily.     metFORMIN (GLUCOPHAGE) 1000 MG tablet Take 1 tablet (1,000 mg total) by mouth 2 (two) times daily with a meal. 180 tablet 3   methocarbamol (ROBAXIN-750) 750 MG tablet Take 1 tablet (750 mg total) by mouth 4 (four) times daily. 20 tablet 0   montelukast (SINGULAIR) 10 MG tablet TAKE 1 TABLET BY MOUTH EVERYDAY AT BEDTIME 90 tablet 4   Multiple Vitamins-Minerals (VISION HEALTH PO) Take by mouth.     Nutritional Supplements (GLUCOSE MANAGEMENT PO) Take by mouth.     nystatin-triamcinolone (MYCOLOG II) cream SMARTSIG:By Mouth  Omega-3 Fatty Acids (OMEGA 3 500 PO) Take 1 capsule by mouth in the morning and at bedtime.     polyethylene glycol (MIRALAX / GLYCOLAX) packet Take 17 g by mouth 2 (two) times a week.     polyethylene glycol powder (GLYCOLAX/MIRALAX) 17 GM/SCOOP powder Take 17 g by mouth daily. Drink 17g (1 scoop) dissolved in water per day. 255 g 0   Polyvinyl Alcohol-Povidone (REFRESH OP) Place 1 drop into both eyes daily.      Specialty Vitamins Products (CARDIOVASCULAR SUPPORT PO) Take 2 capsules by mouth daily.     valsartan (DIOVAN) 320 MG tablet TAKE 1 TABLET BY MOUTH EVERY DAY 90 tablet 3   No facility-administered medications prior to visit.     Review of Systems:   Constitutional: No weight loss or gain, night sweats, fevers, chills, fatigue, or lassitude. HEENT: No headaches, difficulty swallowing, tooth/dental problems, or sore throat. No sneezing, itching, ear ache, nasal congestion. +post nasal drip (baseline; stable) CV:  No chest pain, orthopnea, PND, swelling in lower extremities, anasarca, dizziness,  palpitations, syncope Resp: +occasional cough. No shortness of breath with exertion or at rest. No excess mucus or change in color of mucus. No hemoptysis. No wheezing.  No chest wall deformity GI:  No heartburn, indigestion, abdominal pain, nausea, vomiting, diarrhea, loss of appetite Skin: No rash, lesions, ulcerations MSK:  No joint pain or swelling.   Neuro: No dizziness or lightheadedness.  Psych: No depression or anxiety. Mood stable.     Physical Exam:  BP 126/80   Pulse 87   Ht 5' 7"$  (1.702 m)   Wt 162 lb (73.5 kg)   SpO2 97%   BMI 25.37 kg/m   GEN: Pleasant, interactive, well-appearing; in no acute distress. HEENT:  Normocephalic and atraumatic. PERRLA. Sclera white. Nasal turbinates pink, moist and patent bilaterally. No rhinorrhea present. Oropharynx pink and moist, without exudate or edema. No lesions, ulcerations, or postnasal drip.  NECK:  Supple w/ fair ROM. No JVD present. Normal carotid impulses w/o bruits. Thyroid symmetrical with no goiter or nodules palpated. No lymphadenopathy.   CV: RRR, no m/r/g, no peripheral edema. Pulses intact, +2 bilaterally. No cyanosis, pallor or clubbing. PULMONARY:  Unlabored, regular breathing. Clear bilaterally A&P w/o wheezes/rales/rhonchi. No accessory muscle use.  GI: BS present and normoactive. Soft, non-tender to palpation. No organomegaly or masses detected.  MSK: No erythema, warmth or tenderness. Cap refil <2 sec all extrem. No deformities or joint swelling noted.  Neuro: A/Ox3. No focal deficits noted.   Skin: Warm, no lesions or rashe Psych: Normal affect and behavior. Judgement and thought content appropriate.     Lab Results:  CBC    Component Value Date/Time   WBC 6.4 04/30/2021 0758   RBC 4.36 04/30/2021 0758   HGB 13.4 04/30/2021 0758   HCT 39.2 04/30/2021 0758   PLT 200.0 04/30/2021 0758   MCV 89.9 04/30/2021 0758   MCH 30.1 01/20/2021 1329   MCHC 34.2 04/30/2021 0758   RDW 13.2 04/30/2021 0758    LYMPHSABS 2.7 04/30/2021 0758   MONOABS 0.4 04/30/2021 0758   EOSABS 0.1 04/30/2021 0758   BASOSABS 0.1 04/30/2021 0758    BMET    Component Value Date/Time   NA 126 (L) 03/24/2022 0757   NA 133 (L) 12/10/2020 1308   K 3.9 03/24/2022 0757   CL 88 (L) 03/24/2022 0757   CO2 28 03/24/2022 0757   GLUCOSE 134 (H) 03/24/2022 0757   BUN 10 03/24/2022 0757   BUN  12 12/10/2020 1308   CREATININE 0.76 03/24/2022 0757   CREATININE 0.96 04/21/2011 0000   CALCIUM 9.2 03/24/2022 0757   GFRNONAA >60 01/20/2021 1329   GFRAA 88 01/12/2019 1213    BNP No results found for: "BNP"   Imaging:  No results found.        No data to display          Lab Results  Component Value Date   NITRICOXIDE 25 08/17/2017        Assessment & Plan:   Asthma, moderate persistent Compensated on current regimen. No exacerbations requiring steroids or abx since September/October. She will continue triple therapy regimen. Asthma action plan in place.   Patient Instructions  Continue Albuterol inhaler 2 puffs every 6 hours as needed for shortness of breath or wheezing. Notify if symptoms persist despite rescue inhaler/neb use. Continue Breztri 2 puffs Twice daily. Brush tongue and rinse mouth afterwards Continue Singulair 10 mg At bedtime  Continue nexium 1 capsule before breakfast Continue famotidine 1 tablet At bedtime   Benzonatate 1 capsule Three times a day as needed for cough in the recovery period  Take your inhalers with your to surgery. Use your Breztri morning of surgery. Use incentive spirometer 10 times an hour while awake during the recovery period. Out of bed as soon as possible after surgery  Chest x ray today  Follow up in April with Dr. Annamaria Boots as scheduled. If symptoms worsen, please contact office for sooner follow up or seek emergency care.    Preoperative respiratory examination Moderate risk. Factors that increase the risk for postoperative pulmonary complications are  age, asthma  Respiratory complications generally occur in 1% of ASA Class I patients, 5% of ASA Class II and 10% of ASA Class III-IV patients These complications rarely result in mortality and include postoperative pneumonia, atelectasis, pulmonary embolism, ARDS and increased time requiring postoperative mechanical ventilation.   Overall, I recommend proceeding with the surgery if the risk for respiratory complications are outweighed by the potential benefits. This will need to be discussed between the patient and surgeon.   To reduce risks of respiratory complications, I recommend: --Pre- and post-operative incentive spirometry performed frequently while awake --Inpatient use of currently prescribed bronchodilators --Short duration of surgery as much as possible and avoid paralytic if possible --OOB, encourage mobility post-op   1) RISK FOR PROLONGED MECHANICAL VENTILAION - > 48h  1A) Arozullah - Prolonged mech ventilation risk Arozullah Postperative Pulmonary Risk Score - for mech ventilation dependence >48h [employer], [facility] 2000, major non-cardiac surgery) Comment Score  Type of surgery - abd ao aneurysm (27), thoracic (21), neurosurgery / upper abdominal / vascular (21), neck (11) Uterine prolapse repair/reconstruction 5  Emergency Surgery - (11)    ALbumin < 3 or poor nutritional state - (9)    BUN > 30 -  (8)    Partial or completely dependent functional status - (7)    COPD -  (6) asthma 6  Age - 60 to 69 (4), > 70  (6) 81 6  TOTAL  17  Risk Stratifcation scores  - < 10 (0.5%), 11-19 (1.8%), 20-27 (4.2%), 28-40 (10.1%), >40 (26.6%)  1.8%    Upper airway cough syndrome Stable at this time. She is concerned about cough in the postop/recovery phase. Provided her with rx for benzonatate to use as needed to control cough. She will continue her current regimen for asthma/cough control.   I spent 35 minutes of dedicated to the care of  this patient on the date of this  encounter to include pre-visit review of records, face-to-face time with the patient discussing conditions above, post visit ordering of testing, clinical documentation with the electronic health record, making appropriate referrals as documented, and communicating necessary findings to members of the patients care team.  Clayton Bibles, NP 04/05/2022  Pt aware and understands NP's role.

## 2022-04-05 NOTE — Assessment & Plan Note (Signed)
Moderate risk. Factors that increase the risk for postoperative pulmonary complications are age, asthma  Respiratory complications generally occur in 1% of ASA Class I patients, 5% of ASA Class II and 10% of ASA Class III-IV patients These complications rarely result in mortality and include postoperative pneumonia, atelectasis, pulmonary embolism, ARDS and increased time requiring postoperative mechanical ventilation.   Overall, I recommend proceeding with the surgery if the risk for respiratory complications are outweighed by the potential benefits. This will need to be discussed between the patient and surgeon.   To reduce risks of respiratory complications, I recommend: --Pre- and post-operative incentive spirometry performed frequently while awake --Inpatient use of currently prescribed bronchodilators --Short duration of surgery as much as possible and avoid paralytic if possible --OOB, encourage mobility post-op   1) RISK FOR PROLONGED MECHANICAL VENTILAION - > 48h  1A) Arozullah - Prolonged mech ventilation risk Arozullah Postperative Pulmonary Risk Score - for mech ventilation dependence >48h [employer], Ann Surg 2000, major non-cardiac surgery) Comment Score  Type of surgery - abd ao aneurysm (27), thoracic (21), neurosurgery / upper abdominal / vascular (21), neck (11) Uterine prolapse repair/reconstruction 5  Emergency Surgery - (11)    ALbumin < 3 or poor nutritional state - (9)    BUN > 30 -  (8)    Partial or completely dependent functional status - (7)    COPD -  (6) asthma 6  Age - 60 to 69 (4), > 70  (6) 81 6  TOTAL  17  Risk Stratifcation scores  - < 10 (0.5%), 11-19 (1.8%), 20-27 (4.2%), 28-40 (10.1%), >40 (26.6%)  1.8%

## 2022-04-05 NOTE — Assessment & Plan Note (Signed)
Compensated on current regimen. No exacerbations requiring steroids or abx since September/October. She will continue triple therapy regimen. Asthma action plan in place.   Patient Instructions  Continue Albuterol inhaler 2 puffs every 6 hours as needed for shortness of breath or wheezing. Notify if symptoms persist despite rescue inhaler/neb use. Continue Breztri 2 puffs Twice daily. Brush tongue and rinse mouth afterwards Continue Singulair 10 mg At bedtime  Continue nexium 1 capsule before breakfast Continue famotidine 1 tablet At bedtime   Benzonatate 1 capsule Three times a day as needed for cough in the recovery period  Take your inhalers with your to surgery. Use your Breztri morning of surgery. Use incentive spirometer 10 times an hour while awake during the recovery period. Out of bed as soon as possible after surgery  Chest x ray today  Follow up in April with Dr. Annamaria Boots as scheduled. If symptoms worsen, please contact office for sooner follow up or seek emergency care.

## 2022-04-05 NOTE — Patient Instructions (Addendum)
Continue Albuterol inhaler 2 puffs every 6 hours as needed for shortness of breath or wheezing. Notify if symptoms persist despite rescue inhaler/neb use. Continue Breztri 2 puffs Twice daily. Brush tongue and rinse mouth afterwards Continue Singulair 10 mg At bedtime  Continue nexium 1 capsule before breakfast Continue famotidine 1 tablet At bedtime   Benzonatate 1 capsule Three times a day as needed for cough in the recovery period  Take your inhalers with your to surgery. Use your Breztri morning of surgery. Use incentive spirometer 10 times an hour while awake during the recovery period. Out of bed as soon as possible after surgery  Chest x ray today  Follow up in April with Dr. Annamaria Boots as scheduled. If symptoms worsen, please contact office for sooner follow up or seek emergency care.

## 2022-04-07 NOTE — Telephone Encounter (Signed)
OV notes and clearance form have been faxed back to [facility]. Nothing further needed at this time.

## 2022-04-12 ENCOUNTER — Encounter: Payer: Self-pay | Admitting: *Deleted

## 2022-04-12 DIAGNOSIS — S233XXA Sprain of ligaments of thoracic spine, initial encounter: Secondary | ICD-10-CM | POA: Diagnosis not present

## 2022-04-12 DIAGNOSIS — M9902 Segmental and somatic dysfunction of thoracic region: Secondary | ICD-10-CM | POA: Diagnosis not present

## 2022-04-14 ENCOUNTER — Ambulatory Visit: Payer: Medicare PPO | Admitting: Family Medicine

## 2022-04-14 ENCOUNTER — Encounter: Payer: Self-pay | Admitting: Family Medicine

## 2022-04-14 VITALS — BP 128/70 | HR 89 | Temp 97.6°F | Ht 67.0 in | Wt 161.0 lb

## 2022-04-14 DIAGNOSIS — E785 Hyperlipidemia, unspecified: Secondary | ICD-10-CM

## 2022-04-14 DIAGNOSIS — E871 Hypo-osmolality and hyponatremia: Secondary | ICD-10-CM

## 2022-04-14 DIAGNOSIS — I10 Essential (primary) hypertension: Secondary | ICD-10-CM | POA: Diagnosis not present

## 2022-04-14 DIAGNOSIS — E1169 Type 2 diabetes mellitus with other specified complication: Secondary | ICD-10-CM | POA: Diagnosis not present

## 2022-04-14 LAB — BASIC METABOLIC PANEL
BUN: 19 mg/dL (ref 6–23)
CO2: 28 mEq/L (ref 19–32)
Calcium: 10 mg/dL (ref 8.4–10.5)
Chloride: 95 mEq/L — ABNORMAL LOW (ref 96–112)
Creatinine, Ser: 0.83 mg/dL (ref 0.40–1.20)
GFR: 66.08 mL/min (ref >60.00)
Glucose, Bld: 99 mg/dL (ref 70–99)
Potassium: 4.2 mEq/L (ref 3.5–5.1)
Sodium: 135 mEq/L (ref 135–145)

## 2022-04-14 NOTE — Assessment & Plan Note (Signed)
Last na was 126 No symptoms   We cut her chlorthalidone to 12.5 mg (from 25) Re check bmet today   If not improved may have to d/c that  Bp is in good control

## 2022-04-14 NOTE — Progress Notes (Signed)
Subjective:    Patient ID: Jordan Patterson, female    DOB: 1940/07/30, 82 y.o.   MRN: [id number]  HPI Pt presents for f/u of HTN Cholesterol   Wt Readings from Last 3 Encounters:  04/14/22 161 lb (73 kg)  04/05/22 162 lb (73.5 kg)  03/31/22 162 lb 6 oz (73.7 kg)   25.22 kg/m Vitals:   04/14/22 1054  BP: 128/70  Pulse: 89  Temp: 97.6 F (36.4 C)  SpO2: 96%   BP Readings from Last 3 Encounters:  04/14/22 128/70  04/05/22 126/80  03/31/22 (!) 141/80   Pulse Readings from Last 3 Encounters:  04/14/22 89  04/05/22 87  03/31/22 81   So far better with the medicine changes  No ankle swelling   Last visit we cut the chlorthalidone to 12.5 mg because of hyponatremia  Increased amlodipine to 10 mg aily  Continued valsartan at 320 mg daily   Lab Results  Component Value Date   CREATININE 0.76 03/24/2022   BUN 10 03/24/2022   NA 126 (L) 03/24/2022   K 3.9 03/24/2022   CL 88 (L) 03/24/2022   CO2 28 03/24/2022   Hyperlipidemia Lab Results  Component Value Date   CHOL 225 (H) 03/24/2022   HDL 56.90 03/24/2022   LDLCALC 142 (H) 03/24/2022   LDLDIRECT 164.0 03/17/2020   TRIG 131.0 03/24/2022   CHOLHDL 4 03/24/2022   Is open to zetia after reading about it  Wants to wait until after she recovers from gyn surgery   Patient Active Problem List   Diagnosis Date Noted   Preoperative respiratory examination 04/05/2022   Acute cystitis 02/11/2022   Frequent urination 02/09/2022   Constipation 12/14/2021   Preoperative cardiovascular examination 11/09/2021   Cough 11/04/2021   Digital mucinous cyst of finger 08/13/2021   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   Vaginal spotting 04/07/2021   Estrogen deficiency 04/07/2021   Thoracic compression fracture ([place]) 01/22/2021   Compression fracture of T11 vertebra ([place]) 12/15/2020   Current use of proton pump inhibitor 03/11/2020   Chronic midline low back pain without sciatica 03/12/2019   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    Hyponatremia 12/14/2018   Laryngopharyngeal reflux (LPR) 08/24/2017   Osteoporosis 08/24/2017   Leg cramping 08/21/2017   Regurgitation of food    Prolapse of female pelvic organs 02/19/2017   History of compression fracture of spine 07/18/2014   Hypertensive retinopathy of both eyes 06/04/2014   Nonexudative age-related macular degeneration 06/04/2014   Posterior vitreous detachment of both eyes 06/04/2014   Pseudophakia of both eyes 06/04/2014   Right knee pain 05/30/2014   Advanced care planning/counseling discussion 01/24/2014   Health maintenance examination 01/24/2014   Dysgeusia 11/12/2013   Upper airway cough syndrome 03/19/2013   Allergic conjunctivitis and rhinitis 05/15/2012   Asthma, moderate persistent 03/09/2012   Medicare annual wellness visit, subsequent 01/14/2012   Hiatal hernia    GERD (gastroesophageal reflux disease)    Essential hypertension    Hyperlipidemia associated with type 2 diabetes mellitus ([place])    Depression    Hypothyroidism    Diabetes type 2, controlled ([place])    Osteoarthritis    Past Medical History:  Diagnosis Date   Allergic rhinitis    Arthritis    hands and knees - ?osteo   Asthma    Chronic headaches    Compression fracture of T12 vertebra ([place]) 07/18/2014   S/p kyphoplasty by Dr Sherwood Gambler (08/2014)    Depression    pt denies   Diabetes  type 2, controlled ([place]) 2004   Environmental allergies    dust,mold,mildew   Extrinsic asthma    GERD (gastroesophageal reflux disease)    Olevia Perches) EGD - mild esophageal dysmotility and small hiatal hernia   Hiatal hernia    Hx of migraines    Hyperlipidemia    mild, diet controlled   Hypertension    Hypothyroidism    Macular degeneration    Osteoporosis with fracture 07/2014   T -1.2 hip, T 0.2 spine, T12 compression fracture s/p kyphoplasty   Past Surgical History:  Procedure Laterality Date   33 HOUR [place] STUDY N/A 04/11/2017   Procedure: 24 HOUR PH STUDY;  Surgeon: Mauri Pole,  MD;  Location: [facility];  Service: Endoscopy;  Laterality: N/A;   BACK SURGERY     CATARACT EXTRACTION  02/22/2001   bilateral with lens implant   COLONOSCOPY     DEXA  05/23/2008   improved (initial DEXA 2007 - mild osteopenia)   DILATION AND CURETTAGE OF UTERUS  02/23/1976   ESOPHAGEAL MANOMETRY N/A 04/11/2017   Procedure: ESOPHAGEAL MANOMETRY (EM);  Surgeon: Mauri Pole, MD;  Location: [facility];  Service: Endoscopy;  Laterality: N/A;   ESOPHAGOGASTRODUODENOSCOPY  06/25/2011   Procedure: ESOPHAGOGASTRODUODENOSCOPY (EGD);  Surgeon: Lafayette Dragon, MD;  Location: [facility];  Service: Endoscopy;  Laterality: N/A;  no Xray   EYE SURGERY     KYPHOPLASTY N/A 08/30/2014   Procedure: THORACIC TWELVE KYPHOPLASTY;  Surgeon: Jovita Gamma, MD   KYPHOPLASTY N/A 01/22/2021   Procedure: Thoracic eight, Thoracic nine KYPHOPLASTY;  Surgeon: Ashok Pall, MD;  Location: [facility];  Service: Neurosurgery;  Laterality: N/A;   LAPAROSCOPY ABDOMEN DIAGNOSTIC     To R/O endometriosis   PH IMPEDANCE STUDY N/A 04/11/2017   Procedure: [place] IMPEDANCE STUDY;  Surgeon: Mauri Pole, MD;  Location: [facility];  Service: Endoscopy;  Laterality: N/A;   SAVORY DILATION  06/25/2011   Procedure: SAVORY DILATION;  Surgeon: Lafayette Dragon, MD;  Location: [facility];  Service: Endoscopy;  Laterality: N/A;   TONSILLECTOMY AND ADENOIDECTOMY  02/23/1960   US ECHOCARDIOGRAPHY  0000000   nl systolic function EF [id number], mild diastolic dysfunction   Social History   Tobacco Use   Smoking status: Never    Passive exposure: Never   Smokeless tobacco: Never  Vaping Use   Vaping Use: Never used  Substance Use Topics   Alcohol use: No    Alcohol/week: 0.0 standard drinks of alcohol   Drug use: No   Family History  Problem Relation Age of Onset   Mitral valve prolapse Father    Diabetes Father    Mitral valve prolapse Mother    Hypertension Mother    Stroke Mother 72       after valve surgery    Parkinson's disease Brother    Other Brother        POST WAR TRAUMA   Stroke Paternal Grandfather    Heart failure Maternal Grandfather    Cancer Neg Hx    Colon cancer Neg Hx    Stomach cancer Neg Hx    Esophageal cancer Neg Hx    Pancreatic cancer Neg Hx    Liver disease Neg Hx    Allergies  Allergen Reactions   Ace Inhibitors Cough   Crestor [Rosuvastatin] Other (See Comments)    myalgias   Wellbutrin [Bupropion Hcl] Other (See Comments)    Unknown reaction   Cymbalta [Duloxetine Hcl] Palpitations and Other (See Comments)  headaches   Lipitor [Atorvastatin] Cough   Pregabalin Other (See Comments)    Unknown reaction   Tegretol [Carbamazepine] Other (See Comments)    Dizziness, headache   Current Outpatient Medications on File Prior to Visit  Medication Sig Dispense Refill   Accu-Chek Softclix Lancets lancets Use as instructed 100 each 12   acetaminophen (TYLENOL) 500 MG tablet Take 1 tablet (500 mg total) by mouth every 6 (six) hours as needed (pain). 30 tablet 0   albuterol (VENTOLIN HFA) 108 (90 Base) MCG/ACT inhaler TAKE 2 PUFFS BY MOUTH EVERY 6 HOURS AS NEEDED FOR WHEEZE OR SHORTNESS OF BREATH 18 each 2   amLODipine (NORVASC) 10 MG tablet Take 1 tablet (10 mg total) by mouth daily. 90 tablet 3   aspirin 81 MG tablet Take 81 mg by mouth daily.     benzonatate (TESSALON) 200 MG capsule Take 1 capsule (200 mg total) by mouth 3 (three) times daily as needed for cough. 30 capsule 1   blood glucose meter kit and supplies Dispense based on patient and insurance preference. Use qd and prn for uncontrolled diabetes. (FOR ICD-10 E10.9, E11.9). 1 each 1   Budeson-Glycopyrrol-Formoterol (BREZTRI AEROSPHERE) 160-9-4.8 MCG/ACT AERO TAKE 2 PUFFS BY MOUTH TWICE A DAY 32.1 g 3   Calcium 250 MG CAPS Take 500 mg by mouth in the morning and at bedtime.     chlorpheniramine (CHLOR-TRIMETON) 4 MG tablet Take 4 mg by mouth in the morning, at noon, and at bedtime.     chlorthalidone  (HYGROTON) 25 MG tablet Take 0.5 tablets (12.5 mg total) by mouth daily. 1 tablet 0   Cholecalciferol (VITAMIN D3) 1000 units CAPS Take 1 capsule (1,000 Units total) by mouth daily. 30 capsule    esomeprazole (NEXIUM) 40 MG capsule 1 capsule by mouth every day before breakfast 90 capsule 6   famotidine (PEPCID) 20 MG tablet TAKE 1 TABLET BY MOUTH EVERYDAY AT BEDTIME 90 tablet 6   ferrous sulfate 325 (65 FE) MG EC tablet Take 325 mg by mouth every Monday, Wednesday, and Friday.     Garlic (GARLIQUE PO) Take 1 tablet by mouth daily.     Glucose Blood (BLOOD GLUCOSE TEST STRIPS) STRP 1 each by In Vitro route daily. May substitute to any manufacturer covered by patient's insurance.  For diabetes type 2 100 strip 3   glucose blood test strip Use as instructed 100 each 12   ibuprofen (ADVIL) 600 MG tablet Take 1 tablet (600 mg total) by mouth every 6 (six) hours as needed. 30 tablet 0   levothyroxine (SYNTHROID) 100 MCG tablet TAKE 1 TABLET BY MOUTH EVERY DAY BEFORE BREAKFAST 90 tablet 2   MAGNESIUM PO Take 2 tablets by mouth daily.     metFORMIN (GLUCOPHAGE) 1000 MG tablet Take 1 tablet (1,000 mg total) by mouth 2 (two) times daily with a meal. 180 tablet 3   methocarbamol (ROBAXIN-750) 750 MG tablet Take 1 tablet (750 mg total) by mouth 4 (four) times daily. 20 tablet 0   montelukast (SINGULAIR) 10 MG tablet TAKE 1 TABLET BY MOUTH EVERYDAY AT BEDTIME 90 tablet 4   Multiple Vitamins-Minerals (VISION HEALTH PO) Take by mouth.     Nutritional Supplements (GLUCOSE MANAGEMENT PO) Take by mouth.     nystatin-triamcinolone (MYCOLOG II) cream SMARTSIG:By Mouth     Omega-3 Fatty Acids (OMEGA 3 500 PO) Take 1 capsule by mouth in the morning and at bedtime.     polyethylene glycol (MIRALAX / GLYCOLAX) packet Take 17  g by mouth 2 (two) times a week.     polyethylene glycol powder (GLYCOLAX/MIRALAX) 17 GM/SCOOP powder Take 17 g by mouth daily. Drink 17g (1 scoop) dissolved in water per day. 255 g 0   Polyvinyl  Alcohol-Povidone (REFRESH OP) Place 1 drop into both eyes daily.      Specialty Vitamins Products (CARDIOVASCULAR SUPPORT PO) Take 2 capsules by mouth daily.     valsartan (DIOVAN) 320 MG tablet TAKE 1 TABLET BY MOUTH EVERY DAY 90 tablet 3   No current facility-administered medications on file prior to visit.    Review of Systems  Constitutional:  Negative for activity change, appetite change, fatigue, fever and unexpected weight change.  HENT:  Negative for congestion, ear pain, rhinorrhea, sinus pressure and sore throat.   Eyes:  Negative for pain, redness and visual disturbance.  Respiratory:  Negative for cough, shortness of breath and wheezing.   Cardiovascular:  Negative for chest pain and palpitations.  Gastrointestinal:  Negative for abdominal pain, blood in stool, constipation and diarrhea.  Endocrine: Negative for polydipsia and polyuria.  Genitourinary:  Negative for dysuria, frequency and urgency.       Incontinence prolapse  Musculoskeletal:  Negative for arthralgias, back pain and myalgias.  Skin:  Negative for pallor and rash.  Allergic/Immunologic: Negative for environmental allergies.  Neurological:  Negative for dizziness, syncope and headaches.  Hematological:  Negative for adenopathy. Does not bruise/bleed easily.  Psychiatric/Behavioral:  Negative for decreased concentration and dysphoric mood. The patient is not nervous/anxious.        Objective:   Physical Exam Constitutional:      General: She is not in acute distress.    Appearance: Normal appearance. She is well-developed and normal weight. She is not ill-appearing or diaphoretic.  HENT:     Head: Normocephalic and atraumatic.     Mouth/Throat:     Mouth: Mucous membranes are moist.  Eyes:     Conjunctiva/sclera: Conjunctivae normal.     Pupils: Pupils are equal, round, and reactive to light.  Neck:     Thyroid: No thyromegaly.     Vascular: No carotid bruit or JVD.  Cardiovascular:     Rate and  Rhythm: Normal rate and regular rhythm.     Heart sounds: Normal heart sounds.     No gallop.  Pulmonary:     Effort: Pulmonary effort is normal. No respiratory distress.     Breath sounds: Normal breath sounds. No wheezing or rales.  Abdominal:     General: There is no distension or abdominal bruit.     Palpations: Abdomen is soft.  Musculoskeletal:     Cervical back: Normal range of motion and neck supple.     Right lower leg: No edema.     Left lower leg: No edema.  Lymphadenopathy:     Cervical: No cervical adenopathy.  Skin:    General: Skin is warm and dry.     Coloration: Skin is not pale.     Findings: No rash.  Neurological:     Mental Status: She is alert.     Coordination: Coordination normal.     Deep Tendon Reflexes: Reflexes are normal and symmetric. Reflexes normal.  Psychiatric:        Mood and Affect: Mood normal.           Assessment & Plan:   Problem List Items Addressed This Visit       Cardiovascular and Mediastinum   Essential hypertension - Primary  BP is improved with current medicines  BP: 128/70   Chlorthalidone 12.5 mg daily (was cut from 25 due to hyponatremia and will have labs today) Amlodipine 10 mg daily (inc from 50  Valsartan 320 mg daily   Tolerating well  Bp improved Bmet pending   She is prepping for uro/gyn surgery later this mo      Relevant Orders   Basic metabolic panel     Endocrine   Hyperlipidemia associated with type 2 diabetes mellitus ([place])    Disc goals for lipids and reasons to control them Rev last labs with pt Rev low sat fat diet in detail Pt is afraid of statin Was given info on zetia to read Questions answered  She is open to it  but not until after her uro gyn surgery  She will call when ready to have medication sent in  Then plan next lipid panel          Other   Hyponatremia    Last na was 126 No symptoms   We cut her chlorthalidone to 12.5 mg (from 25) Re check bmet today   If  not improved may have to d/c that  Bp is in good control      Relevant Orders   Basic metabolic panel

## 2022-04-14 NOTE — Assessment & Plan Note (Signed)
Disc goals for lipids and reasons to control them Rev last labs with pt Rev low sat fat diet in detail Pt is afraid of statin Was given info on zetia to read Questions answered  She is open to it  but not until after her uro gyn surgery  She will call when ready to have medication sent in  Then plan next lipid panel

## 2022-04-14 NOTE — Patient Instructions (Addendum)
Take care of yourself   Blood pressure looks good Lab today  (hoping sodium will look better )   Continue current medicines   Let us know if you want to start the zetia after you recover from surgery

## 2022-04-14 NOTE — Assessment & Plan Note (Signed)
BP is improved with current medicines  BP: 128/70   Chlorthalidone 12.5 mg daily (was cut from 25 due to hyponatremia and will have labs today) Amlodipine 10 mg daily (inc from 50  Valsartan 320 mg daily   Tolerating well  Bp improved Bmet pending   She is prepping for uro/gyn surgery later this mo

## 2022-04-15 ENCOUNTER — Encounter (HOSPITAL_BASED_OUTPATIENT_CLINIC_OR_DEPARTMENT_OTHER): Payer: Self-pay | Admitting: Obstetrics and Gynecology

## 2022-04-15 NOTE — Progress Notes (Addendum)
Spoke w/ via phone for pre-op interview--- pt Lab needs dos----  [employer] results------ current EKG in epic/ chart COVID test -----patient states asymptomatic no test needed Arrive at ------- 0830 on 04-19-2022 NPO after MN NO Solid Food.  Clear liquids from MN until--- 0800 Med rec completed Medications to take morning of surgery ----- norvasc, nexium, synthroid, breztri inhaler Diabetic medication ----- do not take metformin morning of surgery Patient instructed no nail polish to be worn day of surgery Patient instructed to bring photo id and insurance card day of surgery Patient aware to have Driver (ride ) / caregiver    for 24 hours after surgery -- daughter, tracey Patient Special Instructions ----- asked to bring rescue inhaler dos Pre-Op special Istructions ----- pt has telephone cardiac clearance by Diona Browner NP on 03-29-2022 in epic/ chart.  Pt has pulmonology office visit pre-op clearance by Marland Kitchen NP on 04-05-2022 in epic/ chart Pt stated was given instructions to stop asa 7 days prior to surgery from cardiology, last dose 04-12-2022. Patient verbalized understanding of instructions that were given at this phone interview. Patient denies shortness of breath, chest pain, fever, cough at this phone interview.

## 2022-04-18 NOTE — Anesthesia Preprocedure Evaluation (Signed)
Anesthesia Evaluation  Patient identified by MRN, date of birth, ID band Patient awake    Reviewed: Allergy & Precautions, NPO status , Patient's Chart, lab work & pertinent test results  Airway Mallampati: II  TM Distance: >3 FB Neck ROM: Full    Dental no notable dental hx. (+) Teeth Intact, Dental Advisory Given   Pulmonary asthma    Pulmonary exam normal breath sounds clear to auscultation       Cardiovascular hypertension, Normal cardiovascular exam Rhythm:Regular Rate:Normal     Neuro/Psych  Neuromuscular disease    GI/Hepatic hiatal hernia,GERD  ,,  Endo/Other  diabetes    Renal/GU Lab Results      Component                Value               Date                      CREATININE               0.83                04/14/2022                    K                        4.2                 04/14/2022                     Musculoskeletal  (+) Arthritis ,    Abdominal   Peds  Hematology   Anesthesia Other Findings All:See list  Reproductive/Obstetrics                             Anesthesia Physical Anesthesia Plan  ASA: 3  Anesthesia Plan: General   Post-op Pain Management: Ofirmev IV (intra-op)* and Precedex   Induction: Intravenous  PONV Risk Score and Plan: 3 and Treatment may vary due to age or medical condition and Ondansetron  Airway Management Planned: LMA  Additional Equipment:   Intra-op Plan:   Post-operative Plan: Extubation in OR  Informed Consent: I have reviewed the patients History and Physical, chart, labs and discussed the procedure including the risks, benefits and alternatives for the proposed anesthesia with the patient or authorized representative who has indicated his/her understanding and acceptance.     Dental advisory given  Plan Discussed with: CRNA and Anesthesiologist  Anesthesia Plan Comments:        Anesthesia Quick Evaluation

## 2022-04-19 ENCOUNTER — Encounter (HOSPITAL_BASED_OUTPATIENT_CLINIC_OR_DEPARTMENT_OTHER): Payer: Self-pay | Admitting: Obstetrics and Gynecology

## 2022-04-19 ENCOUNTER — Ambulatory Visit (HOSPITAL_BASED_OUTPATIENT_CLINIC_OR_DEPARTMENT_OTHER): Payer: Medicare PPO | Admitting: Anesthesiology

## 2022-04-19 ENCOUNTER — Ambulatory Visit (HOSPITAL_BASED_OUTPATIENT_CLINIC_OR_DEPARTMENT_OTHER)
Admission: RE | Admit: 2022-04-19 | Discharge: 2022-04-19 | Disposition: A | Discharge status: Home / Self Care | Payer: Medicare PPO | Attending: Obstetrics and Gynecology | Admitting: Obstetrics and Gynecology

## 2022-04-19 ENCOUNTER — Telehealth: Payer: Self-pay | Admitting: Obstetrics and Gynecology

## 2022-04-19 ENCOUNTER — Other Ambulatory Visit: Payer: Self-pay

## 2022-04-19 ENCOUNTER — Encounter (HOSPITAL_BASED_OUTPATIENT_CLINIC_OR_DEPARTMENT_OTHER)
Admission: RE | Disposition: A | Discharge status: Home / Self Care | Payer: Self-pay | Source: Home / Self Care | Attending: Obstetrics and Gynecology

## 2022-04-19 DIAGNOSIS — E119 Type 2 diabetes mellitus without complications: Secondary | ICD-10-CM

## 2022-04-19 DIAGNOSIS — Z7984 Long term (current) use of oral hypoglycemic drugs: Secondary | ICD-10-CM | POA: Diagnosis not present

## 2022-04-19 DIAGNOSIS — N813 Complete uterovaginal prolapse: Secondary | ICD-10-CM

## 2022-04-19 DIAGNOSIS — Z01818 Encounter for other preprocedural examination: Secondary | ICD-10-CM

## 2022-04-19 DIAGNOSIS — K449 Diaphragmatic hernia without obstruction or gangrene: Secondary | ICD-10-CM | POA: Insufficient documentation

## 2022-04-19 DIAGNOSIS — N812 Incomplete uterovaginal prolapse: Secondary | ICD-10-CM | POA: Diagnosis not present

## 2022-04-19 DIAGNOSIS — K219 Gastro-esophageal reflux disease without esophagitis: Secondary | ICD-10-CM | POA: Diagnosis not present

## 2022-04-19 DIAGNOSIS — J45909 Unspecified asthma, uncomplicated: Secondary | ICD-10-CM | POA: Insufficient documentation

## 2022-04-19 DIAGNOSIS — I10 Essential (primary) hypertension: Secondary | ICD-10-CM | POA: Diagnosis not present

## 2022-04-19 HISTORY — PX: COLPOCLEISIS: SHX6814

## 2022-04-19 HISTORY — PX: CYSTOSCOPY: SHX5120

## 2022-04-19 HISTORY — DX: Moderate persistent asthma, uncomplicated: J45.40

## 2022-04-19 HISTORY — DX: Type 2 diabetes mellitus without complications: E11.9

## 2022-04-19 HISTORY — DX: Incomplete uterovaginal prolapse: N81.2

## 2022-04-19 HISTORY — DX: Presence of spectacles and contact lenses: Z97.3

## 2022-04-19 HISTORY — DX: Unspecified macular degeneration: H35.30

## 2022-04-19 HISTORY — PX: RECTOCELE REPAIR: SHX761

## 2022-04-19 HISTORY — DX: Other allergic rhinitis: J30.89

## 2022-04-19 HISTORY — DX: Cystocele, unspecified: N81.10

## 2022-04-19 HISTORY — DX: Other constipation: K59.09

## 2022-04-19 HISTORY — DX: Migraine, unspecified, not intractable, without status migrainosus: G43.909

## 2022-04-19 LAB — POCT I-STAT, CHEM 8
BUN: 15 mg/dL (ref 8–23)
Calcium, Ion: 1.21 mmol/L (ref 1.15–1.40)
Chloride: 95 mmol/L — ABNORMAL LOW (ref 98–111)
Creatinine, Ser: 0.8 mg/dL (ref 0.44–1.00)
Glucose, Bld: 142 mg/dL — ABNORMAL HIGH (ref 70–99)
HCT: 40 % (ref 36.0–46.0)
Hemoglobin: 13.6 g/dL (ref 12.0–15.0)
Potassium: 3.7 mmol/L (ref 3.5–5.1)
Sodium: 135 mmol/L (ref 135–145)
TCO2: 27 mmol/L (ref 22–32)

## 2022-04-19 LAB — GLUCOSE, CAPILLARY: Glucose-Capillary: 168 mg/dL — ABNORMAL HIGH (ref 70–99)

## 2022-04-19 SURGERY — COLPOCLEISIS
Anesthesia: General | Site: Vagina

## 2022-04-19 MED ORDER — ENOXAPARIN SODIUM 40 MG/0.4ML IJ SOSY
PREFILLED_SYRINGE | INTRAMUSCULAR | Status: AC
  Filled 2022-04-19: qty 0.4

## 2022-04-19 MED ORDER — ENOXAPARIN SODIUM 40 MG/0.4ML IJ SOSY
40.0000 mg | PREFILLED_SYRINGE | INTRAMUSCULAR | Status: AC
  Administered 2022-04-19: 40 mg via SUBCUTANEOUS

## 2022-04-19 MED ORDER — ONDANSETRON HCL 4 MG/2ML IJ SOLN
INTRAMUSCULAR | Status: AC
  Filled 2022-04-19: qty 2

## 2022-04-19 MED ORDER — ESTRADIOL 0.1 MG/GM VA CREA: TOPICAL_CREAM | VAGINAL | 1 refills | Status: DC

## 2022-04-19 MED ORDER — LIDOCAINE HCL (PF) 2 % IJ SOLN
INTRAMUSCULAR | Status: AC
  Filled 2022-04-19: qty 5

## 2022-04-19 MED ORDER — ACETAMINOPHEN 10 MG/ML IV SOLN: 1000.0000 mg | Freq: Once | INTRAVENOUS | Status: DC | PRN

## 2022-04-19 MED ORDER — FENTANYL CITRATE (PF) 100 MCG/2ML IJ SOLN
INTRAMUSCULAR | Status: DC | PRN
  Administered 2022-04-19 (×2): 25 ug via INTRAVENOUS
  Administered 2022-04-19: 50 ug via INTRAVENOUS

## 2022-04-19 MED ORDER — ONDANSETRON HCL 4 MG/2ML IJ SOLN
INTRAMUSCULAR | Status: DC | PRN
  Administered 2022-04-19: 4 mg via INTRAVENOUS

## 2022-04-19 MED ORDER — ROCURONIUM BROMIDE 10 MG/ML (PF) SYRINGE
PREFILLED_SYRINGE | INTRAVENOUS | Status: AC
  Filled 2022-04-19: qty 10

## 2022-04-19 MED ORDER — FLUORESCEIN SODIUM 10 % IV SOLN
INTRAVENOUS | Status: DC | PRN
  Administered 2022-04-19: .25 mL via INTRAVENOUS

## 2022-04-19 MED ORDER — FENTANYL CITRATE (PF) 100 MCG/2ML IJ SOLN: 25.0000 ug | INTRAMUSCULAR | Status: DC | PRN

## 2022-04-19 MED ORDER — ACETAMINOPHEN 10 MG/ML IV SOLN
INTRAVENOUS | Status: DC | PRN
  Administered 2022-04-19: 1000 mg via INTRAVENOUS

## 2022-04-19 MED ORDER — CEFAZOLIN SODIUM-DEXTROSE 2-4 GM/100ML-% IV SOLN
INTRAVENOUS | Status: AC
  Filled 2022-04-19: qty 100

## 2022-04-19 MED ORDER — FENTANYL CITRATE (PF) 100 MCG/2ML IJ SOLN
INTRAMUSCULAR | Status: AC
  Filled 2022-04-19: qty 2

## 2022-04-19 MED ORDER — PROPOFOL 10 MG/ML IV BOLUS
INTRAVENOUS | Status: DC | PRN
  Administered 2022-04-19: 110 mg via INTRAVENOUS

## 2022-04-19 MED ORDER — DEXAMETHASONE SODIUM PHOSPHATE 10 MG/ML IJ SOLN
INTRAMUSCULAR | Status: AC
  Filled 2022-04-19: qty 1

## 2022-04-19 MED ORDER — ONDANSETRON HCL 4 MG/2ML IJ SOLN: 4.0000 mg | Freq: Once | INTRAMUSCULAR | Status: DC | PRN

## 2022-04-19 MED ORDER — POVIDONE-IODINE 10 % EX SWAB: 2.0000 | Freq: Once | CUTANEOUS | Status: DC

## 2022-04-19 MED ORDER — DEXAMETHASONE SODIUM PHOSPHATE 10 MG/ML IJ SOLN
INTRAMUSCULAR | Status: DC | PRN
  Administered 2022-04-19: 4 mg via INTRAVENOUS

## 2022-04-19 MED ORDER — LIDOCAINE-EPINEPHRINE 1 %-1:100000 IJ SOLN
INTRAMUSCULAR | Status: DC | PRN
  Administered 2022-04-19: 39 mL

## 2022-04-19 MED ORDER — 0.9 % SODIUM CHLORIDE (POUR BTL) OPTIME
TOPICAL | Status: DC | PRN
  Administered 2022-04-19: 500 mL

## 2022-04-19 MED ORDER — LIDOCAINE 2% (20 MG/ML) 5 ML SYRINGE
INTRAMUSCULAR | Status: DC | PRN
  Administered 2022-04-19: 80 mg via INTRAVENOUS

## 2022-04-19 MED ORDER — LACTATED RINGERS IV SOLN
INTRAVENOUS | Status: DC
  Administered 2022-04-19: 1000 mL via INTRAVENOUS

## 2022-04-19 MED ORDER — ACETAMINOPHEN 10 MG/ML IV SOLN
INTRAVENOUS | Status: AC
  Filled 2022-04-19: qty 100

## 2022-04-19 MED ORDER — PROPOFOL 10 MG/ML IV BOLUS
INTRAVENOUS | Status: AC
  Filled 2022-04-19: qty 20

## 2022-04-19 MED ORDER — PHENYLEPHRINE 80 MCG/ML (10ML) SYRINGE FOR IV PUSH (FOR BLOOD PRESSURE SUPPORT)
PREFILLED_SYRINGE | INTRAVENOUS | Status: DC | PRN
  Administered 2022-04-19 (×6): 80 ug via INTRAVENOUS

## 2022-04-19 MED ORDER — CEFAZOLIN SODIUM-DEXTROSE 2-4 GM/100ML-% IV SOLN
2.0000 g | INTRAVENOUS | Status: AC
  Administered 2022-04-19: 2 g via INTRAVENOUS

## 2022-04-19 SURGICAL SUPPLY — 30 items
BLADE CLIPPER SENSICLIP SURGIC (BLADE) IMPLANT
BLADE SURG 15 STRL LF DISP TIS (BLADE) ×3 IMPLANT
BLADE SURG 15 STRL SS (BLADE) ×2
ELECT REM PT RETURN 9FT ADLT (ELECTROSURGICAL) ×2
ELECTRODE REM PT RTRN 9FT ADLT (ELECTROSURGICAL) IMPLANT
GLOVE BIOGEL PI IND STRL 6.5 (GLOVE) ×3 IMPLANT
GLOVE BIOGEL PI IND STRL 7.0 (GLOVE) ×3 IMPLANT
GLOVE ECLIPSE 6.0 STRL STRAW (GLOVE) ×3 IMPLANT
GOWN STRL REUS W/TWL LRG LVL3 (GOWN DISPOSABLE) ×3 IMPLANT
HIBICLENS CHG 4% 4OZ BTL (MISCELLANEOUS) ×3 IMPLANT
HOLDER FOLEY CATH W/STRAP (MISCELLANEOUS) ×3 IMPLANT
KIT TURNOVER CYSTO (KITS) ×3 IMPLANT
MANIFOLD NEPTUNE II (INSTRUMENTS) ×3 IMPLANT
NDL HYPO 22X1.5 SAFETY MO (MISCELLANEOUS) ×3 IMPLANT
NEEDLE HYPO 22X1.5 SAFETY MO (MISCELLANEOUS) ×2 IMPLANT
NEEDLE SAFETY HYPO 22GAX1.5 (MISCELLANEOUS) ×2
NS IRRIG 500ML POUR BTL (IV SOLUTION) IMPLANT
PACK VAGINAL WOMENS (CUSTOM PROCEDURE TRAY) ×3 IMPLANT
PAD OB MATERNITY 4.3X12.25 (PERSONAL CARE ITEMS) ×3 IMPLANT
RETRACTOR LONE STAR DISPOSABLE (INSTRUMENTS) ×3 IMPLANT
RETRACTOR STAY HOOK 5MM (MISCELLANEOUS) ×3 IMPLANT
SET IRRIG Y TYPE TUR BLADDER L (SET/KITS/TRAYS/PACK) ×3 IMPLANT
SLEEVE SCD COMPRESS KNEE MED (STOCKING) ×3 IMPLANT
SPIKE FLUID TRANSFER (MISCELLANEOUS) IMPLANT
SUT VIC AB 0 CT1 27 (SUTURE) ×2
SUT VIC AB 0 CT1 27XCR 8 STRN (SUTURE) ×3 IMPLANT
SUT VICRYL 2-0 SH 8X27 (SUTURE) ×3 IMPLANT
SYR BULB EAR ULCER 3OZ GRN STR (SYRINGE) ×3 IMPLANT
TOWEL OR 17X24 6PK STRL BLUE (TOWEL DISPOSABLE) ×3 IMPLANT
TRAY FOLEY W/BAG SLVR 14FR LF (SET/KITS/TRAYS/PACK) ×3 IMPLANT

## 2022-04-19 NOTE — Telephone Encounter (Signed)
Jordan Patterson underwent [place] Colpoclesis, levator plication, perineorrhaphy, cystoscopy on 04/19/22.   She passed her voiding trial.  379m was backfilled into the bladder Voided 2553mPVR by bladder scan was 30m34m  She was discharged without a catheter. Please call her for a routine post op check. Thanks!  MicJaquita FoldsD

## 2022-04-19 NOTE — Op Note (Signed)
Operative Note  Preoperative Diagnosis: anterior vaginal prolapse, posterior vaginal prolapse, and uterovaginal prolapse, complete  Postoperative Diagnosis: same  Procedures performed:  [place] Colpoclesis, levator plication, perineorrhaphy, cystoscopy  Implants: none  Attending Surgeon: Sherlene Shams, MD  Anesthesia: General LMA  Findings: 1. 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    2. Mild urethral prolapse present. On cystoscopy, normal bladder and urethra without injury, lesion or foreign body. Brisk bilateral ureteral efflux noted.   Specimens: * No specimens in log *  Estimated blood loss: 50 mL  IV fluids: 800 mL  Urine output: [id number] mL  Complications: none  Procedure in Detail:  After informed consent was obtained, the patient was taken to the operating room where she was placed under anesthesia.  She was then placed in the dorsal lithotomy position, taking care to avoid traction on the extremities. She was then prepped and draped in the usual sterile fashion.  A self-retaining lonestar retractor was placed using four elastic blue stays.  After a foley catheter was inserted into the urethra, the location of the midurethra was palpated. Two Allis clamps were placed at the vaginal apex. 1% lidocaine with epinephrine was injected into the vaginal mucosa circumferentially.  A marking pen was used to delineate anterior and posterior rectangles of vaginal mucosa to remove with remaining lateral tunnels. 1% lidocaine with epinephrine was injected into the mucosa. An incision was made with a 15 blade scalpel along these marked lines. After an Allis clamp was placed on the vaginal mucosa, the underlying vaginal muscularis was dissected off the mucosa.  The vaginal mucosa was excised off each anterior and posterior section. Excellent hemostasis was obtained with cautery. The mucosa on the lateral sides was closed into a tunnel with a 0-vicryl suture.  Serial pursestring sutures  were used to imbricate and the cervix and vaginal prolapse using 0 vicryl. Anterior and posterior plication sutures were placed to reapproximate the epithelium. The mucosa was then closed with a 0-vicryl suture in a running fashion.   Attention was then turned to the perineum. Two allis clamps were placed at the introitus. The perineum was injected with 1% lidocaine with epinephrine. A diamond shaped incision was made over the perineum and excess skin was removed. Dissection was performed with Metzenbaum scissors to separate the mucosa from the underlying tissue. Levator plication was performed by approximating the left and right levators in the midline with a 0-vicryl suture.  The perineal body was then reapproximated with two interrupted 0-vicryl sutures. The posterior vaginal and perineal skin was then closed with a 2-0 vicryl in running fashion. Irrigation was performed and good hemostasis was noted.   The Foley catheter was removed.  A 70-degree cystoscope was introduced, and 360-degree inspection revealed no trauma or lesions the bladder, with bilateral ureteral efflux.  The bladder was drained and the cystoscope was removed.  The Foley catheter was reinserted. Hemostasis was noted. The patient tolerated the procedure well.  She was awakened from anesthesia and transferred to the recovery room in stable condition. All counts were correct x 2.    Jaquita Folds, MD

## 2022-04-19 NOTE — Discharge Instructions (Addendum)
POST OPERATIVE INSTRUCTIONS  General Instructions Recovery (not bed rest) will last approximately 6 weeks Walking is encouraged, but refrain from strenuous exercise/ housework/ heavy lifting. No lifting >10lbs  Nothing in the vagina- NO intercourse, tampons or douching Bathing:  Do not submerge in water (NO swimming, bath, hot tub, etc) until after your postop visit. You can shower starting the day after surgery.  No driving until you are not taking narcotic pain medicine and until your pain is well enough controlled that you can slam on the breaks or make sudden movements if needed.   Taking your medications Please take your acetaminophen and ibuprofen on a schedule for the first 48 hours. Take '600mg'$  ibuprofen, then take '500mg'$  acetaminophen 3 hours later, then continue to alternate ibuprofen and acetaminophen. That way you are taking each type of medication every 6 hours. Take the prescribed narcotic (oxycodone, tramadol, etc) as needed, with a maximum being every 4 hours.  Take a stool softener daily to keep your stools soft and preventing you from straining. If you have diarrhea, you decrease your stool softener. This is explained more below. We have prescribed you Miralax. Place a pea sized amount of estrace cream around the urethra and at the vaginal opening every night for two weeks then two times a week after.   Reasons to Call the Nurse (see last page for phone numbers) Heavy Bleeding (changing your pad every 1-2 hours) Persistent nausea/vomiting Fever (100.4 degrees or more) Incision problems (pus or other fluid coming out, redness, warmth, increased pain)  Things to Expect After Surgery Mild to Moderate pain is normal during the first day or two after surgery. If prescribed, take Ibuprofen or Tylenol first and use the stronger medicine for "break-through" pain. You can overlap these medicines because they work differently.   Constipation   To Prevent Constipation:  Eat a  well-balanced diet including protein, grains, fresh fruit and vegetables.  Drink plenty of fluids. Walk regularly.  Depending on specific instructions from your physician: take Miralax daily and additionally you can add a stool softener (colace/ docusate) and fiber supplement. Continue as long as you're on pain medications.   To Treat Constipation:  If you do not have a bowel movement in 2 days after surgery, you can take 2 Tbs of Milk of Magnesia 1-2 times a day until you have a bowel movement. If diarrhea occurs, decrease the amount or stop the laxative. If no results with Milk of Magnesia, you can drink a bottle of magnesium citrate which you can purchase over the counter.  Fatigue:  This is a normal response to surgery and will improve with time.  Plan frequent rest periods throughout the day.  Gas Pain:  This is very common but can also be very painful! Drink warm liquids such as herbal teas, bouillon or soup. Walking will help you pass more gas.  Mylicon or Gas-X can be taken over the counter.  Leaking Urine:  Varying amounts of leakage may occur after surgery.  This should improve with time. Your bladder needs at least 3 months to recover from surgery. If you leak after surgery, be sure to mention this to your doctor at your post-op visit. If you were taking medications for overactive bladder prior to surgery, be sure to restart the medications immediately after surgery.  Incisions: If you have incisions on your abdomen, the skin glue will dissolve on its own over time. It is ok to gently rinse with soap and water over these incisions but  do not scrub.  Catheter Approximately 50% of patients are unable to urinate after surgery and need to go home with a catheter. This allows your bladder to rest so it can return to full function. If you go home with a catheter, the office will call to set up a voiding trial a few days after surgery. For most patients, by this visit, they are able to urinate on  their own. Long term catheter use is rare.   Return to Work  As work demands and recovery times vary widely, it is hard to predict when you will want to return to work. If you have a desk job with no strenuous physical activity, and if you would like to return sooner than generally recommended, discuss this with your provider or call our office.   Post op concerns  For non-emergent issues, please call the Urogynecology Nurse. Please leave a message and someone will contact you within one business day.  You can also send a message through [facility].   AFTER HOURS (After 5:00 PM and on weekends):  For urgent matters that cannot wait until the next business day. Call our office [phone] and connect to the doctor on call.  Please reserve this for important issues.   **FOR ANY TRUE EMERGENCY ISSUES CALL 911 OR GO TO THE NEAREST EMERGENCY ROOM.** Please inform our office or the doctor on call of any emergency.     APPOINTMENTS: Call [phone]   Post Anesthesia Home Care Instructions  Activity: Get plenty of rest for the remainder of the day. A responsible individual must stay with you for 24 hours following the procedure.  For the next 24 hours, DO NOT: -Drive a car -Paediatric nurse -Drink alcoholic beverages -Take any medication unless instructed by your physician -Make any legal decisions or sign important papers.  Meals: Start with liquid foods such as gelatin or soup. Progress to regular foods as tolerated. Avoid greasy, spicy, heavy foods. If nausea and/or vomiting occur, drink only clear liquids until the nausea and/or vomiting subsides. Call your physician if vomiting continues.  Special Instructions/Symptoms: Your throat may feel dry or sore from the anesthesia or the breathing tube placed in your throat during surgery. If this causes discomfort, gargle with warm salt water. The discomfort should disappear within 24 hours.  If you had a scopolamine patch placed behind  your ear for the management of post- operative nausea and/or vomiting:  1. The medication in the patch is effective for 72 hours, after which it should be removed.  Wrap patch in a tissue and discard in the trash. Wash hands thoroughly with soap and water. 2. You may remove the patch earlier than 72 hours if you experience unpleasant side effects which may include dry mouth, dizziness or visual disturbances. 3. Avoid touching the patch. Wash your hands with soap and water after contact with the patch.    No acetaminophen/Tylenol until after 5 pm today if needed.

## 2022-04-19 NOTE — Transfer of Care (Signed)
Immediate Anesthesia Transfer of Care Note  Patient: Jordan Patterson  Procedure(s) Performed: Rogelia Mire COLPOCLEISIS (Vagina ) Levator plication with perineorrhaphy (Vagina ) CYSTOSCOPY (Bladder)  Patient Location: PACU  Anesthesia Type:General  Level of Consciousness: drowsy and patient cooperative  Airway & Oxygen Therapy: Patient Spontanous Breathing and Patient connected to nasal cannula oxygen  Post-op Assessment: Report given to RN and Post -op Vital signs reviewed and stable  Post vital signs: Reviewed and stable  Last Vitals:  Vitals Value Taken Time  BP    Temp    Pulse 95 04/19/22 1203  Resp    SpO2 98 % 04/19/22 1203  Vitals shown include unvalidated device data.  Last Pain:  Vitals:   04/19/22 0840  TempSrc: Oral      Patients Stated Pain Goal: 7 ([phone] [id number])  Complications: No notable events documented.

## 2022-04-19 NOTE — Anesthesia Postprocedure Evaluation (Signed)
Anesthesia Post Note  Patient: Jordan Patterson  Procedure(s) Performed: Rogelia Mire COLPOCLEISIS (Vagina ) Levator plication with perineorrhaphy (Vagina ) CYSTOSCOPY (Bladder)     Patient location during evaluation: PACU Anesthesia Type: General Level of consciousness: awake and alert Pain management: pain level controlled Vital Signs Assessment: post-procedure vital signs reviewed and stable Respiratory status: spontaneous breathing, nonlabored ventilation, respiratory function stable and patient connected to nasal cannula oxygen Cardiovascular status: blood pressure returned to baseline and stable Postop Assessment: no apparent nausea or vomiting Anesthetic complications: no  No notable events documented.  Last Vitals:  Vitals:   04/19/22 1245 04/19/22 1330  BP: 118/77 (!) 147/77  Pulse: 92 94  Resp: 17 16  Temp:  36.6 C  SpO2: 95% 94%    Last Pain:  Vitals:   04/19/22 1330  TempSrc:   PainSc: 0-No pain                 Barnet Glasgow

## 2022-04-19 NOTE — Interval H&P Note (Signed)
History and Physical Interval Note:  04/19/2022 9:57 AM  Jordan Patterson  has presented today for surgery, with the diagnosis of anterior vaginal prolapse; uterovaginal prolapse incomplete; posterior vaginal prolapse.  The various methods of treatment have been discussed with the patient and family. After consideration of risks, benefits and other options for treatment, the patient has consented to  Procedure(s): [place] COLPOCLEISIS (N/A) Levator plication with perineorrhaphy (N/A) CYSTOSCOPY (N/A) as a surgical intervention.  The patient's history has been reviewed, patient examined, no change in status, stable for surgery.  I have reviewed the patient's chart and labs.  Questions were answered to the patient's satisfaction.     Jaquita Folds

## 2022-04-19 NOTE — Anesthesia Procedure Notes (Signed)
Procedure Name: LMA Insertion Date/Time: 04/19/2022 10:25 AM  Performed by: Georgeanne Nim, CRNAPre-anesthesia Checklist: Patient identified, Emergency Drugs available, Suction available, Patient being monitored and Timeout performed Patient Re-evaluated:Patient Re-evaluated prior to induction Oxygen Delivery Method: Circle system utilized Preoxygenation: Pre-oxygenation with 100% oxygen Induction Type: IV induction Ventilation: Mask ventilation without difficulty LMA: LMA inserted LMA Size: 4.0 Number of attempts: 1 Placement Confirmation: positive ETCO2 and breath sounds checked- equal and bilateral Tube secured with: Tape Dental Injury: Teeth and Oropharynx as per pre-operative assessment

## 2022-04-20 ENCOUNTER — Encounter (HOSPITAL_BASED_OUTPATIENT_CLINIC_OR_DEPARTMENT_OTHER): Payer: Self-pay | Admitting: Obstetrics and Gynecology

## 2022-04-21 ENCOUNTER — Encounter: Payer: Self-pay | Admitting: Family Medicine

## 2022-04-21 NOTE — Telephone Encounter (Signed)
Pain: Reports her pain is well controlled and has been doing Tylenol and ibuprofen every 3 hours alternating  Is urinating normally and reports she is doing well overall.  Reports some mild bleeding. Denies bright red bleeding or clots.  She has not yet had a bowel movement, has taken Miralax and has plans to take the milk of magnesia.  Is aware she can call the office with any concerns.

## 2022-04-22 ENCOUNTER — Telehealth: Payer: Self-pay | Admitting: Obstetrics and Gynecology

## 2022-04-22 NOTE — Telephone Encounter (Signed)
Patient called and was asking when she can decrease her Tylenol and ibuprofen use. We discussed she can start decreasing it today and see how she feels. She reports she has been able to have a bowel movement x2 since then. She has been walking and overall doing well.

## 2022-04-23 ENCOUNTER — Ambulatory Visit: Payer: Medicare PPO | Admitting: Obstetrics and Gynecology

## 2022-05-15 ENCOUNTER — Other Ambulatory Visit: Payer: Self-pay | Admitting: Gastroenterology

## 2022-05-17 DIAGNOSIS — M9902 Segmental and somatic dysfunction of thoracic region: Secondary | ICD-10-CM | POA: Diagnosis not present

## 2022-05-17 DIAGNOSIS — S233XXA Sprain of ligaments of thoracic spine, initial encounter: Secondary | ICD-10-CM | POA: Diagnosis not present

## 2022-05-31 ENCOUNTER — Ambulatory Visit (INDEPENDENT_AMBULATORY_CARE_PROVIDER_SITE_OTHER): Payer: Medicare PPO | Admitting: Obstetrics and Gynecology

## 2022-05-31 ENCOUNTER — Encounter: Payer: Self-pay | Admitting: Obstetrics and Gynecology

## 2022-05-31 VITALS — BP 134/74 | HR 91

## 2022-05-31 DIAGNOSIS — S233XXA Sprain of ligaments of thoracic spine, initial encounter: Secondary | ICD-10-CM | POA: Diagnosis not present

## 2022-05-31 DIAGNOSIS — N3281 Overactive bladder: Secondary | ICD-10-CM

## 2022-05-31 DIAGNOSIS — N393 Stress incontinence (female) (male): Secondary | ICD-10-CM

## 2022-05-31 DIAGNOSIS — M9902 Segmental and somatic dysfunction of thoracic region: Secondary | ICD-10-CM | POA: Diagnosis not present

## 2022-05-31 DIAGNOSIS — Z9889 Other specified postprocedural states: Secondary | ICD-10-CM

## 2022-05-31 NOTE — Progress Notes (Signed)
[facility]  Date of Visit: 05/31/2022  History of Present Illness: [name] is a 82 y.o. female scheduled today for a post-operative visit.   Surgery: s/p Ignatius Specking Colpoclesis, levator plication, perineorrhaphy, cystoscopy on 04/19/22  She passed her postoperative void trial.   Postoperative course has been uncomplicated.   Today she reports she has urinary leakage. Only has to wake 1 time per night. Overall feeling well but tired.   UTI in the last 6 weeks? No  Pain? No  She has returned to her normal activity (except for postop restrictions) Vaginal bulge? No  Stress incontinence: Yes - usually with coughing Urgency/frequency: No  Urge incontinence: Yes - id she waits too long Voiding dysfunction: No - feels like she is emptying well.  Bowel issues: No , taking miralax every day, half dose  Subjective Success: Do you usually have a bulge or something falling out that you can see or feel in the vaginal area? No  Retreatment Success: Any retreatment with surgery or pessary for any compartment? No    Medications: She has a current medication list which includes the following prescription(s): accu-chek softclix lancets, acetaminophen, albuterol, amlodipine, aspirin, benzonatate, blood glucose meter kit and supplies, breztri aerosphere, calcium, chlorpheniramine, chlorthalidone, esomeprazole, estradiol, famotidine, ferrous sulfate, garlic, glucose blood, ibuprofen, levothyroxine, magnesium lactate, metformin, methocarbamol, montelukast, eye health, nutritional supplements, nystatin-triamcinolone, omega-3 fatty acids, polyethylene glycol, polyethylene glycol powder, polyvinyl alcohol-povidone, valsartan, vitamin d-vitamin k, and vitamin e.   Allergies: Patient is allergic to ace inhibitors, crestor [rosuvastatin], wellbutrin [bupropion hcl], cymbalta [duloxetine hcl], lipitor [atorvastatin], pregabalin, and tegretol [carbamazepine].   Physical Exam: BP 134/74   Pulse 91    Pelvic Examination: Erythema on labia majora. Vagina: Incisions healing well. Sutures are present at the perineum and vaginal cuff, no granulation tissue present. No vaginal tenderness.   POP-Q: POP-Q  -3                                            Aa   -3                                           Ba  -2                                              C   3                                            Gh  6                                            Pb  3                                            tvl   -3  Ap  -3                                            Bp                                                 D   PVR (bladder scan)- 60ml  ---------------------------------------------------------  Assessment and Plan:  1. Post-operative state   2. SUI (stress urinary incontinence, female)   3. Overactive bladder    - For vulvar irritation, recommend zinc based cream for wetness protection. Can restart estrace cream twice a week. Can also use coconut oil or vitamin E for dryness.  - Can resume regular activity including exercise if desired.  - Discussed avoidance of heavy lifting and straining long term to reduce the risk of recurrence.  - Urinary retention has resolved with prolapse repair. PVR normal today and feels she is emptying better.  - For SUI, we reviewed options of pelvic PT, sling or urethral bulking. She is not sure she wants treatment at this time. Handouts provided.  - For OAB symptoms, we discussed options of PT, medication or PTNS. She does not want a medication at this time. PTNS handout provided.   Return as needed  Jordan Beards, MD

## 2022-05-31 NOTE — Patient Instructions (Signed)
Use a zinc cream on the labia to help with wetness protection.   Continue with estrace cream twice a week- can use internally. Can also use coconut oil or vitamin E for moisture.

## 2022-06-02 NOTE — Progress Notes (Signed)
HPI F never smoker followed for allergic rhinitis, asthma, complicated by GERD, hypothyroid, DM 2 FENO 12/29/15- 37-elevated, indicating allergic component to airway irritation. Office Spirometry 12/29/2015-moderately severe obstructive airways disease-FVC 1.99/60%, FEV1 1.29/51%, ratio 0.65, FEF 25-75 0.73/39%. Office Spirometry 08/17/2017- Moderate restriction.  FVC 2.1/66%, FEV1 1.6/65%, ratio 1.74, FEF 25-75% 1.3/65% CBC 08/18/2017-eos WNL 2.8, Allergy Profile 08/17/2017-elevated for dust mites, cat, pecan/hickory pollen, total IgE WNL 16 --------------------------------------------------------------------------------------------    12/01/21-  82 year old female never smoker followed for Allergic rhinitis, Asthma/ Chronic Cough, complicated by GERD/ HH (GI recs no Nissen at this time), hypothyroid, DM 2, HTN -Ventolin hfa, Breztri, Singulair, Pepcid/ Nexium,                         Trelegy didn't help cough Covid vax- 6 Phizer Flu vax- LOV Parrett, NP- 11/12/21- Recent exacerb Asthma, AR. Pending pelvic floor repair.> pred, doxy ------Still SOB on exertion, pt is doing some better Family member had Covid. Pt wasn't tested, or treated for Covid, but suspects that is what she had. PCP told her it wasn't necessary to test since she was past the treatment window. Baseline dry cough x many years, probably multifactorial with upper airway cough syndrome, asthma, postnasal drip and maybe some reflux. Has improved gradually since treatment by NP on 9/21, but not quite back to her normal. Discussed plans for pelvic floor repair by GYN in 2-3 weeks. No fever or purulent. We are going to try to calm airways more by extending prednisone taper. CXR 11/17/21-  IMPRESSION: No active cardiopulmonary disease.  06/03/22-  82 year old female never smoker followed for Allergic rhinitis, Asthma/ Chronic Cough, complicated by GERD/ HH (GI recs no Nissen at this time), hypothyroid, DM 2, HTN -Ventolin hfa, Breztri,  Singulair, Pepcid/ Nexium, Benzonatate,                          Trelegy didn't help cough LOV Cobb, NP 04/05/22- for surgical clearance Surgery for pelvic floor repair in February apparently went well. Using [place] and thinks it works better for her than Trelegy did.  Occasional use of rescue inhaler. She says she strangles easily on water and liquids.  No recent nocturnal cough or choking.  Stable chronic cough usually dry. CXR 04/07/22- IMPRESSION: No active cardiopulmonary disease.   ROS-see HPI    += positive Constitutional:   No-   weight loss, night sweats, fevers, chills, fatigue, lassitude. HEENT:   No-  headaches, difficulty swallowing, tooth/dental problems, sore throat,       +sneezing, itching, ear ache, nasal congestion, post nasal drip,  CV:  No-   chest pain, orthopnea, PND, swelling in lower extremities, anasarca,                                                        dizziness, palpitations Resp: No-   shortness of breath with exertion or at rest.              No-   productive cough,  + non-productive cough,  No- coughing up of blood.              No-   change in color of mucus.+ wheezing.   Skin: No-   rash or lesions. GI:  +  heartburn, indigestion,  No-abdominal pain, nausea, vomiting, GU:  MS:  No-   joint pain or swelling.  + back pain Neuro-     nothing unusual Psych:  No- change in mood or affect. No depression or anxiety.  No memory loss.  OBJ- Physical Exam General- Alert, Oriented, Affect-appropriate, Distress- none acute     Daughter here Skin- rash-none, lesions- none, excoriation- none Lymphadenopathy- none Head- atraumatic            Eyes- Gross vision intact, PERRLA, conjunctivae and secretions clear            Ears- clear            Nose- Clear, no-Septal dev, mucus, polyps, erosion, perforation             Throat- Mallampati II , mucosa clear , drainage+white, tonsils- atrophic Neck- flexible , trachea midline, no stridor , thyroid nl, carotid no  bruit Chest - symmetrical excursion , unlabored           Heart/CV- RRR , no murmur , no gallop  , no rub, nl s1 s2                           - JVD- none , edema- none, stasis changes- none, varices- none           Lung- clear to P&A/ no crackles, wheeze- none, cough+ dry with deep breath , dullness-none, rub- none           Chest wall- + back brace Abd-  Br/ Gen/ Rectal- Not done, not indicated Extrem- cyanosis- none, clubbing, none, atrophy- none, strength- nl Neuro- grossly intact to observation

## 2022-06-03 ENCOUNTER — Ambulatory Visit: Payer: Medicare PPO | Admitting: Internal Medicine

## 2022-06-03 ENCOUNTER — Encounter: Payer: Self-pay | Admitting: Internal Medicine

## 2022-06-03 VITALS — BP 130/66 | HR 93 | Ht 67.0 in | Wt 159.0 lb

## 2022-06-03 DIAGNOSIS — J4540 Moderate persistent asthma, uncomplicated: Secondary | ICD-10-CM | POA: Diagnosis not present

## 2022-06-03 DIAGNOSIS — K219 Gastro-esophageal reflux disease without esophagitis: Secondary | ICD-10-CM | POA: Diagnosis not present

## 2022-06-03 MED ORDER — BREZTRI AEROSPHERE 160-9-4.8 MCG/ACT IN AERO: INHALATION_SPRAY | RESPIRATORY_TRACT | 4 refills | Status: DC

## 2022-06-03 MED ORDER — BENZONATATE 200 MG PO CAPS: 200.0000 mg | ORAL_CAPSULE | Freq: Three times a day (TID) | ORAL | 5 refills | Status: DC | PRN

## 2022-06-03 NOTE — Patient Instructions (Signed)
Refill scripts sent for Breztri and tessalon  Please call if we can help

## 2022-06-07 DIAGNOSIS — H40053 Ocular hypertension, bilateral: Secondary | ICD-10-CM | POA: Diagnosis not present

## 2022-06-07 DIAGNOSIS — S233XXA Sprain of ligaments of thoracic spine, initial encounter: Secondary | ICD-10-CM | POA: Diagnosis not present

## 2022-06-07 DIAGNOSIS — M9902 Segmental and somatic dysfunction of thoracic region: Secondary | ICD-10-CM | POA: Diagnosis not present

## 2022-06-07 LAB — HM DIABETES EYE EXAM

## 2022-06-08 ENCOUNTER — Other Ambulatory Visit: Payer: Self-pay | Admitting: Gastroenterology

## 2022-06-09 NOTE — Progress Notes (Signed)
Jordan Patterson    [id number]    1940-05-01  Primary Care Physician:Tower, Audrie Gallus, MD  Referring Physician: Tower, Audrie Gallus, MD [address] [place],  [place] [address]  . Chief complaint:  GERD, chronic cough Chief Complaint  Patient presents with   yearly visit     No concerns or complaints.    HPI: 82 year old very pleasant female here for follow-up visit for chronic GERD and cough .  She underwent kyphoplasty T8-9 s/p thoracic compression fracture in December 2022.  She is recovering well from it.  I last saw her on 05-11-21. At that time, she had to have intermittent breakthrough regurgitation and cough. She was taking esomeprazole 40 mg twice daily and Pepcid at bedtime.   Today, she reports feeling well overall. She doesn't complain of any GI issues. She reports compliance with Nexium 40 mg in the morning and Pepcid 20 mg in the evening. She states that she has stopped taking her allergy injections and she is currently taking albuterol and an OTC antihistamine   She states that she had surgery in February after which she was started on Miralax and states it's causing her stool to be watery and is holding taking MiraLAX.    Denies diarrhea, constipation, nausea, blood in stool, black stool, vomiting, abdominal pain, bloating, unintentional weight loss, reflux, dysphagia.  GI Hx: EGD Jun 25, 2011: Irregular Z line with biopsies negative for intestinal metaplasia or Barrett's esophagus.  Status post dilation with Maloney to 17 mm.  Otherwise normal exam   Upper GI series November 2012: Small sliding-type hiatal hernia with moderate inducible gastroesophageal reflux.  Mild smooth narrowing at the GE junction likely a lower esophageal mucosal ring.  30 mm barium pill passed into stomach without any difficulty.  Normal appearance of stomach and duodenum.   Esophageal manometry February 2019: Normal   24-hour pH impedance on PPI and H2 blocker February 2019: Good  acid suppression with DeMeester score 7.2, very few gastroesophageal reflux events , were predominantly weekly acid reflux.  Good symptom correlation for regurgitation with reflux events but no correlation for cough.   Negative Cologuard April 18, 2015   Current Outpatient Medications:    Accu-Chek Softclix Lancets lancets, Use as instructed, Disp: 100 each, Rfl: 12   acetaminophen (TYLENOL) 500 MG tablet, Take 1 tablet (500 mg total) by mouth every 6 (six) hours as needed (pain)., Disp: 30 tablet, Rfl: 0   albuterol (VENTOLIN HFA) 108 (90 Base) MCG/ACT inhaler, TAKE 2 PUFFS BY MOUTH EVERY 6 HOURS AS NEEDED FOR WHEEZE OR SHORTNESS OF BREATH (Patient taking differently: Inhale 2 puffs into the lungs every 6 (six) hours as needed for wheezing or shortness of breath. TAKE 2 PUFFS BY MOUTH EVERY 6 HOURS AS NEEDED FOR WHEEZE OR SHORTNESS OF BREATH), Disp: 18 each, Rfl: 2   amLODipine (NORVASC) 10 MG tablet, Take 1 tablet (10 mg total) by mouth daily. (Patient taking differently: Take 10 mg by mouth daily.), Disp: 90 tablet, Rfl: 3   aspirin 81 MG tablet, Take 81 mg by mouth daily., Disp: , Rfl:    benzonatate (TESSALON) 200 MG capsule, Take 1 capsule (200 mg total) by mouth 3 (three) times daily as needed for cough. (Patient taking differently: Take 200 mg by mouth 3 (three) times daily as needed for cough.), Disp: 30 capsule, Rfl: 1   benzonatate (TESSALON) 200 MG capsule, Take 1 capsule (200 mg total) by mouth  3 (three) times daily as needed for cough., Disp: 30 capsule, Rfl: 5   blood glucose meter kit and supplies, Dispense based on patient and insurance preference. Use qd and prn for uncontrolled diabetes. (FOR ICD-10 E10.9, E11.9)., Disp: 1 each, Rfl: 1   Budeson-Glycopyrrol-Formoterol (BREZTRI AEROSPHERE) 160-9-4.8 MCG/ACT AERO, Inhale 2 puffs then rinse mouth, twice daily, Disp: 32.1 g, Rfl: 4   Calcium 250 MG CAPS, Take 500 mg by mouth at bedtime., Disp: , Rfl:    chlorpheniramine  (CHLOR-TRIMETON) 4 MG tablet, Take 4 mg by mouth in the morning, at noon, and at bedtime., Disp: , Rfl:    chlorthalidone (HYGROTON) 25 MG tablet, Take 0.5 tablets (12.5 mg total) by mouth daily. (Patient taking differently: Take 12.5 mg by mouth daily.), Disp: 1 tablet, Rfl: 0   esomeprazole (NEXIUM) 40 MG capsule, 1 capsule by mouth every day before breakfast (Patient taking differently: Take 40 mg by mouth daily. 1 capsule by mouth every day before breakfast), Disp: 90 capsule, Rfl: 6   estradiol (ESTRACE) 0.1 MG/GM vaginal cream, Place 0.5g with fingertip on urethra and vaginal opening for two weeks then twice a week after, Disp: 42.5 g, Rfl: 1   famotidine (PEPCID) 20 MG tablet, TAKE 1 TABLET BY MOUTH EVERYDAY AT BEDTIME, Disp: 90 tablet, Rfl: 1   ferrous sulfate 325 (65 FE) MG EC tablet, Take 325 mg by mouth every Monday, Wednesday, and Friday. Takes in am, Disp: , Rfl:    Garlic (GARLIQUE PO), Take 1 tablet by mouth daily., Disp: , Rfl:    glucose blood test strip, Use as instructed, Disp: 100 each, Rfl: 12   levothyroxine (SYNTHROID) 100 MCG tablet, TAKE 1 TABLET BY MOUTH EVERY DAY BEFORE BREAKFAST (Patient taking differently: Take 100 mcg by mouth daily before breakfast.), Disp: 90 tablet, Rfl: 2   MAGNESIUM LACTATE PO, Take 2 tablets by mouth at bedtime. 210 mg each, Disp: , Rfl:    metFORMIN (GLUCOPHAGE) 1000 MG tablet, Take 1 tablet (1,000 mg total) by mouth 2 (two) times daily with a meal. (Patient taking differently: Take 1,000 mg by mouth 2 (two) times daily with a meal.), Disp: 180 tablet, Rfl: 3   montelukast (SINGULAIR) 10 MG tablet, TAKE 1 TABLET BY MOUTH EVERYDAY AT BEDTIME (Patient taking differently: Take 10 mg by mouth at bedtime.), Disp: 90 tablet, Rfl: 4   Multiple Vitamins-Minerals (EYE HEALTH) CAPS, Take 1 capsule by mouth daily., Disp: , Rfl:    Nutritional Supplements (GLUCOSE MANAGEMENT PO), Take by mouth., Disp: , Rfl:    nystatin-triamcinolone (MYCOLOG II) cream, Apply 1  Application topically as needed., Disp: , Rfl:    Omega-3 Fatty Acids (OMEGA 3 500 PO), Take 1 capsule by mouth in the morning and at bedtime., Disp: , Rfl:    polyethylene glycol (MIRALAX / GLYCOLAX) packet, Take 17 g by mouth 2 (two) times a week., Disp: , Rfl:    polyethylene glycol powder (GLYCOLAX/MIRALAX) 17 GM/SCOOP powder, Take 17 g by mouth daily. Drink 17g (1 scoop) dissolved in water per day., Disp: 255 g, Rfl: 0   Polyvinyl Alcohol-Povidone (REFRESH OP), Place 1 drop into both eyes daily. , Disp: , Rfl:    valsartan (DIOVAN) 320 MG tablet, TAKE 1 TABLET BY MOUTH EVERY DAY (Patient taking differently: Take 320 mg by mouth daily.), Disp: 90 tablet, Rfl: 3   Vitamin D-Vitamin K (VITAMIN K2-VITAMIN D3 PO), Take 1 capsule by mouth daily with lunch. D3 2000iu/  K2500, Disp: , Rfl:    vitamin  E 200 UNIT capsule, Take 400 Units by mouth daily with lunch., Disp: , Rfl:    methocarbamol (ROBAXIN-750) 750 MG tablet, Take 1 tablet (750 mg total) by mouth 4 (four) times daily. (Patient not taking: Reported on 06/15/2022), Disp: 20 tablet, Rfl: 0    Allergies as of 06/15/2022 - Review Complete 06/15/2022  Allergen Reaction Noted   Ace inhibitors Cough 05/19/2011   Crestor [rosuvastatin] Other (See Comments) 01/24/2014   Wellbutrin [bupropion hcl] Other (See Comments) 05/19/2011   Cymbalta [duloxetine hcl] Palpitations and Other (See Comments) 05/19/2011   Lipitor [atorvastatin] Cough 03/26/2016   Pregabalin Other (See Comments) 05/19/2011   Tegretol [carbamazepine] Other (See Comments) 05/19/2011    Past Medical History:  Diagnosis Date   Allergic rhinitis    Arthritis    hands and knees   Chronic constipation    Environmental and seasonal allergies    GERD (gastroesophageal reflux disease)    [name] EGD - mild esophageal dysmotility and small hiatal hernia   Hiatal hernia    History of esophageal stricture 2013   s/p dilatation   History of vertebral compression fracture 07/18/2014    S/p kyphoplasty T12, T7 and T8   Hyperlipidemia    mild, diet controlled   Hypertension    Hypothyroidism    Macular degeneration of both eyes    Migraines    Moderate persistent asthma without complication    pulmonologist--- dr c. young   Osteoporosis 07/24/2014   w/  hx compression fx  T12,  T7 and T8 s/p kyphoplasty   Prolapse of vaginal walls    anterior and posterior   Type 2 diabetes mellitus    followed by pcp   (04-15-2022  pt stated she check blood sugar daily in am fasting,  average 106--117)   Uterovaginal prolapse, incomplete    Wears glasses     Past Surgical History:  Procedure Laterality Date   76 HOUR PH STUDY N/A 04/11/2017   Procedure: 24 HOUR PH STUDY;  Surgeon: Napoleon Form, MD;  Location: [facility];  Service: Endoscopy;  Laterality: N/A;   CATARACT EXTRACTION W/ INTRAOCULAR LENS IMPLANT Bilateral 2003   COLONOSCOPY  2017   COLPOCLEISIS N/A 04/19/2022   Procedure: Ignatius Specking COLPOCLEISIS;  Surgeon: Marguerita Beards, MD;  Location[facility];  Service: Gynecology;  Laterality: N/A;   CYSTOSCOPY N/A 04/19/2022   Procedure: CYSTOSCOPY;  Surgeon: Marguerita Beards, MD;  Location: [facility];  Service: Gynecology;  Laterality: N/A;   DILATION AND CURETTAGE OF UTERUS  1978   ESOPHAGEAL MANOMETRY N/A 04/11/2017   Procedure: ESOPHAGEAL MANOMETRY (EM);  Surgeon: Napoleon Form, MD;  Location: [facility];  Service: Endoscopy;  Laterality: N/A;   ESOPHAGOGASTRODUODENOSCOPY  06/25/2011   Procedure: ESOPHAGOGASTRODUODENOSCOPY (EGD);  Surgeon: Hart Carwin, MD;  Location: [facility];  Service: Endoscopy;  Laterality: N/A;  no Xray   KYPHOPLASTY N/A 08/30/2014   Procedure: THORACIC TWELVE KYPHOPLASTY;  Surgeon: [name], MD   KYPHOPLASTY N/A 01/22/2021   Procedure: Thoracic eight, Thoracic nine KYPHOPLASTY;  Surgeon: Coletta Memos, MD;  Location: [facility];  Service: Neurosurgery;  Laterality: N/A;    LAPAROSCOPY ABDOMEN DIAGNOSTIC     yrs ago;    To R/O endometriosis   PH IMPEDANCE STUDY N/A 04/11/2017   Procedure: PH IMPEDANCE STUDY;  Surgeon: Napoleon Form, MD;  Location: [facility];  Service: Endoscopy;  Laterality: N/A;   RECTOCELE REPAIR N/A 04/19/2022   Procedure: Levator plication with perineorrhaphy;  Surgeon: Marguerita Beards, MD;  Location[facility];  Service: Gynecology;  Laterality: N/A;   SAVORY DILATION  06/25/2011   Procedure: SAVORY DILATION;  Surgeon: Hart Carwin, MD;  Location: [facility];  Service: Endoscopy;  Laterality: N/A;   TONSILLECTOMY AND ADENOIDECTOMY  1962    Family History  Problem Relation Age of Onset   Mitral valve prolapse Father    Diabetes Father    Mitral valve prolapse Mother    Hypertension Mother    Stroke Mother 27       after valve surgery   Parkinson's disease Brother    Other Brother        POST WAR TRAUMA   Stroke Paternal Grandfather    Heart failure Maternal Grandfather    Cancer Neg Hx    Colon cancer Neg Hx    Stomach cancer Neg Hx    Esophageal cancer Neg Hx    Pancreatic cancer Neg Hx    Liver disease Neg Hx     Social History   Socioeconomic History   Marital status: Widowed    Spouse name: Not on file   Number of children: 3   Years of education: Not on file   Highest education level: Not on file  Occupational History   Occupation: Retired  Tobacco Use   Smoking status: Never    Passive exposure: Never   Smokeless tobacco: Never  Vaping Use   Vaping Use: Never used  Substance and Sexual Activity   Alcohol use: No    Alcohol/week: 0.0 standard drinks of alcohol   Drug use: Never   Sexual activity: Not Currently  Other Topics Concern   Not on file  Social History Narrative   Caffeine: occasional coffee   Lives alone, widower, 1 cat   Occupation: retired, 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   Act: works 3d/wk Chemical engineer), lives in [place], gardens, walks   Diet: good  water, fruits/vegetables daily      HCPOA is Elenore Wanninger [place] [phone] (cell)   Social Determinants of Health   Financial Resource Strain: Low Risk  (03/15/2022)   Overall Financial Resource Strain (CARDIA)    Difficulty of Paying Living Expenses: Not hard at all  Food Insecurity: No Food Insecurity (03/15/2022)   Hunger Vital Sign    Worried About Running Out of Food in the Last Year: Never true    Ran Out of Food in the Last Year: Never true  Transportation Needs: No Transportation Needs (03/15/2022)   PRAPARE - Administrator, Civil Service (Medical): No    Lack of Transportation (Non-Medical): No  Physical Activity: Insufficiently Active (03/15/2022)   Exercise Vital Sign    Days of Exercise per Week: 5 days    Minutes of Exercise per Session: 20 min  Stress: No Stress Concern Present (03/15/2022)   [employer] of Occupational Health - Occupational Stress Questionnaire    Feeling of Stress : Not at all  Social Connections: Socially Isolated (03/15/2022)   Social Connection and Isolation Panel [NHANES]    Frequency of Communication with Friends and Family: More than three times a week    Frequency of Social Gatherings with Friends and Family: Once a week    Attends Religious Services: Never    Database administrator or Organizations: No    Attends Banker Meetings: Never    Marital Status: Widowed  Intimate Partner Violence: Not At Risk (03/15/2022)  Humiliation, Afraid, Rape, and Kick questionnaire    Fear of Current or Ex-Partner: No    Emotionally Abused: No    Physically Abused: No    Sexually Abused: No    Review of systems: Review of Systems  Constitutional:  Negative for unexpected weight change.  HENT:  Negative for trouble swallowing.   Gastrointestinal:  Positive for diarrhea. Negative for abdominal distention, abdominal pain, anal bleeding, blood in stool, constipation, nausea, rectal pain and vomiting.   Physical  Exam: General: well-appearing   Eyes: sclera anicteric, no redness GI: soft, no tenderness, with active bowel sounds. No guarding or palpable organomegaly noted. Skin; warm and dry, no rash or jaundice noted Neuro: awake, alert and oriented x 3. Normal gross motor function and fluent speech  Data Reviewed:  Reviewed labs, radiology imaging, old records and pertinent past GI work up   Assessment and Plan/Recommendations:  82 year old very pleasant female with history of chronic GERD and chronic cough Overall GERD symptoms are stable on current regimen of Nexium daily in the morning and Pepcid at bedtime, she is following antireflux measures She is followed by pulmonary for chronic cough and overall her symptoms are manageable  IBS constipation: Use MiraLAX as needed to avoid constipation, hold if has diarrhea   Return in 2 year or sooner if needed   The patient was provided an opportunity to ask questions and all were answered. The patient agreed with the plan and demonstrated an understanding of the instructions.  Iona Beard , MD    CC: Tower, Audrie Gallus, MD  Ladona Mow Hewitt Shorts as a scribe for Marsa Aris, MD.,have documented all relevant documentation on the behalf of Marsa Aris, MD,as directed by  Marsa Aris, MD while in the presence of Marsa Aris, MD.   I, Marsa Aris, MD, have reviewed all documentation for this visit. The documentation on 06/15/22 for the exam, diagnosis, procedures, and orders are all accurate and complete.

## 2022-06-14 DIAGNOSIS — S233XXA Sprain of ligaments of thoracic spine, initial encounter: Secondary | ICD-10-CM | POA: Diagnosis not present

## 2022-06-14 DIAGNOSIS — M9902 Segmental and somatic dysfunction of thoracic region: Secondary | ICD-10-CM | POA: Diagnosis not present

## 2022-06-15 ENCOUNTER — Ambulatory Visit: Payer: Medicare PPO | Admitting: Gastroenterology

## 2022-06-15 ENCOUNTER — Encounter: Payer: Self-pay | Admitting: Gastroenterology

## 2022-06-15 VITALS — Ht 67.0 in | Wt 154.0 lb

## 2022-06-15 DIAGNOSIS — K581 Irritable bowel syndrome with constipation: Secondary | ICD-10-CM | POA: Diagnosis not present

## 2022-06-15 DIAGNOSIS — K219 Gastro-esophageal reflux disease without esophagitis: Secondary | ICD-10-CM

## 2022-06-15 MED ORDER — FAMOTIDINE 20 MG PO TABS: ORAL_TABLET | ORAL | 3 refills | Status: DC

## 2022-06-15 MED ORDER — ESOMEPRAZOLE MAGNESIUM 40 MG PO CPDR: DELAYED_RELEASE_CAPSULE | ORAL | 6 refills | Status: DC

## 2022-06-15 NOTE — Patient Instructions (Signed)
We have sent the following medications to your pharmacy for you to pick up at your convenience: Pepcid  Nexium  Follow up in 2 years  _______________________________________________________  If your blood pressure at your visit was 140/90 or greater, please contact your primary care physician to follow up on this.  _______________________________________________________  If you are age 82 or older, your body mass index should be between 23-30. Your Body mass index is 24.12 kg/m. If this is out of the aforementioned range listed, please consider follow up with your Primary Care Provider.  If you are age 68 or younger, your body mass index should be between 19-25. Your Body mass index is 24.12 kg/m. If this is out of the aformentioned range listed, please consider follow up with your Primary Care Provider.   ________________________________________________________  The [facility] providers would like to encourage you to use [facility] to communicate with providers for non-urgent requests or questions.  Due to long hold times on the telephone, sending your provider a message by [facility] may be a faster and more efficient way to get a response.  Please allow 48 business hours for a response.  Please remember that this is for non-urgent requests.  _______________________________________________________ I appreciate the  opportunity to care for you  Thank You   Marsa Aris , MD

## 2022-06-16 ENCOUNTER — Encounter: Payer: Self-pay | Admitting: Gastroenterology

## 2022-06-24 DIAGNOSIS — H40013 Open angle with borderline findings, low risk, bilateral: Secondary | ICD-10-CM | POA: Diagnosis not present

## 2022-06-28 DIAGNOSIS — S233XXA Sprain of ligaments of thoracic spine, initial encounter: Secondary | ICD-10-CM | POA: Diagnosis not present

## 2022-06-28 DIAGNOSIS — M9902 Segmental and somatic dysfunction of thoracic region: Secondary | ICD-10-CM | POA: Diagnosis not present

## 2022-07-04 ENCOUNTER — Encounter: Payer: Self-pay | Admitting: Internal Medicine

## 2022-07-04 NOTE — Assessment & Plan Note (Signed)
Sized reflux precautions and careful swallowing. Plan-refill Tessalon but encouraged her to follow with GI.

## 2022-07-04 NOTE — Assessment & Plan Note (Signed)
Appropriate to continue maintenance bronchodilator

## 2022-07-08 ENCOUNTER — Encounter: Payer: Self-pay | Admitting: Family Medicine

## 2022-07-08 NOTE — Telephone Encounter (Signed)
Called pt and advised her she will need an appt to eval sxs. Pt declined appt tomorrow or Monday she said she's "busy" appt scheduled next week to eval sxs. Pt doesn't have a way to check BP her machine stopped working. Pt is going to get a new BP cuff and I advise her to call back if BP is elevated. Pt also said she has no other sxs. No chest pain/pressure, no HA, no fever just the leg swelling, weight loss and weakness. Pt advised if sxs worsen before appt to call us back, ER precautions also given to pt  FYI to PCP

## 2022-07-12 DIAGNOSIS — S233XXA Sprain of ligaments of thoracic spine, initial encounter: Secondary | ICD-10-CM | POA: Diagnosis not present

## 2022-07-12 DIAGNOSIS — M9902 Segmental and somatic dysfunction of thoracic region: Secondary | ICD-10-CM | POA: Diagnosis not present

## 2022-07-14 ENCOUNTER — Ambulatory Visit: Payer: Medicare PPO | Admitting: Family Medicine

## 2022-07-17 ENCOUNTER — Other Ambulatory Visit: Payer: Self-pay | Admitting: Family Medicine

## 2022-07-17 DIAGNOSIS — E039 Hypothyroidism, unspecified: Secondary | ICD-10-CM

## 2022-08-02 DIAGNOSIS — M9902 Segmental and somatic dysfunction of thoracic region: Secondary | ICD-10-CM | POA: Diagnosis not present

## 2022-08-02 DIAGNOSIS — S233XXA Sprain of ligaments of thoracic spine, initial encounter: Secondary | ICD-10-CM | POA: Diagnosis not present

## 2022-08-16 DIAGNOSIS — M9902 Segmental and somatic dysfunction of thoracic region: Secondary | ICD-10-CM | POA: Diagnosis not present

## 2022-08-16 DIAGNOSIS — S233XXA Sprain of ligaments of thoracic spine, initial encounter: Secondary | ICD-10-CM | POA: Diagnosis not present

## 2022-08-25 ENCOUNTER — Encounter: Payer: Self-pay | Admitting: Family Medicine

## 2022-08-25 ENCOUNTER — Ambulatory Visit: Payer: Medicare PPO | Admitting: Family Medicine

## 2022-08-25 VITALS — BP 100/64 | HR 82 | Temp 97.8°F | Ht 67.0 in | Wt 152.4 lb

## 2022-08-25 DIAGNOSIS — E039 Hypothyroidism, unspecified: Secondary | ICD-10-CM

## 2022-08-25 DIAGNOSIS — E119 Type 2 diabetes mellitus without complications: Secondary | ICD-10-CM

## 2022-08-25 DIAGNOSIS — I10 Essential (primary) hypertension: Secondary | ICD-10-CM | POA: Diagnosis not present

## 2022-08-25 DIAGNOSIS — R634 Abnormal weight loss: Secondary | ICD-10-CM | POA: Diagnosis not present

## 2022-08-25 DIAGNOSIS — Z7984 Long term (current) use of oral hypoglycemic drugs: Secondary | ICD-10-CM | POA: Diagnosis not present

## 2022-08-25 DIAGNOSIS — R432 Parageusia: Secondary | ICD-10-CM

## 2022-08-25 LAB — CBC WITH DIFFERENTIAL/PLATELET
Basophils Absolute: 0 10*3/uL (ref 0.0–0.1)
Basophils Relative: 0.5 % (ref 0.0–3.0)
Eosinophils Absolute: 0.1 10*3/uL (ref 0.0–0.7)
Eosinophils Relative: 1.2 % (ref 0.0–5.0)
HCT: 38 % (ref 36.0–46.0)
Hemoglobin: 12.6 g/dL (ref 12.0–15.0)
Lymphocytes Relative: 41.8 % (ref 12.0–46.0)
Lymphs Abs: 2.9 10*3/uL (ref 0.7–4.0)
MCHC: 33.1 g/dL (ref 30.0–36.0)
MCV: 92.3 fl (ref 78.0–100.0)
Monocytes Absolute: 0.4 10*3/uL (ref 0.1–1.0)
Monocytes Relative: 5.9 % (ref 3.0–12.0)
Neutro Abs: 3.4 10*3/uL (ref 1.4–7.7)
Neutrophils Relative %: 50.6 % (ref 43.0–77.0)
Platelets: 213 10*3/uL (ref 150.0–400.0)
RBC: 4.12 Mil/uL (ref 3.87–5.11)
RDW: 12.9 % (ref 11.5–15.5)
WBC: 6.8 10*3/uL (ref 4.0–10.5)

## 2022-08-25 LAB — COMPREHENSIVE METABOLIC PANEL
ALT: 25 U/L (ref 0–35)
AST: 24 U/L (ref 0–37)
Albumin: 4.6 g/dL (ref 3.5–5.2)
Alkaline Phosphatase: 54 U/L (ref 39–117)
BUN: 24 mg/dL — ABNORMAL HIGH (ref 6–23)
CO2: 30 mEq/L (ref 19–32)
Calcium: 10 mg/dL (ref 8.4–10.5)
Chloride: 95 mEq/L — ABNORMAL LOW (ref 96–112)
Creatinine, Ser: 0.79 mg/dL (ref 0.40–1.20)
GFR: 69.94 mL/min (ref >60.00)
Glucose, Bld: 120 mg/dL — ABNORMAL HIGH (ref 70–99)
Potassium: 4.3 mEq/L (ref 3.5–5.1)
Sodium: 134 mEq/L — ABNORMAL LOW (ref 135–145)
Total Bilirubin: 0.4 mg/dL (ref 0.2–1.2)
Total Protein: 7.3 g/dL (ref 6.0–8.3)

## 2022-08-25 LAB — HEMOGLOBIN A1C: Hgb A1c MFr Bld: 6.6 % — ABNORMAL HIGH (ref 4.6–6.5)

## 2022-08-25 LAB — TSH: TSH: 3.87 u[IU]/mL (ref 0.35–5.50)

## 2022-08-25 NOTE — Progress Notes (Unsigned)
Subjective:    Patient ID: Jordan Patterson, female    DOB: Apr 26, 1940, 82 y.o.   MRN: [id number]  HPI  Wt Readings from Last 3 Encounters:  08/25/22 152 lb 6.4 oz (69.1 kg)  06/15/22 154 lb (69.9 kg)  06/03/22 159 lb (72.1 kg)   23.87 kg/m  Vitals:   08/25/22 1030  BP: 100/64  Pulse: 82  Temp: 97.8 F (36.6 C)  SpO2: 90%    Pt presents with unexpected weight loss  Losing weight and losing muscle mass  Peak weight was 167?   (One of the readings was 186 in [place] but unsure if that is accurate)   Not trying to loose   Eating differently without family - 2 good meals per day  Possibly less calories  No appetite because no taste or smell since 2 year   In summer time walks every am  Drinks protein drink every am  30 g of protein    Hypothyroid Lab Results  Component Value Date   TSH 5.35 08/13/2021   Levothyroxine 100 mcg daily    DM2 Metformin 1000 mg bid  Lab Results  Component Value Date   HGBA1C 6.6 (H) 03/24/2022     Mood     08/25/2022   10:35 AM 03/31/2022   11:01 AM 03/15/2022   11:25 AM 08/13/2021    8:07 AM 03/13/2021   11:23 AM  Depression screen PHQ 2/9  Decreased Interest 0 0 0 0 0  Down, Depressed, Hopeless 0 0 0 0 0  PHQ - 2 Score 0 0 0 0 0  Altered sleeping 0 1     Tired, decreased energy 0 0     Change in appetite 3 0     Feeling bad or failure about yourself  0 0     Trouble concentrating 0 0     Moving slowly or fidgety/restless 0 0     Suicidal thoughts 0 0     PHQ-9 Score 3 1     Difficult doing work/chores Not difficult at all Not difficult at all        Patient Active Problem List   Diagnosis Date Noted   Preoperative respiratory examination 04/05/2022   Acute cystitis 02/11/2022   Frequent urination 02/09/2022   Constipation 12/14/2021   Preoperative cardiovascular examination 11/09/2021   Cough 11/04/2021   Digital mucinous cyst of finger 08/13/2021   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   Vaginal spotting 04/07/2021    Estrogen deficiency 04/07/2021   Thoracic compression fracture (HCC) 01/22/2021   Compression fracture of T11 vertebra (HCC) 12/15/2020   Current use of proton pump inhibitor 03/11/2020   Chronic midline low back pain without sciatica 03/12/2019   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   Hyponatremia 12/14/2018   Laryngopharyngeal reflux (LPR) 08/24/2017   Osteoporosis 08/24/2017   Leg cramping 08/21/2017   Regurgitation of food    Prolapse of female pelvic organs 02/19/2017   History of compression fracture of spine 07/18/2014   Hypertensive retinopathy of both eyes 06/04/2014   Nonexudative age-related macular degeneration 06/04/2014   Posterior vitreous detachment of both eyes 06/04/2014   Pseudophakia of both eyes 06/04/2014   Right knee pain 05/30/2014   Advanced care planning/counseling discussion 01/24/2014   Health maintenance examination 01/24/2014   Dysgeusia 11/12/2013   Upper airway cough syndrome 03/19/2013   Allergic conjunctivitis and rhinitis 05/15/2012   Asthma, moderate persistent 03/09/2012   Medicare annual wellness visit, subsequent 01/14/2012   Hiatal hernia  GERD (gastroesophageal reflux disease)    Essential hypertension    Hyperlipidemia associated with type 2 diabetes mellitus (HCC)    Depression    Hypothyroidism    Diabetes type 2, controlled (HCC)    Osteoarthritis    Past Medical History:  Diagnosis Date   Allergic rhinitis    Arthritis    hands and knees   Chronic constipation    Environmental and seasonal allergies    GERD (gastroesophageal reflux disease)    [name] EGD - mild esophageal dysmotility and small hiatal hernia   Hiatal hernia    History of esophageal stricture 2013   s/p dilatation   History of vertebral compression fracture 07/18/2014   S/p kyphoplasty T12, T7 and T8   Hyperlipidemia    mild, diet controlled   Hypertension    Hypothyroidism    Macular degeneration of both eyes    Migraines    Moderate persistent  asthma without complication    pulmonologist--- dr c. young   Osteoporosis 07/24/2014   w/  hx compression fx  T12,  T7 and T8 s/p kyphoplasty   Prolapse of vaginal walls    anterior and posterior   Type 2 diabetes mellitus (HCC)    followed by pcp   (04-15-2022  pt stated she check blood sugar daily in am fasting,  average 106--117)   Uterovaginal prolapse, incomplete    Wears glasses    Past Surgical History:  Procedure Laterality Date   4 HOUR PH STUDY N/A 04/11/2017   Procedure: 24 HOUR PH STUDY;  Surgeon: Napoleon Form, MD;  Location: [facility];  Service: Endoscopy;  Laterality: N/A;   CATARACT EXTRACTION W/ INTRAOCULAR LENS IMPLANT Bilateral 2003   COLONOSCOPY  2017   COLPOCLEISIS N/A 04/19/2022   Procedure: Ignatius Specking COLPOCLEISIS;  Surgeon: Marguerita Beards, MD;  Location: [facility];  Service: Gynecology;  Laterality: N/A;   CYSTOSCOPY N/A 04/19/2022   Procedure: CYSTOSCOPY;  Surgeon: Marguerita Beards, MD;  Location: [facility];  Service: Gynecology;  Laterality: N/A;   DILATION AND CURETTAGE OF UTERUS  1978   ESOPHAGEAL MANOMETRY N/A 04/11/2017   Procedure: ESOPHAGEAL MANOMETRY (EM);  Surgeon: Napoleon Form, MD;  Location: [facility];  Service: Endoscopy;  Laterality: N/A;   ESOPHAGOGASTRODUODENOSCOPY  06/25/2011   Procedure: ESOPHAGOGASTRODUODENOSCOPY (EGD);  Surgeon: Hart Carwin, MD;  Location: [facility];  Service: Endoscopy;  Laterality: N/A;  no Xray   KYPHOPLASTY N/A 08/30/2014   Procedure: THORACIC TWELVE KYPHOPLASTY;  Surgeon: [name], MD   KYPHOPLASTY N/A 01/22/2021   Procedure: Thoracic eight, Thoracic nine KYPHOPLASTY;  Surgeon: Coletta Memos, MD;  Location: [facility];  Service: Neurosurgery;  Laterality: N/A;   LAPAROSCOPY ABDOMEN DIAGNOSTIC     yrs ago;    To R/O endometriosis   PH IMPEDANCE STUDY N/A 04/11/2017   Procedure: PH IMPEDANCE STUDY;  Surgeon: Napoleon Form, MD;  Location: [facility];  Service: Endoscopy;  Laterality: N/A;   RECTOCELE REPAIR N/A 04/19/2022   Procedure: Levator plication with perineorrhaphy;  Surgeon: Marguerita Beards, MD;  Location: [facility];  Service: Gynecology;  Laterality: N/A;   SAVORY DILATION  06/25/2011   Procedure: SAVORY DILATION;  Surgeon: Hart Carwin, MD;  Location: [facility];  Service: Endoscopy;  Laterality: N/A;   TONSILLECTOMY AND ADENOIDECTOMY  1962   Social History   Tobacco Use   Smoking status: Never    Passive exposure: Never   Smokeless tobacco: Never  Vaping Use   Vaping Use: Never used  Substance Use Topics   Alcohol use: No    Alcohol/week: 0.0 standard drinks of alcohol   Drug use: Never   Family History  Problem Relation Age of Onset   Mitral valve prolapse Father    Diabetes Father    Mitral valve prolapse Mother    Hypertension Mother    Stroke Mother 7       after valve surgery   Parkinson's disease Brother    Other Brother        POST WAR TRAUMA   Stroke Paternal Grandfather    Heart failure Maternal Grandfather    Cancer Neg Hx    Colon cancer Neg Hx    Stomach cancer Neg Hx    Esophageal cancer Neg Hx    Pancreatic cancer Neg Hx    Liver disease Neg Hx    Allergies  Allergen Reactions   Ace Inhibitors Cough   Crestor [Rosuvastatin] Other (See Comments)    myalgias   Wellbutrin [Bupropion Hcl] Other (See Comments)    Unknown reaction   Cymbalta [Duloxetine Hcl] Palpitations and Other (See Comments)    headaches   Lipitor [Atorvastatin] Cough   Pregabalin Other (See Comments)    Unknown reaction   Tegretol [Carbamazepine] Other (See Comments)    Dizziness, headache   Current Outpatient Medications on File Prior to Visit  Medication Sig Dispense Refill   Accu-Chek Softclix Lancets lancets Use as instructed 100 each 12   acetaminophen (TYLENOL) 500 MG tablet Take 1 tablet (500 mg total) by mouth every 6 (six) hours as needed (pain). 30 tablet 0    albuterol (VENTOLIN HFA) 108 (90 Base) MCG/ACT inhaler TAKE 2 PUFFS BY MOUTH EVERY 6 HOURS AS NEEDED FOR WHEEZE OR SHORTNESS OF BREATH (Patient taking differently: Inhale 2 puffs into the lungs every 6 (six) hours as needed for wheezing or shortness of breath. TAKE 2 PUFFS BY MOUTH EVERY 6 HOURS AS NEEDED FOR WHEEZE OR SHORTNESS OF BREATH) 18 each 2   amLODipine (NORVASC) 10 MG tablet Take 1 tablet (10 mg total) by mouth daily. (Patient taking differently: Take 10 mg by mouth daily.) 90 tablet 3   aspirin 81 MG tablet Take 81 mg by mouth daily.     benzonatate (TESSALON) 200 MG capsule Take 1 capsule (200 mg total) by mouth 3 (three) times daily as needed for cough. (Patient taking differently: Take 200 mg by mouth 3 (three) times daily as needed for cough.) 30 capsule 1   blood glucose meter kit and supplies Dispense based on patient and insurance preference. Use qd and prn for uncontrolled diabetes. (FOR ICD-10 E10.9, E11.9). 1 each 1   Budeson-Glycopyrrol-Formoterol (BREZTRI AEROSPHERE) 160-9-4.8 MCG/ACT AERO Inhale 2 puffs then rinse mouth, twice daily 32.1 g 4   Calcium 250 MG CAPS Take 500 mg by mouth at bedtime.     chlorpheniramine (CHLOR-TRIMETON) 4 MG tablet Take 4 mg by mouth in the morning, at noon, and at bedtime.     chlorthalidone (HYGROTON) 25 MG tablet Take 0.5 tablets (12.5 mg total) by mouth daily. (Patient taking differently: Take 12.5 mg by mouth daily.) 1 tablet 0   esomeprazole (NEXIUM) 40 MG capsule 1 capsule by mouth every day before breakfast 90 capsule 6   estradiol (ESTRACE) 0.1 MG/GM vaginal cream Place 0.5g with fingertip on urethra and vaginal opening for two weeks then twice a week after 42.5 g 1   famotidine (PEPCID) 20 MG tablet TAKE  1 TABLET BY MOUTH EVERYDAY AT BEDTIME 90 tablet 3   ferrous sulfate 325 (65 FE) MG EC tablet Take 325 mg by mouth every Monday, Wednesday, and Friday. Takes in am     Garlic (GARLIQUE PO) Take 1 tablet by mouth daily.     glucose blood  test strip Use as instructed 100 each 12   levothyroxine (SYNTHROID) 100 MCG tablet TAKE 1 TABLET BY MOUTH EVERY DAY BEFORE BREAKFAST 90 tablet 0   MAGNESIUM LACTATE PO Take 2 tablets by mouth at bedtime. 210 mg each     metFORMIN (GLUCOPHAGE) 1000 MG tablet Take 1 tablet (1,000 mg total) by mouth 2 (two) times daily with a meal. (Patient taking differently: Take 1,000 mg by mouth 2 (two) times daily with a meal.) 180 tablet 3   Multiple Vitamins-Minerals (EYE HEALTH) CAPS Take 1 capsule by mouth daily.     Nutritional Supplements (GLUCOSE MANAGEMENT PO) Take by mouth.     nystatin-triamcinolone (MYCOLOG II) cream Apply 1 Application topically as needed.     Omega-3 Fatty Acids (OMEGA 3 500 PO) Take 1 capsule by mouth in the morning and at bedtime.     polyethylene glycol (MIRALAX / GLYCOLAX) packet Take 17 g by mouth 2 (two) times a week.     polyethylene glycol powder (GLYCOLAX/MIRALAX) 17 GM/SCOOP powder Take 17 g by mouth daily. Drink 17g (1 scoop) dissolved in water per day. 255 g 0   Polyvinyl Alcohol-Povidone (REFRESH OP) Place 1 drop into both eyes daily.      valsartan (DIOVAN) 320 MG tablet TAKE 1 TABLET BY MOUTH EVERY DAY (Patient taking differently: Take 320 mg by mouth daily.) 90 tablet 3   Vitamin D-Vitamin K (VITAMIN K2-VITAMIN D3 PO) Take 1 capsule by mouth daily with lunch. D3 2000iu/  K2500     vitamin E 200 UNIT capsule Take 400 Units by mouth daily with lunch.     montelukast (SINGULAIR) 10 MG tablet TAKE 1 TABLET BY MOUTH EVERYDAY AT BEDTIME (Patient taking differently: Take 10 mg by mouth at bedtime.) 90 tablet 4   No current facility-administered medications on file prior to visit.    Review of Systems     Objective:   Physical Exam        Assessment & Plan:   Problem List Items Addressed This Visit   None

## 2022-08-25 NOTE — Patient Instructions (Addendum)
Try and get more protein calories  Get a portion of protein at least three times per day   Sneak in some peanut butter - a couple of tablespoons per day   Any kind of strength building exercise would help Add some strength training to your routine, this is important for bone and brain health and can reduce your risk of falls and help your body use insulin properly and regulate weight  Light weights, exercise bands , and internet videos are a good way to start  Yoga (chair or regular), machines , floor exercises or a gym with machines are also good options    Labs today for weight loss  Also thyroid function and diabetes    Take care of yourself    We will make a plan when we get results

## 2022-08-26 NOTE — Assessment & Plan Note (Signed)
A1c ordered    Tolerating higher dose of metformin 1000 mg but has lost more weight  Stable labs last time  Eye exam utd Cannot take statin  On arb  Eating better Enc to continue low glycemic diet but increase protein to prevent too much weight loss

## 2022-08-26 NOTE — Assessment & Plan Note (Signed)
TSH today  Recent weight loss noted  Taking levothyroxine 100 mcg daily

## 2022-08-26 NOTE — Assessment & Plan Note (Signed)
This no doubt has caused decreased po intake and thus weight loss  Discussed higher calorie protein snacks she can tolerated

## 2022-08-26 NOTE — Assessment & Plan Note (Addendum)
Suspect due to decreased po intake from multiple causes and also metformin dose increase  Eating less due to decreased  sense of smell and taste  Also less appetite   Labs today  Unremarkable exam  No new symptoms   Discussed adding protein calories for muscle mass and some strength building exercise as tolerated Nuts/ nut butters are a good option to supplement meals

## 2022-08-26 NOTE — Assessment & Plan Note (Signed)
BP is improved with current medicines  BP: 100/64   Chlorthalidone 12.5 mg daily  Amlodipine 10 mg daily (inc from 50  Valsartan 320 mg daily   Tolerating well  Bp improved Bmet pending   She is prepping for uro/gyn surgery later this mo

## 2022-08-30 DIAGNOSIS — S233XXA Sprain of ligaments of thoracic spine, initial encounter: Secondary | ICD-10-CM | POA: Diagnosis not present

## 2022-08-30 DIAGNOSIS — M9902 Segmental and somatic dysfunction of thoracic region: Secondary | ICD-10-CM | POA: Diagnosis not present

## 2022-09-13 DIAGNOSIS — M9902 Segmental and somatic dysfunction of thoracic region: Secondary | ICD-10-CM | POA: Diagnosis not present

## 2022-09-13 DIAGNOSIS — S233XXA Sprain of ligaments of thoracic spine, initial encounter: Secondary | ICD-10-CM | POA: Diagnosis not present

## 2022-09-19 NOTE — Progress Notes (Unsigned)
Cardiology Office Note:   Date:  09/22/2022  ID:  Jordan Patterson, DOB 26-Jul-1940, MRN [id number]  History of Present Illness:   Jordan Patterson is a 82 y.o. female with history of HTN, HLD, DMII, GERD, and esophageal stricture with prior dilation who was previously followed by Dr. Katrinka Blazing who now presents to clinic for follow-up.  Was last seen in clinic by Tereso Newcomer on 10/2021. Was seen for surgical clearance prior to surgery for vaginal prolapse. Was doing well from a CV standpoint.  Today, the patient overall feels well. States she has noticed some LE edema that is worse towards the end of the day. Otherwise, no chest pain, orthopnea or PND. Has occasional SOB when her asthma flares but no significant exertional symptoms. Has chronic cough which is unchanged. Continues to walk regularly without issue.  Blood pressure is well controlled at home but high in the MD office.   Past Medical History:  Diagnosis Date   Allergic rhinitis    Arthritis    hands and knees   Chronic constipation    Environmental and seasonal allergies    GERD (gastroesophageal reflux disease)    Jordan Patterson) EGD - mild esophageal dysmotility and small hiatal hernia   Hiatal hernia    History of esophageal stricture 2013   s/p dilatation   History of vertebral compression fracture 07/18/2014   S/p kyphoplasty T12, T7 and T8   Hyperlipidemia    mild, diet controlled   Hypertension    Hypothyroidism    Macular degeneration of both eyes    Migraines    Moderate persistent asthma without complication    pulmonologist--- dr c. young   Osteoporosis 07/24/2014   w/  hx compression fx  T12,  T7 and T8 s/p kyphoplasty   Prolapse of vaginal walls    anterior and posterior   Type 2 diabetes mellitus (HCC)    followed by pcp   (04-15-2022  pt stated she check blood sugar daily in am fasting,  average 106--117)   Uterovaginal prolapse, incomplete    Wears glasses      ROS: As per HPI  Studies Reviewed:    EKG:  No  new tracing  Cardiac Studies & Procedures       ECHOCARDIOGRAM  ECHOCARDIOGRAM COMPLETE 11/22/2014  Narrative Jordan Patterson* [address] [place], [place] [address] [phone]  ------------------------------------------------------------------- Transthoracic Echocardiography  Patient:    Jordan, Patterson MR #:       [id number] Study Date: 11/22/2014 Gender:     F Age:        70 Height:     172.7 cm Weight:     82.6 kg BSA:        2.01 m^2 Pt. Status: Room:  ATTENDING    Jordan Patterson     GYIRSWNIO, Jordan Patterson REFERRING    Jordan Patterson  Jordan Patterson, RDCS PERFORMING   [facility], Outpatient  cc:  ------------------------------------------------------------------- LV EF: 55%  ------------------------------------------------------------------- Indications:      Chest pain (R07.9).  ------------------------------------------------------------------- History:   Risk factors:  Hiatal hernia. Asthma. GERD. Hypothyroidism. Osteoporosis. Osteoarthritis. Depression. Fatigue. Cough. Dysgeusia. Hypertension. Diabetes mellitus. Dyslipidemia.  ------------------------------------------------------------------- Study Conclusions  - Left ventricle: The cavity size was normal. There was mild focal basal and mild concentric hypertrophy of the septum. Systolic function was normal. The estimated ejection fraction was 55%. Wall motion was normal; there were no regional wall motion abnormalities. Doppler parameters are consistent with abnormal left  ventricular relaxation (grade 1 diastolic dysfunction). Doppler parameters are consistent with elevated ventricular end-diastolic filling pressure. - Aortic valve: There was trivial regurgitation. - Aorta: Root not well seen bright reverbatration artifact seen in PLA veiw. - Atrial septum: No defect or patent foramen ovale was identified.  Transthoracic echocardiography.  M-mode,  complete 2D, spectral Doppler, and color Doppler.  Birthdate:  Patient birthdate: [birth year].  Age:  Patient is 82 yr old.  Sex:  Gender: female. BMI: 27.7 kg/m^2.  Blood pressure:     144/88  Patient status: Outpatient.  Study date:  Study date: 11/22/2014. Study time: 02:48 PM.  Location:  [facility]  -------------------------------------------------------------------  ------------------------------------------------------------------- Left ventricle:  The cavity size was normal. There was mild focal basal and mild concentric hypertrophy of the septum. Systolic function was normal. The estimated ejection fraction was 55%. Wall motion was normal; there were no regional wall motion abnormalities. Doppler parameters are consistent with abnormal left ventricular relaxation (grade 1 diastolic dysfunction). Doppler parameters are consistent with elevated ventricular end-diastolic filling pressure.  ------------------------------------------------------------------- Aortic valve:   Trileaflet; normal thickness, mildly calcified leaflets. Mobility was not restricted.  Doppler:  Transvalvular velocity was within the normal range. There was no stenosis. There was trivial regurgitation.  ------------------------------------------------------------------- Aorta:  Root not well seen bright reverbatration artifact seen in PLA veiw. Aortic root: The aortic root was normal in size.  ------------------------------------------------------------------- Mitral valve:   Mildly thickened leaflets . Mobility was not restricted.  Doppler:  Transvalvular velocity was within the normal range. There was no evidence for stenosis. There was trivial regurgitation.    Peak gradient (D): 4 mm Hg.  ------------------------------------------------------------------- Left atrium:  The atrium was normal in size.  ------------------------------------------------------------------- Atrial septum:  No  defect or patent foramen ovale was identified.  ------------------------------------------------------------------- Right ventricle:  The cavity size was normal. Wall thickness was normal. Systolic function was normal.  ------------------------------------------------------------------- Pulmonic valve:    Doppler:  Transvalvular velocity was within the normal range. There was no evidence for stenosis. There was trivial regurgitation.  ------------------------------------------------------------------- Tricuspid valve:   Structurally normal valve.    Doppler: Transvalvular velocity was within the normal range. There was mild regurgitation.  ------------------------------------------------------------------- Pulmonary artery:   The main pulmonary artery was normal-sized. Systolic pressure was within the normal range.  ------------------------------------------------------------------- Right atrium:  The atrium was normal in size.  ------------------------------------------------------------------- Pericardium:  The pericardium was normal in appearance. There was no pericardial effusion.  ------------------------------------------------------------------- Systemic veins: Inferior vena cava: The vessel was normal in size.  ------------------------------------------------------------------- Post procedure conclusions Ascending Aorta:  - Root not well seen bright reverbatration artifact seen in PLA veiw.  ------------------------------------------------------------------- Measurements  Left ventricle                          Value        Reference LV ID, ED, PLAX chordal          (L)    35    mm     43 - 52 LV ID, ES, PLAX chordal                 24    mm     23 - 38 LV fx shortening, PLAX chordal          31    %      >=29 LV PW thickness, ED  12    mm     --------- IVS/LV PW ratio, ED                     1            <=1.Patterson Stroke volume, 2D                        69    ml     --------- Stroke volume/bsa, 2D                   34    ml/m^2 --------- LV e&', lateral                          6.53  cm/s   --------- LV E/e&', lateral                        15.62        --------- LV e&', medial                           5.22  cm/s   --------- LV E/e&', medial                         19.54        --------- LV e&', average                          5.88  cm/s   --------- LV E/e&', average                        17.36        --------- Longitudinal strain, TDI                17    %      ---------  Ventricular septum                      Value        Reference IVS thickness, ED                       12    mm     ---------  LVOT                                    Value        Reference LVOT ID, S                              20    mm     --------- LVOT area                               Patterson.14  cm^2   --------- LVOT mean velocity, S                   65.6  cm/s   --------- LVOT VTI, S  21.9  cm     ---------  Aortic valve                            Value        Reference Aortic regurg pressure half-time        388   ms     ---------  Aorta                                   Value        Reference Aortic root ID, ED                      33    mm     ---------  Left atrium                             Value        Reference LA ID, A-P, ES                          36    mm     --------- LA ID/bsa, A-P                          1.79  cm/m^2 <=2.2 LA volume, S                            42.6  ml     --------- LA volume/bsa, S                        21.2  ml/m^2 --------- LA volume, ES, 1-p A4C                  52.9  ml     --------- LA volume/bsa, ES, 1-p A4C              26.Patterson  ml/m^2 --------- LA volume, ES, 1-p A2C                  34.2  ml     --------- LA volume/bsa, ES, 1-p A2C              17    ml/m^2 ---------  Mitral valve                            Value        Reference Mitral E-wave peak velocity             102    cm/s   --------- Mitral deceleration time         (H)    292   ms     150 - 230 Mitral peak gradient, D                 4     mm Hg  --------- Mitral E/A ratio, peak                  0.8          ---------  Systemic veins  Value        Reference Estimated CVP                           8     mm Hg  ---------  Right ventricle                         Value        Reference RV s&', lateral, S                       11.Patterson  cm/s   ---------  Legend: (L)  and  (H)  mark values outside specified reference range.  ------------------------------------------------------------------- Prepared and Electronically Authenticated by  Charlton Haws, M.D. 2016-09-30T16:06:42              Risk Assessment/Calculations:              Physical Exam:   VS:  BP 131/81   Pulse 92   Ht 5\' 7"  (1.702 m)   Wt 155 lb 6.4 oz (70.5 kg)   SpO2 95%   BMI 24.34 kg/m    Wt Readings from Last Patterson Encounters:  09/22/22 155 lb 6.4 oz (70.5 kg)  08/25/22 152 lb 6.4 oz (69.1 kg)  06/15/22 154 lb (69.9 kg)     GEN: Well nourished, well developed in no acute distress NECK: No JVD; No carotid bruits CARDIAC: RRR, no murmurs, rubs, gallops RESPIRATORY:  Clear to auscultation without rales, wheezing or rhonchi  ABDOMEN: Soft, non-tender, non-distended EXTREMITIES:  Trace edema, chronic venous stasis changes  ASSESSMENT AND PLAN:   #HTN: -Well controlled at home 120s and improved on re-check here -Continue valsartan 320mg  daily -Continue amlodipine 10mg  daily -Contiinue chlorthalidone 12.5mg  daily  #LE edema: -Suspect venous stasis and patient wishes to monitor at this time -Continue chlorthalidone 12.5mg  daily -Declined TTE at this time but may consider in the future  #HLD: -Did not tolerate statin therapy -Not on choelsterol lowering therapy per patient preference; managing with lifestyle modifications  #DMII; -On metformin and A1C 6.6 -Managed by PCP         Signed, Meriam Sprague, MD

## 2022-09-22 ENCOUNTER — Encounter: Payer: Self-pay | Admitting: Cardiology

## 2022-09-22 ENCOUNTER — Ambulatory Visit: Payer: Medicare PPO | Attending: Cardiology | Admitting: Cardiology

## 2022-09-22 VITALS — BP 131/81 | HR 92 | Ht 67.0 in | Wt 155.4 lb

## 2022-09-22 DIAGNOSIS — E785 Hyperlipidemia, unspecified: Secondary | ICD-10-CM

## 2022-09-22 DIAGNOSIS — R600 Localized edema: Secondary | ICD-10-CM | POA: Diagnosis not present

## 2022-09-22 DIAGNOSIS — I10 Essential (primary) hypertension: Secondary | ICD-10-CM

## 2022-09-22 DIAGNOSIS — E119 Type 2 diabetes mellitus without complications: Secondary | ICD-10-CM

## 2022-09-22 NOTE — Patient Instructions (Signed)
Medication Instructions:   Your physician recommends that you continue on your current medications as directed. Please refer to the Current Medication list given to you today.  *If you need a refill on your cardiac medications before your next appointment, please call your pharmacy*    Follow-Up: At [facility], you and your health needs are our priority.  As part of our continuing mission to provide you with exceptional heart care, we have created designated Provider Care Teams.  These Care Teams include your primary Cardiologist (physician) and Advanced Practice Providers (APPs -  Physician Assistants and Nurse Practitioners) who all work together to provide you with the care you need, when you need it.  We recommend signing up for the patient portal called "MyChart".  Sign up information is provided on this After Visit Summary.  MyChart is used to connect with patients for Virtual Visits (Telemedicine).  Patients are able to view lab/test results, encounter notes, upcoming appointments, etc.  Non-urgent messages can be sent to your provider as well.   To learn more about what you can do with MyChart, go to [email or web address].    Your next appointment:   1 year(s)  Provider:   Dr. Acharya   

## 2022-09-27 DIAGNOSIS — M9902 Segmental and somatic dysfunction of thoracic region: Secondary | ICD-10-CM | POA: Diagnosis not present

## 2022-09-27 DIAGNOSIS — S233XXA Sprain of ligaments of thoracic spine, initial encounter: Secondary | ICD-10-CM | POA: Diagnosis not present

## 2022-10-16 ENCOUNTER — Other Ambulatory Visit: Payer: Self-pay | Admitting: Family Medicine

## 2022-10-16 DIAGNOSIS — E039 Hypothyroidism, unspecified: Secondary | ICD-10-CM

## 2022-10-18 DIAGNOSIS — M9902 Segmental and somatic dysfunction of thoracic region: Secondary | ICD-10-CM | POA: Diagnosis not present

## 2022-10-18 DIAGNOSIS — S233XXA Sprain of ligaments of thoracic spine, initial encounter: Secondary | ICD-10-CM | POA: Diagnosis not present

## 2022-10-27 ENCOUNTER — Encounter: Payer: Self-pay | Admitting: Internal Medicine

## 2022-10-27 ENCOUNTER — Ambulatory Visit: Payer: Medicare PPO | Admitting: Internal Medicine

## 2022-10-27 VITALS — BP 130/62 | HR 108 | Temp 97.4°F | Ht 67.0 in | Wt 152.0 lb

## 2022-10-27 DIAGNOSIS — H6123 Impacted cerumen, bilateral: Secondary | ICD-10-CM | POA: Diagnosis not present

## 2022-10-27 NOTE — Progress Notes (Signed)
Subjective:    Patient ID: Jordan Patterson, female    DOB: 06/20/1940, 82 y.o.   MRN: [id number]  HPI Here due to ear fullness  Feels her ears are clogged with wax Does rinse with vinegar rinse weekly Does flush--but nothing coming out  Hearing is off due to this  Current Outpatient Medications on File Prior to Visit  Medication Sig Dispense Refill   Accu-Chek Softclix Lancets lancets Use as instructed 100 each 12   acetaminophen (TYLENOL) 500 MG tablet Take 1 tablet (500 mg total) by mouth every 6 (six) hours as needed (pain). 30 tablet 0   albuterol (VENTOLIN HFA) 108 (90 Base) MCG/ACT inhaler TAKE 2 PUFFS BY MOUTH EVERY 6 HOURS AS NEEDED FOR WHEEZE OR SHORTNESS OF BREATH (Patient taking differently: Inhale 2 puffs into the lungs every 6 (six) hours as needed for wheezing or shortness of breath. TAKE 2 PUFFS BY MOUTH EVERY 6 HOURS AS NEEDED FOR WHEEZE OR SHORTNESS OF BREATH) 18 each 2   amLODipine (NORVASC) 10 MG tablet Take 1 tablet (10 mg total) by mouth daily. (Patient taking differently: Take 10 mg by mouth daily.) 90 tablet 3   aspirin 81 MG tablet Take 81 mg by mouth daily.     benzonatate (TESSALON) 200 MG capsule Take 1 capsule (200 mg total) by mouth 3 (three) times daily as needed for cough. (Patient taking differently: Take 200 mg by mouth 3 (three) times daily as needed for cough.) 30 capsule 1   blood glucose meter kit and supplies Dispense based on patient and insurance preference. Use qd and prn for uncontrolled diabetes. (FOR ICD-10 E10.9, E11.9). 1 each 1   Budeson-Glycopyrrol-Formoterol (BREZTRI AEROSPHERE) 160-9-4.8 MCG/ACT AERO Inhale 2 puffs then rinse mouth, twice daily 32.1 g 4   Calcium 250 MG CAPS Take 500 mg by mouth at bedtime.     chlorpheniramine (CHLOR-TRIMETON) 4 MG tablet Take 4 mg by mouth in the morning, at noon, and at bedtime.     chlorthalidone (HYGROTON) 25 MG tablet Take 0.5 tablets (12.5 mg total) by mouth daily. (Patient taking differently: Take  12.5 mg by mouth daily.) 1 tablet 0   esomeprazole (NEXIUM) 40 MG capsule 1 capsule by mouth every day before breakfast 90 capsule 6   famotidine (PEPCID) 20 MG tablet TAKE 1 TABLET BY MOUTH EVERYDAY AT BEDTIME 90 tablet 3   ferrous sulfate 325 (65 FE) MG EC tablet Take 325 mg by mouth every Monday, Wednesday, and Friday. Takes in am     Garlic (GARLIQUE PO) Take 1 tablet by mouth daily.     glucose blood test strip Use as instructed 100 each 12   levothyroxine (SYNTHROID) 100 MCG tablet TAKE 1 TABLET BY MOUTH EVERY DAY BEFORE BREAKFAST 90 tablet 2   MAGNESIUM LACTATE PO Take 2 tablets by mouth at bedtime. 210 mg each     metFORMIN (GLUCOPHAGE) 1000 MG tablet Take 1 tablet (1,000 mg total) by mouth 2 (two) times daily with a meal. (Patient taking differently: Take 1,000 mg by mouth 2 (two) times daily with a meal.) 180 tablet 3   montelukast (SINGULAIR) 10 MG tablet TAKE 1 TABLET BY MOUTH EVERYDAY AT BEDTIME (Patient taking differently: Take 10 mg by mouth at bedtime.) 90 tablet 4   Multiple Vitamins-Minerals (EYE HEALTH) CAPS Take 1 capsule by mouth daily.     Nutritional Supplements (GLUCOSE MANAGEMENT PO) Take by mouth.     nystatin-triamcinolone (MYCOLOG II) cream Apply 1 Application topically as needed.  Omega-3 Fatty Acids (OMEGA 3 500 PO) Take 1 capsule by mouth in the morning and at bedtime.     Polyvinyl Alcohol-Povidone (REFRESH OP) Place 1 drop into both eyes daily.      valsartan (DIOVAN) 320 MG tablet TAKE 1 TABLET BY MOUTH EVERY DAY (Patient taking differently: Take 320 mg by mouth daily.) 90 tablet 3   Vitamin D-Vitamin K (VITAMIN K2-VITAMIN D3 PO) Take 1 capsule by mouth daily with lunch. D3 2000iu/  K2500     vitamin E 200 UNIT capsule Take 400 Units by mouth daily with lunch.     No current facility-administered medications on file prior to visit.    Allergies  Allergen Reactions   Ace Inhibitors Cough   Crestor [Rosuvastatin] Other (See Comments)    myalgias    Wellbutrin [Bupropion Hcl] Other (See Comments)    Unknown reaction   Cymbalta [Duloxetine Hcl] Palpitations and Other (See Comments)    headaches   Lipitor [Atorvastatin] Cough   Pregabalin Other (See Comments)    Unknown reaction   Tegretol [Carbamazepine] Other (See Comments)    Dizziness, headache    Past Medical History:  Diagnosis Date   Allergic rhinitis    Arthritis    hands and knees   Chronic constipation    Environmental and seasonal allergies    GERD (gastroesophageal reflux disease)    [name] EGD - mild esophageal dysmotility and small hiatal hernia   Hiatal hernia    History of esophageal stricture 2013   s/p dilatation   History of vertebral compression fracture 07/18/2014   S/p kyphoplasty T12, T7 and T8   Hyperlipidemia    mild, diet controlled   Hypertension    Hypothyroidism    Macular degeneration of both eyes    Migraines    Moderate persistent asthma without complication    pulmonologist--- dr c. young   Osteoporosis 07/24/2014   w/  hx compression fx  T12,  T7 and T8 s/p kyphoplasty   Prolapse of vaginal walls    anterior and posterior   Type 2 diabetes mellitus (HCC)    followed by pcp   (04-15-2022  pt stated she check blood sugar daily in am fasting,  average 106--117)   Uterovaginal prolapse, incomplete    Wears glasses     Past Surgical History:  Procedure Laterality Date   4 HOUR PH STUDY N/A 04/11/2017   Procedure: 24 HOUR PH STUDY;  Surgeon: Napoleon Form, MD;  Location: [facility];  Service: Endoscopy;  Laterality: N/A;   CATARACT EXTRACTION W/ INTRAOCULAR LENS IMPLANT Bilateral 2003   COLONOSCOPY  2017   COLPOCLEISIS N/A 04/19/2022   Procedure: Ignatius Specking COLPOCLEISIS;  Surgeon: Marguerita Beards, MD;  Location: [facility];  Service: Gynecology;  Laterality: N/A;   CYSTOSCOPY N/A 04/19/2022   Procedure: CYSTOSCOPY;  Surgeon: Marguerita Beards, MD;  Location: [facility];  Service:  Gynecology;  Laterality: N/A;   DILATION AND CURETTAGE OF UTERUS  1978   ESOPHAGEAL MANOMETRY N/A 04/11/2017   Procedure: ESOPHAGEAL MANOMETRY (EM);  Surgeon: Napoleon Form, MD;  Location: [facility];  Service: Endoscopy;  Laterality: N/A;   ESOPHAGOGASTRODUODENOSCOPY  06/25/2011   Procedure: ESOPHAGOGASTRODUODENOSCOPY (EGD);  Surgeon: Hart Carwin, MD;  Location: [facility];  Service: Endoscopy;  Laterality: N/A;  no Xray   KYPHOPLASTY N/A 08/30/2014   Procedure: THORACIC TWELVE KYPHOPLASTY;  Surgeon: [name], MD   KYPHOPLASTY N/A 01/22/2021   Procedure: Thoracic eight, Thoracic nine  KYPHOPLASTY;  Surgeon: Coletta Memos, MD;  Location: [facility];  Service: Neurosurgery;  Laterality: N/A;   LAPAROSCOPY ABDOMEN DIAGNOSTIC     yrs ago;    To R/O endometriosis   PH IMPEDANCE STUDY N/A 04/11/2017   Procedure: PH IMPEDANCE STUDY;  Surgeon: Napoleon Form, MD;  Location: [facility];  Service: Endoscopy;  Laterality: N/A;   RECTOCELE REPAIR N/A 04/19/2022   Procedure: Levator plication with perineorrhaphy;  Surgeon: Marguerita Beards, MD;  Location: [facility];  Service: Gynecology;  Laterality: N/A;   SAVORY DILATION  06/25/2011   Procedure: SAVORY DILATION;  Surgeon: Hart Carwin, MD;  Location: [facility];  Service: Endoscopy;  Laterality: N/A;   TONSILLECTOMY AND ADENOIDECTOMY  1962    Family History  Problem Relation Age of Onset   Mitral valve prolapse Father    Diabetes Father    Mitral valve prolapse Mother    Hypertension Mother    Stroke Mother 26       after valve surgery   Parkinson's disease Brother    Other Brother        POST WAR TRAUMA   Stroke Paternal Grandfather    Heart failure Maternal Grandfather    Cancer Neg Hx    Colon cancer Neg Hx    Stomach cancer Neg Hx    Esophageal cancer Neg Hx    Pancreatic cancer Neg Hx    Liver disease Neg Hx     Social History   Socioeconomic History   Marital status: Widowed     Spouse name: Not on file   Number of children: 3   Years of education: Not on file   Highest education level: Not on file  Occupational History   Occupation: Retired  Tobacco Use   Smoking status: Never    Passive exposure: Never   Smokeless tobacco: Never  Vaping Use   Vaping status: Never Used  Substance and Sexual Activity   Alcohol use: No    Alcohol/week: 0.0 standard drinks of alcohol   Drug use: Never   Sexual activity: Not Currently  Other Topics Concern   Not on file  Social History Narrative   Caffeine: occasional coffee   Lives alone, widower, 1 cat   Occupation: retired, 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   Act: works 3d/wk Chemical engineer), lives in [place], gardens, walks   Diet: good water, fruits/vegetables daily      HCPOA is Primavera [place] [phone] (cell)   Social Determinants of Health   Financial Resource Strain: Low Risk  (03/15/2022)   Overall Financial Resource Strain (CARDIA)    Difficulty of Paying Living Expenses: Not hard at all  Food Insecurity: No Food Insecurity (03/15/2022)   Hunger Vital Sign    Worried About Running Out of Food in the Last Year: Never true    Ran Out of Food in the Last Year: Never true  Transportation Needs: No Transportation Needs (03/15/2022)   PRAPARE - Administrator, Civil Service (Medical): No    Lack of Transportation (Non-Medical): No  Physical Activity: Insufficiently Active (03/15/2022)   Exercise Vital Sign    Days of Exercise per Week: 5 days    Minutes of Exercise per Session: 20 min  Stress: No Stress Concern Present (03/15/2022)   [employer] of Occupational Health - Occupational Stress Questionnaire    Feeling of Stress : Not at all  Social Connections: Socially Isolated (03/15/2022)   Social Connection  and Isolation Panel [NHANES]    Frequency of Communication with Friends and Family: More than three times a week    Frequency of Social Gatherings with Friends and Family: Once a  week    Attends Religious Services: Never    Database administrator or Organizations: No    Attends Banker Meetings: Never    Marital Status: Widowed  Intimate Partner Violence: Not At Risk (03/15/2022)   Humiliation, Afraid, Rape, and Kick questionnaire    Fear of Current or Ex-Partner: No    Emotionally Abused: No    Physically Abused: No    Sexually Abused: No   Review of Systems No fever No URI symptoms     Objective:   Physical Exam Constitutional:      Appearance: Normal appearance.  HENT:     Ears:     Comments: Narrow canal on left--some cerumen Right is obstructed with cerumen Neurological:     Mental Status: She is alert.            Assessment & Plan:

## 2022-10-27 NOTE — Assessment & Plan Note (Addendum)
Will flush out After flushing--ears cleared and her symptoms resolved  Also recommended 1/2 strength peroxide for loosening wax before trying flushing at home

## 2022-10-28 ENCOUNTER — Encounter: Payer: Self-pay | Admitting: Family Medicine

## 2022-11-01 DIAGNOSIS — M9902 Segmental and somatic dysfunction of thoracic region: Secondary | ICD-10-CM | POA: Diagnosis not present

## 2022-11-01 DIAGNOSIS — S233XXA Sprain of ligaments of thoracic spine, initial encounter: Secondary | ICD-10-CM | POA: Diagnosis not present

## 2022-11-03 ENCOUNTER — Encounter: Payer: Self-pay | Admitting: Family Medicine

## 2022-11-04 ENCOUNTER — Encounter: Payer: Self-pay | Admitting: Primary Care

## 2022-11-04 ENCOUNTER — Ambulatory Visit: Payer: Medicare PPO | Admitting: Primary Care

## 2022-11-04 VITALS — BP 142/78 | HR 88 | Temp 97.3°F | Ht 67.0 in | Wt 150.0 lb

## 2022-11-04 DIAGNOSIS — R197 Diarrhea, unspecified: Secondary | ICD-10-CM | POA: Diagnosis not present

## 2022-11-04 LAB — CBC WITH DIFFERENTIAL/PLATELET
Basophils Absolute: 0.1 10*3/uL (ref 0.0–0.1)
Basophils Relative: 1.2 % (ref 0.0–3.0)
Eosinophils Absolute: 0.1 10*3/uL (ref 0.0–0.7)
Eosinophils Relative: 0.8 % (ref 0.0–5.0)
HCT: 40.2 % (ref 36.0–46.0)
Hemoglobin: 13 g/dL (ref 12.0–15.0)
Lymphocytes Relative: 30.1 % (ref 12.0–46.0)
Lymphs Abs: 2.3 10*3/uL (ref 0.7–4.0)
MCHC: 32.3 g/dL (ref 30.0–36.0)
MCV: 92.6 fl (ref 78.0–100.0)
Monocytes Absolute: 0.5 10*3/uL (ref 0.1–1.0)
Monocytes Relative: 6.3 % (ref 3.0–12.0)
Neutro Abs: 4.7 10*3/uL (ref 1.4–7.7)
Neutrophils Relative %: 61.6 % (ref 43.0–77.0)
Platelets: 197 10*3/uL (ref 150.0–400.0)
RBC: 4.34 Mil/uL (ref 3.87–5.11)
RDW: 13.5 % (ref 11.5–15.5)
WBC: 7.6 10*3/uL (ref 4.0–10.5)

## 2022-11-04 LAB — BASIC METABOLIC PANEL
BUN: 20 mg/dL (ref 6–23)
CO2: 30 meq/L (ref 19–32)
Calcium: 9.6 mg/dL (ref 8.4–10.5)
Chloride: 93 meq/L — ABNORMAL LOW (ref 96–112)
Creatinine, Ser: 0.78 mg/dL (ref 0.40–1.20)
GFR: 70.92 mL/min (ref >60.00)
Glucose, Bld: 129 mg/dL — ABNORMAL HIGH (ref 70–99)
Potassium: 4 meq/L (ref 3.5–5.1)
Sodium: 133 meq/L — ABNORMAL LOW (ref 135–145)

## 2022-11-04 NOTE — Addendum Note (Signed)
Addended by: Alvina Chou on: 11/04/2022 11:48 AM   Modules accepted: Orders

## 2022-11-04 NOTE — Patient Instructions (Signed)
Stop taking the Prevagen supplement.  Stop by the lab prior to leaving today. I will notify you of your results once received.   Return the stool samples of soon as possible.  It was a pleasure to see you today!

## 2022-11-04 NOTE — Progress Notes (Signed)
Subjective:    Patient ID: Jordan Patterson, female    DOB: 09/03/1940, 82 y.o.   MRN: [id number]  Diarrhea  Pertinent negatives include no abdominal pain, chills, coughing, fever or vomiting.    Jordan Patterson is a very pleasant 82 y.o. female patient of Dr. Milinda Antis with a history of hiatal hernia, hypertension, GERD, type 2 diabetes, hypothyroidism who presents today to discuss diarrhea.  Symptom onset three weeks ago with one episode of diarrhea. The following week she began to notice recurrent diarrhea once daily. Two days ago she had 4 episodes of diarrhea. She did not have diarrhea yesterday, but she's had 1 episode of diarrhea today. Her stool is mostly liquid.   She occasionally has difficulty making it to the toilet in time and will experience fecal incontinence. Her appetite is poor overall which is chronic. She is hydrating with water, protein shakes, hot tea.   She began taking Prevagen supplement approximately 2 weeks ago. No other new medication, antibiotic use, or supplements. She is managed on magnesium daily for which she has been taking for years.   Yesterday she "didn't feel well", but today she feels fine. She denies fevers, chills, body aches, cough, recent travel, nausea, vomiting, abdominal pain, changes in her diet.   She underwent rectocele repair in February 2024, no complications. She's not taken anything OTC for her symptoms.   Review of Systems  Constitutional:  Negative for chills, fatigue and fever.  Respiratory:  Negative for cough.   Gastrointestinal:  Positive for diarrhea. Negative for abdominal pain, nausea and vomiting.         Past Medical History:  Diagnosis Date   Allergic rhinitis    Arthritis    hands and knees   Chronic constipation    Environmental and seasonal allergies    GERD (gastroesophageal reflux disease)    [name] EGD - mild esophageal dysmotility and small hiatal hernia   Hiatal hernia    History of esophageal stricture 2013    s/p dilatation   History of vertebral compression fracture 07/18/2014   S/p kyphoplasty T12, T7 and T8   Hyperlipidemia    mild, diet controlled   Hypertension    Hypothyroidism    Macular degeneration of both eyes    Migraines    Moderate persistent asthma without complication    pulmonologist--- dr c. young   Osteoporosis 07/24/2014   w/  hx compression fx  T12,  T7 and T8 s/p kyphoplasty   Prolapse of vaginal walls    anterior and posterior   Type 2 diabetes mellitus (HCC)    followed by pcp   (04-15-2022  pt stated she check blood sugar daily in am fasting,  average 106--117)   Uterovaginal prolapse, incomplete    Wears glasses     Social History   Socioeconomic History   Marital status: Widowed    Spouse name: Not on file   Number of children: 3   Years of education: Not on file   Highest education level: Not on file  Occupational History   Occupation: Retired  Tobacco Use   Smoking status: Never    Passive exposure: Never   Smokeless tobacco: Never  Vaping Use   Vaping status: Never Used  Substance and Sexual Activity   Alcohol use: No    Alcohol/week: 0.0 standard drinks of alcohol   Drug use: Never   Sexual activity: Not Currently  Other Topics Concern   Not on file  Social History Narrative  Caffeine: occasional coffee   Lives alone, widower, 1 cat   Occupation: retired, 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   Act: works 3d/wk Chemical engineer), lives in [place], gardens, walks   Diet: good water, fruits/vegetables daily      HCPOA is Jordan Patterson [phone] (cell)   Social Determinants of Health   Financial Resource Strain: Low Risk  (03/15/2022)   Overall Financial Resource Strain (CARDIA)    Difficulty of Paying Living Expenses: Not hard at all  Food Insecurity: No Food Insecurity (03/15/2022)   Hunger Vital Sign    Worried About Running Out of Food in the Last Year: Never true    Ran Out of Food in the Last Year: Never true   Transportation Needs: No Transportation Needs (03/15/2022)   PRAPARE - Administrator, Civil Service (Medical): No    Lack of Transportation (Non-Medical): No  Physical Activity: Insufficiently Active (03/15/2022)   Exercise Vital Sign    Days of Exercise per Week: 5 days    Minutes of Exercise per Session: 20 min  Stress: No Stress Concern Present (03/15/2022)   [employer] of Occupational Health - Occupational Stress Questionnaire    Feeling of Stress : Not at all  Social Connections: Socially Isolated (03/15/2022)   Social Connection and Isolation Panel [NHANES]    Frequency of Communication with Friends and Family: More than three times a week    Frequency of Social Gatherings with Friends and Family: Once a week    Attends Religious Services: Never    Database administrator or Organizations: No    Attends Banker Meetings: Never    Marital Status: Widowed  Intimate Partner Violence: Not At Risk (03/15/2022)   Humiliation, Afraid, Rape, and Kick questionnaire    Fear of Current or Ex-Partner: No    Emotionally Abused: No    Physically Abused: No    Sexually Abused: No    Past Surgical History:  Procedure Laterality Date   47 HOUR PH STUDY N/A 04/11/2017   Procedure: 24 HOUR PH STUDY;  Surgeon: Napoleon Form, MD;  Location: [facility];  Service: Endoscopy;  Laterality: N/A;   CATARACT EXTRACTION W/ INTRAOCULAR LENS IMPLANT Bilateral 2003   COLONOSCOPY  2017   COLPOCLEISIS N/A 04/19/2022   Procedure: Ignatius Specking COLPOCLEISIS;  Surgeon: Marguerita Beards, MD;  Location: [facility];  Service: Gynecology;  Laterality: N/A;   CYSTOSCOPY N/A 04/19/2022   Procedure: CYSTOSCOPY;  Surgeon: Marguerita Beards, MD;  Location: [facility];  Service: Gynecology;  Laterality: N/A;   DILATION AND CURETTAGE OF UTERUS  1978   ESOPHAGEAL MANOMETRY N/A 04/11/2017   Procedure: ESOPHAGEAL MANOMETRY (EM);  Surgeon: Napoleon Form, MD;  Location: [facility];  Service: Endoscopy;  Laterality: N/A;   ESOPHAGOGASTRODUODENOSCOPY  06/25/2011   Procedure: ESOPHAGOGASTRODUODENOSCOPY (EGD);  Surgeon: Hart Carwin, MD;  Location: [facility];  Service: Endoscopy;  Laterality: N/A;  no Xray   KYPHOPLASTY N/A 08/30/2014   Procedure: THORACIC TWELVE KYPHOPLASTY;  Surgeon: [name], MD   KYPHOPLASTY N/A 01/22/2021   Procedure: Thoracic eight, Thoracic nine KYPHOPLASTY;  Surgeon: Coletta Memos, MD;  Location: [facility];  Service: Neurosurgery;  Laterality: N/A;   LAPAROSCOPY ABDOMEN DIAGNOSTIC     yrs ago;    To R/O endometriosis   PH IMPEDANCE STUDY N/A 04/11/2017   Procedure: PH IMPEDANCE STUDY;  Surgeon: Napoleon Form, MD;  Location: [facility];  Service:  Endoscopy;  Laterality: N/A;   RECTOCELE REPAIR N/A 04/19/2022   Procedure: Levator plication with perineorrhaphy;  Surgeon: Marguerita Beards, MD;  Location[facility];  Service: Gynecology;  Laterality: N/A;   SAVORY DILATION  06/25/2011   Procedure: SAVORY DILATION;  Surgeon: Hart Carwin, MD;  Location: [facility];  Service: Endoscopy;  Laterality: N/A;   TONSILLECTOMY AND ADENOIDECTOMY  1962    Family History  Problem Relation Age of Onset   Mitral valve prolapse Father    Diabetes Father    Mitral valve prolapse Mother    Hypertension Mother    Stroke Mother 61       after valve surgery   Parkinson's disease Brother    Other Brother        POST WAR TRAUMA   Stroke Paternal Grandfather    Heart failure Maternal Grandfather    Cancer Neg Hx    Colon cancer Neg Hx    Stomach cancer Neg Hx    Esophageal cancer Neg Hx    Pancreatic cancer Neg Hx    Liver disease Neg Hx     Allergies  Allergen Reactions   Ace Inhibitors Cough   Crestor [Rosuvastatin] Other (See Comments)    myalgias   Wellbutrin [Bupropion Hcl] Other (See Comments)    Unknown reaction   Cymbalta [Duloxetine Hcl] Palpitations and Other (See  Comments)    headaches   Lipitor [Atorvastatin] Cough   Pregabalin Other (See Comments)    Unknown reaction   Tegretol [Carbamazepine] Other (See Comments)    Dizziness, headache    Current Outpatient Medications on File Prior to Visit  Medication Sig Dispense Refill   Accu-Chek Softclix Lancets lancets Use as instructed 100 each 12   acetaminophen (TYLENOL) 500 MG tablet Take 1 tablet (500 mg total) by mouth every 6 (six) hours as needed (pain). 30 tablet 0   albuterol (VENTOLIN HFA) 108 (90 Base) MCG/ACT inhaler TAKE 2 PUFFS BY MOUTH EVERY 6 HOURS AS NEEDED FOR WHEEZE OR SHORTNESS OF BREATH (Patient taking differently: Inhale 2 puffs into the lungs every 6 (six) hours as needed for wheezing or shortness of breath. TAKE 2 PUFFS BY MOUTH EVERY 6 HOURS AS NEEDED FOR WHEEZE OR SHORTNESS OF BREATH) 18 each 2   amLODipine (NORVASC) 10 MG tablet Take 1 tablet (10 mg total) by mouth daily. (Patient taking differently: Take 10 mg by mouth daily.) 90 tablet 3   Apoaequorin (PREVAGEN PO) Take by mouth.     blood glucose meter kit and supplies Dispense based on patient and insurance preference. Use qd and prn for uncontrolled diabetes. (FOR ICD-10 E10.9, E11.9). 1 each 1   Budeson-Glycopyrrol-Formoterol (BREZTRI AEROSPHERE) 160-9-4.8 MCG/ACT AERO Inhale 2 puffs then rinse mouth, twice daily 32.1 g 4   Calcium 250 MG CAPS Take 500 mg by mouth at bedtime.     chlorpheniramine (CHLOR-TRIMETON) 4 MG tablet Take 4 mg by mouth in the morning, at noon, and at bedtime.     chlorthalidone (HYGROTON) 25 MG tablet Take 0.5 tablets (12.5 mg total) by mouth daily. (Patient taking differently: Take 12.5 mg by mouth daily.) 1 tablet 0   esomeprazole (NEXIUM) 40 MG capsule 1 capsule by mouth every day before breakfast 90 capsule 6   famotidine (PEPCID) 20 MG tablet TAKE 1 TABLET BY MOUTH EVERYDAY AT BEDTIME 90 tablet 3   ferrous sulfate 325 (65 FE) MG EC tablet Take 325 mg by mouth every Monday, Wednesday, and Friday.  Takes in  am     Garlic (GARLIQUE PO) Take 1 tablet by mouth daily.     glucose blood test strip Use as instructed 100 each 12   levothyroxine (SYNTHROID) 100 MCG tablet TAKE 1 TABLET BY MOUTH EVERY DAY BEFORE BREAKFAST 90 tablet 2   MAGNESIUM LACTATE PO Take 2 tablets by mouth at bedtime. 210 mg each     metFORMIN (GLUCOPHAGE) 1000 MG tablet Take 1 tablet (1,000 mg total) by mouth 2 (two) times daily with a meal. (Patient taking differently: Take 1,000 mg by mouth 2 (two) times daily with a meal.) 180 tablet 3   montelukast (SINGULAIR) 10 MG tablet TAKE 1 TABLET BY MOUTH EVERYDAY AT BEDTIME (Patient taking differently: Take 10 mg by mouth at bedtime.) 90 tablet 4   Multiple Vitamins-Minerals (EYE HEALTH) CAPS Take 1 capsule by mouth daily.     Nutritional Supplements (GLUCOSE MANAGEMENT PO) Take by mouth.     nystatin-triamcinolone (MYCOLOG II) cream Apply 1 Application topically as needed.     Omega-3 Fatty Acids (OMEGA 3 500 PO) Take 1 capsule by mouth in the morning and at bedtime.     Polyvinyl Alcohol-Povidone (REFRESH OP) Place 1 drop into both eyes daily.      valsartan (DIOVAN) 320 MG tablet TAKE 1 TABLET BY MOUTH EVERY DAY (Patient taking differently: Take 320 mg by mouth daily.) 90 tablet 3   Vitamin D-Vitamin K (VITAMIN K2-VITAMIN D3 PO) Take 1 capsule by mouth daily with lunch. D3 2000iu/  K2500     vitamin E 200 UNIT capsule Take 400 Units by mouth daily with lunch.     aspirin 81 MG tablet Take 81 mg by mouth daily. (Patient not taking: Reported on 11/04/2022)     benzonatate (TESSALON) 200 MG capsule Take 1 capsule (200 mg total) by mouth 3 (three) times daily as needed for cough. (Patient not taking: Reported on 11/04/2022) 30 capsule 1   No current facility-administered medications on file prior to visit.    BP (!) 142/78   Pulse 88   Temp (!) 97.3 F (36.3 C) (Temporal)   Ht 5\' 7"  (1.702 m)   Wt 150 lb (68 kg)   SpO2 98%   BMI 23.49 kg/m  Objective:   Physical  Exam Constitutional:      General: She is not in acute distress.    Appearance: She is not ill-appearing.  Cardiovascular:     Rate and Rhythm: Normal rate and regular rhythm.  Pulmonary:     Effort: Pulmonary effort is normal.  Abdominal:     General: Bowel sounds are normal.     Palpations: Abdomen is soft.     Tenderness: There is no abdominal tenderness.  Musculoskeletal:     Cervical back: Neck supple.  Skin:    General: Skin is warm and dry.           Assessment & Plan:  Acute diarrhea Assessment & Plan: Unclear etiology but higher suspicion for Prevagen supplement side effects. Stop Prevagen.  Reviewed TSH from July 2024 which was within normal range.  Checking labs today including BMP, CBC with differential. We will send her home with stool studies to complete and return.  Encouraged hydration with water and electrolytes. Await results.  Orders: -     CBC with Differential/Platelet -     Basic metabolic panel -     Giardia antigen -     Gastrointestinal Pathogen Pnl RT, PCR -     C. difficile GDH  and Toxin A/B        Doreene Nest, NP

## 2022-11-04 NOTE — Assessment & Plan Note (Signed)
Unclear etiology but higher suspicion for Prevagen supplement side effects. Stop Prevagen.  Reviewed TSH from July 2024 which was within normal range.  Checking labs today including BMP, CBC with differential. We will send her home with stool studies to complete and return.  Encouraged hydration with water and electrolytes. Await results.

## 2022-11-09 ENCOUNTER — Other Ambulatory Visit: Payer: Self-pay | Admitting: Family Medicine

## 2022-11-09 ENCOUNTER — Other Ambulatory Visit: Payer: Self-pay

## 2022-11-09 DIAGNOSIS — R197 Diarrhea, unspecified: Secondary | ICD-10-CM | POA: Diagnosis not present

## 2022-11-11 LAB — GASTROINTESTINAL PATHOGEN PNL
CampyloBacter Group: NOT DETECTED
Norovirus GI/GII: NOT DETECTED
Rotavirus A: NOT DETECTED
Salmonella species: NOT DETECTED
Shiga Toxin 1: NOT DETECTED
Shiga Toxin 2: NOT DETECTED
Shigella Species: NOT DETECTED
Vibrio Group: NOT DETECTED
Yersinia enterocolitica: NOT DETECTED

## 2022-11-11 LAB — HOUSE ACCOUNT TRACKING

## 2022-11-22 DIAGNOSIS — M9902 Segmental and somatic dysfunction of thoracic region: Secondary | ICD-10-CM | POA: Diagnosis not present

## 2022-11-22 DIAGNOSIS — S233XXA Sprain of ligaments of thoracic spine, initial encounter: Secondary | ICD-10-CM | POA: Diagnosis not present

## 2022-11-29 ENCOUNTER — Other Ambulatory Visit: Payer: Self-pay

## 2022-11-29 MED ORDER — VALSARTAN 320 MG PO TABS: 320.0000 mg | ORAL_TABLET | Freq: Every day | ORAL | 2 refills | Status: DC

## 2022-12-06 DIAGNOSIS — M9902 Segmental and somatic dysfunction of thoracic region: Secondary | ICD-10-CM | POA: Diagnosis not present

## 2022-12-06 DIAGNOSIS — S233XXA Sprain of ligaments of thoracic spine, initial encounter: Secondary | ICD-10-CM | POA: Diagnosis not present

## 2022-12-10 ENCOUNTER — Ambulatory Visit (INDEPENDENT_AMBULATORY_CARE_PROVIDER_SITE_OTHER): Payer: Medicare PPO | Admitting: *Deleted

## 2022-12-10 DIAGNOSIS — Z23 Encounter for immunization: Secondary | ICD-10-CM | POA: Diagnosis not present

## 2022-12-10 NOTE — Progress Notes (Signed)
Pt presented today for their flu shot.  Vaccine was administered in the pt's L Deltiod. Pt tolerated well with no complaints.

## 2022-12-27 DIAGNOSIS — H40013 Open angle with borderline findings, low risk, bilateral: Secondary | ICD-10-CM | POA: Diagnosis not present

## 2022-12-27 DIAGNOSIS — M9902 Segmental and somatic dysfunction of thoracic region: Secondary | ICD-10-CM | POA: Diagnosis not present

## 2022-12-27 DIAGNOSIS — S233XXA Sprain of ligaments of thoracic spine, initial encounter: Secondary | ICD-10-CM | POA: Diagnosis not present

## 2022-12-29 ENCOUNTER — Other Ambulatory Visit: Payer: Self-pay | Admitting: Nurse Practitioner

## 2022-12-29 DIAGNOSIS — J4540 Moderate persistent asthma, uncomplicated: Secondary | ICD-10-CM

## 2023-01-12 ENCOUNTER — Other Ambulatory Visit: Payer: Self-pay

## 2023-01-12 MED ORDER — CHLORTHALIDONE 25 MG PO TABS: 12.5000 mg | ORAL_TABLET | Freq: Every day | ORAL | 2 refills | Status: DC

## 2023-01-17 DIAGNOSIS — M9902 Segmental and somatic dysfunction of thoracic region: Secondary | ICD-10-CM | POA: Diagnosis not present

## 2023-01-17 DIAGNOSIS — S233XXA Sprain of ligaments of thoracic spine, initial encounter: Secondary | ICD-10-CM | POA: Diagnosis not present

## 2023-01-18 ENCOUNTER — Encounter: Payer: Self-pay | Admitting: Internal Medicine

## 2023-01-18 DIAGNOSIS — J4540 Moderate persistent asthma, uncomplicated: Secondary | ICD-10-CM

## 2023-01-19 NOTE — Telephone Encounter (Signed)
Please advise if ok with her request  Benzonatate prescription Received: Jordan Patterson, [facility] Pool Phone Number: [phone]   I currently have a prescription for Benzonatate for a 30 day supply. I wanted to ask if it would be possible to change the 30 days to 90 days. It is written to be taken three times daily but I take it only two times each day. It has helped me a great deal & it will be helpful to have a prescription for 90 days if possible. Thank you, Jordan Patterson. Ladona Ridgel

## 2023-01-19 NOTE — Telephone Encounter (Signed)
Ok to change benzonatate 200 mg, to # 180, 1 three times daily as needed for cough, refill x 3     To either local or mail-order phartmacy

## 2023-01-21 ENCOUNTER — Other Ambulatory Visit: Payer: Self-pay | Admitting: Family Medicine

## 2023-01-25 MED ORDER — BENZONATATE 200 MG PO CAPS: 200.0000 mg | ORAL_CAPSULE | Freq: Three times a day (TID) | ORAL | 3 refills | Status: DC | PRN

## 2023-01-31 DIAGNOSIS — M9902 Segmental and somatic dysfunction of thoracic region: Secondary | ICD-10-CM | POA: Diagnosis not present

## 2023-01-31 DIAGNOSIS — S233XXA Sprain of ligaments of thoracic spine, initial encounter: Secondary | ICD-10-CM | POA: Diagnosis not present

## 2023-02-14 DIAGNOSIS — M9902 Segmental and somatic dysfunction of thoracic region: Secondary | ICD-10-CM | POA: Diagnosis not present

## 2023-02-14 DIAGNOSIS — S233XXA Sprain of ligaments of thoracic spine, initial encounter: Secondary | ICD-10-CM | POA: Diagnosis not present

## 2023-02-18 ENCOUNTER — Encounter: Payer: Self-pay | Admitting: Family Medicine

## 2023-02-22 ENCOUNTER — Telehealth: Payer: Self-pay | Admitting: *Deleted

## 2023-02-22 NOTE — Telephone Encounter (Signed)
See prev note. No appt scheduled with PCP right now

## 2023-02-22 NOTE — Telephone Encounter (Signed)
 I'm glad you have not lost more weight  Let's do an annual follow up of chronic medical problems in February some time

## 2023-02-22 NOTE — Telephone Encounter (Signed)
 Copied from CRM [id number]. Topic: Appointments - Appointment Info/Confirmation >> Feb 22, 2023 12:30 PM Jordan Patterson wrote: Patient called to follow up on current care plan. Patient wanted to know when her next appointment with Dr. Randeen would be. Patient advised she hasn't lost any weight since her last September weigh-in. Please assist.

## 2023-03-07 DIAGNOSIS — S233XXA Sprain of ligaments of thoracic spine, initial encounter: Secondary | ICD-10-CM | POA: Diagnosis not present

## 2023-03-07 DIAGNOSIS — M9902 Segmental and somatic dysfunction of thoracic region: Secondary | ICD-10-CM | POA: Diagnosis not present

## 2023-03-16 ENCOUNTER — Ambulatory Visit: Payer: Medicare PPO

## 2023-03-16 VITALS — Ht 67.0 in | Wt 150.0 lb

## 2023-03-16 DIAGNOSIS — Z0000 Encounter for general adult medical examination without abnormal findings: Secondary | ICD-10-CM

## 2023-03-16 NOTE — Progress Notes (Signed)
Subjective:   Jordan Patterson is a 83 y.o. female who presents for Medicare Annual (Subsequent) preventive examination.  Visit Complete: Virtual I connected with  Jordan Patterson on 03/16/23 by a audio enabled telemedicine application and verified that I am speaking with the correct person using two identifiers.  Patient Location: Home  Provider Location: Home Office  I discussed the limitations of evaluation and management by telemedicine. The patient expressed understanding and agreed to proceed.  Vital Signs: Because this visit was a virtual/telehealth visit, some criteria may be missing or patient reported. Any vitals not documented were not able to be obtained and vitals that have been documented are patient reported.  Patient Medicare AWV questionnaire was completed by the patient on 03/15/23; I have confirmed that all information answered by patient is correct and no changes since this date.  Cardiac Risk Factors include: advanced age (>30men, >39 women);diabetes mellitus;hypertension;dyslipidemia;sedentary lifestyle     Objective:    Today's Vitals   03/16/23 1432  Weight: 150 lb (68 kg)  Height: 5\' 7"  (1.702 m)  PainSc: 0-No pain   Body mass index is 23.49 kg/m.     03/16/2023    2:46 PM 04/19/2022    8:57 AM 03/15/2022   11:21 AM 03/13/2021   11:20 AM 01/22/2021    2:16 PM 01/20/2021    1:14 PM 03/12/2020   11:07 AM  Advanced Directives  Does Patient Have a Medical Advance Directive? Yes Yes Yes Yes Yes Yes Yes  Type of Estate agent of [place];Living will  Healthcare Power of [place];Living will Healthcare Power of [place];Living will Healthcare Power of [employer] Power of [employer] Power of [place];Living will  Does patient want to make changes to medical advance directive?  No - Patient declined No - Patient declined Yes (MAU/Ambulatory/Procedural Areas - Information given) No - Patient declined    Copy of Healthcare Power  of Attorney in Chart? No - copy requested  Yes - validated most recent copy scanned in chart (See row information)  No - copy requested  No - copy requested    Current Medications (verified) Outpatient Encounter Medications as of 03/16/2023  Medication Sig   Accu-Chek Softclix Lancets lancets Use as instructed   acetaminophen (TYLENOL) 500 MG tablet Take 1 tablet (500 mg total) by mouth every 6 (six) hours as needed (pain).   albuterol (VENTOLIN HFA) 108 (90 Base) MCG/ACT inhaler TAKE 2 PUFFS BY MOUTH EVERY 6 HOURS AS NEEDED FOR WHEEZE OR SHORTNESS OF BREATH (Patient taking differently: Inhale 2 puffs into the lungs every 6 (six) hours as needed for wheezing or shortness of breath. TAKE 2 PUFFS BY MOUTH EVERY 6 HOURS AS NEEDED FOR WHEEZE OR SHORTNESS OF BREATH)   amLODipine (NORVASC) 10 MG tablet Take 1 tablet (10 mg total) by mouth daily. (Patient taking differently: Take 10 mg by mouth daily.)   Apoaequorin (PREVAGEN PO) Take by mouth.   aspirin 81 MG tablet Take 81 mg by mouth daily. (Patient not taking: Reported on 11/04/2022)   benzonatate (TESSALON) 200 MG capsule Take 1 capsule (200 mg total) by mouth 3 (three) times daily as needed for cough.   blood glucose meter kit and supplies Dispense based on patient and insurance preference. Use qd and prn for uncontrolled diabetes. (FOR ICD-10 E10.9, E11.9).   Budeson-Glycopyrrol-Formoterol (BREZTRI AEROSPHERE) 160-9-4.8 MCG/ACT AERO Inhale 2 puffs then rinse mouth, twice daily   Calcium 250 MG CAPS Take 500 mg by mouth at bedtime.  chlorpheniramine (CHLOR-TRIMETON) 4 MG tablet Take 4 mg by mouth in the morning, at noon, and at bedtime.   chlorthalidone (HYGROTON) 25 MG tablet Take 0.5 tablets (12.5 mg total) by mouth daily.   esomeprazole (NEXIUM) 40 MG capsule 1 capsule by mouth every day before breakfast   famotidine (PEPCID) 20 MG tablet TAKE 1 TABLET BY MOUTH EVERYDAY AT BEDTIME   ferrous sulfate 325 (65 FE) MG EC tablet Take 325 mg by mouth  every Monday, Wednesday, and Friday. Takes in am   Garlic (GARLIQUE PO) Take 1 tablet by mouth daily.   glucose blood test strip Use as instructed   levothyroxine (SYNTHROID) 100 MCG tablet TAKE 1 TABLET BY MOUTH EVERY DAY BEFORE BREAKFAST   MAGNESIUM LACTATE PO Take 2 tablets by mouth at bedtime. 210 mg each   metFORMIN (GLUCOPHAGE) 1000 MG tablet TAKE 1 TABLET (1,000 MG TOTAL) BY MOUTH TWICE A DAY WITH FOOD   montelukast (SINGULAIR) 10 MG tablet TAKE 1 TABLET BY MOUTH EVERYDAY AT BEDTIME (Patient taking differently: Take 10 mg by mouth at bedtime.)   Multiple Vitamins-Minerals (EYE HEALTH) CAPS Take 1 capsule by mouth daily.   Nutritional Supplements (GLUCOSE MANAGEMENT PO) Take by mouth.   nystatin-triamcinolone (MYCOLOG II) cream Apply 1 Application topically as needed.   Omega-3 Fatty Acids (OMEGA 3 500 PO) Take 1 capsule by mouth in the morning and at bedtime.   Polyvinyl Alcohol-Povidone (REFRESH OP) Place 1 drop into both eyes daily.    valsartan (DIOVAN) 320 MG tablet Take 1 tablet (320 mg total) by mouth daily.   Vitamin D-Vitamin K (VITAMIN K2-VITAMIN D3 PO) Take 1 capsule by mouth daily with lunch. D3 2000iu/  K2500   vitamin E 200 UNIT capsule Take 400 Units by mouth daily with lunch.   No facility-administered encounter medications on file as of 03/16/2023.    Allergies (verified) Ace inhibitors, Crestor [rosuvastatin], Wellbutrin [bupropion hcl], Cymbalta [duloxetine hcl], Lipitor [atorvastatin], Pregabalin, and Tegretol [carbamazepine]   History: Past Medical History:  Diagnosis Date   Allergic rhinitis    Arthritis    hands and knees   Chronic constipation    Environmental and seasonal allergies    GERD (gastroesophageal reflux disease)    [name] EGD - mild esophageal dysmotility and small hiatal hernia   Hiatal hernia    History of esophageal stricture 2013   s/p dilatation   History of vertebral compression fracture 07/18/2014   S/p kyphoplasty T12, T7 and T8    Hyperlipidemia    mild, diet controlled   Hypertension    Hypothyroidism    Macular degeneration of both eyes    Migraines    Moderate persistent asthma without complication    pulmonologist--- dr c. young   Osteoporosis 07/24/2014   w/  hx compression fx  T12,  T7 and T8 s/p kyphoplasty   Prolapse of vaginal walls    anterior and posterior   Type 2 diabetes mellitus (HCC)    followed by pcp   (04-15-2022  pt stated she check blood sugar daily in am fasting,  average 106--117)   Uterovaginal prolapse, incomplete    Wears glasses    Past Surgical History:  Procedure Laterality Date   72 HOUR PH STUDY N/A 04/11/2017   Procedure: 24 HOUR PH STUDY;  Surgeon: Napoleon Form, MD;  Location: [facility];  Service: Endoscopy;  Laterality: N/A;   CATARACT EXTRACTION W/ INTRAOCULAR LENS IMPLANT Bilateral 2003   COLONOSCOPY  2017   COLPOCLEISIS N/A 04/19/2022  Procedure: Ignatius Specking COLPOCLEISIS;  Surgeon: Marguerita Beards, MD;  Location: [facility];  Service: Gynecology;  Laterality: N/A;   CYSTOSCOPY N/A 04/19/2022   Procedure: CYSTOSCOPY;  Surgeon: Marguerita Beards, MD;  Location: [facility];  Service: Gynecology;  Laterality: N/A;   DILATION AND CURETTAGE OF UTERUS  1978   ESOPHAGEAL MANOMETRY N/A 04/11/2017   Procedure: ESOPHAGEAL MANOMETRY (EM);  Surgeon: Napoleon Form, MD;  Location: [facility];  Service: Endoscopy;  Laterality: N/A;   ESOPHAGOGASTRODUODENOSCOPY  06/25/2011   Procedure: ESOPHAGOGASTRODUODENOSCOPY (EGD);  Surgeon: Hart Carwin, MD;  Location: [facility];  Service: Endoscopy;  Laterality: N/A;  no Xray   KYPHOPLASTY N/A 08/30/2014   Procedure: THORACIC TWELVE KYPHOPLASTY;  Surgeon: [name], MD   KYPHOPLASTY N/A 01/22/2021   Procedure: Thoracic eight, Thoracic nine KYPHOPLASTY;  Surgeon: Coletta Memos, MD;  Location: [facility];  Service: Neurosurgery;  Laterality: N/A;   LAPAROSCOPY ABDOMEN DIAGNOSTIC     yrs  ago;    To R/O endometriosis   PH IMPEDANCE STUDY N/A 04/11/2017   Procedure: PH IMPEDANCE STUDY;  Surgeon: Napoleon Form, MD;  Location: [facility];  Service: Endoscopy;  Laterality: N/A;   RECTOCELE REPAIR N/A 04/19/2022   Procedure: Levator plication with perineorrhaphy;  Surgeon: Marguerita Beards, MD;  Location: [facility];  Service: Gynecology;  Laterality: N/A;   SAVORY DILATION  06/25/2011   Procedure: SAVORY DILATION;  Surgeon: Hart Carwin, MD;  Location: [facility];  Service: Endoscopy;  Laterality: N/A;   TONSILLECTOMY AND ADENOIDECTOMY  1962   Family History  Problem Relation Age of Onset   Mitral valve prolapse Father    Diabetes Father    Mitral valve prolapse Mother    Hypertension Mother    Stroke Mother 57       after valve surgery   Parkinson's disease Brother    Other Brother        POST WAR TRAUMA   Stroke Paternal Grandfather    Heart failure Maternal Grandfather    Cancer Neg Hx    Colon cancer Neg Hx    Stomach cancer Neg Hx    Esophageal cancer Neg Hx    Pancreatic cancer Neg Hx    Liver disease Neg Hx    Social History   Socioeconomic History   Marital status: Widowed    Spouse name: Not on file   Number of children: 3   Years of education: Not on file   Highest education level: Not on file  Occupational History   Occupation: Retired  Tobacco Use   Smoking status: Never    Passive exposure: Never   Smokeless tobacco: Never  Vaping Use   Vaping status: Never Used  Substance and Sexual Activity   Alcohol use: No    Alcohol/week: 0.0 standard drinks of alcohol   Drug use: Never   Sexual activity: Not Currently  Other Topics Concern   Not on file  Social History Narrative   Caffeine: occasional coffee   Lives alone, widower, 1 cat   Occupation: retired, Diplomatic Services operational officer at [employer]    Edu: some college   Act: works 3d/wk Chemical engineer), lives in [place], gardens, walks   Diet: good water, fruits/vegetables daily      HCPOA  is [place] [phone] (cell)   Social Drivers of Health   Financial Resource Strain: Low Risk  (03/16/2023)   Overall Financial Resource Strain (CARDIA)    Difficulty of Paying Living  Expenses: Not hard at all  Food Insecurity: No Food Insecurity (03/16/2023)   Hunger Vital Sign    Worried About Running Out of Food in the Last Year: Never true    Ran Out of Food in the Last Year: Never true  Transportation Needs: No Transportation Needs (03/16/2023)   PRAPARE - Administrator, Civil Service (Medical): No    Lack of Transportation (Non-Medical): No  Physical Activity: Inactive (03/16/2023)   Exercise Vital Sign    Days of Exercise per Week: 0 days    Minutes of Exercise per Session: 0 min  Stress: No Stress Concern Present (03/16/2023)   [employer] of Occupational Health - Occupational Stress Questionnaire    Feeling of Stress : Not at all  Social Connections: Moderately Isolated (03/16/2023)   Social Connection and Isolation Panel [NHANES]    Frequency of Communication with Friends and Family: More than three times a week    Frequency of Social Gatherings with Friends and Family: Once a week    Attends Religious Services: More than 4 times per year    Active Member of [employer] or Organizations: No    Attends Banker Meetings: Never    Marital Status: Widowed   Tobacco Counseling Counseling given: Not Answered  Clinical Intake:  Pre-visit preparation completed: No  Pain : No/denies pain Pain Score: 0-No pain    BMI - recorded: 23.49 Nutritional Status: BMI of 19-24  Normal Nutritional Risks: None Diabetes: No  How often do you need to have someone help you when you read instructions, pamphlets, or other written materials from your doctor or pharmacy?: 1 - Never  Interpreter Needed?: No  Comments: lives alone Information entered by :: B.Terrah Decoster,LPN   Activities of Daily Living    03/15/2023    6:32 PM 04/19/2022    9:04  AM  In your present state of health, do you have any difficulty performing the following activities:  Hearing? 0 0  Vision? 0 0  Difficulty concentrating or making decisions? 0 0  Walking or climbing stairs? 0 0  Dressing or bathing? 0 0  Doing errands, shopping? 0   Preparing Food and eating ? N   Using the Toilet? N   In the past six months, have you accidently leaked urine? Y   Do you have problems with loss of bowel control? Y   Managing your Medications? N   Managing your Finances? N   Housekeeping or managing your Housekeeping? N     Patient Care Team: Tower, Audrie Gallus, MD as PCP - General (Family Medicine) Lyn Records, MD (Inactive) as PCP - Cardiology (Cardiology) Eber Jones, MD as Referring Physician (Ophthalmology) Waymon Budge, MD as Consulting Physician (Pulmonary Disease) Meylor, August Saucer, DC as Consulting Physician (Chiropractic Medicine) Richrd Humbles, DDS as Consulting Physician (Dentistry)  Indicate any recent Medical Services you may have received from other than [facility] providers in the past year (date may be approximate).     Assessment:   This is a routine wellness examination for Jordan Patterson.  Hearing/Vision screen Hearing Screening - Comments:: Pt says her hearing is good Vision Screening - Comments:: Pt says her vision is good with glasses/macular degeneration [facility]   Goals Addressed               This Visit's Progress     Increase physical activity   Not on track     03/16/23-Weather permitting, I will continue to  walk at least 30 minutes 5-6 days per week.       COMPLETED: Maintain healthy lifestyle (pt-stated)   On track     Stay active Stay hydrated Healthy diet      Patient Stated   On track     03/05/2019, I will maintain and continue medications as prescribed.      Patient Stated   On track     03/12/2020, I will maintain and continue medications as prescribed.       Patient Stated   Not on track      03/16/23-Would like to maintain drinking more water and eating healthier       Depression Screen    03/16/2023    2:41 PM 08/25/2022   10:35 AM 03/31/2022   11:01 AM 03/15/2022   11:25 AM 08/13/2021    8:07 AM 03/13/2021   11:23 AM 03/12/2020   11:09 AM  PHQ 2/9 Scores  PHQ - 2 Score 0 0 0 0 0 0 0  PHQ- 9 Score  3 1    0    Fall Risk    03/15/2023    6:32 PM 08/25/2022   10:36 AM 03/31/2022   11:01 AM 03/15/2022   11:27 AM 08/13/2021    8:07 AM  Fall Risk   Falls in the past year? 0 0 0 0 0  Number falls in past yr: 0 0 0 0   Injury with Fall? 0 0 0 0   Risk for fall due to : No Fall Risks No Fall Risks No Fall Risks    Follow up Education provided;Falls prevention discussed Falls evaluation completed Falls evaluation completed Falls evaluation completed;Falls prevention discussed Falls evaluation completed    MEDICARE RISK AT HOME: Medicare Risk at Home Any stairs in or around the home?: (Patient-Rptd) Yes If so, are there any without handrails?: (Patient-Rptd) No Home free of loose throw rugs in walkways, pet beds, electrical cords, etc?: (Patient-Rptd) Yes Adequate lighting in your home to reduce risk of falls?: (Patient-Rptd) Yes Life alert?: (Patient-Rptd) No Use of a cane, walker or w/c?: (Patient-Rptd) No Grab bars in the bathroom?: (Patient-Rptd) Yes Shower chair or bench in shower?: (Patient-Rptd) Yes Elevated toilet seat or a handicapped toilet?: (Patient-Rptd) Yes  TIMED UP AND GO:  Was the test performed?  No    Cognitive Function:    03/12/2020   11:13 AM 03/05/2019    3:40 PM 02/27/2018    8:58 AM 02/09/2017    8:38 AM 02/09/2016    9:46 AM  MMSE - Mini Mental State Exam  Orientation to time 5 5 5 5 5   Orientation to Place 5 5 5 5 5   Registration 3 3 3 3 3   Attention/ Calculation 5 5 0 0 0  Recall 3 3 3 3 3   Language- name 2 objects   0 0 0  Language- repeat 1 1 1 1 1   Language- follow 3 step command   3 3 3   Language- read & follow direction   0 0 0   Write a sentence   0 0 0  Copy design   0 0 0  Total score   20 20 20         03/16/2023    2:47 PM  6CIT Screen  What Year? 0 points  What month? 0 points  What time? 0 points  Count back from 20 0 points  Months in reverse 0 points  Repeat phrase 0 points  Total  Score 0 points    Immunizations Immunization History  Administered Date(s) Administered   Fluad Quad(high Dose 65+) 12/08/2019, 12/15/2020, 12/14/2021   Fluad Trivalent(High Dose 65+) 12/10/2022   Influenza Whole 11/23/2011, 12/06/2012   Influenza, High Dose Seasonal PF 11/23/2018, 12/08/2019   Influenza,inj,Quad PF,6+ Mos 11/06/2014, 10/21/2015, 11/23/2016, 11/23/2017, 11/09/2018   Influenza-Unspecified 11/22/2013   PFIZER Comirnaty(Gray Top)Covid-19 Tri-Sucrose Vaccine 06/01/2019, 06/27/2019, 03/30/2020   PFIZER(Purple Top)SARS-COV-2 Vaccination 06/01/2019, 06/27/2019, 03/30/2020   Pneumococcal Conjugate-13 05/29/2015   Pneumococcal Polysaccharide-23 10/23/2005   Respiratory Syncytial Virus Vaccine,Recomb Aduvanted(Arexvy) 02/24/2022   Td 01/14/2012    TDAP status: Up to date  Flu Vaccine status: Up to date  Pneumococcal vaccine status: Up to date  Covid-19 vaccine status: Completed vaccines  Qualifies for Shingles Vaccine? Yes   Zostavax completed No   Shingrix Completed?: No.    Education has been provided regarding the importance of this vaccine. Patient has been advised to call insurance company to determine out of pocket expense if they have not yet received this vaccine. Advised may also receive vaccine at local pharmacy or Health Dept. Verbalized acceptance and understanding.  Screening Tests Health Maintenance  Topic Date Due   Zoster Vaccines- Shingrix (1 of 2) Never done   COVID-19 Vaccine (7 - 2024-25 season) 10/24/2022   HEMOGLOBIN A1C  02/25/2023   Diabetic kidney evaluation - Urine ACR  03/25/2023   FOOT EXAM  04/01/2023   OPHTHALMOLOGY EXAM  06/07/2023   Diabetic kidney evaluation -  eGFR measurement  11/04/2023   Medicare Annual Wellness (AWV)  03/15/2024   Pneumonia Vaccine 17+ Years old  Completed   INFLUENZA VACCINE  Completed   DEXA SCAN  Completed   HPV VACCINES  Aged Out   DTaP/Tdap/Td  Discontinued    Health Maintenance  Health Maintenance Due  Topic Date Due   Zoster Vaccines- Shingrix (1 of 2) Never done   COVID-19 Vaccine (7 - 2024-25 season) 10/24/2022   HEMOGLOBIN A1C  02/25/2023   Diabetic kidney evaluation - Urine ACR  03/25/2023    Colorectal cancer screening: No longer required.   Mammogram status: No longer required due to age.  Bone Density status: Completed 09/04/2021. Results reflect: Bone density results: OSTEOPOROSIS. Repeat every 2-3 years.  Lung Cancer Screening: (Low Dose CT Chest recommended if Age 49-80 years, 20 pack-year currently smoking OR have quit w/in 15years.) does not qualify.   Lung Cancer Screening Referral: no  Additional Screening:  Hepatitis C Screening: does not qualify; Completed no  Vision Screening: Recommended annual ophthalmology exams for early detection of glaucoma and other disorders of the eye. Is the patient up to date with their annual eye exam?  Yes  Who is the provider or what is the name of the office in which the patient attends annual eye exams? Dr Sherryll Burger If pt is not established with a provider, would they like to be referred to a provider to establish care? No .   Dental Screening: Recommended annual dental exams for proper oral hygiene  Diabetic Foot Exam: Diabetic Foot Exam: Overdue, Pt has been advised about the importance in completing this exam. Pt is scheduled for diabetic foot exam on Feb 2025 w/PCP.  Community Resource Referral / Chronic Care Management: CRR required this visit?  No   CCM required this visit?  No     Plan:     I have personally reviewed and noted the following in the patient's chart:   Medical and social history Use of alcohol, tobacco or illicit  drugs   Current medications and supplements including opioid prescriptions. Patient is not currently taking opioid prescriptions. Functional ability and status Nutritional status Physical activity Advanced directives List of other physicians Hospitalizations, surgeries, and ER visits in previous 12 months Vitals Screenings to include cognitive, depression, and falls Referrals and appointments  In addition, I have reviewed and discussed with patient certain preventive protocols, quality metrics, and best practice recommendations. A written personalized care plan for preventive services as well as general preventive health recommendations were provided to patient.     Sue Lush, LPN   02/28/2692   After Visit Summary: (MyChart) Due to this being a telephonic visit, the after visit summary with patients personalized plan was offered to patient via MyChart   Nurse Notes: The patient states she is doing well and has no concerns or questions at this time.

## 2023-03-16 NOTE — Patient Instructions (Signed)
Jordan Patterson , Thank you for taking time to come for your Medicare Wellness Visit. I appreciate your ongoing commitment to your health goals. Please review the following plan we discussed and let me know if I can assist you in the future.   Referrals/Orders/Follow-Ups/Clinician Recommendations: none  This is a list of the screening recommended for you and due dates:  Health Maintenance  Topic Date Due   Zoster (Shingles) Vaccine (1 of 2) Never done   COVID-19 Vaccine (7 - 2024-25 season) 10/24/2022   Hemoglobin A1C  02/25/2023   Yearly kidney health urinalysis for diabetes  03/25/2023   Complete foot exam   04/01/2023   Eye exam for diabetics  06/07/2023   Yearly kidney function blood test for diabetes  11/04/2023   Medicare Annual Wellness Visit  03/15/2024   Pneumonia Vaccine  Completed   Flu Shot  Completed   DEXA scan (bone density measurement)  Completed   HPV Vaccine  Aged Out   DTaP/Tdap/Td vaccine  Discontinued    Advanced directives: (Copy Requested) Please bring a copy of your health care power of attorney and living will to the office to be added to your chart at your convenience.  Next Medicare Annual Wellness Visit scheduled for next year: Yes 03/16/2024 @ 1:40pm televisit

## 2023-03-26 ENCOUNTER — Other Ambulatory Visit: Payer: Self-pay | Admitting: Family Medicine

## 2023-03-27 ENCOUNTER — Other Ambulatory Visit: Payer: Self-pay | Admitting: Family Medicine

## 2023-03-28 DIAGNOSIS — S233XXA Sprain of ligaments of thoracic spine, initial encounter: Secondary | ICD-10-CM | POA: Diagnosis not present

## 2023-03-28 DIAGNOSIS — M9902 Segmental and somatic dysfunction of thoracic region: Secondary | ICD-10-CM | POA: Diagnosis not present

## 2023-04-11 DIAGNOSIS — S233XXA Sprain of ligaments of thoracic spine, initial encounter: Secondary | ICD-10-CM | POA: Diagnosis not present

## 2023-04-11 DIAGNOSIS — M9902 Segmental and somatic dysfunction of thoracic region: Secondary | ICD-10-CM | POA: Diagnosis not present

## 2023-04-13 ENCOUNTER — Encounter: Payer: Medicare PPO | Admitting: Family Medicine

## 2023-04-20 ENCOUNTER — Ambulatory Visit (INDEPENDENT_AMBULATORY_CARE_PROVIDER_SITE_OTHER): Payer: Medicare PPO | Admitting: Family Medicine

## 2023-04-20 ENCOUNTER — Encounter: Payer: Self-pay | Admitting: Family Medicine

## 2023-04-20 VITALS — BP 130/80 | HR 88 | Temp 98.4°F | Ht 64.5 in | Wt 157.1 lb

## 2023-04-20 DIAGNOSIS — E1169 Type 2 diabetes mellitus with other specified complication: Secondary | ICD-10-CM | POA: Diagnosis not present

## 2023-04-20 DIAGNOSIS — K219 Gastro-esophageal reflux disease without esophagitis: Secondary | ICD-10-CM | POA: Diagnosis not present

## 2023-04-20 DIAGNOSIS — Z0000 Encounter for general adult medical examination without abnormal findings: Secondary | ICD-10-CM | POA: Diagnosis not present

## 2023-04-20 DIAGNOSIS — Z7984 Long term (current) use of oral hypoglycemic drugs: Secondary | ICD-10-CM

## 2023-04-20 DIAGNOSIS — E039 Hypothyroidism, unspecified: Secondary | ICD-10-CM | POA: Diagnosis not present

## 2023-04-20 DIAGNOSIS — Z79899 Other long term (current) drug therapy: Secondary | ICD-10-CM | POA: Diagnosis not present

## 2023-04-20 DIAGNOSIS — E785 Hyperlipidemia, unspecified: Secondary | ICD-10-CM

## 2023-04-20 DIAGNOSIS — M810 Age-related osteoporosis without current pathological fracture: Secondary | ICD-10-CM

## 2023-04-20 DIAGNOSIS — I10 Essential (primary) hypertension: Secondary | ICD-10-CM

## 2023-04-20 DIAGNOSIS — E119 Type 2 diabetes mellitus without complications: Secondary | ICD-10-CM | POA: Diagnosis not present

## 2023-04-20 LAB — CBC WITH DIFFERENTIAL/PLATELET
Basophils Absolute: 0 10*3/uL (ref 0.0–0.1)
Basophils Relative: 0.8 % (ref 0.0–3.0)
Eosinophils Absolute: 0.1 10*3/uL (ref 0.0–0.7)
Eosinophils Relative: 1.2 % (ref 0.0–5.0)
HCT: 39.7 % (ref 36.0–46.0)
Hemoglobin: 13.2 g/dL (ref 12.0–15.0)
Lymphocytes Relative: 40.7 % (ref 12.0–46.0)
Lymphs Abs: 2 10*3/uL (ref 0.7–4.0)
MCHC: 33.2 g/dL (ref 30.0–36.0)
MCV: 92.7 fL (ref 78.0–100.0)
Monocytes Absolute: 0.4 10*3/uL (ref 0.1–1.0)
Monocytes Relative: 7.8 % (ref 3.0–12.0)
Neutro Abs: 2.4 10*3/uL (ref 1.4–7.7)
Neutrophils Relative %: 49.5 % (ref 43.0–77.0)
Platelets: 203 10*3/uL (ref 150.0–400.0)
RBC: 4.28 Mil/uL (ref 3.87–5.11)
RDW: 13.1 % (ref 11.5–15.5)
WBC: 4.9 10*3/uL (ref 4.0–10.5)

## 2023-04-20 LAB — COMPREHENSIVE METABOLIC PANEL
ALT: 18 U/L (ref 0–35)
AST: 19 U/L (ref 0–37)
Albumin: 4.4 g/dL (ref 3.5–5.2)
Alkaline Phosphatase: 44 U/L (ref 39–117)
BUN: 14 mg/dL (ref 6–23)
CO2: 30 meq/L (ref 19–32)
Calcium: 9.5 mg/dL (ref 8.4–10.5)
Chloride: 93 meq/L — ABNORMAL LOW (ref 96–112)
Creatinine, Ser: 0.69 mg/dL (ref 0.40–1.20)
GFR: 80.77 mL/min (ref >60.00)
Glucose, Bld: 86 mg/dL (ref 70–99)
Potassium: 3.7 meq/L (ref 3.5–5.1)
Sodium: 133 meq/L — ABNORMAL LOW (ref 135–145)
Total Bilirubin: 0.4 mg/dL (ref 0.2–1.2)
Total Protein: 7.1 g/dL (ref 6.0–8.3)

## 2023-04-20 LAB — LIPID PANEL
Cholesterol: 252 mg/dL — ABNORMAL HIGH (ref 0–200)
HDL: 74.6 mg/dL (ref >39.00)
LDL Cholesterol: 155 mg/dL — ABNORMAL HIGH (ref 0–99)
NonHDL: 177.44
Total CHOL/HDL Ratio: 3
Triglycerides: 114 mg/dL (ref 0.0–149.0)
VLDL: 22.8 mg/dL (ref 0.0–40.0)

## 2023-04-20 LAB — VITAMIN B12: Vitamin B-12: 309 pg/mL (ref 211–911)

## 2023-04-20 LAB — HEMOGLOBIN A1C: Hgb A1c MFr Bld: 6.5 % (ref 4.6–6.5)

## 2023-04-20 LAB — TSH: TSH: 2.72 u[IU]/mL (ref 0.35–5.50)

## 2023-04-20 LAB — VITAMIN D 25 HYDROXY (VIT D DEFICIENCY, FRACTURES): VITD: 61.01 ng/mL (ref 30.00–100.00)

## 2023-04-20 NOTE — Assessment & Plan Note (Signed)
 A1c today  Microalb today  Normal foot exam [facility]   Eating well

## 2023-04-20 NOTE — Assessment & Plan Note (Addendum)
 Due dexa in July  Strongly encouraged her to consider getting this History of spinal comp fracture and also kyphosis  Discussed quality of life issues with fracture risk  Encouraged to consider medication for bones   Discussed fall prevention, supplements and exercise for bone density  Encouraged more strength building exercise   D level with lab today

## 2023-04-20 NOTE — Patient Instructions (Addendum)
 You will be due for dexa (bone density test)   in July  If you want to do that give [place] a call in may or June   Keep walking Add some strength training to your routine, this is important for bone and brain health and can reduce your risk of falls and help your body use insulin properly and regulate weight  Light weights, exercise bands , and internet videos are a good way to start  Yoga (chair or regular), machines , floor exercises or a gym with machines are also good options    Take your glucose meter to the pharmacist  If you need a new one they can let us know    Sources of protein  The following are examples of protein in diet  Meat  Fish  Eggs  Dairy products  Soy products  Oat milk  Almond milk Nuts and nut butters  Dried beans

## 2023-04-20 NOTE — Assessment & Plan Note (Signed)
 BP is improved with current medicines  BP: 130/80   Chlorthalidone 12.5 mg daily  Amlodipine 10 mg daily (inc from 50  Valsartan 320 mg daily   Tolerating well  Lab today

## 2023-04-20 NOTE — Progress Notes (Signed)
 Subjective:    Patient ID: Jordan Patterson, female    DOB: Jul 01, 1940, 83 y.o.   MRN: [id number]  HPI  Here for health maintenance exam and to review chronic medical problems   Wt Readings from Last 3 Encounters:  04/20/23 157 lb 2 oz (71.3 kg)  03/16/23 150 lb (68 kg)  11/04/22 150 lb (68 kg)   26.55 kg/m  Weight is back up / happy about that   She originally ate a lot of peanut butter - gave he loose bowels   Vitals:   04/20/23 1108  BP: 130/80  Pulse: 88  Temp: 98.4 F (36.9 C)  SpO2: 98%    Immunization History  Administered Date(s) Administered   Fluad Quad(high Dose 65+) 12/08/2019, 12/15/2020, 12/14/2021   Fluad Trivalent(High Dose 65+) 12/10/2022   Influenza Whole 11/23/2011, 12/06/2012   Influenza, High Dose Seasonal PF 11/23/2018, 12/08/2019   Influenza,inj,Quad PF,6+ Mos 11/06/2014, 10/21/2015, 11/23/2016, 11/23/2017, 11/09/2018   Influenza-Unspecified 11/22/2013   PFIZER Comirnaty(Gray Top)Covid-19 Tri-Sucrose Vaccine 06/01/2019, 06/27/2019, 03/30/2020   PFIZER(Purple Top)SARS-COV-2 Vaccination 06/01/2019, 06/27/2019, 03/30/2020   Pneumococcal Conjugate-13 05/29/2015   Pneumococcal Polysaccharide-23 10/23/2005   Respiratory Syncytial Virus Vaccine,Recomb Aduvanted(Arexvy) 02/24/2022   Td 01/14/2012    Health Maintenance Due  Topic Date Due   HEMOGLOBIN A1C  02/25/2023   Diabetic kidney evaluation - Urine ACR  03/25/2023   Declines shingrix   Mammogram- declines due to age Self breast exam-no lumps  Declines breast exam   Gyn health No problems  Doing great after urogyn surgery    Colon cancer screening -out aged   Bone health  Dexa 08/2021 -unsure if she wants another one  Osteoporosis  Falls-none recent  Fractures-past compression fracture of spine  Supplements -vit D  Last vitamin D Lab Results  Component Value Date   VD25OH 65.30 04/30/2021    Exercise  Walking when weather is good  Walks inside when weather is bad      Mood    04/20/2023   11:16 AM 03/16/2023    2:41 PM 08/25/2022   10:35 AM 03/31/2022   11:01 AM 03/15/2022   11:25 AM  Depression screen PHQ 2/9  Decreased Interest 0 0 0 0 0  Down, Depressed, Hopeless 0 0 0 0 0  PHQ - 2 Score 0 0 0 0 0  Altered sleeping 0  0 1   Tired, decreased energy 0  0 0   Change in appetite 2  3 0   Feeling bad or failure about yourself  0  0 0   Trouble concentrating 0  0 0   Moving slowly or fidgety/restless 0  0 0   Suicidal thoughts 0  0 0   PHQ-9 Score 2  3 1    Difficult doing work/chores Not difficult at all  Not difficult at all Not difficult at all     HTN bp is stable today  No cp or palpitations or headaches or edema  No side effects to medicines  BP Readings from Last 3 Encounters:  04/20/23 130/80  11/04/22 (!) 142/78  10/27/22 130/62    Chlorthalidone 12.5 mg daily  Amlodipine 10 mg  Valsartan 320 mg   Lab Results  Component Value Date   NA 133 (L) 11/04/2022   K 4.0 11/04/2022   CO2 30 11/04/2022   GLUCOSE 129 (H) 11/04/2022   BUN 20 11/04/2022   CREATININE 0.78 11/04/2022   CALCIUM 9.6 11/04/2022   GFR 70.92 11/04/2022   EGFR 76  12/10/2020   GFRNONAA >60 01/20/2021    DM2 Lab Results  Component Value Date   HGBA1C 6.6 (H) 08/25/2022   HGBA1C 6.6 (H) 03/24/2022   HGBA1C 7.5 (H) 12/28/2021   Metformin 1000 mg bid  No concerns for high or low glucose Checks it intermittently      Lab Results  Component Value Date   MICROALBUR 10.3 (H) 03/24/2022   MICROALBUR 1.6 03/06/2019    Hyperlipidemia Lab Results  Component Value Date   CHOL 225 (H) 03/24/2022   HDL 56.90 03/24/2022   LDLCALC 142 (H) 03/24/2022   LDLDIRECT 164.0 03/17/2020   TRIG 131.0 03/24/2022   CHOLHDL 4 03/24/2022   Has fear of statin     GERD Pepcid 20 mg daily  Nexium 40 mg daily  Sees gi  Lab Results  Component Value Date   VITAMINB12 1,145 (H) 03/17/2020     Hypothyroidism  Pt has no clinical changes No change in energy  level/ hair or skin/ edema and no tremor Lab Results  Component Value Date   TSH 3.87 08/25/2022    Levothyroxine 100 mcg daily       Patient Active Problem List   Diagnosis Date Noted   Routine general medical examination at a health care facility 04/20/2023   Hearing loss secondary to cerumen impaction, bilateral 10/27/2022   Preoperative respiratory examination 04/05/2022   Frequent urination 02/09/2022   Constipation 12/14/2021   Preoperative cardiovascular examination 11/09/2021   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   Estrogen deficiency 04/07/2021   Thoracic compression fracture (HCC) 01/22/2021   Current use of proton pump inhibitor 03/11/2020   Chronic midline low back pain without sciatica 03/12/2019   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   Hyponatremia 12/14/2018   Laryngopharyngeal reflux (LPR) 08/24/2017   Osteoporosis 08/24/2017   Leg cramping 08/21/2017   Regurgitation of food    Prolapse of female pelvic organs 02/19/2017   History of compression fracture of spine 07/18/2014   Hypertensive retinopathy of both eyes 06/04/2014   Nonexudative age-related macular degeneration 06/04/2014   Posterior vitreous detachment of both eyes 06/04/2014   Pseudophakia of both eyes 06/04/2014   Right knee pain 05/30/2014   Advanced care planning/counseling discussion 01/24/2014   Health maintenance examination 01/24/2014   Dysgeusia 11/12/2013   Upper airway cough syndrome 03/19/2013   Allergic conjunctivitis and rhinitis 05/15/2012   Asthma, moderate persistent 03/09/2012   Medicare annual wellness visit, subsequent 01/14/2012   Hiatal hernia    GERD (gastroesophageal reflux disease)    Essential hypertension    Hyperlipidemia associated with type 2 diabetes mellitus (HCC)    Depression    Hypothyroidism    Diabetes type 2, controlled (HCC)    Osteoarthritis    Past Medical History:  Diagnosis Date   Allergic rhinitis    Arthritis    hands and knees   Chronic constipation     Environmental and seasonal allergies    GERD (gastroesophageal reflux disease)    [name] EGD - mild esophageal dysmotility and small hiatal hernia   Hiatal hernia    History of esophageal stricture 2013   s/p dilatation   History of vertebral compression fracture 07/18/2014   S/p kyphoplasty T12, T7 and T8   Hyperlipidemia    mild, diet controlled   Hypertension    Hypothyroidism    Macular degeneration of both eyes    Migraines    Moderate persistent asthma without complication    pulmonologist--- dr c. young   Osteoporosis 07/24/2014   w/  hx compression fx  T12,  T7 and T8 s/p kyphoplasty   Prolapse of vaginal walls    anterior and posterior   Type 2 diabetes mellitus (HCC)    followed by pcp   (04-15-2022  pt stated she check blood sugar daily in am fasting,  average 106--117)   Uterovaginal prolapse, incomplete    Wears glasses    Past Surgical History:  Procedure Laterality Date   70 HOUR PH STUDY N/A 04/11/2017   Procedure: 24 HOUR PH STUDY;  Surgeon: Napoleon Form, MD;  Location: [facility];  Service: Endoscopy;  Laterality: N/A;   CATARACT EXTRACTION W/ INTRAOCULAR LENS IMPLANT Bilateral 2003   COLONOSCOPY  2017   COLPOCLEISIS N/A 04/19/2022   Procedure: Ignatius Specking COLPOCLEISIS;  Surgeon: Marguerita Beards, MD;  Location[facility];  Service: Gynecology;  Laterality: N/A;   CYSTOSCOPY N/A 04/19/2022   Procedure: CYSTOSCOPY;  Surgeon: Marguerita Beards, MD;  Location: [facility];  Service: Gynecology;  Laterality: N/A;   DILATION AND CURETTAGE OF UTERUS  1978   ESOPHAGEAL MANOMETRY N/A 04/11/2017   Procedure: ESOPHAGEAL MANOMETRY (EM);  Surgeon: Napoleon Form, MD;  Location: [facility];  Service: Endoscopy;  Laterality: N/A;   ESOPHAGOGASTRODUODENOSCOPY  06/25/2011   Procedure: ESOPHAGOGASTRODUODENOSCOPY (EGD);  Surgeon: Hart Carwin, MD;  Location: [facility];  Service: Endoscopy;  Laterality: N/A;  no Xray    KYPHOPLASTY N/A 08/30/2014   Procedure: THORACIC TWELVE KYPHOPLASTY;  Surgeon: [name], MD   KYPHOPLASTY N/A 01/22/2021   Procedure: Thoracic eight, Thoracic nine KYPHOPLASTY;  Surgeon: Coletta Memos, MD;  Location: [facility];  Service: Neurosurgery;  Laterality: N/A;   LAPAROSCOPY ABDOMEN DIAGNOSTIC     yrs ago;    To R/O endometriosis   PH IMPEDANCE STUDY N/A 04/11/2017   Procedure: PH IMPEDANCE STUDY;  Surgeon: Napoleon Form, MD;  Location: [facility];  Service: Endoscopy;  Laterality: N/A;   RECTOCELE REPAIR N/A 04/19/2022   Procedure: Levator plication with perineorrhaphy;  Surgeon: Marguerita Beards, MD;  Location: [facility];  Service: Gynecology;  Laterality: N/A;   SAVORY DILATION  06/25/2011   Procedure: SAVORY DILATION;  Surgeon: Hart Carwin, MD;  Location: [facility];  Service: Endoscopy;  Laterality: N/A;   TONSILLECTOMY AND ADENOIDECTOMY  1962   Social History   Tobacco Use   Smoking status: Never    Passive exposure: Never   Smokeless tobacco: Never  Vaping Use   Vaping status: Never Used  Substance Use Topics   Alcohol use: No    Alcohol/week: 0.0 standard drinks of alcohol   Drug use: Never   Family History  Problem Relation Age of Onset   Mitral valve prolapse Father    Diabetes Father    Mitral valve prolapse Mother    Hypertension Mother    Stroke Mother 67       after valve surgery   Parkinson's disease Brother    Other Brother        POST WAR TRAUMA   Stroke Paternal Grandfather    Heart failure Maternal Grandfather    Cancer Neg Hx    Colon cancer Neg Hx    Stomach cancer Neg Hx    Esophageal cancer Neg Hx    Pancreatic cancer Neg Hx    Liver disease Neg Hx    Allergies  Allergen Reactions   Ace Inhibitors Cough   Crestor [Rosuvastatin] Other (See Comments)    myalgias   Wellbutrin [  Bupropion Hcl] Other (See Comments)    Unknown reaction   Cymbalta [Duloxetine Hcl] Palpitations and Other (See Comments)     headaches   Lipitor [Atorvastatin] Cough   Pregabalin Other (See Comments)    Unknown reaction   Tegretol [Carbamazepine] Other (See Comments)    Dizziness, headache   Current Outpatient Medications on File Prior to Visit  Medication Sig Dispense Refill   Accu-Chek Softclix Lancets lancets Use to check blood sugar daily (dx. E11.9) 100 each 1   acetaminophen (TYLENOL) 500 MG tablet Take 1 tablet (500 mg total) by mouth every 6 (six) hours as needed (pain). 30 tablet 0   albuterol (VENTOLIN HFA) 108 (90 Base) MCG/ACT inhaler TAKE 2 PUFFS BY MOUTH EVERY 6 HOURS AS NEEDED FOR WHEEZE OR SHORTNESS OF BREATH (Patient taking differently: Inhale 2 puffs into the lungs every 6 (six) hours as needed for wheezing or shortness of breath. TAKE 2 PUFFS BY MOUTH EVERY 6 HOURS AS NEEDED FOR WHEEZE OR SHORTNESS OF BREATH) 18 each 2   amLODipine (NORVASC) 10 MG tablet TAKE 1 TABLET BY MOUTH EVERY DAY 90 tablet 0   benzonatate (TESSALON) 200 MG capsule Take 1 capsule (200 mg total) by mouth 3 (three) times daily as needed for cough. 180 capsule 3   blood glucose meter kit and supplies Dispense based on patient and insurance preference. Use qd and prn for uncontrolled diabetes. (FOR ICD-10 E10.9, E11.9). 1 each 1   Budeson-Glycopyrrol-Formoterol (BREZTRI AEROSPHERE) 160-9-4.8 MCG/ACT AERO Inhale 2 puffs then rinse mouth, twice daily 32.1 g 4   Calcium 250 MG CAPS Take 500 mg by mouth at bedtime.     chlorpheniramine (CHLOR-TRIMETON) 4 MG tablet Take 4 mg by mouth in the morning, at noon, and at bedtime.     chlorthalidone (HYGROTON) 25 MG tablet Take 0.5 tablets (12.5 mg total) by mouth daily. 90 tablet 2   esomeprazole (NEXIUM) 40 MG capsule 1 capsule by mouth every day before breakfast 90 capsule 6   famotidine (PEPCID) 20 MG tablet TAKE 1 TABLET BY MOUTH EVERYDAY AT BEDTIME 90 tablet 3   ferrous sulfate 325 (65 FE) MG EC tablet Take 325 mg by mouth every Monday, Wednesday, and Friday. Takes in am     Garlic  (GARLIQUE PO) Take 1 tablet by mouth daily.     glucose blood test strip Use as instructed 100 each 12   levothyroxine (SYNTHROID) 100 MCG tablet TAKE 1 TABLET BY MOUTH EVERY DAY BEFORE BREAKFAST 90 tablet 2   MAGNESIUM LACTATE PO Take 2 tablets by mouth at bedtime. 210 mg each     metFORMIN (GLUCOPHAGE) 1000 MG tablet TAKE 1 TABLET (1,000 MG TOTAL) BY MOUTH TWICE A DAY WITH FOOD 180 tablet 0   montelukast (SINGULAIR) 10 MG tablet TAKE 1 TABLET BY MOUTH EVERYDAY AT BEDTIME (Patient taking differently: Take 10 mg by mouth at bedtime.) 90 tablet 4   Multiple Vitamins-Minerals (EYE HEALTH) CAPS Take 1 capsule by mouth daily.     Nutritional Supplements (GLUCOSE MANAGEMENT PO) Take by mouth.     nystatin-triamcinolone (MYCOLOG II) cream Apply 1 Application topically as needed.     Omega-3 Fatty Acids (OMEGA 3 500 PO) Take 1 capsule by mouth in the morning and at bedtime.     Polyvinyl Alcohol-Povidone (REFRESH OP) Place 1 drop into both eyes daily.      valsartan (DIOVAN) 320 MG tablet Take 1 tablet (320 mg total) by mouth daily. 90 tablet 2   Vitamin D-Vitamin K (  VITAMIN K2-VITAMIN D3 PO) Take 1 capsule by mouth daily with lunch. D3 2000iu/  K2500     vitamin E 200 UNIT capsule Take 400 Units by mouth daily with lunch.     No current facility-administered medications on file prior to visit.    Review of Systems     Objective:   Physical Exam Constitutional:      General: She is not in acute distress.    Appearance: Normal appearance. She is well-developed. She is not ill-appearing or diaphoretic.  HENT:     Head: Normocephalic and atraumatic.     Right Ear: Tympanic membrane, ear canal and external ear normal.     Left Ear: Tympanic membrane, ear canal and external ear normal.     Nose: Nose normal. No congestion.     Mouth/Throat:     Mouth: Mucous membranes are moist.     Pharynx: Oropharynx is clear. No posterior oropharyngeal erythema.  Eyes:     General: No scleral icterus.     Extraocular Movements: Extraocular movements intact.     Conjunctiva/sclera: Conjunctivae normal.     Pupils: Pupils are equal, round, and reactive to light.  Neck:     Thyroid: No thyromegaly.     Vascular: No carotid bruit or JVD.  Cardiovascular:     Rate and Rhythm: Normal rate and regular rhythm.     Pulses: Normal pulses.     Heart sounds: Normal heart sounds.     No gallop.     Comments: Varicosities in ankles  Pulmonary:     Effort: Pulmonary effort is normal. No respiratory distress.     Breath sounds: Normal breath sounds. No wheezing.     Comments: Good air exch Chest:     Chest wall: No tenderness.  Abdominal:     General: Bowel sounds are normal. There is no distension or abdominal bruit.     Palpations: Abdomen is soft. There is no mass.     Tenderness: There is no abdominal tenderness.     Hernia: No hernia is present.  Genitourinary:    Comments: Declines breast exam Musculoskeletal:        General: No tenderness. Normal range of motion.     Cervical back: Normal range of motion and neck supple. No rigidity. No muscular tenderness.     Right lower leg: No edema.     Left lower leg: No edema.     Comments: Trace pedal edema   Moderate kyphosis   Lymphadenopathy:     Cervical: No cervical adenopathy.  Skin:    General: Skin is warm and dry.     Coloration: Skin is not pale.     Findings: No erythema or rash.     Comments: Solar lentigines diffusely Some angiomas   Neurological:     Mental Status: She is alert. Mental status is at baseline.     Cranial Nerves: No cranial nerve deficit.     Motor: No abnormal muscle tone.     Coordination: Coordination normal.     Gait: Gait normal.     Deep Tendon Reflexes: Reflexes are normal and symmetric.  Psychiatric:        Mood and Affect: Mood normal.        Cognition and Memory: Cognition and memory normal.           Assessment & Plan:   Problem List Items Addressed This Visit       Cardiovascular  and Mediastinum  Essential hypertension   BP is improved with current medicines  BP: 130/80   Chlorthalidone 12.5 mg daily  Amlodipine 10 mg daily (inc from 50  Valsartan 320 mg daily   Tolerating well  Lab today      Relevant Orders   CBC with Differential/Platelet   Comprehensive metabolic panel   TSH   Lipid Panel     Digestive   GERD (gastroesophageal reflux disease)   Continues nexium 40 mg daily  Pepcid 20 mg daily   For GI follow up soon        Endocrine   Hypothyroidism   TSH today  No clinical changes Levothyroxine 100 mcg daily      Relevant Orders   TSH   Hyperlipidemia associated with type 2 diabetes mellitus (HCC)   Disc goals for lipids and reasons to control them Rev last labs with pt Rev low sat fat diet in detail Lab today  Past statin myopathy         Relevant Orders   Comprehensive metabolic panel   Lipid Panel   Diabetes type 2, controlled (HCC)   A1c today  Microalb today  Normal foot exam [facility]   Eating well      Relevant Orders   Hemoglobin A1c   Microalbumin / creatinine urine ratio     Musculoskeletal and Integument   Osteoporosis   Due dexa in July  Strongly encouraged her to consider getting this History of spinal comp fracture and also kyphosis  Discussed quality of life issues with fracture risk  Encouraged to consider medication for bones   Discussed fall prevention, supplements and exercise for bone density  Encouraged more strength building exercise   D level with lab today      Relevant Orders   VITAMIN D 25 Hydroxy (Vit-D Deficiency, Fractures)     Other   Routine general medical examination at a health care facility - Primary   Reviewed health habits including diet and exercise and skin cancer prevention Reviewed appropriate screening tests for age  Also reviewed health mt list, fam hx and immunization status , as well as social and family history   See HPI Labs reviewed and  ordered Health Maintenance  Topic Date Due   Hemoglobin A1C  02/25/2023   Yearly kidney health urinalysis for diabetes  03/25/2023   Zoster (Shingles) Vaccine (1 of 2) 07/18/2023*   COVID-19 Vaccine (7 - 2024-25 season) 05/05/2024*   Eye exam for diabetics  06/07/2023   Yearly kidney function blood test for diabetes  11/04/2023   Medicare Annual Wellness Visit  03/15/2024   Complete foot exam   04/19/2024   Pneumonia Vaccine  Completed   Flu Shot  Completed   DEXA scan (bone density measurement)  Completed   HPV Vaccine  Aged Out   DTaP/Tdap/Td vaccine  Discontinued  *Topic was postponed. The date shown is not the original due date.   Declines shigrinx Declines mammo or breast exam Due dexa in 08/2023 Discussed fall prevention, supplements and exercise for bone density   PHQ 2 due to age related change in appetite       Current use of proton pump inhibitor   B12 level added to labs  Takes nexium 40 mg daily       Relevant Orders   Vitamin B12

## 2023-04-20 NOTE — Assessment & Plan Note (Signed)
 Continues nexium 40 mg daily  Pepcid 20 mg daily   For GI follow up soon

## 2023-04-20 NOTE — Assessment & Plan Note (Signed)
 Disc goals for lipids and reasons to control them Rev last labs with pt Rev low sat fat diet in detail Lab today  Past statin myopathy

## 2023-04-20 NOTE — Assessment & Plan Note (Signed)
 B12 level added to labs  Takes nexium 40 mg daily

## 2023-04-20 NOTE — Assessment & Plan Note (Signed)
 TSH today  No clinical changes Levothyroxine 100 mcg daily

## 2023-04-20 NOTE — Assessment & Plan Note (Signed)
 Reviewed health habits including diet and exercise and skin cancer prevention Reviewed appropriate screening tests for age  Also reviewed health mt list, fam hx and immunization status , as well as social and family history   See HPI Labs reviewed and ordered Health Maintenance  Topic Date Due   Hemoglobin A1C  02/25/2023   Yearly kidney health urinalysis for diabetes  03/25/2023   Zoster (Shingles) Vaccine (1 of 2) 07/18/2023*   COVID-19 Vaccine (7 - 2024-25 season) 05/05/2024*   Eye exam for diabetics  06/07/2023   Yearly kidney function blood test for diabetes  11/04/2023   Medicare Annual Wellness Visit  03/15/2024   Complete foot exam   04/19/2024   Pneumonia Vaccine  Completed   Flu Shot  Completed   DEXA scan (bone density measurement)  Completed   HPV Vaccine  Aged Out   DTaP/Tdap/Td vaccine  Discontinued  *Topic was postponed. The date shown is not the original due date.   Declines shigrinx Declines mammo or breast exam Due dexa in 08/2023 Discussed fall prevention, supplements and exercise for bone density   PHQ 2 due to age related change in appetite

## 2023-04-21 ENCOUNTER — Encounter: Payer: Self-pay | Admitting: *Deleted

## 2023-04-24 ENCOUNTER — Other Ambulatory Visit: Payer: Self-pay | Admitting: Family Medicine

## 2023-04-24 ENCOUNTER — Other Ambulatory Visit: Payer: Self-pay | Admitting: Internal Medicine

## 2023-04-25 ENCOUNTER — Other Ambulatory Visit: Payer: Self-pay | Admitting: Radiology

## 2023-04-25 DIAGNOSIS — M9902 Segmental and somatic dysfunction of thoracic region: Secondary | ICD-10-CM | POA: Diagnosis not present

## 2023-04-25 DIAGNOSIS — E119 Type 2 diabetes mellitus without complications: Secondary | ICD-10-CM

## 2023-04-25 DIAGNOSIS — S233XXA Sprain of ligaments of thoracic spine, initial encounter: Secondary | ICD-10-CM | POA: Diagnosis not present

## 2023-04-26 ENCOUNTER — Encounter: Payer: Self-pay | Admitting: Family Medicine

## 2023-04-26 LAB — MICROALBUMIN / CREATININE URINE RATIO
Creatinine,U: 70.3 mg/dL
Microalb Creat Ratio: 25.4 mg/g (ref 0.0–30.0)
Microalb, Ur: 1.8 mg/dL (ref 0.0–1.9)

## 2023-05-03 DIAGNOSIS — E119 Type 2 diabetes mellitus without complications: Secondary | ICD-10-CM

## 2023-05-09 DIAGNOSIS — M9902 Segmental and somatic dysfunction of thoracic region: Secondary | ICD-10-CM | POA: Diagnosis not present

## 2023-05-09 DIAGNOSIS — S233XXA Sprain of ligaments of thoracic spine, initial encounter: Secondary | ICD-10-CM | POA: Diagnosis not present

## 2023-05-24 NOTE — Telephone Encounter (Signed)
 Pt notified of error and f/u urine test scheduled for June as PCP requested

## 2023-05-24 NOTE — Telephone Encounter (Signed)
 With re calculation of urine protein from early march- it was higher than we thought (diabetic urine protein test)  I would like to verify/ re check this in early June (3 months after) to see if still high  Please schedule that lab/I put order in   Continue current medicines

## 2023-06-09 ENCOUNTER — Ambulatory Visit: Payer: Medicare PPO | Admitting: Internal Medicine

## 2023-06-24 ENCOUNTER — Other Ambulatory Visit: Payer: Self-pay | Admitting: Family Medicine

## 2023-06-25 ENCOUNTER — Other Ambulatory Visit: Payer: Self-pay | Admitting: Internal Medicine

## 2023-06-27 DIAGNOSIS — E119 Type 2 diabetes mellitus without complications: Secondary | ICD-10-CM | POA: Diagnosis not present

## 2023-06-27 DIAGNOSIS — H40013 Open angle with borderline findings, low risk, bilateral: Secondary | ICD-10-CM | POA: Diagnosis not present

## 2023-06-27 LAB — HM DIABETES EYE EXAM

## 2023-06-27 NOTE — Telephone Encounter (Signed)
 Patient last OV with Dr. Linder Revere 06/03/2022.  Per chart note:   Return in about 1 year (around 06/03/2023). Refill scripts sent for Breztri  and tessalon      Will okay one refill Breztri .  Patient has appointment with Canary Ceo, NP on 07/27/2023 for yearly OV.

## 2023-07-05 ENCOUNTER — Telehealth: Payer: Self-pay | Admitting: *Deleted

## 2023-07-05 MED ORDER — CHLORTHALIDONE 25 MG PO TABS: 12.5000 mg | ORAL_TABLET | Freq: Every day | ORAL | 2 refills | Status: DC

## 2023-07-05 NOTE — Telephone Encounter (Signed)
Pt notified of Dr. Marliss Coots comments and that Rx was sent to pharmacy

## 2023-07-05 NOTE — Telephone Encounter (Signed)
 I sent it  Will have to split the 25 No 12.5 pill that I know of  thanks

## 2023-07-05 NOTE — Telephone Encounter (Signed)
 Called pt she said her and Dr. Malissa Se discussed Dr. Malissa Se taking over some of her BP meds so she doesn't have to keep f/u with cardiology.  Pt is asking for PCP to refill/take over her chlorthalidone  Rx. Pt said the Rx is for 25 mg and she takes 1/2 tab. Pt wasn't sure if there is a 12.5 mg dose or if PCP has to send in the 25 mg with the dose still 1/2 tab daily. Pt said whatever PCP sends in she will take but just wanted to ask.  [facility]. CPE was on 04/20/23

## 2023-07-05 NOTE — Telephone Encounter (Signed)
 Copied from CRM [id number]. Topic: General - Other >> Jul 05, 2023 10:10 AM Orien Bird wrote: Reason for CRM: Patient is wanting Dr. Malissa Se nurse to give her a call back. Call back number [phone]

## 2023-07-15 ENCOUNTER — Other Ambulatory Visit: Payer: Self-pay | Admitting: Family Medicine

## 2023-07-15 DIAGNOSIS — E039 Hypothyroidism, unspecified: Secondary | ICD-10-CM

## 2023-07-16 ENCOUNTER — Other Ambulatory Visit: Payer: Self-pay | Admitting: Gastroenterology

## 2023-07-20 ENCOUNTER — Other Ambulatory Visit: Payer: Self-pay | Admitting: Gastroenterology

## 2023-07-25 ENCOUNTER — Encounter: Payer: Self-pay | Admitting: Internal Medicine

## 2023-07-26 ENCOUNTER — Telehealth: Payer: Self-pay | Admitting: *Deleted

## 2023-07-26 NOTE — Telephone Encounter (Signed)
 Pt will need an ppt with an available provider. We do not due drop off urinalysis. Please schedule an appt

## 2023-07-26 NOTE — Telephone Encounter (Signed)
 Copied from CRM [id number]. Topic: Clinical - Request for Lab/Test Order >> Jul 26, 2023 10:15 AM Clyde Darling P wrote: Reason for CRM: Pt would like Urinalysis cup to pick up from office today, pt can be reached at [phone]

## 2023-07-26 NOTE — Telephone Encounter (Signed)
 Spoke to pt, pt states she has labs tomorrow to recheck protein in urine. Pt states she wanted to bring urine to visit. Spoke to [place], she stated it was fine for pt to pick up cup. Relayed message to pt, advised her we close @ 5pm.

## 2023-07-27 ENCOUNTER — Ambulatory Visit: Admitting: Nurse Practitioner

## 2023-07-27 ENCOUNTER — Ambulatory Visit: Payer: Self-pay | Admitting: Family Medicine

## 2023-07-27 ENCOUNTER — Encounter: Payer: Self-pay | Admitting: Nurse Practitioner

## 2023-07-27 ENCOUNTER — Other Ambulatory Visit

## 2023-07-27 ENCOUNTER — Other Ambulatory Visit (INDEPENDENT_AMBULATORY_CARE_PROVIDER_SITE_OTHER): Payer: Self-pay | Admitting: Radiology

## 2023-07-27 VITALS — BP 120/80 | HR 89 | Ht 67.0 in | Wt 164.0 lb

## 2023-07-27 DIAGNOSIS — J4540 Moderate persistent asthma, uncomplicated: Secondary | ICD-10-CM | POA: Diagnosis not present

## 2023-07-27 DIAGNOSIS — J309 Allergic rhinitis, unspecified: Secondary | ICD-10-CM | POA: Diagnosis not present

## 2023-07-27 DIAGNOSIS — E119 Type 2 diabetes mellitus without complications: Secondary | ICD-10-CM | POA: Diagnosis not present

## 2023-07-27 LAB — MICROALBUMIN / CREATININE URINE RATIO
Creatinine,U: 88.2 mg/dL
Microalb Creat Ratio: 19.3 mg/g (ref 0.0–30.0)
Microalb, Ur: 1.7 mg/dL (ref 0.0–1.9)

## 2023-07-27 NOTE — Assessment & Plan Note (Signed)
 Stable. Utilizes chlortab OTC. Advised she could trial xyzal  during allergy  season and assess response.

## 2023-07-27 NOTE — Patient Instructions (Addendum)
 Continue Albuterol  inhaler 2 puffs every 6 hours as needed for shortness of breath or wheezing. Notify if symptoms persist despite rescue inhaler/neb use. Continue Breztri  2 puffs Twice daily. Brush tongue and rinse mouth afterwards Continue Singulair  10 mg At bedtime  Continue nexium  1 capsule before breakfast Continue famotidine  1 tablet At bedtime  Continue Benzonatate  1 capsule Three times a day as needed for cough   You can try xyzal  for allergies if you want to change next allergy  season  Follow up in 1 year with Dr. Linder Revere. If symptoms worsen, please contact office for sooner follow up or seek emergency care.
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
## 2023-07-27 NOTE — Assessment & Plan Note (Signed)
 Allergic phenotype. Stable on current regimen. No recent exacerbations. Action plan in place.  Patient Instructions  Continue Albuterol  inhaler 2 puffs every 6 hours as needed for shortness of breath or wheezing. Notify if symptoms persist despite rescue inhaler/neb use. Continue Breztri  2 puffs Twice daily. Brush tongue and rinse mouth afterwards Continue Singulair  10 mg At bedtime  Continue nexium  1 capsule before breakfast Continue famotidine  1 tablet At bedtime  Continue Benzonatate  1 capsule Three times a day as needed for cough   You can try xyzal  for allergies if you want to change next allergy  season  Follow up in 1 year with Dr. Linder Revere. If symptoms worsen, please contact office for sooner follow up or seek emergency care.

## 2023-08-02 ENCOUNTER — Other Ambulatory Visit: Payer: Self-pay | Admitting: Physician Assistant

## 2023-08-11 DIAGNOSIS — J45909 Unspecified asthma, uncomplicated: Secondary | ICD-10-CM | POA: Diagnosis not present

## 2023-08-11 DIAGNOSIS — R053 Chronic cough: Secondary | ICD-10-CM | POA: Diagnosis not present

## 2023-08-11 DIAGNOSIS — R197 Diarrhea, unspecified: Secondary | ICD-10-CM | POA: Diagnosis not present

## 2023-08-11 DIAGNOSIS — R350 Frequency of micturition: Secondary | ICD-10-CM | POA: Diagnosis not present

## 2023-08-11 DIAGNOSIS — R0989 Other specified symptoms and signs involving the circulatory and respiratory systems: Secondary | ICD-10-CM | POA: Diagnosis not present

## 2023-08-16 ENCOUNTER — Ambulatory Visit: Admitting: Family Medicine

## 2023-08-16 ENCOUNTER — Encounter: Payer: Self-pay | Admitting: Family Medicine

## 2023-08-16 VITALS — BP 128/70 | HR 82 | Temp 98.0°F | Ht 67.0 in | Wt 157.2 lb

## 2023-08-16 DIAGNOSIS — R051 Acute cough: Secondary | ICD-10-CM | POA: Diagnosis not present

## 2023-08-16 DIAGNOSIS — U071 COVID-19: Secondary | ICD-10-CM | POA: Insufficient documentation

## 2023-08-16 LAB — POC COVID19 BINAXNOW: SARS Coronavirus 2 Ag: POSITIVE — AB

## 2023-08-16 NOTE — Patient Instructions (Addendum)
 Hydrate best you can -sip steadily through the day  Water and other things  Soup is a good idea also   Keep your diet bland when you start to eat   If nausea worsens or you vomit let us  know If diarrhea worsens let us  know  If any abdominal pain let us  know   If any severe symptoms go to the ER   Get lots of rest   Exam is reassuring   Isolate until you feel better- the best you can  When you return to public-wear a mask for 10 more days   Update if not starting to improve in a week or if worsening

## 2023-08-16 NOTE — Progress Notes (Signed)
 Subjective:    Patient ID: Jordan Patterson, female    DOB: February 05, 1941, 83 y.o.   MRN: [id number]  HPI  Wt Readings from Last 3 Encounters:  08/16/23 157 lb 4 oz (71.3 kg)  07/27/23 164 lb (74.4 kg)  04/20/23 157 lb 2 oz (71.3 kg)   24.63 kg/m  Vitals:   08/16/23 1458  BP: 128/70  Pulse: 82  Temp: 98 F (36.7 C)  SpO2: 97%    Pt presents with c/o  Cough Congestion  Headache  Fatigue/lethargy  N/v/d Fatigue All since june 18th (was traveling and went to [facility]) - vitals were fine - recommended bland diet and pedialyte   Today Very weak  Tired  Cough - (improved) - mucous clear  Shortness of breath -none Not too much wheezing  Throat is not sore  No headache   Drinking some fluids/not a lot  Nausea is better Not much appetite Baseline-no taste or smell/so no change in that    The GI symptoms are improving now  Had yellow frequent loose stool and dry heaves     Covid test today is positive   Over the counter Advil  -family gave to her    Pt has history of asthma and upper airway cough syndrome in past  Albuterol  mdi -using frequently  Breztri  bid Singulair  10 mg daily  Benzonatate  for cough in past Also nexium  and famotidine  for gerd / LPR  Sees Dr Neysa    Results for orders placed or performed in visit on 08/16/23  POC COVID-19   Collection Time: 08/16/23  3:10 PM  Result Value Ref Range   SARS Coronavirus 2 Ag Positive (A) Negative    Patient Active Problem List   Diagnosis Date Noted   COVID-19 08/16/2023   Routine general medical examination at a health care facility 04/20/2023   Hearing loss secondary to cerumen impaction, bilateral 10/27/2022   Preoperative respiratory examination 04/05/2022   Frequent urination 02/09/2022   Constipation 12/14/2021   Preoperative cardiovascular examination 11/09/2021   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   Estrogen deficiency 04/07/2021   Thoracic compression fracture (HCC) 01/22/2021   Current use of proton  pump inhibitor 03/11/2020   Chronic midline low back pain without sciatica 03/12/2019   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   Hyponatremia 12/14/2018   Laryngopharyngeal reflux (LPR) 08/24/2017   Osteoporosis 08/24/2017   Leg cramping 08/21/2017   Regurgitation of food    Prolapse of female pelvic organs 02/19/2017   History of compression fracture of spine 07/18/2014   Hypertensive retinopathy of both eyes 06/04/2014   Nonexudative age-related macular degeneration 06/04/2014   Posterior vitreous detachment of both eyes 06/04/2014   Pseudophakia of both eyes 06/04/2014   Right knee pain 05/30/2014   Advanced care planning/counseling discussion 01/24/2014   Health maintenance examination 01/24/2014   Dysgeusia 11/12/2013   Upper airway cough syndrome 03/19/2013   Allergic rhinitis 05/15/2012   Asthma, moderate persistent 03/09/2012   Medicare annual wellness visit, subsequent 01/14/2012   Hiatal hernia    GERD (gastroesophageal reflux disease)    Essential hypertension    Hyperlipidemia associated with type 2 diabetes mellitus (HCC)    Depression    Hypothyroidism    Diabetes type 2, controlled (HCC)    Osteoarthritis    Past Medical History:  Diagnosis Date   Allergic rhinitis    Arthritis    hands and knees   Chronic constipation    Environmental and seasonal allergies    GERD (gastroesophageal reflux disease)    (  [facility]) EGD - mild esophageal dysmotility and small hiatal hernia   Hiatal hernia    History of esophageal stricture 2013   s/p dilatation   History of vertebral compression fracture 07/18/2014   S/p kyphoplasty T12, T7 and T8   Hyperlipidemia    mild, diet controlled   Hypertension    Hypothyroidism    Macular degeneration of both eyes    Migraines    Moderate persistent asthma without complication    pulmonologist--- dr c. young   Osteoporosis 07/24/2014   w/  hx compression fx  T12,  T7 and T8 s/p kyphoplasty   Prolapse of vaginal walls    anterior  and posterior   Type 2 diabetes mellitus (HCC)    followed by pcp   (04-15-2022  pt stated she check blood sugar daily in am fasting,  average 106--117)   Uterovaginal prolapse, incomplete    Wears glasses    Past Surgical History:  Procedure Laterality Date   97 HOUR PH STUDY N/A 04/11/2017   Procedure: 24 HOUR PH STUDY;  Surgeon: Shila Gustav GAILS, MD;  Location: [facility];  Service: Endoscopy;  Laterality: N/A;   CATARACT EXTRACTION W/ INTRAOCULAR LENS IMPLANT Bilateral 2003   COLONOSCOPY  2017   COLPOCLEISIS N/A 04/19/2022   Procedure: Ladora Alexanders COLPOCLEISIS;  Surgeon: Marilynne Rosaline SAILOR, MD;  Location: [facility];  Service: Gynecology;  Laterality: N/A;   CYSTOSCOPY N/A 04/19/2022   Procedure: CYSTOSCOPY;  Surgeon: Marilynne Rosaline SAILOR, MD;  Location: [facility];  Service: Gynecology;  Laterality: N/A;   DILATION AND CURETTAGE OF UTERUS  1978   ESOPHAGEAL MANOMETRY N/A 04/11/2017   Procedure: ESOPHAGEAL MANOMETRY (EM);  Surgeon: Shila Gustav GAILS, MD;  Location: [facility];  Service: Endoscopy;  Laterality: N/A;   ESOPHAGOGASTRODUODENOSCOPY  06/25/2011   Procedure: ESOPHAGOGASTRODUODENOSCOPY (EGD);  Surgeon: Princella CHRISTELLA Obie, MD;  Location: [facility];  Service: Endoscopy;  Laterality: N/A;  no Xray   KYPHOPLASTY N/A 08/30/2014   Procedure: THORACIC TWELVE KYPHOPLASTY;  Surgeon: Lamar Peaches, MD   KYPHOPLASTY N/A 01/22/2021   Procedure: Thoracic eight, Thoracic nine KYPHOPLASTY;  Surgeon: Gillie Duncans, MD;  Location: [facility];  Service: Neurosurgery;  Laterality: N/A;   LAPAROSCOPY ABDOMEN DIAGNOSTIC     yrs ago;    To R/O endometriosis   PH IMPEDANCE STUDY N/A 04/11/2017   Procedure: PH IMPEDANCE STUDY;  Surgeon: Shila Gustav GAILS, MD;  Location: [facility];  Service: Endoscopy;  Laterality: N/A;   RECTOCELE REPAIR N/A 04/19/2022   Procedure: Levator plication with perineorrhaphy;  Surgeon: Marilynne Rosaline SAILOR, MD;  Location: [facility];  Service: Gynecology;  Laterality: N/A;   SAVORY DILATION  06/25/2011   Procedure: SAVORY DILATION;  Surgeon: Princella CHRISTELLA Obie, MD;  Location: [facility];  Service: Endoscopy;  Laterality: N/A;   TONSILLECTOMY AND ADENOIDECTOMY  1962   Social History   Tobacco Use   Smoking status: Never    Passive exposure: Never   Smokeless tobacco: Never  Vaping Use   Vaping status: Never Used  Substance Use Topics   Alcohol  use: No    Alcohol /week: 0.0 standard drinks of alcohol    Drug use: Never   Family History  Problem Relation Age of Onset   Mitral valve prolapse Father    Diabetes Father    Mitral valve prolapse Mother    Hypertension Mother    Stroke Mother 92       after valve surgery   Parkinson's disease Brother  Other Brother        POST WAR TRAUMA   Stroke Paternal Grandfather    Heart failure Maternal Grandfather    Cancer Neg Hx    Colon cancer Neg Hx    Stomach cancer Neg Hx    Esophageal cancer Neg Hx    Pancreatic cancer Neg Hx    Liver disease Neg Hx    Allergies  Allergen Reactions   Ace Inhibitors Cough   Crestor  [Rosuvastatin ] Other (See Comments)    myalgias   Wellbutrin [Bupropion Hcl] Other (See Comments)    Unknown reaction   Cymbalta [Duloxetine Hcl] Palpitations and Other (See Comments)    headaches   Lipitor [Atorvastatin ] Cough   Pregabalin Other (See Comments)    Unknown reaction   Tegretol [Carbamazepine] Other (See Comments)    Dizziness, headache   Current Outpatient Medications on File Prior to Visit  Medication Sig Dispense Refill   Accu-Chek Softclix Lancets lancets Use to check blood sugar daily (dx. E11.9) 100 each 1   acetaminophen  (TYLENOL ) 500 MG tablet Take 1 tablet (500 mg total) by mouth every 6 (six) hours as needed (pain). 30 tablet 0   albuterol  (VENTOLIN  HFA) 108 (90 Base) MCG/ACT inhaler TAKE 2 PUFFS BY MOUTH EVERY 6 HOURS AS NEEDED FOR WHEEZE OR SHORTNESS OF BREATH (Patient taking differently: Inhale 2  puffs into the lungs every 6 (six) hours as needed for wheezing or shortness of breath. TAKE 2 PUFFS BY MOUTH EVERY 6 HOURS AS NEEDED FOR WHEEZE OR SHORTNESS OF BREATH) 18 each 2   amLODipine  (NORVASC ) 10 MG tablet TAKE 1 TABLET BY MOUTH EVERY DAY 90 tablet 2   benzonatate  (TESSALON ) 200 MG capsule Take 1 capsule (200 mg total) by mouth 3 (three) times daily as needed for cough. 180 capsule 3   blood glucose meter kit and supplies Dispense based on patient and insurance preference. Use qd and prn for uncontrolled diabetes. (FOR ICD-10 E10.9, E11.9). 1 each 1   budeson-glycopyrrolate -formoterol  (BREZTRI  AEROSPHERE) 160-9-4.8 MCG/ACT AERO inhaler Inhale 2 puffs into the lungs in the morning and at bedtime. Rinse mouth after each use. 3 each 0   Calcium  250 MG CAPS Take 500 mg by mouth at bedtime.     chlorpheniramine (CHLOR-TRIMETON) 4 MG tablet Take 4 mg by mouth in the morning, at noon, and at bedtime.     chlorthalidone  (HYGROTON ) 25 MG tablet Take 0.5 tablets (12.5 mg total) by mouth daily. 45 tablet 2   esomeprazole  (NEXIUM ) 40 MG capsule 1 CAPSULE BY MOUTH EVERY DAY BEFORE BREAKFAST 90 capsule 6   famotidine  (PEPCID ) 20 MG tablet TAKE 1 TABLET BY MOUTH EVERYDAY AT BEDTIME 90 tablet 3   ferrous sulfate  325 (65 FE) MG EC tablet Take 325 mg by mouth every Monday, Wednesday, and Friday. Takes in am     Garlic (GARLIQUE PO) Take 1 tablet by mouth daily.     glucose blood test strip Use as instructed 100 each 12   levothyroxine  (SYNTHROID ) 100 MCG tablet TAKE 1 TABLET BY MOUTH EVERY DAY BEFORE BREAKFAST 90 tablet 2   MAGNESIUM  LACTATE PO Take 2 tablets by mouth at bedtime. 210 mg each     metFORMIN  (GLUCOPHAGE ) 1000 MG tablet TAKE 1 TABLET (1,000 MG TOTAL) BY MOUTH TWICE A DAY WITH FOOD 180 tablet 2   montelukast  (SINGULAIR ) 10 MG tablet TAKE 1 TABLET BY MOUTH EVERYDAY AT BEDTIME 90 tablet 4   Multiple Vitamins-Minerals (EYE HEALTH) CAPS Take 1 capsule by mouth daily.  Nutritional Supplements  (GLUCOSE MANAGEMENT PO) Take by mouth.     nystatin-triamcinolone  (MYCOLOG II) cream Apply 1 Application topically as needed.     Omega-3 Fatty Acids (OMEGA 3 500 PO) Take 1 capsule by mouth in the morning and at bedtime.     Polyvinyl Alcohol -Povidone (REFRESH OP) Place 1 drop into both eyes daily.      valsartan  (DIOVAN ) 320 MG tablet TAKE 1 TABLET BY MOUTH EVERY DAY 90 tablet 2   Vitamin D -Vitamin K (VITAMIN K2-VITAMIN D3 PO) Take 1 capsule by mouth daily with lunch. D3 2000iu/  K2500     vitamin E 200 UNIT capsule Take 400 Units by mouth daily with lunch.     No current facility-administered medications on file prior to visit.    Review of Systems  Constitutional:  Positive for appetite change and fatigue. Negative for fever.  HENT:  Positive for congestion, postnasal drip and rhinorrhea. Negative for ear pain, sinus pressure, sneezing and sore throat.   Eyes:  Negative for pain and discharge.  Respiratory:  Positive for cough. Negative for shortness of breath, wheezing and stridor.   Cardiovascular:  Negative for chest pain.  Gastrointestinal:  Positive for diarrhea. Negative for abdominal distention, abdominal pain, nausea and vomiting.       Nausea and vomiting are better  Still some loose stool   Genitourinary:  Negative for frequency, hematuria and urgency.  Musculoskeletal:  Negative for arthralgias and myalgias.  Skin:  Negative for rash.  Neurological:  Positive for headaches. Negative for dizziness, weakness and light-headedness.  Psychiatric/Behavioral:  Negative for confusion and dysphoric mood.        Objective:   Physical Exam Constitutional:      General: She is not in acute distress.    Appearance: Normal appearance. She is well-developed and normal weight. She is not ill-appearing, toxic-appearing or diaphoretic.  HENT:     Head: Normocephalic and atraumatic.     Comments: Nares are injected and congested      Right Ear: Tympanic membrane, ear canal and  external ear normal.     Left Ear: Tympanic membrane, ear canal and external ear normal.     Nose: Congestion and rhinorrhea present.     Mouth/Throat:     Mouth: Mucous membranes are moist.     Pharynx: Oropharynx is clear. No oropharyngeal exudate or posterior oropharyngeal erythema.     Comments: Clear pnd   Eyes:     General:        Right eye: No discharge.        Left eye: No discharge.     Conjunctiva/sclera: Conjunctivae normal.     Pupils: Pupils are equal, round, and reactive to light.    Cardiovascular:     Rate and Rhythm: Normal rate.     Heart sounds: Normal heart sounds.  Pulmonary:     Effort: Pulmonary effort is normal. No respiratory distress.     Breath sounds: Normal breath sounds. No stridor. No wheezing, rhonchi or rales.     Comments: Harsh bs No wheeze No rales or rhonchi   Good air exch Chest:     Chest wall: No tenderness.  Abdominal:     General: There is no distension.     Palpations: There is no mass.     Tenderness: There is no abdominal tenderness. There is no guarding or rebound.   Musculoskeletal:     Cervical back: Normal range of motion and neck supple.  Lymphadenopathy:  Cervical: No cervical adenopathy.   Skin:    General: Skin is warm and dry.     Capillary Refill: Capillary refill takes less than 2 seconds.     Findings: No rash.   Neurological:     Mental Status: She is alert.     Cranial Nerves: No cranial nerve deficit.   Psychiatric:        Mood and Affect: Mood normal.           Assessment & Plan:   Problem List Items Addressed This Visit       Other   COVID-19 - Primary   Symptoms for over a week (was out of town)  Started with GI , then [place]  Now more uri  Overall improving Reassuring exam and vitals and pulse ox   Disc symptomatic care - see instructions on AVS  Will watch closely for wheezing or signs and symptoms of pna given asthma   Handouts given Discussed isolation/quarantine and masking  recommendations  Counseled to advance activity slowly as it will take a while to get energy /endurance back         Other Visit Diagnoses       Acute cough       Relevant Orders   POC COVID-19 (Completed)

## 2023-08-16 NOTE — Assessment & Plan Note (Addendum)
 Symptoms for over a week (was out of town)  Started with GI , then [place]  Now more uri  Overall improving Reassuring exam and vitals and pulse ox   Disc symptomatic care - see instructions on AVS  Will watch closely for wheezing or signs and symptoms of pna given asthma   Handouts given Discussed isolation/quarantine and masking recommendations  Counseled to advance activity slowly as it will take a while to get energy /endurance back

## 2023-08-22 ENCOUNTER — Ambulatory Visit: Payer: Self-pay

## 2023-08-22 NOTE — Telephone Encounter (Signed)
 FYI Only or Action Required?: FYI only for provider.  Patient was last seen in primary care on 08/16/2023 by Randeen Laine LABOR, MD. Called Nurse Triage reporting Diarrhea. Symptoms began today. Interventions attempted: Rest, hydration, or home remedies. Symptoms are: gradually improving.  Triage Disposition: Home Care  Patient/caregiver understands and will follow disposition?: Yes    Message from [facility] C sent at 08/22/2023 12:10 PM EDT   Reason for Triage: Patient experiencing diarrhea- three times today. Patient saw Dr. Randeen last week and was told if symptoms aren't improving to give a call back.         Reason for Disposition  MILD-MODERATE diarrhea (e.g., 1-6 times / day more than normal)  Answer Assessment - Initial Assessment Questions 1. DIARRHEA SEVERITY: How bad is the diarrhea? How many more stools have you had in the past 24 hours than normal?    - NO DIARRHEA (SCALE 0)   - MILD (SCALE 1-3): Few loose or mushy BMs; increase of 1-3 stools over normal daily number of stools; mild increase in ostomy output.   -  MODERATE (SCALE 4-7): Increase of 4-6 stools daily over normal; moderate increase in ostomy output.   -  SEVERE (SCALE 8-10; OR WORST POSSIBLE): Increase of 7 or more stools daily over normal; moderate increase in ostomy output; incontinence.     Mild to moderate 2. ONSET: When did the diarrhea begin?  Today  3. BM CONSISTENCY: How loose or watery is the diarrhea?      Watery, loose 4. VOMITING: Are you also vomiting? If Yes, ask: How many times in the past 24 hours?      no 5. ABDOMEN PAIN: Are you having any abdomen pain? If Yes, ask: What does it feel like? (e.g., crampy, dull, intermittent, constant)      no 6. ABDOMEN PAIN SEVERITY: If present, ask: How bad is the pain?  (e.g., Scale 1-10; mild, moderate, or severe)   - MILD (1-3): doesn't interfere with normal activities, abdomen soft and not tender to touch    - MODERATE (4-7): interferes with  normal activities or awakens from sleep, abdomen tender to touch    - SEVERE (8-10): excruciating pain, doubled over, unable to do any normal activities       no 7. ORAL INTAKE: If vomiting, Have you been able to drink liquids? How much liquids have you had in the past 24 hours?     Drinking some 8. HYDRATION: Any signs of dehydration? (e.g., dry mouth [not just dry lips], too weak to stand, dizziness, new weight loss) When did you last urinate?     Weakness, dizziness and dry mouth 9. EXPOSURE: Have you traveled to a foreign country recently? Have you been exposed to anyone with diarrhea? Could you have eaten any food that was spoiled?     N/a 10. ANTIBIOTIC USE: Are you taking antibiotics now or have you taken antibiotics in the past 2 months?       no 11. OTHER SYMPTOMS: Do you have any other symptoms? (e.g., fever, blood in stool)       no 12. PREGNANCY: Is there any chance you are pregnant? When was your last menstrual period?       N/a  Sometimes pt states could go 1 or 2 days before diarrhea starts back.  Pt stated she had COVID last week & is not sure if that is the cause of diarrhea. Encouraged pt to drink fluids  Protocols used: [facility]

## 2023-08-22 NOTE — Telephone Encounter (Signed)
 Message from [facility] C sent at 08/22/2023 12:10 PM EDT  Reason for Triage: Patient experiencing diarrhea- three times today. Patient saw Dr. Randeen last week and was told if symptoms aren't improving to give a call back.    Nurse called pt at number listed on chart: phone rang with no voicemail picking up therefore unable to leave voicemail.

## 2023-09-27 ENCOUNTER — Other Ambulatory Visit: Payer: Self-pay | Admitting: Internal Medicine

## 2023-09-29 ENCOUNTER — Encounter: Payer: Self-pay | Admitting: Family Medicine

## 2023-09-29 NOTE — Telephone Encounter (Signed)
 Please call her to schedule in office appointment

## 2023-10-05 ENCOUNTER — Encounter: Payer: Self-pay | Admitting: Family Medicine

## 2023-10-05 ENCOUNTER — Ambulatory Visit: Payer: Self-pay | Admitting: Family Medicine

## 2023-10-05 ENCOUNTER — Ambulatory Visit: Admitting: Family Medicine

## 2023-10-05 VITALS — BP 134/76 | HR 93 | Temp 98.2°F | Ht 66.0 in | Wt 158.4 lb

## 2023-10-05 DIAGNOSIS — E119 Type 2 diabetes mellitus without complications: Secondary | ICD-10-CM

## 2023-10-05 DIAGNOSIS — I8312 Varicose veins of left lower extremity with inflammation: Secondary | ICD-10-CM

## 2023-10-05 DIAGNOSIS — Z8639 Personal history of other endocrine, nutritional and metabolic disease: Secondary | ICD-10-CM | POA: Diagnosis not present

## 2023-10-05 DIAGNOSIS — I10 Essential (primary) hypertension: Secondary | ICD-10-CM

## 2023-10-05 DIAGNOSIS — E039 Hypothyroidism, unspecified: Secondary | ICD-10-CM | POA: Diagnosis not present

## 2023-10-05 DIAGNOSIS — Z7984 Long term (current) use of oral hypoglycemic drugs: Secondary | ICD-10-CM

## 2023-10-05 DIAGNOSIS — I8311 Varicose veins of right lower extremity with inflammation: Secondary | ICD-10-CM

## 2023-10-05 DIAGNOSIS — R202 Paresthesia of skin: Secondary | ICD-10-CM | POA: Diagnosis not present

## 2023-10-05 DIAGNOSIS — I8393 Asymptomatic varicose veins of bilateral lower extremities: Secondary | ICD-10-CM | POA: Insufficient documentation

## 2023-10-05 LAB — CBC WITH DIFFERENTIAL/PLATELET
Basophils Absolute: 0 K/uL (ref 0.0–0.1)
Basophils Relative: 0.5 % (ref 0.0–3.0)
Eosinophils Absolute: 0.1 K/uL (ref 0.0–0.7)
Eosinophils Relative: 1.7 % (ref 0.0–5.0)
HCT: 38.7 % (ref 36.0–46.0)
Hemoglobin: 12.8 g/dL (ref 12.0–15.0)
Lymphocytes Relative: 34.3 % (ref 12.0–46.0)
Lymphs Abs: 1.8 K/uL (ref 0.7–4.0)
MCHC: 33.1 g/dL (ref 30.0–36.0)
MCV: 91.6 fl (ref 78.0–100.0)
Monocytes Absolute: 0.4 K/uL (ref 0.1–1.0)
Monocytes Relative: 7.5 % (ref 3.0–12.0)
Neutro Abs: 3 K/uL (ref 1.4–7.7)
Neutrophils Relative %: 56 % (ref 43.0–77.0)
Platelets: 198 K/uL (ref 150.0–400.0)
RBC: 4.22 Mil/uL (ref 3.87–5.11)
RDW: 13.2 % (ref 11.5–15.5)
WBC: 5.3 K/uL (ref 4.0–10.5)

## 2023-10-05 LAB — TSH: TSH: 2.13 u[IU]/mL (ref 0.35–5.50)

## 2023-10-05 LAB — COMPREHENSIVE METABOLIC PANEL WITH GFR
ALT: 22 U/L (ref 0–35)
AST: 20 U/L (ref 0–37)
Albumin: 4.4 g/dL (ref 3.5–5.2)
Alkaline Phosphatase: 43 U/L (ref 39–117)
BUN: 18 mg/dL (ref 6–23)
CO2: 30 meq/L (ref 19–32)
Calcium: 9.3 mg/dL (ref 8.4–10.5)
Chloride: 96 meq/L (ref 96–112)
Creatinine, Ser: 0.76 mg/dL (ref 0.40–1.20)
GFR: 72.69 mL/min (ref >60.00)
Glucose, Bld: 129 mg/dL — ABNORMAL HIGH (ref 70–99)
Potassium: 3.8 meq/L (ref 3.5–5.1)
Sodium: 136 meq/L (ref 135–145)
Total Bilirubin: 0.4 mg/dL (ref 0.2–1.2)
Total Protein: 6.9 g/dL (ref 6.0–8.3)

## 2023-10-05 LAB — IRON: Iron: 77 ug/dL (ref 42–145)

## 2023-10-05 LAB — HEMOGLOBIN A1C: Hgb A1c MFr Bld: 6.8 % — ABNORMAL HIGH (ref 4.6–6.5)

## 2023-10-05 LAB — VITAMIN B12: Vitamin B-12: 367 pg/mL (ref 211–911)

## 2023-10-05 LAB — FERRITIN: Ferritin: 10.2 ng/mL (ref 10.0–291.0)

## 2023-10-05 NOTE — Assessment & Plan Note (Signed)
 Has been well controlled Some leg paresthesia- atypical for dm neuropathy  A1c today Continues metformin  1000 mg bid

## 2023-10-05 NOTE — Patient Instructions (Signed)
 Labs today for chronic medical problems and paresthesia of legs   Try some support hose (to the waist)  Just to see if it helps

## 2023-10-05 NOTE — Progress Notes (Signed)
 Subjective:    Patient ID: Jordan Patterson, female    DOB: 05/01/1940, 83 y.o.   MRN: [id number]  HPI  Wt Readings from Last 3 Encounters:  10/05/23 158 lb 6 oz (71.8 kg)  08/16/23 157 lb 4 oz (71.3 kg)  07/27/23 164 lb (74.4 kg)   25.56 kg/m  Vitals:   10/05/23 1030  BP: 134/76  Pulse: 93  Temp: 98.2 F (36.8 C)  SpO2: 97%   Pt presents for c/o Foot pain  Leg pain    Symptoms almost 2 years   When she puts feet on floor in am - starts in back of knees  Moves up and down legs Tingling /numb ish feeling  (discomfort but not pain)  Seldom in arms   Not worse when still  Not worse at night  Not worse in recliner   No focal weakness   Worse after inactivity  Little better once she gets moving  Less active outside this summer due to weather - [facility]   Occational cramps / not regular and not worse than in the past    In past Chiropractic adjustment helped short term     Lab Results  Component Value Date   TSH 2.72 04/20/2023   Lab Results  Component Value Date   WBC 4.9 04/20/2023   HGB 13.2 04/20/2023   HCT 39.7 04/20/2023   MCV 92.7 04/20/2023   PLT 203.0 04/20/2023   Lab Results  Component Value Date   VITAMINB12 309 04/20/2023   DM has been well controlled  Lab Results  Component Value Date   HGBA1C 6.5 04/20/2023   HGBA1C 6.6 (H) 08/25/2022   HGBA1C 6.6 (H) 03/24/2022   Lab Results  Component Value Date   FERRITIN 5.1 (L) 09/24/2019     Patient Active Problem List   Diagnosis Date Noted   Varicose veins of both lower extremities 10/05/2023   Paresthesia of bilateral legs 10/05/2023   History of iron deficiency 10/05/2023   COVID-19 08/16/2023   Routine general medical examination at a health care facility 04/20/2023   Hearing loss secondary to cerumen impaction, bilateral 10/27/2022   Preoperative respiratory examination 04/05/2022   Frequent urination 02/09/2022   Constipation 12/14/2021   Preoperative  cardiovascular examination 11/09/2021   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   Estrogen deficiency 04/07/2021   Thoracic compression fracture (HCC) 01/22/2021   Current use of proton pump inhibitor 03/11/2020   Chronic midline low back pain without sciatica 03/12/2019   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   Hyponatremia 12/14/2018   Laryngopharyngeal reflux (LPR) 08/24/2017   Osteoporosis 08/24/2017   Leg cramping 08/21/2017   Regurgitation of food    Prolapse of female pelvic organs 02/19/2017   History of compression fracture of spine 07/18/2014   Hypertensive retinopathy of both eyes 06/04/2014   Nonexudative age-related macular degeneration 06/04/2014   Posterior vitreous detachment of both eyes 06/04/2014   Pseudophakia of both eyes 06/04/2014   Right knee pain 05/30/2014   Advanced care planning/counseling discussion 01/24/2014   Health maintenance examination 01/24/2014   Dysgeusia 11/12/2013   Upper airway cough syndrome 03/19/2013   Allergic rhinitis 05/15/2012   Asthma, moderate persistent 03/09/2012   Medicare annual wellness visit, subsequent 01/14/2012   Hiatal hernia    GERD (gastroesophageal reflux disease)    Essential hypertension    Hyperlipidemia associated with type 2 diabetes mellitus (HCC)    Depression    Hypothyroidism    Diabetes type 2, controlled (HCC)  Osteoarthritis    Past Medical History:  Diagnosis Date   Allergic rhinitis    Arthritis    hands and knees   Chronic constipation    Environmental and seasonal allergies    GERD (gastroesophageal reflux disease)    Priscilla) EGD - mild esophageal dysmotility and small hiatal hernia   Hiatal hernia    History of esophageal stricture 2013   s/p dilatation   History of vertebral compression fracture 07/18/2014   S/p kyphoplasty T12, T7 and T8   Hyperlipidemia    mild, diet controlled   Hypertension    Hypothyroidism    Macular degeneration of both eyes    Migraines    Moderate persistent asthma  without complication    pulmonologist--- dr c. young   Osteoporosis 07/24/2014   w/  hx compression fx  T12,  T7 and T8 s/p kyphoplasty   Prolapse of vaginal walls    anterior and posterior   Type 2 diabetes mellitus (HCC)    followed by pcp   (04-15-2022  pt stated she check blood sugar daily in am fasting,  average 106--117)   Uterovaginal prolapse, incomplete    Wears glasses    Past Surgical History:  Procedure Laterality Date   73 HOUR PH STUDY N/A 04/11/2017   Procedure: 24 HOUR PH STUDY;  Surgeon: Shila Gustav GAILS, MD;  Location: [facility];  Service: Endoscopy;  Laterality: N/A;   CATARACT EXTRACTION W/ INTRAOCULAR LENS IMPLANT Bilateral 2003   COLONOSCOPY  2017   COLPOCLEISIS N/A 04/19/2022   Procedure: Ladora Alexanders COLPOCLEISIS;  Surgeon: Marilynne Rosaline SAILOR, MD;  Location: [facility];  Service: Gynecology;  Laterality: N/A;   CYSTOSCOPY N/A 04/19/2022   Procedure: CYSTOSCOPY;  Surgeon: Marilynne Rosaline SAILOR, MD;  Location: [facility];  Service: Gynecology;  Laterality: N/A;   DILATION AND CURETTAGE OF UTERUS  1978   ESOPHAGEAL MANOMETRY N/A 04/11/2017   Procedure: ESOPHAGEAL MANOMETRY (EM);  Surgeon: Shila Gustav GAILS, MD;  Location: [facility];  Service: Endoscopy;  Laterality: N/A;   ESOPHAGOGASTRODUODENOSCOPY  06/25/2011   Procedure: ESOPHAGOGASTRODUODENOSCOPY (EGD);  Surgeon: Princella CHRISTELLA Nida, MD;  Location: [facility];  Service: Endoscopy;  Laterality: N/A;  no Xray   KYPHOPLASTY N/A 08/30/2014   Procedure: THORACIC TWELVE KYPHOPLASTY;  Surgeon: Lamar Peaches, MD   KYPHOPLASTY N/A 01/22/2021   Procedure: Thoracic eight, Thoracic nine KYPHOPLASTY;  Surgeon: Gillie Duncans, MD;  Location[facility];  Service: Neurosurgery;  Laterality: N/A;   LAPAROSCOPY ABDOMEN DIAGNOSTIC     yrs ago;    To R/O endometriosis   PH IMPEDANCE STUDY N/A 04/11/2017   Procedure: PH IMPEDANCE STUDY;  Surgeon: Shila Gustav GAILS, MD;  Location: [facility];   Service: Endoscopy;  Laterality: N/A;   RECTOCELE REPAIR N/A 04/19/2022   Procedure: Levator plication with perineorrhaphy;  Surgeon: Marilynne Rosaline SAILOR, MD;  Location: [facility];  Service: Gynecology;  Laterality: N/A;   SAVORY DILATION  06/25/2011   Procedure: SAVORY DILATION;  Surgeon: Princella CHRISTELLA Nida, MD;  Location: [facility];  Service: Endoscopy;  Laterality: N/A;   TONSILLECTOMY AND ADENOIDECTOMY  1962   Social History   Tobacco Use   Smoking status: Never    Passive exposure: Never   Smokeless tobacco: Never  Vaping Use   Vaping status: Never Used  Substance Use Topics   Alcohol  use: No    Alcohol /week: 0.0 standard drinks of alcohol    Drug use: Never   Family History  Problem Relation Age  of Onset   Mitral valve prolapse Father    Diabetes Father    Mitral valve prolapse Mother    Hypertension Mother    Stroke Mother 35       after valve surgery   Parkinson's disease Brother    Other Brother        POST WAR TRAUMA   Stroke Paternal Grandfather    Heart failure Maternal Grandfather    Cancer Neg Hx    Colon cancer Neg Hx    Stomach cancer Neg Hx    Esophageal cancer Neg Hx    Pancreatic cancer Neg Hx    Liver disease Neg Hx    Allergies  Allergen Reactions   Ace Inhibitors Cough   Crestor  [Rosuvastatin ] Other (See Comments)    myalgias   Wellbutrin [Bupropion Hcl] Other (See Comments)    Unknown reaction   Cymbalta [Duloxetine Hcl] Palpitations and Other (See Comments)    headaches   Lipitor [Atorvastatin ] Cough   Pregabalin Other (See Comments)    Unknown reaction   Tegretol [Carbamazepine] Other (See Comments)    Dizziness, headache   Current Outpatient Medications on File Prior to Visit  Medication Sig Dispense Refill   Accu-Chek Softclix Lancets lancets Use to check blood sugar daily (dx. E11.9) 100 each 1   acetaminophen  (TYLENOL ) 500 MG tablet Take 1 tablet (500 mg total) by mouth every 6 (six) hours as needed (pain). 30  tablet 0   albuterol  (VENTOLIN  HFA) 108 (90 Base) MCG/ACT inhaler TAKE 2 PUFFS BY MOUTH EVERY 6 HOURS AS NEEDED FOR WHEEZE OR SHORTNESS OF BREATH (Patient taking differently: Inhale 2 puffs into the lungs every 6 (six) hours as needed for wheezing or shortness of breath. TAKE 2 PUFFS BY MOUTH EVERY 6 HOURS AS NEEDED FOR WHEEZE OR SHORTNESS OF BREATH) 18 each 2   amLODipine  (NORVASC ) 10 MG tablet TAKE 1 TABLET BY MOUTH EVERY DAY 90 tablet 2   benzonatate  (TESSALON ) 200 MG capsule Take 1 capsule (200 mg total) by mouth 3 (three) times daily as needed for cough. 180 capsule 3   blood glucose meter kit and supplies Dispense based on patient and insurance preference. Use qd and prn for uncontrolled diabetes. (FOR ICD-10 E10.9, E11.9). 1 each 1   budesonide -glycopyrrolate -formoterol  (BREZTRI  AEROSPHERE) 160-9-4.8 MCG/ACT AERO inhaler INHALE 2 PUFFS INTO THE LUNGS IN THE MORNING AND AT BEDTIME. RINSE MOUTH AFTER EACH USE. 32.1 each 10   Calcium  250 MG CAPS Take 500 mg by mouth at bedtime.     chlorpheniramine (CHLOR-TRIMETON) 4 MG tablet Take 4 mg by mouth in the morning, at noon, and at bedtime.     chlorthalidone  (HYGROTON ) 25 MG tablet Take 0.5 tablets (12.5 mg total) by mouth daily. 45 tablet 2   esomeprazole  (NEXIUM ) 40 MG capsule 1 CAPSULE BY MOUTH EVERY DAY BEFORE BREAKFAST 90 capsule 6   famotidine  (PEPCID ) 20 MG tablet TAKE 1 TABLET BY MOUTH EVERYDAY AT BEDTIME 90 tablet 3   ferrous sulfate  325 (65 FE) MG EC tablet Take 325 mg by mouth every Monday, Wednesday, and Friday. Takes in am     Garlic (GARLIQUE PO) Take 1 tablet by mouth daily.     glucose blood test strip Use as instructed 100 each 12   levothyroxine  (SYNTHROID ) 100 MCG tablet TAKE 1 TABLET BY MOUTH EVERY DAY BEFORE BREAKFAST 90 tablet 2   MAGNESIUM  LACTATE PO Take 2 tablets by mouth at bedtime. 210 mg each     metFORMIN  (GLUCOPHAGE ) 1000 MG  tablet TAKE 1 TABLET (1,000 MG TOTAL) BY MOUTH TWICE A DAY WITH FOOD 180 tablet 2   montelukast   (SINGULAIR ) 10 MG tablet TAKE 1 TABLET BY MOUTH EVERYDAY AT BEDTIME 90 tablet 4   Multiple Vitamins-Minerals (EYE HEALTH) CAPS Take 1 capsule by mouth daily.     Nutritional Supplements (GLUCOSE MANAGEMENT PO) Take by mouth.     nystatin-triamcinolone  (MYCOLOG II) cream Apply 1 Application topically as needed.     Omega-3 Fatty Acids (OMEGA 3 500 PO) Take 1 capsule by mouth in the morning and at bedtime.     Polyvinyl Alcohol -Povidone (REFRESH OP) Place 1 drop into both eyes daily.      valsartan  (DIOVAN ) 320 MG tablet TAKE 1 TABLET BY MOUTH EVERY DAY 90 tablet 2   Vitamin D -Vitamin K (VITAMIN K2-VITAMIN D3 PO) Take 1 capsule by mouth daily with lunch. D3 2000iu/  K2500     vitamin E 200 UNIT capsule Take 400 Units by mouth daily with lunch.     No current facility-administered medications on file prior to visit.    Review of Systems  Constitutional:  Negative for activity change, appetite change, fatigue, fever and unexpected weight change.  HENT:  Negative for congestion, ear pain, rhinorrhea, sinus pressure and sore throat.   Eyes:  Negative for pain, redness and visual disturbance.  Respiratory:  Negative for cough, shortness of breath and wheezing.   Cardiovascular:  Negative for chest pain and palpitations.  Gastrointestinal:  Negative for abdominal pain, blood in stool, constipation and diarrhea.  Endocrine: Negative for polydipsia and polyuria.  Genitourinary:  Negative for dysuria, frequency and urgency.  Musculoskeletal:  Positive for back pain. Negative for arthralgias and myalgias.  Skin:  Negative for pallor and rash.  Allergic/Immunologic: Negative for environmental allergies.  Neurological:  Positive for numbness. Negative for dizziness, syncope and headaches.       Altered sensation in backs of legs  Not in buttocks     Hematological:  Negative for adenopathy. Does not bruise/bleed easily.  Psychiatric/Behavioral:  Negative for decreased concentration and dysphoric  mood. The patient is not nervous/anxious.        Objective:   Physical Exam Constitutional:      General: She is not in acute distress.    Appearance: Normal appearance. She is well-developed and normal weight. She is not ill-appearing or diaphoretic.  HENT:     Head: Normocephalic and atraumatic.     Mouth/Throat:     Mouth: Mucous membranes are moist.  Eyes:     General: No scleral icterus.    Conjunctiva/sclera: Conjunctivae normal.     Pupils: Pupils are equal, round, and reactive to light.  Neck:     Thyroid : No thyromegaly.     Vascular: No carotid bruit or JVD.  Cardiovascular:     Rate and Rhythm: Normal rate and regular rhythm.     Heart sounds: Normal heart sounds.     No gallop.     Comments: Both large (compressible tortuous) and spider varicosities in legs up to mid thigh Some tenderness  Mild swelling left ankle more than R Pulmonary:     Effort: Pulmonary effort is normal. No respiratory distress.     Breath sounds: Normal breath sounds. No wheezing or rales.  Abdominal:     General: There is no distension or abdominal bruit.     Palpations: Abdomen is soft.  Musculoskeletal:     Cervical back: Normal range of motion and neck supple.  Right lower leg: No edema.     Left lower leg: No edema.  Lymphadenopathy:     Cervical: No cervical adenopathy.  Skin:    General: Skin is warm and dry.     Coloration: Skin is not pale.     Findings: No bruising, erythema or rash.  Neurological:     Mental Status: She is alert.     Cranial Nerves: No cranial nerve deficit or facial asymmetry.     Sensory: No sensory deficit.     Motor: No weakness, atrophy or abnormal muscle tone.     Coordination: Coordination normal.     Gait: Gait normal.     Deep Tendon Reflexes: Reflexes are normal and symmetric. Reflexes normal.  Psychiatric:        Mood and Affect: Mood normal.           Assessment & Plan:   Problem List Items Addressed This Visit        Cardiovascular and Mediastinum   Varicose veins of both lower extremities   ? If causing her paresthesias and discomfort of LEs  Both large and spider  Encouraged compression  Trial of supp hose to waist during the day      Essential hypertension   bp in fair control at this time  BP Readings from Last 1 Encounters:  10/05/23 134/76   No changes needed Most recent labs reviewed  Disc lifstyle change with low sodium diet and exercise    BP: 134/76   Chlorthalidone  12.5 mg daily  Amlodipine  10 mg daily (inc from 50  Valsartan  320 mg daily   Tolerating well  Lab today      Relevant Orders   CBC with Differential/Platelet   Comprehensive metabolic panel with GFR   TSH     Endocrine   Hypothyroidism   TSH today  Levothyroxine  100 mcg daily  No clinical changes except leg paresthesias       Relevant Orders   TSH   Diabetes type 2, controlled (HCC)   Has been well controlled Some leg paresthesia- atypical for dm neuropathy  A1c today Continues metformin  1000 mg bid       Relevant Orders   Hemoglobin A1c     Other   Paresthesia of bilateral legs - Primary   Atypical pattern for peripheral neuropathy  Worse after inactivity but not during inactivity  Reassuring exam , notable for varicosities   Labs today  Encouraged to try supp hose to waist to see if this helps       Relevant Orders   CBC with Differential/Platelet   Comprehensive metabolic panel with GFR   TSH   Vitamin B12   History of iron deficiency   Iron /ferritin levels today  Takes ferrous sulfate  325 mg mwf       Relevant Orders   Iron   Ferritin

## 2023-10-05 NOTE — Assessment & Plan Note (Signed)
 Iron /ferritin levels today  Takes ferrous sulfate  325 mg mwf

## 2023-10-05 NOTE — Assessment & Plan Note (Signed)
 Atypical pattern for peripheral neuropathy  Worse after inactivity but not during inactivity  Reassuring exam , notable for varicosities   Labs today  Encouraged to try supp hose to waist to see if this helps

## 2023-10-05 NOTE — Assessment & Plan Note (Signed)
 bp in fair control at this time  BP Readings from Last 1 Encounters:  10/05/23 134/76   No changes needed Most recent labs reviewed  Disc lifstyle change with low sodium diet and exercise    BP: 134/76   Chlorthalidone  12.5 mg daily  Amlodipine  10 mg daily (inc from 50  Valsartan  320 mg daily   Tolerating well  Lab today

## 2023-10-05 NOTE — Assessment & Plan Note (Signed)
?   If causing her paresthesias and discomfort of LEs  Both large and spider  Encouraged compression  Trial of supp hose to waist during the day

## 2023-10-05 NOTE — Assessment & Plan Note (Signed)
 TSH today  Levothyroxine  100 mcg daily  No clinical changes except leg paresthesias

## 2023-10-18 DIAGNOSIS — H43813 Vitreous degeneration, bilateral: Secondary | ICD-10-CM | POA: Diagnosis not present

## 2023-10-18 DIAGNOSIS — Z961 Presence of intraocular lens: Secondary | ICD-10-CM | POA: Diagnosis not present

## 2023-10-18 DIAGNOSIS — H40003 Preglaucoma, unspecified, bilateral: Secondary | ICD-10-CM | POA: Diagnosis not present

## 2023-10-18 DIAGNOSIS — H35033 Hypertensive retinopathy, bilateral: Secondary | ICD-10-CM | POA: Diagnosis not present

## 2023-10-18 DIAGNOSIS — H353131 Nonexudative age-related macular degeneration, bilateral, early dry stage: Secondary | ICD-10-CM | POA: Diagnosis not present

## 2023-10-22 ENCOUNTER — Encounter: Payer: Self-pay | Admitting: Family Medicine

## 2023-11-22 ENCOUNTER — Ambulatory Visit (INDEPENDENT_AMBULATORY_CARE_PROVIDER_SITE_OTHER)

## 2023-11-22 DIAGNOSIS — Z23 Encounter for immunization: Secondary | ICD-10-CM

## 2023-11-22 NOTE — Progress Notes (Signed)
 Per orders of Dr. Laine Balls, injection of Flu vaccine given by Nellie Hummer in left deltoid. Patient tolerated injection well.

## 2023-11-23 ENCOUNTER — Other Ambulatory Visit: Payer: Self-pay | Admitting: Internal Medicine

## 2023-12-26 ENCOUNTER — Other Ambulatory Visit: Payer: Self-pay | Admitting: Internal Medicine

## 2024-01-09 DIAGNOSIS — I10 Essential (primary) hypertension: Secondary | ICD-10-CM | POA: Diagnosis not present

## 2024-01-09 DIAGNOSIS — E119 Type 2 diabetes mellitus without complications: Secondary | ICD-10-CM | POA: Diagnosis not present

## 2024-01-09 DIAGNOSIS — M810 Age-related osteoporosis without current pathological fracture: Secondary | ICD-10-CM | POA: Diagnosis not present

## 2024-01-09 DIAGNOSIS — N814 Uterovaginal prolapse, unspecified: Secondary | ICD-10-CM | POA: Diagnosis not present

## 2024-01-09 DIAGNOSIS — E039 Hypothyroidism, unspecified: Secondary | ICD-10-CM | POA: Diagnosis not present

## 2024-01-09 DIAGNOSIS — K219 Gastro-esophageal reflux disease without esophagitis: Secondary | ICD-10-CM | POA: Diagnosis not present

## 2024-01-09 DIAGNOSIS — J4489 Other specified chronic obstructive pulmonary disease: Secondary | ICD-10-CM | POA: Diagnosis not present

## 2024-01-09 DIAGNOSIS — N3946 Mixed incontinence: Secondary | ICD-10-CM | POA: Diagnosis not present

## 2024-01-09 DIAGNOSIS — E785 Hyperlipidemia, unspecified: Secondary | ICD-10-CM | POA: Diagnosis not present

## 2024-01-11 ENCOUNTER — Other Ambulatory Visit: Payer: Self-pay | Admitting: Family Medicine

## 2024-01-18 ENCOUNTER — Encounter: Payer: Self-pay | Admitting: Family Medicine

## 2024-01-18 ENCOUNTER — Ambulatory Visit: Admitting: Family Medicine

## 2024-01-18 ENCOUNTER — Ambulatory Visit: Payer: Self-pay

## 2024-01-18 VITALS — BP 128/76 | HR 85 | Temp 98.0°F | Ht 66.0 in | Wt 158.5 lb

## 2024-01-18 DIAGNOSIS — S51011A Laceration without foreign body of right elbow, initial encounter: Secondary | ICD-10-CM

## 2024-01-18 DIAGNOSIS — S6000XA Contusion of unspecified finger without damage to nail, initial encounter: Secondary | ICD-10-CM | POA: Diagnosis not present

## 2024-01-18 DIAGNOSIS — Y92009 Unspecified place in unspecified non-institutional (private) residence as the place of occurrence of the external cause: Secondary | ICD-10-CM | POA: Diagnosis not present

## 2024-01-18 DIAGNOSIS — W19XXXA Unspecified fall, initial encounter: Secondary | ICD-10-CM

## 2024-01-18 DIAGNOSIS — Z23 Encounter for immunization: Secondary | ICD-10-CM | POA: Diagnosis not present

## 2024-01-18 DIAGNOSIS — S61213A Laceration without foreign body of left middle finger without damage to nail, initial encounter: Secondary | ICD-10-CM

## 2024-01-18 DIAGNOSIS — S8001XA Contusion of right knee, initial encounter: Secondary | ICD-10-CM

## 2024-01-18 NOTE — Telephone Encounter (Signed)
 Looks like nurse advise UC/ ER and daughter refused and wants to see PCP

## 2024-01-18 NOTE — Patient Instructions (Addendum)
 Keep wounds clean with soap and water (do not submerge)   No peroxide  Antibiotic ointment is ok   Steri strips on left middle finger  Keep bandage on for minimum 24 hours  If it bleeds through let us  know  If severe -go to urgent care (bleeding)  Non stick pad under coban wrap should work the best (antibiotic ointment is fine)  The strips will come off when ready    We can xray hand if needed - let us  know if the right fingers become more painful  Elevated injured areas the best you can    Cold compress on injuries is fine   Watch for redness /swelling or increased pain anywhere    Tetanus shot today

## 2024-01-18 NOTE — Assessment & Plan Note (Signed)
 Lost balance putting dish in oven (when turning around)  No LOC  Contusions/skin tear and finger laceration (see sep a/p) Mild back pain /close to baseline and no tenderness   Discussed fall prevention  Follow up next week for re check

## 2024-01-18 NOTE — Assessment & Plan Note (Signed)
 S/p fall in kitchen Object was clean  Tip of finger  Irregular/ C shaped / partial tear vs laceration  Cleaned and home and bleeding stopped with pressure  No evidence of fb or loss of sensation in finger  No nail involvement , no evidence of tendon injury   Today - cleaned with alcohol  swab and steri strips applied  Non stick pad and coban with slight pressure  Pt will watch for pain/ bleeding / numbness or swelling and update  Can change bandage daily with soap and water (do not submerge), non stick pad, antibiotic ointment and coban  Family will help with wound care  Call back and Er precautions noted in detail today   Follow up next week for re check  Td updated today

## 2024-01-18 NOTE — Assessment & Plan Note (Signed)
 2 horizontal tears (2 -3 cm) on right elbow from fall (normal rom of elbow and no bony tenderness) Clean Non stick pad/ triple antibiotic ointment and coban applied Wound care discussed Watch for signs and symptoms of infection Td updated   Follow up for re check next week

## 2024-01-18 NOTE — Assessment & Plan Note (Signed)
 Right hand - contused 4th and 5th fingers (in setting of arthritis)  Pt has her baseline rom and minimal tenderness Will hold off on xray unless symptoms worsen  Encouraged elevation and cool compress prn  Tylenol  for pain if needed   Update if not starting to improve in a week or if worsening  Call back and Er precautions noted in detail today    Follow up next week for re check

## 2024-01-18 NOTE — Assessment & Plan Note (Signed)
 S/p fall in kitchen  Hematoma over patella with little to no tenderness and baseline rom  Do not suspect fracture  Discussed elevation/cool compress and tylenol  Re check next week

## 2024-01-18 NOTE — Progress Notes (Signed)
 Subjective:    Patient ID: Jordan Patterson, female    DOB: 05/29/1940, 83 y.o.   MRN: [id number]  HPI  Wt Readings from Last 3 Encounters:  01/18/24 158 lb 8 oz (71.9 kg)  10/05/23 158 lb 6 oz (71.8 kg)  08/16/23 157 lb 4 oz (71.3 kg)   25.58 kg/m  Vitals:   01/18/24 1458  BP: 128/76  Pulse: 85  Temp: 98 F (36.7 C)  SpO2: 97%    Pt presents for fall with injury of hand/arm /knee and finger laceration   Today Was putting dish of sweet potato in oven Lost balance  Does not think she broke anything  Hit the floor   Bruised right hand , especially 4,5th fingers Right knee  Right elbow -skin tear   Left middle finger - cut and was bleeding  Stopped bleeding with pressure and band aides   Did not hit head   Nothing is severely painful    Able to walk with no issues No groin pain  Back hurts as much as it usually does-nothing new    Patient Active Problem List   Diagnosis Date Noted   Fall at home 01/18/2024   Laceration of left middle finger 01/18/2024   Hematoma of right knee region 01/18/2024   Skin tear of right elbow without complication 01/18/2024   Contusion of finger of right hand 01/18/2024   Varicose veins of both lower extremities 10/05/2023   Paresthesia of bilateral legs 10/05/2023   History of iron deficiency 10/05/2023   Routine general medical examination at a health care facility 04/20/2023   Hearing loss secondary to cerumen impaction, bilateral 10/27/2022   Preoperative respiratory examination 04/05/2022   Frequent urination 02/09/2022   Constipation 12/14/2021   Preoperative cardiovascular examination 11/09/2021   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   Estrogen deficiency 04/07/2021   Thoracic compression fracture (HCC) 01/22/2021   Current use of proton pump inhibitor 03/11/2020   Chronic midline low back pain without sciatica 03/12/2019   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   Hyponatremia 12/14/2018   Laryngopharyngeal reflux (LPR)  08/24/2017   Osteoporosis 08/24/2017   Leg cramping 08/21/2017   Regurgitation of food    Prolapse of female pelvic organs 02/19/2017   History of compression fracture of spine 07/18/2014   Hypertensive retinopathy of both eyes 06/04/2014   Nonexudative age-related macular degeneration 06/04/2014   Posterior vitreous detachment of both eyes 06/04/2014   Pseudophakia of both eyes 06/04/2014   Right knee pain 05/30/2014   Advanced care planning/counseling discussion 01/24/2014   Health maintenance examination 01/24/2014   Dysgeusia 11/12/2013   Upper airway cough syndrome 03/19/2013   Allergic rhinitis 05/15/2012   Asthma, moderate persistent 03/09/2012   Medicare annual wellness visit, subsequent 01/14/2012   Hiatal hernia    GERD (gastroesophageal reflux disease)    Essential hypertension    Hyperlipidemia associated with type 2 diabetes mellitus (HCC)    Depression    Hypothyroidism    Diabetes type 2, controlled (HCC)    Osteoarthritis    Past Medical History:  Diagnosis Date   Allergic rhinitis    Arthritis    hands and knees   Chronic constipation    Environmental and seasonal allergies    GERD (gastroesophageal reflux disease)    Priscilla) EGD - mild esophageal dysmotility and small hiatal hernia   Hiatal hernia    History of esophageal stricture 2013   s/p dilatation   History of vertebral compression fracture 07/18/2014   S/p kyphoplasty T12,  T7 and T8   Hyperlipidemia    mild, diet controlled   Hypertension    Hypothyroidism    Macular degeneration of both eyes    Migraines    Moderate persistent asthma without complication    pulmonologist--- dr c. young   Osteoporosis 07/24/2014   w/  hx compression fx  T12,  T7 and T8 s/p kyphoplasty   Prolapse of vaginal walls    anterior and posterior   Type 2 diabetes mellitus (HCC)    followed by pcp   (04-15-2022  pt stated she check blood sugar daily in am fasting,  average 106--117)   Uterovaginal prolapse,  incomplete    Wears glasses    Past Surgical History:  Procedure Laterality Date   74 HOUR PH STUDY N/A 04/11/2017   Procedure: 24 HOUR PH STUDY;  Surgeon: Shila Gustav GAILS, MD;  Location: [facility];  Service: Endoscopy;  Laterality: N/A;   CATARACT EXTRACTION W/ INTRAOCULAR LENS IMPLANT Bilateral 2003   COLONOSCOPY  2017   COLPOCLEISIS N/A 04/19/2022   Procedure: Ladora Alexanders COLPOCLEISIS;  Surgeon: Marilynne Rosaline SAILOR, MD;  Location: [facility];  Service: Gynecology;  Laterality: N/A;   CYSTOSCOPY N/A 04/19/2022   Procedure: CYSTOSCOPY;  Surgeon: Marilynne Rosaline SAILOR, MD;  Location: [facility];  Service: Gynecology;  Laterality: N/A;   DILATION AND CURETTAGE OF UTERUS  1978   ESOPHAGEAL MANOMETRY N/A 04/11/2017   Procedure: ESOPHAGEAL MANOMETRY (EM);  Surgeon: Shila Gustav GAILS, MD;  Location: [facility];  Service: Endoscopy;  Laterality: N/A;   ESOPHAGOGASTRODUODENOSCOPY  06/25/2011   Procedure: ESOPHAGOGASTRODUODENOSCOPY (EGD);  Surgeon: Princella CHRISTELLA Nida, MD;  Location: [facility];  Service: Endoscopy;  Laterality: N/A;  no Xray   KYPHOPLASTY N/A 08/30/2014   Procedure: THORACIC TWELVE KYPHOPLASTY;  Surgeon: Lamar Peaches, MD   KYPHOPLASTY N/A 01/22/2021   Procedure: Thoracic eight, Thoracic nine KYPHOPLASTY;  Surgeon: Gillie Duncans, MD;  Location: [facility];  Service: Neurosurgery;  Laterality: N/A;   LAPAROSCOPY ABDOMEN DIAGNOSTIC     yrs ago;    To R/O endometriosis   PH IMPEDANCE STUDY N/A 04/11/2017   Procedure: PH IMPEDANCE STUDY;  Surgeon: Shila Gustav GAILS, MD;  Location: [facility];  Service: Endoscopy;  Laterality: N/A;   RECTOCELE REPAIR N/A 04/19/2022   Procedure: Levator plication with perineorrhaphy;  Surgeon: Marilynne Rosaline SAILOR, MD;  Location: [facility];  Service: Gynecology;  Laterality: N/A;   SAVORY DILATION  06/25/2011   Procedure: SAVORY DILATION;  Surgeon: Princella CHRISTELLA Nida, MD;  Location: [facility];   Service: Endoscopy;  Laterality: N/A;   TONSILLECTOMY AND ADENOIDECTOMY  1962   Social History   Tobacco Use   Smoking status: Never    Passive exposure: Never   Smokeless tobacco: Never  Vaping Use   Vaping status: Never Used  Substance Use Topics   Alcohol  use: No    Alcohol /week: 0.0 standard drinks of alcohol    Drug use: Never   Family History  Problem Relation Age of Onset   Mitral valve prolapse Father    Diabetes Father    Mitral valve prolapse Mother    Hypertension Mother    Stroke Mother 38       after valve surgery   Parkinson's disease Brother    Other Brother        POST WAR TRAUMA   Stroke Paternal Grandfather    Heart failure Maternal Grandfather    Cancer Neg Hx    Colon cancer Neg  Hx    Stomach cancer Neg Hx    Esophageal cancer Neg Hx    Pancreatic cancer Neg Hx    Liver disease Neg Hx    Allergies  Allergen Reactions   Ace Inhibitors Cough   Crestor  [Rosuvastatin ] Other (See Comments)    myalgias   Wellbutrin [Bupropion Hcl] Other (See Comments)    Unknown reaction   Cymbalta [Duloxetine Hcl] Palpitations and Other (See Comments)    headaches   Lipitor [Atorvastatin ] Cough   Pregabalin Other (See Comments)    Unknown reaction   Tegretol [Carbamazepine] Other (See Comments)    Dizziness, headache   Current Outpatient Medications on File Prior to Visit  Medication Sig Dispense Refill   Accu-Chek Softclix Lancets lancets Use to check blood sugar daily (dx. E11.9) 100 each 1   acetaminophen  (TYLENOL ) 500 MG tablet Take 1 tablet (500 mg total) by mouth every 6 (six) hours as needed (pain). 30 tablet 0   albuterol  (VENTOLIN  HFA) 108 (90 Base) MCG/ACT inhaler TAKE 2 PUFFS BY MOUTH EVERY 6 HOURS AS NEEDED FOR WHEEZE OR SHORTNESS OF BREATH 18 each 3   amLODipine  (NORVASC ) 10 MG tablet TAKE 1 TABLET BY MOUTH EVERY DAY 90 tablet 2   benzonatate  (TESSALON ) 200 MG capsule TAKE 1 CAPSULE (200 MG TOTAL) BY MOUTH 3 (THREE) TIMES DAILY AS NEEDED FOR COUGH.  180 capsule 3   blood glucose meter kit and supplies Dispense based on patient and insurance preference. Use qd and prn for uncontrolled diabetes. (FOR ICD-10 E10.9, E11.9). 1 each 1   budesonide -glycopyrrolate -formoterol  (BREZTRI  AEROSPHERE) 160-9-4.8 MCG/ACT AERO inhaler INHALE 2 PUFFS INTO THE LUNGS IN THE MORNING AND AT BEDTIME. RINSE MOUTH AFTER EACH USE. 32.1 each 10   Calcium  250 MG CAPS Take 500 mg by mouth at bedtime.     chlorpheniramine (CHLOR-TRIMETON) 4 MG tablet Take 4 mg by mouth in the morning, at noon, and at bedtime.     chlorthalidone  (HYGROTON ) 25 MG tablet Take 0.5 tablets (12.5 mg total) by mouth daily. 45 tablet 2   esomeprazole  (NEXIUM ) 40 MG capsule 1 CAPSULE BY MOUTH EVERY DAY BEFORE BREAKFAST 90 capsule 6   famotidine  (PEPCID ) 20 MG tablet TAKE 1 TABLET BY MOUTH EVERYDAY AT BEDTIME 90 tablet 3   ferrous sulfate  325 (65 FE) MG EC tablet Take 325 mg by mouth every Monday, Wednesday, and Friday. Takes in am     Garlic (GARLIQUE PO) Take 1 tablet by mouth daily.     glucose blood test strip Use as instructed 100 each 12   levothyroxine  (SYNTHROID ) 100 MCG tablet TAKE 1 TABLET BY MOUTH EVERY DAY BEFORE BREAKFAST 90 tablet 2   MAGNESIUM  LACTATE PO Take 2 tablets by mouth at bedtime. 210 mg each     metFORMIN  (GLUCOPHAGE ) 1000 MG tablet TAKE 1 TABLET (1,000 MG TOTAL) BY MOUTH TWICE A DAY WITH FOOD 180 tablet 0   montelukast  (SINGULAIR ) 10 MG tablet TAKE 1 TABLET BY MOUTH EVERYDAY AT BEDTIME 90 tablet 4   Multiple Vitamins-Minerals (EYE HEALTH) CAPS Take 1 capsule by mouth daily.     Nutritional Supplements (GLUCOSE MANAGEMENT PO) Take by mouth.     nystatin-triamcinolone  (MYCOLOG II) cream Apply 1 Application topically as needed.     Omega-3 Fatty Acids (OMEGA 3 500 PO) Take 1 capsule by mouth in the morning and at bedtime.     Polyvinyl Alcohol -Povidone (REFRESH OP) Place 1 drop into both eyes daily.      valsartan  (DIOVAN ) 320 MG tablet  TAKE 1 TABLET BY MOUTH EVERY DAY 90  tablet 2   Vitamin D -Vitamin K (VITAMIN K2-VITAMIN D3 PO) Take 1 capsule by mouth daily with lunch. D3 2000iu/  K2500     vitamin E 200 UNIT capsule Take 400 Units by mouth daily with lunch.     No current facility-administered medications on file prior to visit.    Review of Systems  Constitutional:  Negative for activity change, appetite change, fatigue, fever and unexpected weight change.  HENT:  Negative for congestion, ear pain, rhinorrhea, sinus pressure and sore throat.   Eyes:  Negative for pain, redness and visual disturbance.  Respiratory:  Negative for cough, shortness of breath and wheezing.   Cardiovascular:  Negative for chest pain and palpitations.  Gastrointestinal:  Negative for abdominal pain, blood in stool, constipation and diarrhea.  Endocrine: Negative for polydipsia and polyuria.  Genitourinary:  Negative for dysuria, frequency and urgency.  Musculoskeletal:  Positive for arthralgias and back pain. Negative for gait problem and myalgias.  Skin:  Positive for wound. Negative for pallor and rash.  Allergic/Immunologic: Negative for environmental allergies.  Neurological:  Negative for dizziness, tremors, seizures, syncope, facial asymmetry, speech difficulty, weakness, light-headedness, numbness and headaches.  Hematological:  Negative for adenopathy. Bruises/bleeds easily.  Psychiatric/Behavioral:  Negative for decreased concentration and dysphoric mood. The patient is not nervous/anxious.        Objective:   Physical Exam Constitutional:      General: She is not in acute distress.    Appearance: Normal appearance. She is normal weight. She is not ill-appearing or diaphoretic.  HENT:     Head: Normocephalic and atraumatic.     Comments: No evidence of head trauma  Eyes:     Conjunctiva/sclera: Conjunctivae normal.     Pupils: Pupils are equal, round, and reactive to light.  Cardiovascular:     Rate and Rhythm: Normal rate and regular rhythm.  Pulmonary:      Effort: Pulmonary effort is normal. No respiratory distress.  Musculoskeletal:     Cervical back: Normal range of motion and neck supple.     Comments: Marked arthritis changes of all fingers  Bruising of right 4th and 5th fingers  Rom is baseline in setting of mod to severe OA  Mildly tender to palpation   Right knee  Ecchymosis of skin on and around knee cap  No skin interruption of bleeding Baseline rom  Mild tenderness over patella Can bear weight without problems   Right elbow 2 skin tears No bony tenderness or swelling Normal rom   Skin:    General: Skin is warm and dry.     Coloration: Skin is not pale.     Findings: No erythema or rash.     Comments: Two 2-3 cm skin tears on right elbow  No longer bleeding  No redness Mildly tender  Dressed with antibiotic ointment/non stick pad and coban   Left middle finger  Irregular laceration of tip of finger - approx 1 cm  Bleeding stopped with pressure  Clean-no fb noted  Normal sensation and rom of digit  Cleaned and 2 steri strips applied  Non stick pad and coban (to comfort)      Neurological:     Mental Status: She is alert.     Cranial Nerves: No cranial nerve deficit.     Motor: No weakness.     Gait: Gait normal.     Deep Tendon Reflexes: Reflexes normal.     Comments: No  change in balance Baseline gait without needing assistance   Psychiatric:        Mood and Affect: Mood normal.     Comments: Pleasant and talkative             Assessment & Plan:   Problem List Items Addressed This Visit       Musculoskeletal and Integument   Skin tear of right elbow without complication   2 horizontal tears (2 -3 cm) on right elbow from fall (normal rom of elbow and no bony tenderness) Clean Non stick pad/ triple antibiotic ointment and coban applied Wound care discussed Watch for signs and symptoms of infection Td updated   Follow up for re check next week       Relevant Orders   Td :  Tetanus/diphtheria >7yo Preservative  free (Completed)     Other   Laceration of left middle finger - Primary   S/p fall in kitchen Object was clean  Tip of finger  Irregular/ C shaped / partial tear vs laceration  Cleaned and home and bleeding stopped with pressure  No evidence of fb or loss of sensation in finger  No nail involvement , no evidence of tendon injury   Today - cleaned with alcohol  swab and steri strips applied  Non stick pad and coban with slight pressure  Pt will watch for pain/ bleeding / numbness or swelling and update  Can change bandage daily with soap and water (do not submerge), non stick pad, antibiotic ointment and coban  Family will help with wound care  Call back and Er precautions noted in detail today   Follow up next week for re check  Td updated today       Relevant Orders   Td : Tetanus/diphtheria >7yo Preservative  free (Completed)   Hematoma of right knee region   S/p fall in kitchen  Hematoma over patella with little to no tenderness and baseline rom  Do not suspect fracture  Discussed elevation/cool compress and tylenol  Re check next week       Fall at home   Lost balance putting dish in oven (when turning around)  No LOC  Contusions/skin tear and finger laceration (see sep a/p) Mild back pain /close to baseline and no tenderness   Discussed fall prevention  Follow up next week for re check       Contusion of finger of right hand   Right hand - contused 4th and 5th fingers (in setting of arthritis)  Pt has her baseline rom and minimal tenderness Will hold off on xray unless symptoms worsen  Encouraged elevation and cool compress prn  Tylenol  for pain if needed   Update if not starting to improve in a week or if worsening  Call back and Er precautions noted in detail today    Follow up next week for re check       Relevant Orders   Td : Tetanus/diphtheria >7yo Preservative  free (Completed)

## 2024-01-18 NOTE — Telephone Encounter (Signed)
 Keep clean with soap and water  If bleeding will not stop- does need urgent care visit if nothing open here  Otherwise  Is a nurse visit available for a tetanus shot ?

## 2024-01-18 NOTE — Telephone Encounter (Addendum)
 Appt scheduled today at 3pm, per Dr. Randeen. We don't have any nurse visits appts and pt needs Td vaccine, also called pt and spoke with daughter and she said bleeding had stopped but cut looked a little deep. After telling Dr. Randeen that she decided to [facility] and see pt today at 3pm.

## 2024-01-18 NOTE — Telephone Encounter (Signed)
 FYI Only or Action Required?: Action required by provider: request for appointment.  Patient was last seen in primary care on 10/05/2023 by Randeen Laine LABOR, MD.  Called Nurse Triage reporting Laceration.  Symptoms began today.  Interventions attempted: Ice/heat application.  Symptoms are: unchanged.  Triage Disposition: See Physician Within 24 Hours  Patient/caregiver understands and will follow disposition?: No, wishes to speak with PCP   Copied from CRM #[id number]. Topic: Clinical - Red Word Triage >> Jan 18, 2024  1:20 PM Drema MATSU wrote: [facility] that prompted transfer to Nurse Triage: Patient had an accident this morning and cut her finger. Daughter wants to make sure it is not infected. Reason for Disposition  No prior tetanus shots (or is not fully vaccinated)  Answer Assessment - Initial Assessment Questions No available appts today. Advised UC now and ED if symptoms worsen. Advised tetanus vaccine, overdue, last on file recording; 01/14/2012  Pt's daughter requests appt today.  Provided [facility] at [facility].  1. APPEARANCE of INJURY: What does the injury look like?      Cut, sees tissue, no bone; no redness, reports swelling and bruising 2. ONSET: How long ago did the injury occur?      Today; 30 minutes ago 3. LOCATION: Where is the injury located?      Left hand, 3rd digit; tip of finger 4. SIZE: How large is the cut?      1/2ich 5. BLEEDING: Is it bleeding now? If Yes, ask: Is it difficult to stop?      Yes,  6. PAIN: Is there any pain? If Yes, ask: How bad is the pain? (Scale 0-10; or none, mild, moderate, severe)     No; just sore 7. MECHANISM: Tell me how it happened.      Unsure what she cut on, cut on oven door;metal. Thinks it's oven door 8. TETANUS: When was your last tetanus booster?  01/14/2012; over due  Protocols used: Cuts and Lacerations-A-AH

## 2024-01-26 ENCOUNTER — Ambulatory Visit
Admission: RE | Admit: 2024-01-26 | Discharge: 2024-01-26 | Disposition: A | Source: Ambulatory Visit | Attending: Family Medicine | Admitting: Family Medicine

## 2024-01-26 ENCOUNTER — Ambulatory Visit: Admitting: Family Medicine

## 2024-01-26 ENCOUNTER — Encounter: Payer: Self-pay | Admitting: Family Medicine

## 2024-01-26 ENCOUNTER — Ambulatory Visit: Payer: Self-pay | Admitting: Family Medicine

## 2024-01-26 VITALS — BP 124/70 | HR 91 | Temp 97.8°F | Ht 66.0 in | Wt 159.2 lb

## 2024-01-26 DIAGNOSIS — S60051A Contusion of right little finger without damage to nail, initial encounter: Secondary | ICD-10-CM

## 2024-01-26 DIAGNOSIS — S6000XA Contusion of unspecified finger without damage to nail, initial encounter: Secondary | ICD-10-CM | POA: Diagnosis not present

## 2024-01-26 DIAGNOSIS — S51011A Laceration without foreign body of right elbow, initial encounter: Secondary | ICD-10-CM

## 2024-01-26 DIAGNOSIS — S60041A Contusion of right ring finger without damage to nail, initial encounter: Secondary | ICD-10-CM | POA: Diagnosis not present

## 2024-01-26 DIAGNOSIS — S61213A Laceration without foreign body of left middle finger without damage to nail, initial encounter: Secondary | ICD-10-CM | POA: Diagnosis not present

## 2024-01-26 DIAGNOSIS — S6991XA Unspecified injury of right wrist, hand and finger(s), initial encounter: Secondary | ICD-10-CM | POA: Diagnosis not present

## 2024-01-26 DIAGNOSIS — S8001XA Contusion of right knee, initial encounter: Secondary | ICD-10-CM

## 2024-01-26 MED ORDER — CEPHALEXIN 500 MG PO CAPS: 500.0000 mg | ORAL_CAPSULE | Freq: Two times a day (BID) | ORAL | 0 refills | Status: AC

## 2024-01-26 NOTE — Assessment & Plan Note (Signed)
 Improved appearance of right 4,5th fingers with preserved rom More soreness per pt  Xray noted no fractures   Suspect due to arthritis Can try tylenol /heat prn

## 2024-01-26 NOTE — Assessment & Plan Note (Signed)
 Re check today Steri strips in place /no bleeding  Some erythema-unsure if from pressure of wrapping or early infection  Will treat with keflex  and update  Overall reassuring exam Encouraged to continue soap/water cleanse/ non stick pad and coban to protect  As healing continues a finger tip protector may be useful Encouraged not to submerge/ avoid trauma   Continue to monitor  Call back and Er precautions noted in detail today

## 2024-01-26 NOTE — Patient Instructions (Addendum)
 Take keflex  - this covers for skin infection , as directed  Xray of right hand today   We will reach out with results  Keep wounds clean with soap and water  Keep changing the bandage on your finger and be careful not to traumatize it   When ready-running under water is ok    Watch for increase redness/swelling/pain or drainage  Let us  know

## 2024-01-26 NOTE — Assessment & Plan Note (Signed)
 Improved Abrasion healing and swelling/bruising decreased Can bear weight   Encouraged to continue cold compress prn

## 2024-01-26 NOTE — Assessment & Plan Note (Signed)
 Much improved  Covered with band aides  No signs of infection

## 2024-01-26 NOTE — Progress Notes (Signed)
 Subjective:    Patient ID: Jordan Patterson, female    DOB: 1941-01-22, 83 y.o.   MRN: [id number]  HPI  Wt Readings from Last 3 Encounters:  01/26/24 159 lb 4 oz (72.2 kg)  01/18/24 158 lb 8 oz (71.9 kg)  10/05/23 158 lb 6 oz (71.8 kg)   25.70 kg/m  Vitals:   01/26/24 1025  BP: 124/70  Pulse: 91  Temp: 97.8 F (36.6 C)  SpO2: 96%   Pt presents for follow up of  Injuries after a fall   Contusion right hand   Hematoma right knee  Ice helped  Much better    Laceration left middle finger Doing ok  Very tender  One night it throbbed  Is improved now     Skin tears right elbow  One oozed a bit more   Back is more painful than normal   Xray right hand DG Hand Complete Right Result Date: 01/26/2024 CLINICAL DATA:  Right 4th and 5th finger injuries sustained in fall. Swelling and bruising. EXAM: RIGHT HAND - COMPLETE 3+ VIEW COMPARISON:  None Available. FINDINGS: Multifocal osteoarthritis, most severe within the proximal interphalangeal joints of the ring and small fingers. There is lesser involvement of the additional interphalangeal and metacarpal phalangeal joints. The intercarpal joint spaces are relatively preserved. There is no evidence of acute fracture or dislocation. Soft tissue swelling noted in the fingers, which may relate to the underlying osteoarthritis. No evidence of foreign body or soft tissue emphysema. IMPRESSION: No evidence of acute fracture or dislocation. Multifocal osteoarthritis as described. Electronically Signed   By: Elsie Perone M.D.   On: 01/26/2024 11:43      Patient Active Problem List   Diagnosis Date Noted   Fall at home 01/18/2024   Laceration of left middle finger 01/18/2024   Hematoma of right knee region 01/18/2024   Skin tear of right elbow without complication 01/18/2024   Contusion of finger of right hand 01/18/2024   Varicose veins of both lower extremities 10/05/2023   Paresthesia of bilateral legs 10/05/2023   History  of iron deficiency 10/05/2023   Routine general medical examination at a health care facility 04/20/2023   Hearing loss secondary to cerumen impaction, bilateral 10/27/2022   Preoperative respiratory examination 04/05/2022   Frequent urination 02/09/2022   Constipation 12/14/2021   Preoperative cardiovascular examination 11/09/2021   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   Estrogen deficiency 04/07/2021   Thoracic compression fracture (HCC) 01/22/2021   Current use of proton pump inhibitor 03/11/2020   Chronic midline low back pain without sciatica 03/12/2019   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   Hyponatremia 12/14/2018   Laryngopharyngeal reflux (LPR) 08/24/2017   Osteoporosis 08/24/2017   Leg cramping 08/21/2017   Regurgitation of food    Prolapse of female pelvic organs 02/19/2017   History of compression fracture of spine 07/18/2014   Hypertensive retinopathy of both eyes 06/04/2014   Nonexudative age-related macular degeneration 06/04/2014   Posterior vitreous detachment of both eyes 06/04/2014   Pseudophakia of both eyes 06/04/2014   Right knee pain 05/30/2014   Advanced care planning/counseling discussion 01/24/2014   Health maintenance examination 01/24/2014   Dysgeusia 11/12/2013   Upper airway cough syndrome 03/19/2013   Allergic rhinitis 05/15/2012   Asthma, moderate persistent 03/09/2012   Medicare annual wellness visit, subsequent 01/14/2012   Hiatal hernia    GERD (gastroesophageal reflux disease)    Essential hypertension    Hyperlipidemia associated with type 2 diabetes mellitus (HCC)    Depression  Hypothyroidism    Diabetes type 2, controlled (HCC)    Osteoarthritis    Past Medical History:  Diagnosis Date   Allergic rhinitis    Arthritis    hands and knees   Chronic constipation    Environmental and seasonal allergies    GERD (gastroesophageal reflux disease)    Priscilla) EGD - mild esophageal dysmotility and small hiatal hernia   Hiatal hernia    History  of esophageal stricture 2013   s/p dilatation   History of vertebral compression fracture 07/18/2014   S/p kyphoplasty T12, T7 and T8   Hyperlipidemia    mild, diet controlled   Hypertension    Hypothyroidism    Macular degeneration of both eyes    Migraines    Moderate persistent asthma without complication    pulmonologist--- dr c. young   Osteoporosis 07/24/2014   w/  hx compression fx  T12,  T7 and T8 s/p kyphoplasty   Prolapse of vaginal walls    anterior and posterior   Type 2 diabetes mellitus (HCC)    followed by pcp   (04-15-2022  pt stated she check blood sugar daily in am fasting,  average 106--117)   Uterovaginal prolapse, incomplete    Wears glasses    Past Surgical History:  Procedure Laterality Date   30 HOUR PH STUDY N/A 04/11/2017   Procedure: 24 HOUR PH STUDY;  Surgeon: Shila Gustav GAILS, MD;  Location: [facility];  Service: Endoscopy;  Laterality: N/A;   CATARACT EXTRACTION W/ INTRAOCULAR LENS IMPLANT Bilateral 2003   COLONOSCOPY  2017   COLPOCLEISIS N/A 04/19/2022   Procedure: Ladora Alexanders COLPOCLEISIS;  Surgeon: Marilynne Rosaline SAILOR, MD;  Location: [facility];  Service: Gynecology;  Laterality: N/A;   CYSTOSCOPY N/A 04/19/2022   Procedure: CYSTOSCOPY;  Surgeon: Marilynne Rosaline SAILOR, MD;  Location: [facility];  Service: Gynecology;  Laterality: N/A;   DILATION AND CURETTAGE OF UTERUS  1978   ESOPHAGEAL MANOMETRY N/A 04/11/2017   Procedure: ESOPHAGEAL MANOMETRY (EM);  Surgeon: Shila Gustav GAILS, MD;  Location: [facility];  Service: Endoscopy;  Laterality: N/A;   ESOPHAGOGASTRODUODENOSCOPY  06/25/2011   Procedure: ESOPHAGOGASTRODUODENOSCOPY (EGD);  Surgeon: Princella CHRISTELLA Nida, MD;  Location: [facility];  Service: Endoscopy;  Laterality: N/A;  no Xray   KYPHOPLASTY N/A 08/30/2014   Procedure: THORACIC TWELVE KYPHOPLASTY;  Surgeon: Lamar Peaches, MD   KYPHOPLASTY N/A 01/22/2021   Procedure: Thoracic eight, Thoracic nine  KYPHOPLASTY;  Surgeon: Gillie Duncans, MD;  Location: [facility];  Service: Neurosurgery;  Laterality: N/A;   LAPAROSCOPY ABDOMEN DIAGNOSTIC     yrs ago;    To R/O endometriosis   PH IMPEDANCE STUDY N/A 04/11/2017   Procedure: PH IMPEDANCE STUDY;  Surgeon: Shila Gustav GAILS, MD;  Location: [facility];  Service: Endoscopy;  Laterality: N/A;   RECTOCELE REPAIR N/A 04/19/2022   Procedure: Levator plication with perineorrhaphy;  Surgeon: Marilynne Rosaline SAILOR, MD;  Location: [facility];  Service: Gynecology;  Laterality: N/A;   SAVORY DILATION  06/25/2011   Procedure: SAVORY DILATION;  Surgeon: Princella CHRISTELLA Nida, MD;  Location: [facility];  Service: Endoscopy;  Laterality: N/A;   TONSILLECTOMY AND ADENOIDECTOMY  1962   Social History   Tobacco Use   Smoking status: Never    Passive exposure: Never   Smokeless tobacco: Never  Vaping Use   Vaping status: Never Used  Substance Use Topics   Alcohol  use: No    Alcohol /week: 0.0 standard drinks of alcohol   Drug use: Never   Family History  Problem Relation Age of Onset   Mitral valve prolapse Father    Diabetes Father    Mitral valve prolapse Mother    Hypertension Mother    Stroke Mother 52       after valve surgery   Parkinson's disease Brother    Other Brother        POST WAR TRAUMA   Stroke Paternal Grandfather    Heart failure Maternal Grandfather    Cancer Neg Hx    Colon cancer Neg Hx    Stomach cancer Neg Hx    Esophageal cancer Neg Hx    Pancreatic cancer Neg Hx    Liver disease Neg Hx    Allergies  Allergen Reactions   Ace Inhibitors Cough   Crestor  [Rosuvastatin ] Other (See Comments)    myalgias   Wellbutrin [Bupropion Hcl] Other (See Comments)    Unknown reaction   Cymbalta [Duloxetine Hcl] Palpitations and Other (See Comments)    headaches   Lipitor [Atorvastatin ] Cough   Pregabalin Other (See Comments)    Unknown reaction   Tegretol [Carbamazepine] Other (See Comments)    Dizziness, headache    Current Outpatient Medications on File Prior to Visit  Medication Sig Dispense Refill   Accu-Chek Softclix Lancets lancets Use to check blood sugar daily (dx. E11.9) 100 each 1   acetaminophen  (TYLENOL ) 500 MG tablet Take 1 tablet (500 mg total) by mouth every 6 (six) hours as needed (pain). 30 tablet 0   albuterol  (VENTOLIN  HFA) 108 (90 Base) MCG/ACT inhaler TAKE 2 PUFFS BY MOUTH EVERY 6 HOURS AS NEEDED FOR WHEEZE OR SHORTNESS OF BREATH 18 each 3   amLODipine  (NORVASC ) 10 MG tablet TAKE 1 TABLET BY MOUTH EVERY DAY 90 tablet 2   benzonatate  (TESSALON ) 200 MG capsule TAKE 1 CAPSULE (200 MG TOTAL) BY MOUTH 3 (THREE) TIMES DAILY AS NEEDED FOR COUGH. 180 capsule 3   blood glucose meter kit and supplies Dispense based on patient and insurance preference. Use qd and prn for uncontrolled diabetes. (FOR ICD-10 E10.9, E11.9). 1 each 1   budesonide -glycopyrrolate -formoterol  (BREZTRI  AEROSPHERE) 160-9-4.8 MCG/ACT AERO inhaler INHALE 2 PUFFS INTO THE LUNGS IN THE MORNING AND AT BEDTIME. RINSE MOUTH AFTER EACH USE. 32.1 each 10   Calcium  250 MG CAPS Take 500 mg by mouth at bedtime.     chlorpheniramine (CHLOR-TRIMETON) 4 MG tablet Take 4 mg by mouth in the morning, at noon, and at bedtime.     chlorthalidone  (HYGROTON ) 25 MG tablet Take 0.5 tablets (12.5 mg total) by mouth daily. 45 tablet 2   esomeprazole  (NEXIUM ) 40 MG capsule 1 CAPSULE BY MOUTH EVERY DAY BEFORE BREAKFAST 90 capsule 6   famotidine  (PEPCID ) 20 MG tablet TAKE 1 TABLET BY MOUTH EVERYDAY AT BEDTIME 90 tablet 3   ferrous sulfate  325 (65 FE) MG EC tablet Take 325 mg by mouth every Monday, Wednesday, and Friday. Takes in am     Garlic (GARLIQUE PO) Take 1 tablet by mouth daily.     glucose blood test strip Use as instructed 100 each 12   levothyroxine  (SYNTHROID ) 100 MCG tablet TAKE 1 TABLET BY MOUTH EVERY DAY BEFORE BREAKFAST 90 tablet 2   MAGNESIUM  LACTATE PO Take 2 tablets by mouth at bedtime. 210 mg each     metFORMIN  (GLUCOPHAGE ) 1000 MG  tablet TAKE 1 TABLET (1,000 MG TOTAL) BY MOUTH TWICE A DAY WITH FOOD 180 tablet 0   montelukast  (SINGULAIR ) 10 MG tablet TAKE  1 TABLET BY MOUTH EVERYDAY AT BEDTIME 90 tablet 4   Multiple Vitamins-Minerals (EYE HEALTH) CAPS Take 1 capsule by mouth daily.     Nutritional Supplements (GLUCOSE MANAGEMENT PO) Take by mouth.     nystatin-triamcinolone  (MYCOLOG II) cream Apply 1 Application topically as needed.     Omega-3 Fatty Acids (OMEGA 3 500 PO) Take 1 capsule by mouth in the morning and at bedtime.     Polyvinyl Alcohol -Povidone (REFRESH OP) Place 1 drop into both eyes daily.      valsartan  (DIOVAN ) 320 MG tablet TAKE 1 TABLET BY MOUTH EVERY DAY 90 tablet 2   Vitamin D -Vitamin K (VITAMIN K2-VITAMIN D3 PO) Take 1 capsule by mouth daily with lunch. D3 2000iu/  K2500     vitamin E 200 UNIT capsule Take 400 Units by mouth daily with lunch.     No current facility-administered medications on file prior to visit.    Review of Systems  Constitutional:  Negative for activity change, appetite change, fatigue, fever and unexpected weight change.  HENT:  Negative for congestion, ear pain, rhinorrhea, sinus pressure and sore throat.   Eyes:  Negative for pain, redness and visual disturbance.  Respiratory:  Negative for cough, shortness of breath and wheezing.   Cardiovascular:  Negative for chest pain and palpitations.  Gastrointestinal:  Negative for abdominal pain, blood in stool, constipation and diarrhea.  Endocrine: Negative for polydipsia and polyuria.  Genitourinary:  Negative for dysuria, frequency and urgency.  Musculoskeletal:  Positive for arthralgias and back pain. Negative for myalgias.  Skin:  Positive for wound. Negative for pallor and rash.  Allergic/Immunologic: Negative for environmental allergies.  Neurological:  Negative for dizziness, syncope and headaches.  Hematological:  Negative for adenopathy. Does not bruise/bleed easily.  Psychiatric/Behavioral:  Negative for decreased  concentration and dysphoric mood. The patient is not nervous/anxious.        Objective:   Physical Exam Constitutional:      General: She is not in acute distress.    Appearance: Normal appearance. She is well-developed and normal weight. She is not ill-appearing or diaphoretic.  HENT:     Head: Normocephalic and atraumatic.  Eyes:     Conjunctiva/sclera: Conjunctivae normal.     Pupils: Pupils are equal, round, and reactive to light.  Neck:     Thyroid : No thyromegaly.     Vascular: No carotid bruit or JVD.  Cardiovascular:     Rate and Rhythm: Normal rate and regular rhythm.     Heart sounds: Normal heart sounds.     No gallop.  Pulmonary:     Effort: Pulmonary effort is normal. No respiratory distress.     Breath sounds: Normal breath sounds. No wheezing or rales.  Abdominal:     General: There is no distension or abdominal bruit.     Palpations: Abdomen is soft.  Musculoskeletal:     Cervical back: Normal range of motion and neck supple.     Right lower leg: No edema.     Left lower leg: No edema.     Comments: No new spinal tenderness  Right hand , 4,5th fingers are less bruised and swollen , with fair rom limited by mod to severe oa with deformity   Right knee- ecchymosis /hematoma size is decreased  Minimally tender  Better rom   Normal rom right elbow  Lymphadenopathy:     Cervical: No cervical adenopathy.  Skin:    General: Skin is warm and dry.  Coloration: Skin is not pale.     Findings: Bruising and erythema present. No rash.     Comments: Finger laceration left middle Healing by 2nd intention  Some mild maceration of wound edges (wound edges are approximated) Mild erythema with warmth / no drainage or bleeding  No swelling or streaking  Sensitive/tender at tip of finger   2 skin tears on right elbow- healing/no redness or drainage Covered with band aides   Neurological:     Mental Status: She is alert.     Coordination: Coordination normal.      Deep Tendon Reflexes: Reflexes are normal and symmetric. Reflexes normal.  Psychiatric:        Mood and Affect: Mood normal.           Assessment & Plan:   Problem List Items Addressed This Visit       Musculoskeletal and Integument   Skin tear of right elbow without complication   Much improved  Covered with band aides  No signs of infection         Other   Laceration of left middle finger - Primary   Re check today Steri strips in place /no bleeding  Some erythema-unsure if from pressure of wrapping or early infection  Will treat with keflex  and update  Overall reassuring exam Encouraged to continue soap/water cleanse/ non stick pad and coban to protect  As healing continues a finger tip protector may be useful Encouraged not to submerge/ avoid trauma   Continue to monitor  Call back and Er precautions noted in detail today        Hematoma of right knee region   Improved Abrasion healing and swelling/bruising decreased Can bear weight   Encouraged to continue cold compress prn      Contusion of finger of right hand   Improved appearance of right 4,5th fingers with preserved rom More soreness per pt  Xray noted no fractures   Suspect due to arthritis Can try tylenol /heat prn       Relevant Orders   DG Hand Complete Right (Completed)

## 2024-02-09 NOTE — Telephone Encounter (Signed)
 Thanks for the heads up  I am out of the office until next week Please schedule a follow up with first available (or UC if necessary)  Keep watching for signs of infection

## 2024-02-22 ENCOUNTER — Ambulatory Visit: Payer: Self-pay

## 2024-02-22 NOTE — Telephone Encounter (Signed)
 Left VM requesting pt to call the office back

## 2024-02-22 NOTE — Telephone Encounter (Signed)
 I think it is best that she go to UC (if nothing in office available) because it is day before a holiday -since symptoms have worsened

## 2024-02-22 NOTE — Telephone Encounter (Signed)
 Appt not scheduled until 03/03/23, FYI to PCP

## 2024-02-22 NOTE — Telephone Encounter (Signed)
 Called patient reviewed all information and repeated back to me. Will call if any questions.   She declined to be seen at urgent care she states she is scheduled to see Dr. Randeen next Friday and she will wait until then. She declines any infection symptoms. Have reviewed all red words with her and if any changes during the holiday she will go to ED or urgent care.

## 2024-02-22 NOTE — Telephone Encounter (Signed)
 FYI Only or Action Required?: FYI only for provider: appointment scheduled on 03/02/2024.  Patient was last seen in primary care on 01/26/2024 by Randeen Laine LABOR, MD.  Called Nurse Triage reporting Pain.  Symptoms began x month.  Interventions attempted: Rest, hydration, or home remedies and Other: dressing changes.  Symptoms are: unchanged.  Triage Disposition: See PCP Within 2 Weeks  Patient/caregiver understands and will follow disposition?: Yes    Copied from CRM [id number]. Topic: Clinical - Red Word Triage >> Feb 22, 2024  9:12 AM Adelita E wrote: [facility] that prompted transfer to Nurse Triage: Patient has cut to middle finger on left hand, PCP is aware of this issue, but patient stated that it is still painful at times and wants to make sure it is healing correctly. Reason for Disposition  [1] MILD pain (e.g., does not interfere with normal activities) AND [2] present > 7 days  Answer Assessment - Initial Assessment Questions 1. ONSET: When did the pain start?      X month 2. LOCATION and RADIATION: Where is the pain located?  (e.g., fingertip, around nail, joint, entire      Left hand middle finger 3. SEVERITY: How bad is the pain? What does it keep you from doing?   (Scale 1-10; or mild, moderate, severe)     4 or 5/10 4. APPEARANCE: What does the finger look like? (e.g., redness, swelling, bruising, pallor)     Unsure if drainage - possible wet around laceration 5. WORK OR EXERCISE: Has there been any recent work or exercise that involved this part (i.e., fingers or hand) of the body?     na 6. CAUSE: What do you think is causing the pain?     na 7. AGGRAVATING FACTORS: What makes the pain worse? (e.g., using computer)     na 8. OTHER SYMPTOMS: Do you have any other symptoms? (e.g., fever, neck pain, numbness)     Unsure if red or bruised, fingernail & nail black 9. PREGNANCY: Is there any chance you are pregnant? When was your last menstrual  period?     Na  Pt states pain is worse at night than during the day.  Also pt is unsure if finger is healing properly: possible wet/drainage from finger: would like someone to look at finger.  No warmth or swelling noted.  Protocols used: Finger Pain-A-AH

## 2024-03-02 ENCOUNTER — Ambulatory Visit: Admitting: Family Medicine

## 2024-03-05 ENCOUNTER — Encounter: Payer: Self-pay | Admitting: *Deleted

## 2024-03-15 ENCOUNTER — Ambulatory Visit (INDEPENDENT_AMBULATORY_CARE_PROVIDER_SITE_OTHER)

## 2024-03-15 VITALS — Ht 66.5 in | Wt 152.0 lb

## 2024-03-15 DIAGNOSIS — Z0000 Encounter for general adult medical examination without abnormal findings: Secondary | ICD-10-CM

## 2024-03-15 NOTE — Patient Instructions (Signed)
 Jordan Patterson,  Thank you for taking the time for your Medicare Wellness Visit. I appreciate your continued commitment to your health goals. Please review the care plan we discussed, and feel free to reach out if I can assist you further.  Please note that Annual Wellness Visits do not include a physical exam. Some assessments may be limited, especially if the visit was conducted virtually. If needed, we may recommend an in-person follow-up with your provider.  Ongoing Care Seeing your primary care provider every 3 to 6 months helps us  monitor your health and provide consistent, personalized care.   Referrals If a referral was made during today's visit and you haven't received any updates within two weeks, please contact the referred provider directly to check on the status.  Recommended Screenings:  Health Maintenance  Topic Date Due   Zoster (Shingles) Vaccine (1 of 2) Never done   Medicare Annual Wellness Visit  03/15/2024   COVID-19 Vaccine (7 - 2025-26 season) 02/02/2026*   Hemoglobin A1C  04/06/2024   Complete foot exam   04/19/2024   Eye exam for diabetics  06/26/2024   Kidney health urinalysis for diabetes  07/26/2024   Yearly kidney function blood test for diabetes  10/04/2024   Pneumococcal Vaccine for age over 86  Completed   Flu Shot  Completed   Osteoporosis screening with Bone Density Scan  Completed   Meningitis B Vaccine  Aged Out   DTaP/Tdap/Td vaccine  Discontinued  *Topic was postponed. The date shown is not the original due date.       03/14/2024    8:56 PM  Advanced Directives  Does Patient Have a Medical Advance Directive? Yes  Type of Estate Agent of [place];Living will  Does patient want to make changes to medical advance directive? No - Patient declined  Copy of Healthcare Power of Attorney in Chart? No - copy requested    Vision: Annual vision screenings are recommended for early detection of glaucoma, cataracts, and diabetic  retinopathy. These exams can also reveal signs of chronic conditions such as diabetes and high blood pressure.  Dental: Annual dental screenings help detect early signs of oral cancer, gum disease, and other conditions linked to overall health, including heart disease and diabetes.  Please see the attached documents for additional preventive care recommendations.

## 2024-03-15 NOTE — Progress Notes (Signed)
 "  Chief Complaint  Patient presents with   Medicare Wellness     Subjective:   Jordan Patterson is a 84 y.o. female who presents for a Medicare Annual Wellness Visit.  Visit info / Clinical Intake: Medicare Wellness Visit Type:: Subsequent Annual Wellness Visit Persons participating in visit and providing information:: patient Medicare Wellness Visit Mode:: Telephone If telephone:: video declined Since this visit was completed virtually, some vitals may be partially provided or unavailable. Missing vitals are due to the limitations of the virtual format.: Documented vitals are patient reported If Telephone or Video please confirm:: I connected with patient using audio/video enable telemedicine. I verified patient identity with two identifiers, discussed telehealth limitations, and patient agreed to proceed. Patient Location:: home Provider Location:: office Interpreter Needed?: No Pre-visit prep was completed: yes AWV questionnaire completed by patient prior to visit?: yes Date:: 03/14/24 Living arrangements:: (!) (Patient-Rptd) lives alone Patient's Overall Health Status Rating: (Patient-Rptd) good Typical amount of pain: (Patient-Rptd) some Does pain affect daily life?: (Patient-Rptd) no Are you currently prescribed opioids?: no  Dietary Habits and Nutritional Risks How many meals a day?: (Patient-Rptd) 2 Eats fruit and vegetables daily?: (Patient-Rptd) yes Most meals are obtained by: (Patient-Rptd) preparing own meals In the last 2 weeks, have you had any of the following?: none  Functional Status Activities of Daily Living (to include ambulation/medication): (Patient-Rptd) Independent Ambulation: (Patient-Rptd) Independent Medication Administration: (Patient-Rptd) Independent Home Management (perform basic housework or laundry): (Patient-Rptd) Independent Manage your own finances?: (Patient-Rptd) yes Primary transportation is: (Patient-Rptd) driving Concerns about vision?:  no *vision screening is required for WTM* Concerns about hearing?: no  Fall Screening Falls in the past year?: (Patient-Rptd) 1 Number of falls in past year: (Patient-Rptd) 0 Was there an injury with Fall?: 1 (cut finger) Fall Risk Category Calculator: (Patient-Rptd) 2 Patient Fall Risk Level: (Patient-Rptd) Moderate Fall Risk  Fall Risk Patient at Risk for Falls Due to: Medication side effect Fall risk Follow up: Falls prevention discussed; Education provided; Falls evaluation completed  Home and Transportation Safety: All rugs have non-skid backing?: (Patient-Rptd) yes All stairs or steps have railings?: (Patient-Rptd) yes Grab bars in the bathtub or shower?: (Patient-Rptd) yes Have non-skid surface in bathtub or shower?: (Patient-Rptd) yes Good home lighting?: (Patient-Rptd) yes Regular seat belt use?: (Patient-Rptd) yes Hospital stays in the last year:: (Patient-Rptd) no  Cognitive Assessment Difficulty concentrating, remembering, or making decisions? : (Patient-Rptd) no Will 6CIT or Mini Cog be Completed: yes What year is it?: 0 points What month is it?: 0 points Give patient an address phrase to remember (5 components): [address] [place] About what time is it?: 0 points Count backwards from 20 to 1: 0 points Say the months of the year in reverse: 0 points Repeat the address phrase from earlier: 0 points 6 CIT Score: 0 points  Advance Directives (For Healthcare) Does Patient Have a Medical Advance Directive?: Yes Does patient want to make changes to medical advance directive?: No - Patient declined Type of Advance Directive: Healthcare Power of [place]; Living will Copy of Healthcare Power of Attorney in Chart?: No - copy requested Copy of Living Will in Chart?: No - copy requested  Reviewed/Updated  Reviewed/Updated: Reviewed All (Medical, Surgical, Family, Medications, Allergies, Care Teams, Patient Goals)    Allergies (verified) Ace inhibitors, Crestor   [rosuvastatin ], Wellbutrin [bupropion hcl], Cymbalta [duloxetine hcl], Lipitor [atorvastatin ], Pregabalin, and Tegretol [carbamazepine]   Current Medications (verified) Outpatient Encounter Medications as of 03/15/2024  Medication Sig   Accu-Chek Softclix Lancets  lancets Use to check blood sugar daily (dx. E11.9)   acetaminophen  (TYLENOL ) 500 MG tablet Take 1 tablet (500 mg total) by mouth every 6 (six) hours as needed (pain).   albuterol  (VENTOLIN  HFA) 108 (90 Base) MCG/ACT inhaler TAKE 2 PUFFS BY MOUTH EVERY 6 HOURS AS NEEDED FOR WHEEZE OR SHORTNESS OF BREATH   amLODipine  (NORVASC ) 10 MG tablet TAKE 1 TABLET BY MOUTH EVERY DAY   benzonatate  (TESSALON ) 200 MG capsule TAKE 1 CAPSULE (200 MG TOTAL) BY MOUTH 3 (THREE) TIMES DAILY AS NEEDED FOR COUGH.   blood glucose meter kit and supplies Dispense based on patient and insurance preference. Use qd and prn for uncontrolled diabetes. (FOR ICD-10 E10.9, E11.9).   budesonide -glycopyrrolate -formoterol  (BREZTRI  AEROSPHERE) 160-9-4.8 MCG/ACT AERO inhaler INHALE 2 PUFFS INTO THE LUNGS IN THE MORNING AND AT BEDTIME. RINSE MOUTH AFTER EACH USE.   Calcium  250 MG CAPS Take 500 mg by mouth at bedtime.   chlorpheniramine (CHLOR-TRIMETON) 4 MG tablet Take 4 mg by mouth in the morning, at noon, and at bedtime.   chlorthalidone  (HYGROTON ) 25 MG tablet Take 0.5 tablets (12.5 mg total) by mouth daily.   esomeprazole  (NEXIUM ) 40 MG capsule 1 CAPSULE BY MOUTH EVERY DAY BEFORE BREAKFAST   famotidine  (PEPCID ) 20 MG tablet TAKE 1 TABLET BY MOUTH EVERYDAY AT BEDTIME   ferrous sulfate  325 (65 FE) MG EC tablet Take 325 mg by mouth every Monday, Wednesday, and Friday. Takes in am   Garlic (GARLIQUE PO) Take 1 tablet by mouth daily.   glucose blood test strip Use as instructed   levothyroxine  (SYNTHROID ) 100 MCG tablet TAKE 1 TABLET BY MOUTH EVERY DAY BEFORE BREAKFAST   MAGNESIUM  LACTATE PO Take 2 tablets by mouth at bedtime. 210 mg each   metFORMIN  (GLUCOPHAGE ) 1000 MG tablet  TAKE 1 TABLET (1,000 MG TOTAL) BY MOUTH TWICE A DAY WITH FOOD   montelukast  (SINGULAIR ) 10 MG tablet TAKE 1 TABLET BY MOUTH EVERYDAY AT BEDTIME   Multiple Vitamins-Minerals (EYE HEALTH) CAPS Take 1 capsule by mouth daily.   nystatin-triamcinolone  (MYCOLOG II) cream Apply 1 Application topically as needed.   Omega-3 Fatty Acids (OMEGA 3 500 PO) Take 1 capsule by mouth in the morning and at bedtime.   Polyvinyl Alcohol -Povidone (REFRESH OP) Place 1 drop into both eyes daily.    valsartan  (DIOVAN ) 320 MG tablet TAKE 1 TABLET BY MOUTH EVERY DAY   Vitamin D -Vitamin K (VITAMIN K2-VITAMIN D3 PO) Take 1 capsule by mouth daily with lunch. D3 2000iu/  K2500   vitamin E 200 UNIT capsule Take 400 Units by mouth daily with lunch.   cephALEXin  (KEFLEX ) 500 MG capsule Take 1 capsule (500 mg total) by mouth 2 (two) times daily. (Patient not taking: Reported on 03/15/2024)   Nutritional Supplements (GLUCOSE MANAGEMENT PO) Take by mouth. (Patient not taking: Reported on 03/15/2024)   No facility-administered encounter medications on file as of 03/15/2024.    History: Past Medical History:  Diagnosis Date   Allergic rhinitis    Allergy     Arthritis    hands and knees   Chronic constipation    Environmental and seasonal allergies    GERD (gastroesophageal reflux disease)    Priscilla) EGD - mild esophageal dysmotility and small hiatal hernia   Hiatal hernia    History of esophageal stricture 2013   s/p dilatation   History of vertebral compression fracture 07/18/2014   S/p kyphoplasty T12, T7 and T8   Hyperlipidemia    mild, diet controlled   Hypertension  Hypothyroidism    Macular degeneration of both eyes    Migraines    Moderate persistent asthma without complication    pulmonologist--- dr c. young   Osteoporosis 07/24/2014   w/  hx compression fx  T12,  T7 and T8 s/p kyphoplasty   Prolapse of vaginal walls    anterior and posterior   Type 2 diabetes mellitus (HCC)    followed by pcp    (04-15-2022  pt stated she check blood sugar daily in am fasting,  average 106--117)   Uterovaginal prolapse, incomplete    Wears glasses    Past Surgical History:  Procedure Laterality Date   47 HOUR PH STUDY N/A 04/11/2017   Procedure: 24 HOUR PH STUDY;  Surgeon: Shila Gustav GAILS, MD;  Location: [facility];  Service: Endoscopy;  Laterality: N/A;   CATARACT EXTRACTION W/ INTRAOCULAR LENS IMPLANT Bilateral 2003   COLONOSCOPY  2017   COLPOCLEISIS N/A 04/19/2022   Procedure: Ladora Alexanders COLPOCLEISIS;  Surgeon: Marilynne Rosaline SAILOR, MD;  Location: [facility];  Service: Gynecology;  Laterality: N/A;   CYSTOSCOPY N/A 04/19/2022   Procedure: CYSTOSCOPY;  Surgeon: Marilynne Rosaline SAILOR, MD;  Location: [facility];  Service: Gynecology;  Laterality: N/A;   DILATION AND CURETTAGE OF UTERUS  1978   ESOPHAGEAL MANOMETRY N/A 04/11/2017   Procedure: ESOPHAGEAL MANOMETRY (EM);  Surgeon: Shila Gustav GAILS, MD;  Location: [facility];  Service: Endoscopy;  Laterality: N/A;   ESOPHAGOGASTRODUODENOSCOPY  06/25/2011   Procedure: ESOPHAGOGASTRODUODENOSCOPY (EGD);  Surgeon: Princella CHRISTELLA Nida, MD;  [facility];  Service: Endoscopy;  Laterality: N/A;  no Xray   KYPHOPLASTY N/A 08/30/2014   Procedure: THORACIC TWELVE KYPHOPLASTY;  Surgeon: Lamar Peaches, MD   KYPHOPLASTY N/A 01/22/2021   Procedure: Thoracic eight, Thoracic nine KYPHOPLASTY;  Surgeon: Gillie Duncans, MD;  Location: [facility] OR;  Service: Neurosurgery;  Laterality: N/A;   LAPAROSCOPY ABDOMEN DIAGNOSTIC     yrs ago;    To R/O endometriosis   PH IMPEDANCE STUDY N/A 04/11/2017   Procedure: PH IMPEDANCE STUDY;  Surgeon: Shila Gustav GAILS, MD;  Location: [facility];  Service: Endoscopy;  Laterality: N/A;   RECTOCELE REPAIR N/A 04/19/2022   Procedure: Levator plication with perineorrhaphy;  Surgeon: Marilynne Rosaline SAILOR, MD;  Location[facility];  Service: Gynecology;  Laterality: N/A;   SAVORY  DILATION  06/25/2011   Procedure: SAVORY DILATION;  Surgeon: Princella CHRISTELLA Nida, MD;  Location: [facility];  Service: Endoscopy;  Laterality: N/A;   TONSILLECTOMY AND ADENOIDECTOMY  1962   Family History  Problem Relation Age of Onset   Mitral valve prolapse Father    Diabetes Father    Mitral valve prolapse Mother    Hypertension Mother    Stroke Mother 54       after valve surgery   Parkinson's disease Brother    Other Brother        POST WAR TRAUMA   Stroke Paternal Grandfather    Heart failure Maternal Grandfather    Cancer Neg Hx    Colon cancer Neg Hx    Stomach cancer Neg Hx    Esophageal cancer Neg Hx    Pancreatic cancer Neg Hx    Liver disease Neg Hx    Social History   Occupational History   Occupation: Retired  Tobacco Use   Smoking status: Never    Passive exposure: Never   Smokeless tobacco: Never  Vaping Use   Vaping status: Never Used  Substance and Sexual  Activity   Alcohol  use: Never   Drug use: Never   Sexual activity: Not Currently   Tobacco Counseling Counseling given: Not Answered  SDOH Screenings   Food Insecurity: No Food Insecurity (03/15/2024)  Housing: Unknown (03/15/2024)  Transportation Needs: No Transportation Needs (03/15/2024)  Utilities: Not At Risk (03/15/2024)  Alcohol  Screen: Low Risk (03/15/2024)  Depression (PHQ2-9): Low Risk (03/15/2024)  Financial Resource Strain: Low Risk (03/15/2024)  Physical Activity: Sufficiently Active (03/15/2024)  Social Connections: Moderately Isolated (03/15/2024)  Stress: No Stress Concern Present (03/15/2024)  Tobacco Use: Low Risk (03/15/2024)  Health Literacy: Adequate Health Literacy (03/15/2024)   See flowsheets for full screening details  Depression Screen PHQ 2 & 9 Depression Scale- Over the past 2 weeks, how often have you been bothered by any of the following problems? Little interest or pleasure in doing things: 0 Feeling down, depressed, or hopeless (PHQ Adolescent also includes...irritable):  0 PHQ-2 Total Score: 0 Trouble falling or staying asleep, or sleeping too much: 0 Feeling tired or having little energy: 0 Poor appetite or overeating (PHQ Adolescent also includes...weight loss): 0 Feeling bad about yourself - or that you are a failure or have let yourself or your family down: 0 Trouble concentrating on things, such as reading the newspaper or watching television (PHQ Adolescent also includes...like school work): 0 Moving or speaking so slowly that other people could have noticed. Or the opposite - being so fidgety or restless that you have been moving around a lot more than usual: 0 Thoughts that you would be better off dead, or of hurting yourself in some way: 0 PHQ-9 Total Score: 0 If you checked off any problems, how difficult have these problems made it for you to do your work, take care of things at home, or get along with other people?: Not difficult at all  Depression Treatment Depression Interventions/Treatment : EYV7-0 Score <4 Follow-up Not Indicated     Goals Addressed               This Visit's Progress     does not want to fall and remain independent (pt-stated)               Objective:    Today's Vitals   03/15/24 1130  Weight: 152 lb (68.9 kg)  Height: 5' 6.5 (1.689 m)   Body mass index is 24.17 kg/m.  Hearing/Vision screen Vision Screening - Comments:: Regular eye exams Immunizations and Health Maintenance Health Maintenance  Topic Date Due   Zoster Vaccines- Shingrix (1 of 2) Never done   COVID-19 Vaccine (7 - 2025-26 season) 02/02/2026 (Originally 10/24/2023)   HEMOGLOBIN A1C  04/06/2024   FOOT EXAM  04/19/2024   OPHTHALMOLOGY EXAM  06/26/2024   Diabetic kidney evaluation - Urine ACR  07/26/2024   Diabetic kidney evaluation - eGFR measurement  10/04/2024   Medicare Annual Wellness (AWV)  03/15/2025   Pneumococcal Vaccine: 50+ Years  Completed   Influenza Vaccine  Completed   Bone Density Scan  Completed   Meningococcal B  Vaccine  Aged Out   DTaP/Tdap/Td  Discontinued        Assessment/Plan:  This is a routine wellness examination for Jordan Patterson.  Patient Care Team: Tower, Laine LABOR, MD as PCP - General (Family Medicine) Claudene Victory ORN, MD (Inactive) as PCP - Cardiology (Cardiology) Maree Paticia BRAVO, MD as Referring Physician (Ophthalmology) Meylor, Addie, DC as Consulting Physician (Chiropractic Medicine) Roslynn Silversmith, DDS as Consulting Physician (Dentistry)  I have personally reviewed and noted the  following in the patients chart:   Medical and social history Use of alcohol , tobacco or illicit drugs  Current medications and supplements including opioid prescriptions. Functional ability and status Nutritional status Physical activity Advanced directives List of other physicians Hospitalizations, surgeries, and ER visits in previous 12 months Vitals Screenings to include cognitive, depression, and falls Referrals and appointments  No orders of the defined types were placed in this encounter.  In addition, I have reviewed and discussed with patient certain preventive protocols, quality metrics, and best practice recommendations. A written personalized care plan for preventive services as well as general preventive health recommendations were provided to patient.   [facility] FORBES Dawn, LPN   [id number]   Return in 1 year (on 03/15/2025).  After Visit Summary: (MyChart) Due to this being a telephonic visit, the after visit summary with patients personalized plan was offered to patient via MyChart   Nurse Notes: Vaccines not given: shingles declined today  "

## 2024-03-16 ENCOUNTER — Other Ambulatory Visit: Payer: Self-pay | Admitting: Family Medicine

## 2024-03-16 ENCOUNTER — Ambulatory Visit: Payer: Medicare PPO

## 2024-03-29 ENCOUNTER — Other Ambulatory Visit: Payer: Self-pay | Admitting: Family Medicine

## 2024-03-29 DIAGNOSIS — E039 Hypothyroidism, unspecified: Secondary | ICD-10-CM

## 2024-04-20 ENCOUNTER — Encounter: Admitting: Family Medicine

## 2025-03-18 ENCOUNTER — Ambulatory Visit
